# Patient Record
Sex: Male | Born: 1955 | Race: Black or African American | Hispanic: No | Smoking: Never smoker
Health system: Southern US, Community
[De-identification: ages and names within clinical notes are randomized; demographics above are authoritative.]

## PROBLEM LIST (undated history)

## (undated) DIAGNOSIS — K219 Gastro-esophageal reflux disease without esophagitis: Secondary | ICD-10-CM

## (undated) DIAGNOSIS — E119 Type 2 diabetes mellitus without complications: Secondary | ICD-10-CM

## (undated) DIAGNOSIS — I10 Essential (primary) hypertension: Secondary | ICD-10-CM

## (undated) DIAGNOSIS — I639 Cerebral infarction, unspecified: Secondary | ICD-10-CM

## (undated) HISTORY — DX: Type 2 diabetes mellitus without complications: E11.9

## (undated) HISTORY — DX: Gastro-esophageal reflux disease without esophagitis: K21.9

## (undated) HISTORY — DX: Essential (primary) hypertension: I10

---

## 2008-06-25 ENCOUNTER — Emergency Department: Payer: Self-pay | Admitting: Emergency Medicine

## 2015-10-20 DIAGNOSIS — I10 Essential (primary) hypertension: Secondary | ICD-10-CM | POA: Insufficient documentation

## 2016-09-07 IMAGING — US US RENAL
1 series · 14 of 25 positions shown · non-contrast
Comparison: None.

CLINICAL DATA: Patient with microscopic hematuria.

EXAM:
RENAL / URINARY TRACT ULTRASOUND COMPLETE

[Series 1: us renal · 0.26mm/px · 14 of 38 slices shown]
[im 1/38]
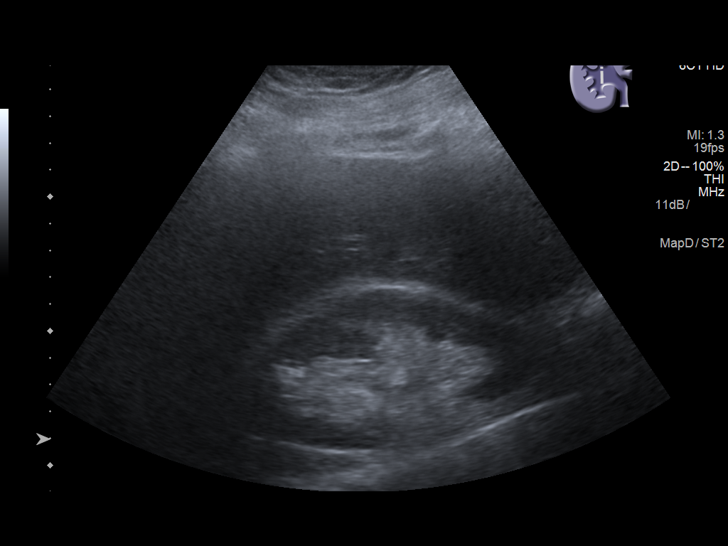
[im 4/38]
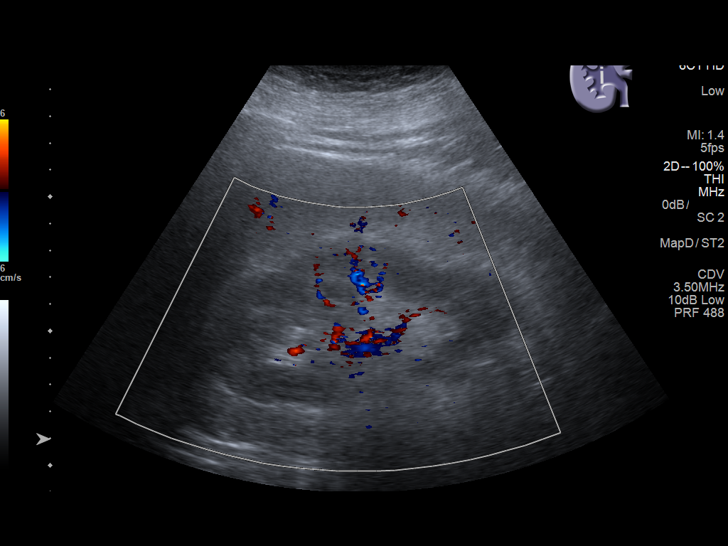
[im 7/38]
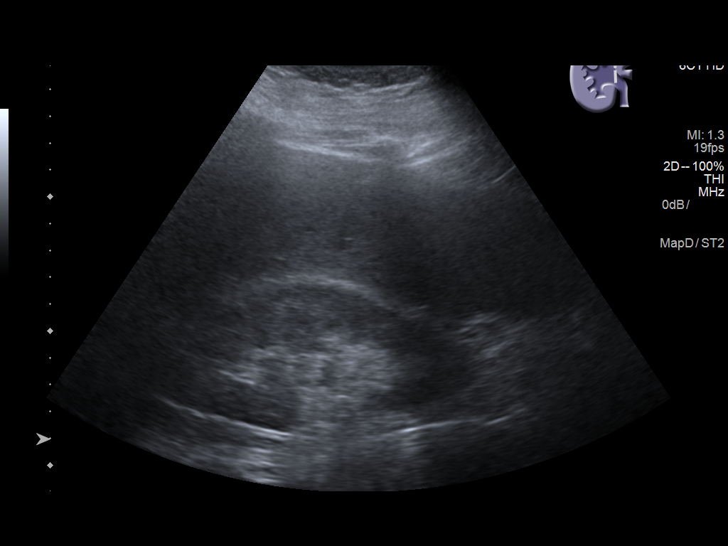
[im 10/38]
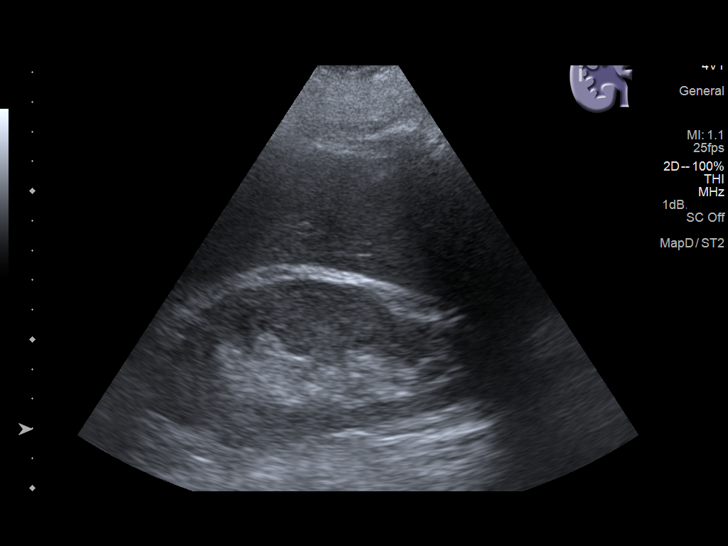
[im 13/38]
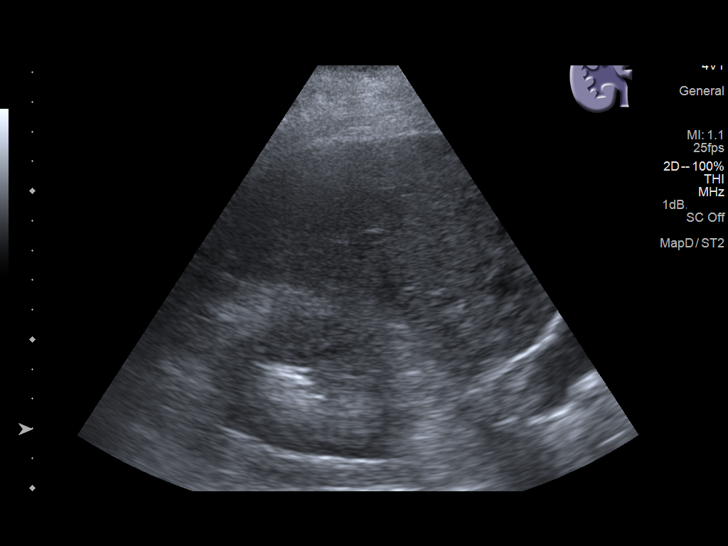
[im 14/38]
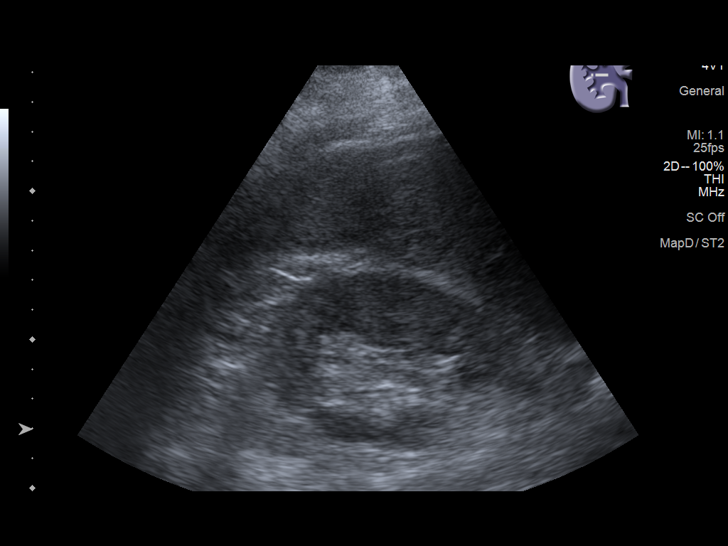
[im 17/38]
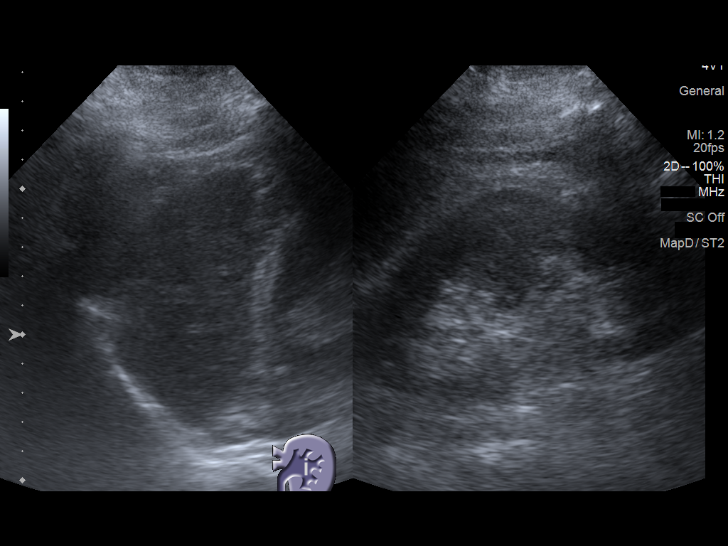
[im 21/38]
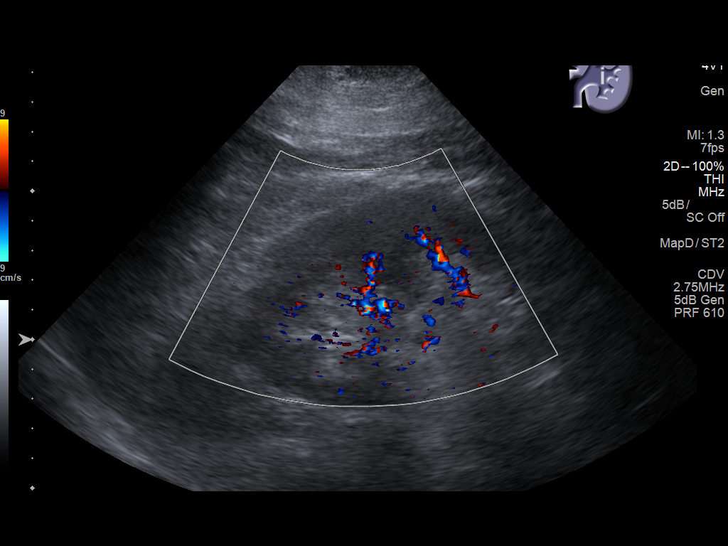
[im 24/38]
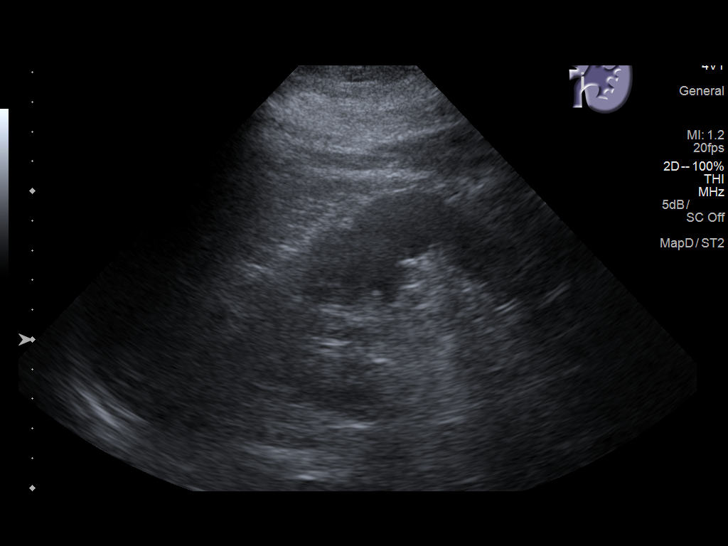
[im 25/38]
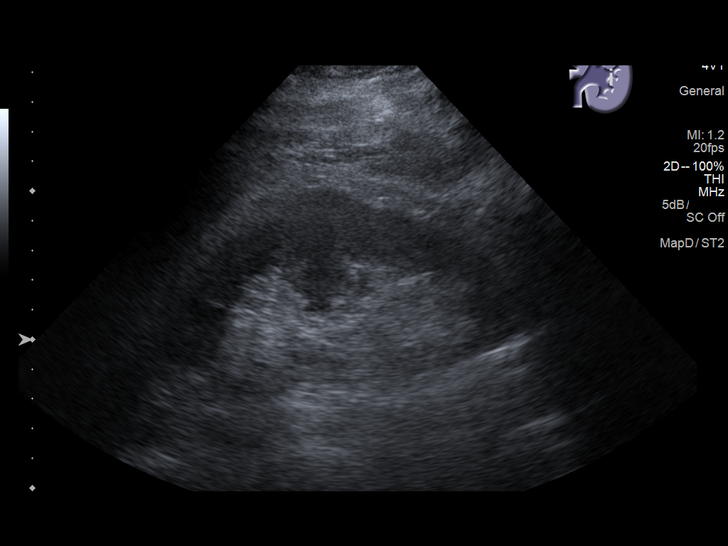
[im 28/38]
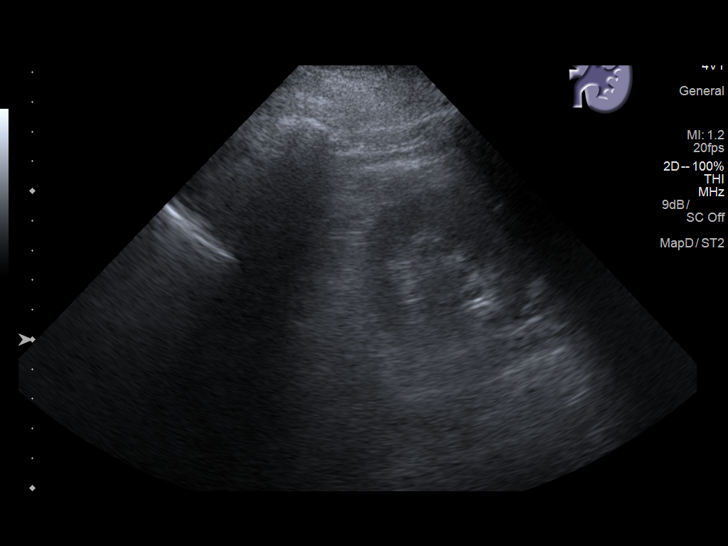
[im 31/38]
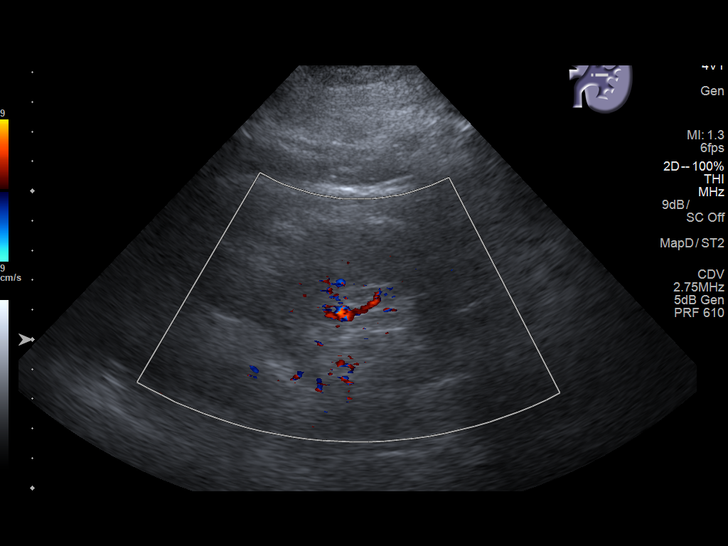
[im 34/38]
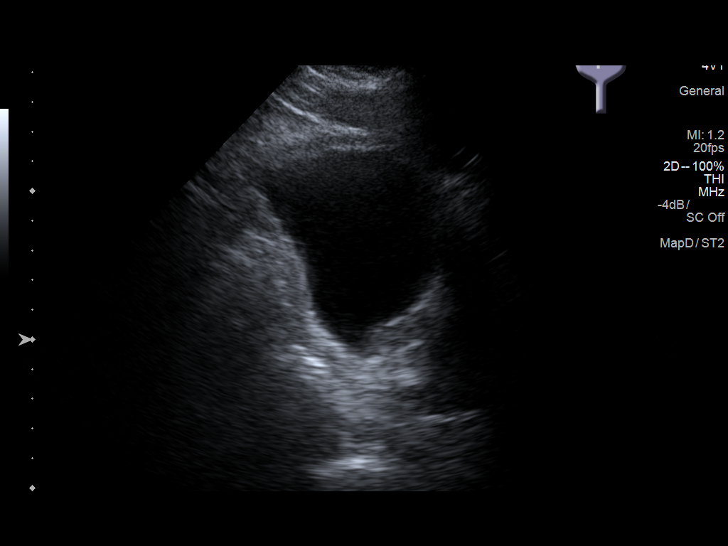
[im 38/38]
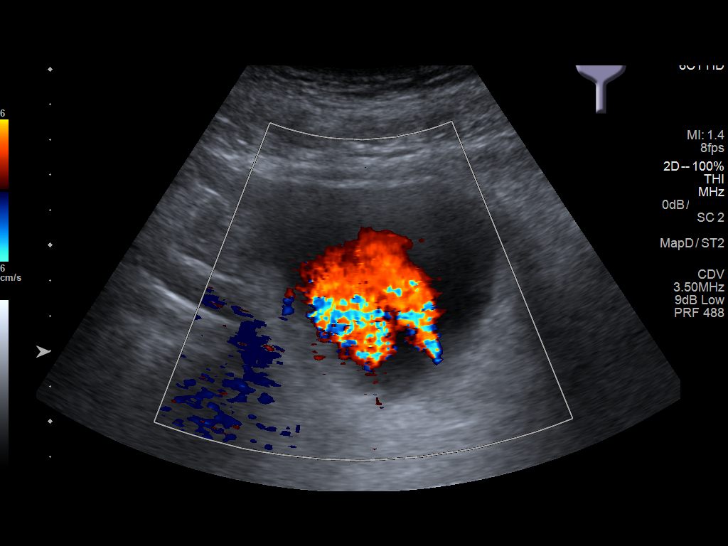

[14 of 25 positions shown; findings below may reference images not displayed]

FINDINGS: Right Kidney:

Length: 12.3 cm. Echogenicity within normal limits. No mass or
hydronephrosis visualized.

Left Kidney:

Length: 13.6 cm. Echogenicity within normal limits. No mass or
hydronephrosis visualized.

Bladder:

Appears normal for degree of bladder distention.
IMPRESSION: No hydronephrosis.

## 2016-11-28 DIAGNOSIS — E119 Type 2 diabetes mellitus without complications: Secondary | ICD-10-CM | POA: Insufficient documentation

## 2016-11-28 DIAGNOSIS — E1169 Type 2 diabetes mellitus with other specified complication: Secondary | ICD-10-CM | POA: Insufficient documentation

## 2016-12-07 ENCOUNTER — Ambulatory Visit: Payer: Self-pay

## 2016-12-10 ENCOUNTER — Ambulatory Visit (INDEPENDENT_AMBULATORY_CARE_PROVIDER_SITE_OTHER): Payer: PRIVATE HEALTH INSURANCE | Admitting: Urology

## 2016-12-10 ENCOUNTER — Encounter: Payer: Self-pay | Admitting: Urology

## 2016-12-10 VITALS — BP 157/94 | HR 84 | Ht 72.0 in | Wt 242.0 lb

## 2016-12-10 DIAGNOSIS — R972 Elevated prostate specific antigen [PSA]: Secondary | ICD-10-CM | POA: Diagnosis not present

## 2016-12-10 DIAGNOSIS — R3129 Other microscopic hematuria: Secondary | ICD-10-CM | POA: Insufficient documentation

## 2016-12-10 LAB — MICROSCOPIC EXAMINATION
Epithelial Cells (non renal): NONE SEEN /hpf (ref 0–10)
WBC UA: NONE SEEN /HPF (ref 0–?)

## 2016-12-10 LAB — URINALYSIS, COMPLETE
BILIRUBIN UA: NEGATIVE
GLUCOSE, UA: NEGATIVE
Leukocytes, UA: NEGATIVE
NITRITE UA: NEGATIVE
SPEC GRAV UA: 1.025 (ref 1.005–1.030)
UUROB: 1 mg/dL (ref 0.2–1.0)
pH, UA: 5 (ref 5.0–7.5)

## 2016-12-10 MED ORDER — TAMSULOSIN HCL 0.4 MG PO CAPS: 0.4000 mg | ORAL_CAPSULE | Freq: Every day | ORAL | 0 refills | Status: DC

## 2016-12-10 NOTE — Progress Notes (Signed)
12/10/2016 11:21 AM   Raymond Kerr Eyman Jul 04, 1955 [id number]  Referring provider: Barbette ReichmannHande, Vishwanath, MD [address] [facility] [place], [place] [id number]  Chief Complaint  Patient presents with  . Elevated PSA    HPI: Raymond StacksCurtis Gjerde is a 61 year old male AA seen in consultation at the request of Dr. Marcello FennelHande for evaluation of an elevated PSA.  A PSA drawn on 11/29/2016 was elevated at 5.52.  Previous PSA August 2017 was 2.50.  He denies previous history of urologic problems or prior urologic evaluation.  He has mild lower urinary tract symptoms consisting of rare urinary frequency, decreased stream and nocturia x1.  IPSS completed today was 3/35 with a QOL rated 2/6.  Denies dysuria or gross hematuria.  Denies flank, abdominal, pelvic or scrotal pain.  There is no family history of prostate cancer.  He rates the severity of his problem as mild.   PMH: Past Medical History:  Diagnosis Date  . Diabetes mellitus without complication (HCC)   . GERD (gastroesophageal reflux disease)   . Hypertension     Surgical History: No past surgical history on file.  Home Medications:  Allergies as of 12/10/2016   No Known Allergies     Medication List       Accurate as of 12/10/16 11:21 AM. Always use your most recent med list.          glimepiride 4 MG tablet Commonly known as:  AMARYL Take by mouth.   losartan 100 MG tablet Commonly known as:  COZAAR Take by mouth.   metFORMIN 1000 MG tablet Commonly known as:  GLUCOPHAGE Take by mouth.   omeprazole 20 MG capsule Commonly known as:  PRILOSEC Take by mouth.   tamsulosin 0.4 MG Caps capsule Commonly known as:  FLOMAX Take 1 capsule (0.4 mg total) by mouth daily.       Allergies: No Known Allergies  Family History: Family History  Problem Relation Age of Onset  . Prostate cancer Neg Hx   . Bladder Cancer Neg Hx   . Kidney cancer Neg Hx     Social History:  reports that he has never smoked. He has  never used smokeless tobacco. He reports that he drinks alcohol. He reports that he does not use drugs.  ROS: UROLOGY Frequent Urination?: Yes Hard to postpone urination?: No Burning/pain with urination?: No Get up at night to urinate?: Yes Leakage of urine?: No Urine stream starts and stops?: No Trouble starting stream?: No Do you have to strain to urinate?: No Blood in urine?: No Urinary tract infection?: No Sexually transmitted disease?: No Injury to kidneys or bladder?: No Painful intercourse?: No Weak stream?: No Erection problems?: No Penile pain?: Yes  Gastrointestinal Nausea?: No Vomiting?: No Indigestion/heartburn?: No Diarrhea?: No Constipation?: No  Constitutional Fever: No Night sweats?: No Weight loss?: No Fatigue?: No  Skin Skin rash/lesions?: No Itching?: No  Eyes Blurred vision?: No Double vision?: No  Ears/Nose/Throat Sore throat?: No Sinus problems?: No  Hematologic/Lymphatic Swollen glands?: No Easy bruising?: No  Cardiovascular Leg swelling?: No Chest pain?: No  Respiratory Cough?: No Shortness of breath?: No  Endocrine Excessive thirst?: No  Musculoskeletal Back pain?: No Joint pain?: No  Neurological Headaches?: No Dizziness?: No  Psychologic Depression?: No Anxiety?: No  Physical Exam: BP (!) 157/94 (BP Location: Right Arm, Patient Position: Sitting, Cuff Size: Normal)   Pulse 84   Ht 6' (1.829 m)   Wt 242 lb (109.8 kg)   BMI 32.82 kg/m  Constitutional:  Alert and oriented, No acute distress. HEENT: [place] AT, moist mucus membranes.  Trachea midline, no masses. Cardiovascular: No clubbing, cyanosis, or edema. Respiratory: Normal respiratory effort, no increased work of breathing. GI: Abdomen is soft, nontender, nondistended, no abdominal masses GU: No CVA tenderness.  Penis circumcised without lesions; testes descended bilaterally without masses or tenderness; cords/epididymes palpably normal.  No paratesticular  abnormalities.  He refused a prostate exam. Skin: No rashes, bruises or suspicious lesions. Lymph: No cervical or inguinal adenopathy. Neurologic: Grossly intact, no focal deficits, moving all 4 extremities. Psychiatric: Normal mood and affect.  Laboratory Data: PSA 11/29/2016 5.52  Urinalysis Negative glucose, trace ketones, 2+ blood, 2+ protein, nitrite negative, LEST negative Microscopy: 3-10 RBC/0 WBC Pertinent Imaging: N/A  Assessment & Plan:    1. Elevated PSA  Although PSA is a prostate cancer screening test he was informed that cancer is not the most common cause of an elevated PSA. Other potential causes including BPH and inflammation were discussed. He was informed that the only way to adequately diagnose prostate cancer would be a transrectal ultrasound and biopsy of the prostate. The procedure was discussed including potential risks of bleeding and infection/sepsis. He was also informed that a negative biopsy does not conclusively rule out the possibility that prostate cancer may be present and that continued monitoring is required. The use of newer adjunctive blood tests including PHI and 4kScore were discussed. The use of multiparametric prostate MRI was also discussed however is not typically used for initial evaluation of an elevated PSA. Continued periodic surveillance was also discussed.  Since his PSA was normal approximately 1 year ago I have recommended an empiric alpha blocker course with a follow-up PSA in approximately 1 month.  Rx tamsulosin 0.4 mg daily was sent to his pharmacy  Follow-up 5 weeks for review of results and he indicated he would have a DRE at that visit.     - Urinalysis, Complete - PSA; Future  2. Microhematuria  Urinalysis today showed 3-10 RBCs.  His recent urinalysis at [facility] was unremarkable.  He was informed urinalysis today remarkable for clinically significant microhematuria.  Have recommended a renal ultrasound and cystoscopy  for further evaluation.  - Ultrasound renal complete; Future   Return in about 5 weeks (around 01/14/2017).   I was informed by clinic staff when patient checked out he refused to schedule the renal ultrasound or a follow-up appointment and indicated he would call back for the appointment.    Riki Altes, MD  [facility] [address], [address] [place], [place] [phone]

## 2017-01-27 ENCOUNTER — Telehealth: Payer: Self-pay | Admitting: Urology

## 2017-01-27 NOTE — Telephone Encounter (Signed)
Scheduling has tried to reach this patient to schedule his RUS and he has not retuerned their calls. They have mailed him a letter to call and schedule. Just wanted you to know.  Marcelino DusterMichelle

## 2017-02-14 ENCOUNTER — Ambulatory Visit
Admission: RE | Admit: 2017-02-14 | Discharge: 2017-02-14 | Disposition: A | Discharge status: Home / Self Care | Payer: Self-pay | Source: Ambulatory Visit | Attending: Urology | Admitting: Urology

## 2017-02-14 DIAGNOSIS — R3129 Other microscopic hematuria: Secondary | ICD-10-CM | POA: Insufficient documentation

## 2017-02-16 ENCOUNTER — Telehealth: Payer: Self-pay

## 2017-02-16 NOTE — Telephone Encounter (Signed)
-----   Message from Riki AltesScott C Stoioff, MD sent at 02/15/2017  8:29 AM EST ----- Renal ultrasound was normal.  He has not had his PSA repeated and is due

## 2017-02-16 NOTE — Telephone Encounter (Signed)
Notified on vmail 

## 2019-06-04 ENCOUNTER — Ambulatory Visit: Payer: Self-pay

## 2019-06-04 ENCOUNTER — Ambulatory Visit: Payer: Self-pay | Attending: Internal Medicine

## 2019-06-04 DIAGNOSIS — Z23 Encounter for immunization: Secondary | ICD-10-CM

## 2019-06-04 NOTE — Progress Notes (Signed)
   Covid-19 Vaccination Clinic  Name:  Raymond Kerr    MRN: [id number] DOB: [birth year]  06/04/2019  Raymond Kerr was observed post Covid-19 immunization for 15 minutes without incident. He was provided with Vaccine Information Sheet and instruction to access the V-Safe system.   Raymond Kerr was instructed to call 911 with any severe reactions post vaccine: Marland Kitchen Difficulty breathing  . Swelling of face and throat  . A fast heartbeat  . A bad rash all over body  . Dizziness and weakness   Immunizations Administered    Name Date Dose VIS Date Route   Pfizer COVID-19 Vaccine 06/04/2019 10:23 AM 0.3 mL 02/02/2019 Intramuscular   Manufacturer: [employer], [employer]   Lot: [id number]   NDC: [address]-9

## 2019-06-26 ENCOUNTER — Ambulatory Visit: Payer: Self-pay | Attending: Internal Medicine

## 2019-06-26 DIAGNOSIS — Z23 Encounter for immunization: Secondary | ICD-10-CM

## 2019-06-26 NOTE — Progress Notes (Signed)
   Covid-19 Vaccination Clinic  Name:  Raymond Kerr    MRN: [id number] DOB: [birth year]  06/26/2019  Mr. Raymond Kerr was observed post Covid-19 immunization for 15 minutes without incident. He was provided with Vaccine Information Sheet and instruction to access the V-Safe system.   Mr. Raymond Kerr was instructed to call 911 with any severe reactions post vaccine: Marland Kitchen Difficulty breathing  . Swelling of face and throat  . A fast heartbeat  . A bad rash all over body  . Dizziness and weakness   Immunizations Administered    Name Date Dose VIS Date Route   Pfizer COVID-19 Vaccine 06/26/2019  2:00 PM 0.3 mL 04/18/2018 Intramuscular   Manufacturer: [employer], [employer]   Lot: [id number]   NDC: [address]-0

## 2020-10-05 ENCOUNTER — Emergency Department: Payer: Medicaid Other

## 2020-10-05 ENCOUNTER — Encounter: Payer: Self-pay | Admitting: Emergency Medicine

## 2020-10-05 ENCOUNTER — Other Ambulatory Visit: Payer: Self-pay

## 2020-10-05 DIAGNOSIS — I214 Non-ST elevation (NSTEMI) myocardial infarction: Secondary | ICD-10-CM | POA: Diagnosis present

## 2020-10-05 DIAGNOSIS — R471 Dysarthria and anarthria: Secondary | ICD-10-CM | POA: Diagnosis present

## 2020-10-05 DIAGNOSIS — E1165 Type 2 diabetes mellitus with hyperglycemia: Secondary | ICD-10-CM | POA: Diagnosis present

## 2020-10-05 DIAGNOSIS — E785 Hyperlipidemia, unspecified: Secondary | ICD-10-CM | POA: Diagnosis present

## 2020-10-05 DIAGNOSIS — R29810 Facial weakness: Secondary | ICD-10-CM | POA: Diagnosis present

## 2020-10-05 DIAGNOSIS — I63442 Cerebral infarction due to embolism of left cerebellar artery: Principal | ICD-10-CM | POA: Diagnosis present

## 2020-10-05 DIAGNOSIS — I10 Essential (primary) hypertension: Secondary | ICD-10-CM | POA: Diagnosis present

## 2020-10-05 DIAGNOSIS — K219 Gastro-esophageal reflux disease without esophagitis: Secondary | ICD-10-CM | POA: Diagnosis present

## 2020-10-05 DIAGNOSIS — E861 Hypovolemia: Secondary | ICD-10-CM | POA: Diagnosis not present

## 2020-10-05 DIAGNOSIS — R1312 Dysphagia, oropharyngeal phase: Secondary | ICD-10-CM | POA: Diagnosis present

## 2020-10-05 DIAGNOSIS — R4701 Aphasia: Secondary | ICD-10-CM | POA: Diagnosis present

## 2020-10-05 DIAGNOSIS — R29705 NIHSS score 5: Secondary | ICD-10-CM | POA: Diagnosis present

## 2020-10-05 DIAGNOSIS — Z20822 Contact with and (suspected) exposure to covid-19: Secondary | ICD-10-CM | POA: Diagnosis present

## 2020-10-05 DIAGNOSIS — G8194 Hemiplegia, unspecified affecting left nondominant side: Secondary | ICD-10-CM | POA: Diagnosis present

## 2020-10-05 DIAGNOSIS — H02402 Unspecified ptosis of left eyelid: Secondary | ICD-10-CM | POA: Diagnosis present

## 2020-10-05 DIAGNOSIS — H4902 Third [oculomotor] nerve palsy, left eye: Secondary | ICD-10-CM | POA: Diagnosis present

## 2020-10-05 DIAGNOSIS — E871 Hypo-osmolality and hyponatremia: Secondary | ICD-10-CM | POA: Diagnosis not present

## 2020-10-05 DIAGNOSIS — E876 Hypokalemia: Secondary | ICD-10-CM | POA: Diagnosis present

## 2020-10-05 DIAGNOSIS — L89152 Pressure ulcer of sacral region, stage 2: Secondary | ICD-10-CM | POA: Diagnosis present

## 2020-10-05 DIAGNOSIS — I959 Hypotension, unspecified: Secondary | ICD-10-CM | POA: Diagnosis present

## 2020-10-05 LAB — DIFFERENTIAL
Abs Immature Granulocytes: 0.02 10*3/uL (ref 0.00–0.07)
Basophils Absolute: 0 10*3/uL (ref 0.0–0.1)
Basophils Relative: 0 %
Eosinophils Absolute: 0.1 10*3/uL (ref 0.0–0.5)
Eosinophils Relative: 2 %
Immature Granulocytes: 0 %
Lymphocytes Relative: 36 %
Lymphs Abs: 1.9 10*3/uL (ref 0.7–4.0)
Monocytes Absolute: 0.3 10*3/uL (ref 0.1–1.0)
Monocytes Relative: 6 %
Neutro Abs: 2.9 10*3/uL (ref 1.7–7.7)
Neutrophils Relative %: 56 %

## 2020-10-05 LAB — CBG MONITORING, ED: Glucose-Capillary: 303 mg/dL — ABNORMAL HIGH (ref 70–99)

## 2020-10-05 LAB — COMPREHENSIVE METABOLIC PANEL
ALT: 14 U/L (ref 0–44)
AST: 27 U/L (ref 15–41)
Albumin: 3.3 g/dL — ABNORMAL LOW (ref 3.5–5.0)
Alkaline Phosphatase: 70 U/L (ref 38–126)
Anion gap: 8 (ref 5–15)
BUN: 14 mg/dL (ref 8–23)
CO2: 27 mmol/L (ref 22–32)
Calcium: 9.1 mg/dL (ref 8.9–10.3)
Chloride: 100 mmol/L (ref 98–111)
Creatinine, Ser: 1.07 mg/dL (ref 0.61–1.24)
GFR, Estimated: >60 mL/min (ref >60)
Glucose, Bld: 260 mg/dL — ABNORMAL HIGH (ref 70–99)
Potassium: 3.7 mmol/L (ref 3.5–5.1)
Sodium: 135 mmol/L (ref 135–145)
Total Bilirubin: 0.6 mg/dL (ref 0.3–1.2)
Total Protein: 7.4 g/dL (ref 6.5–8.1)

## 2020-10-05 LAB — CBC
HCT: 40.5 % (ref 39.0–52.0)
Hemoglobin: 13.2 g/dL (ref 13.0–17.0)
MCH: 29.1 pg (ref 26.0–34.0)
MCHC: 32.6 g/dL (ref 30.0–36.0)
MCV: 89.4 fL (ref 80.0–100.0)
Platelets: 277 10*3/uL (ref 150–400)
RBC: 4.53 MIL/uL (ref 4.22–5.81)
RDW: 14.3 % (ref 11.5–15.5)
WBC: 5.3 10*3/uL (ref 4.0–10.5)
nRBC: 0 % (ref 0.0–0.2)

## 2020-10-05 LAB — APTT: aPTT: 29 seconds (ref 24–36)

## 2020-10-05 LAB — PROTIME-INR
INR: 1 (ref 0.8–1.2)
Prothrombin Time: 13 seconds (ref 11.4–15.2)

## 2020-10-05 LAB — TROPONIN I (HIGH SENSITIVITY): Troponin I (High Sensitivity): 62 ng/L — ABNORMAL HIGH (ref <18)

## 2020-10-05 IMAGING — CT CT HEAD W/O CM
3 of 4 series · 15 of 47 positions shown, 18 images · non-contrast
Comparison: None.

CLINICAL DATA: Neuro deficit, acute, stroke suspected. Slurred
speech and double vision for the past 8 hours.

EXAM:
CT HEAD WITHOUT CONTRAST
TECHNIQUE: Contiguous axial images were obtained from the base of the skull
through the vertex without intravenous contrast.

[Series 2: head wo · axial · 0.47mm/px · z∈[-189,-39]mm · 9 of 36 slices shown, 12 images]
[im 3/36  brain]
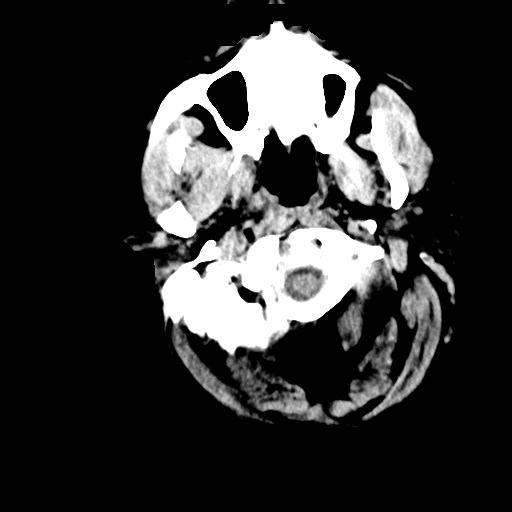
[im 3/36  bone]
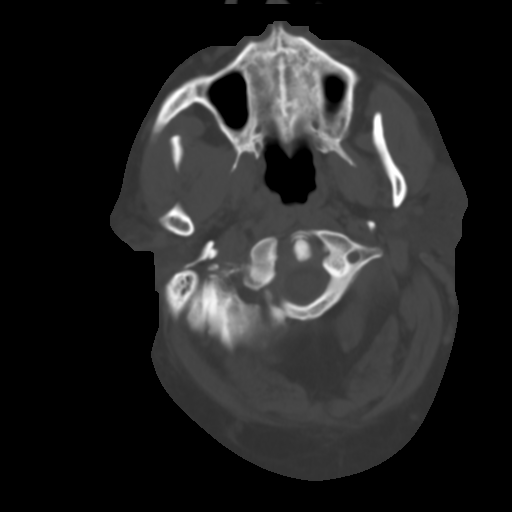
[im 8/36  brain]
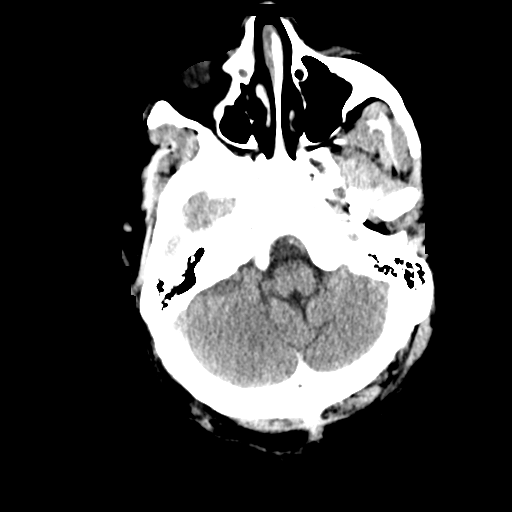
[im 11/36  brain]
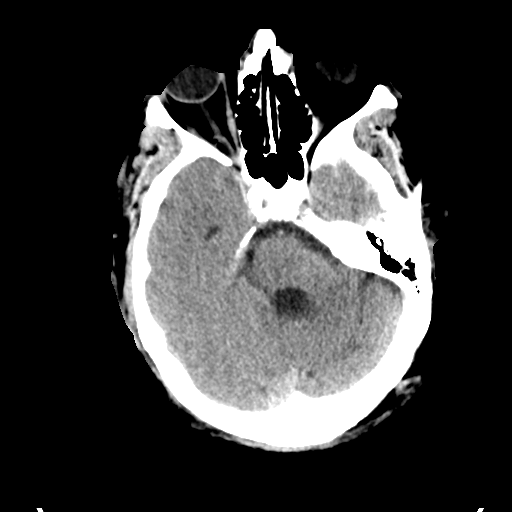
[im 16/36  brain]
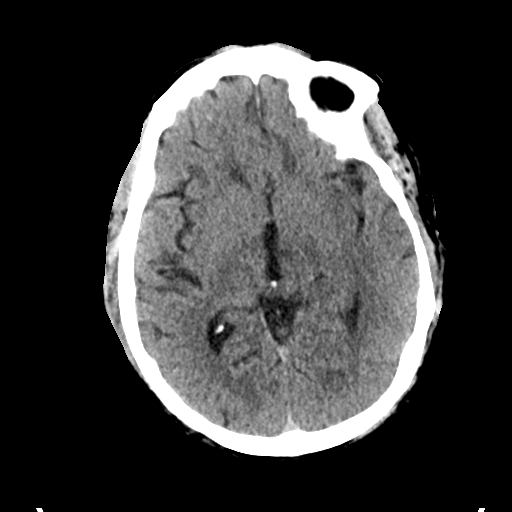
[im 18/36  brain]
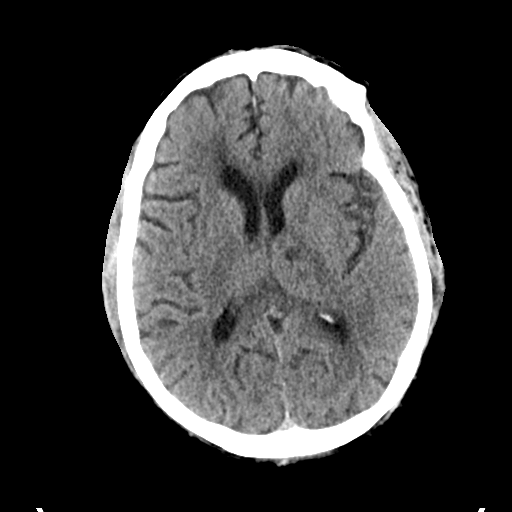
[im 18/36  bone]
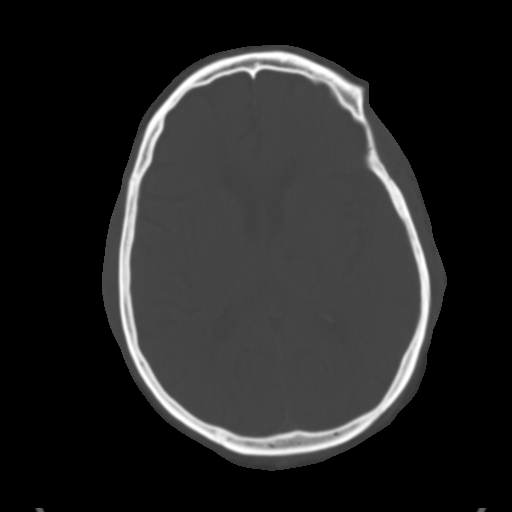
[im 21/36  brain]
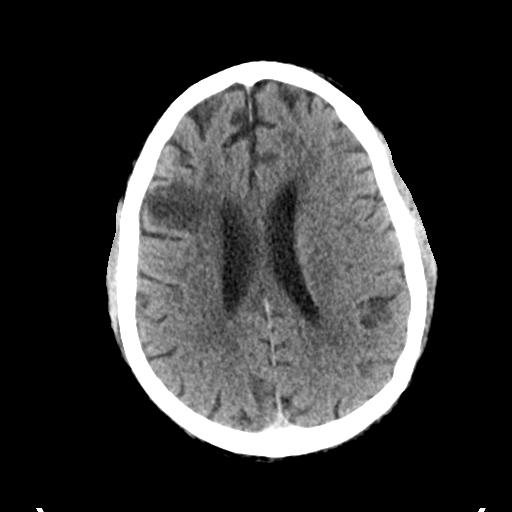
[im 26/36  brain]
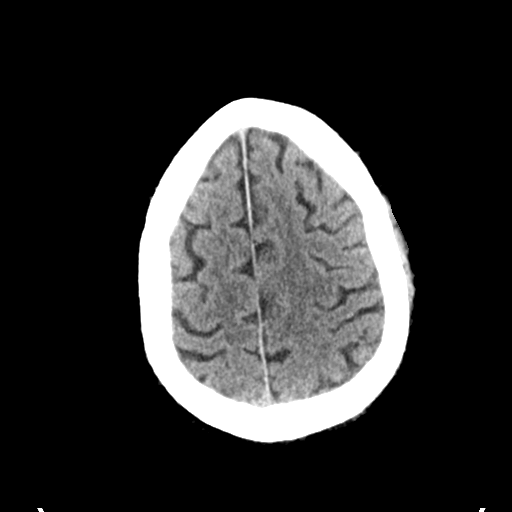
[im 28/36  brain]
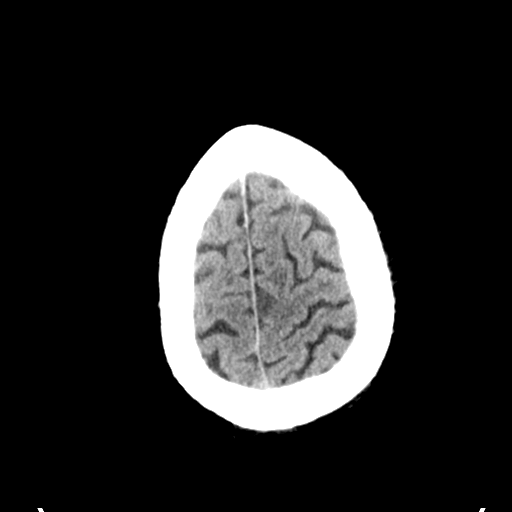
[im 33/36  brain]
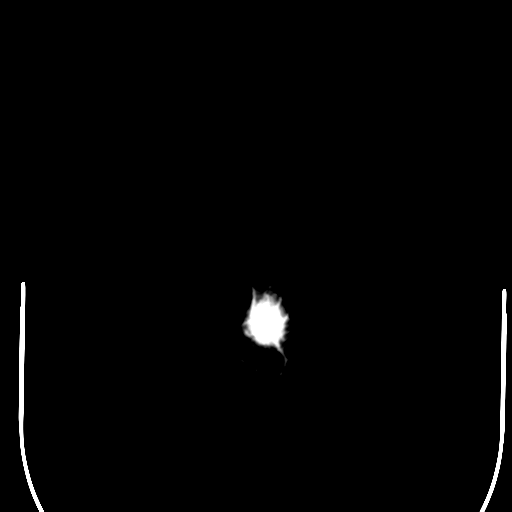
[im 33/36  bone]
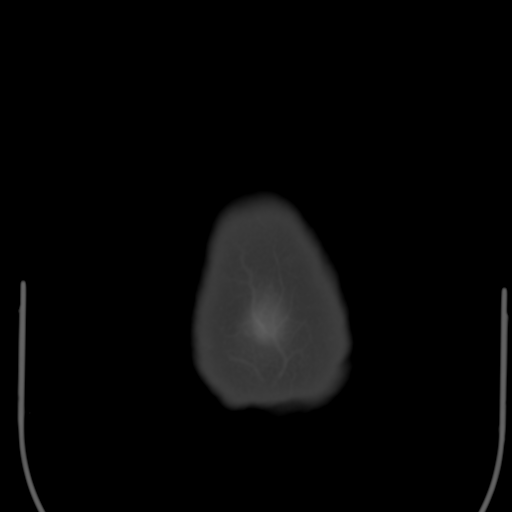

[Series 4: coronal soft tissue · coronal · 0.37mm/px · 3 of 84 slices shown]
[im 28/84  brain]
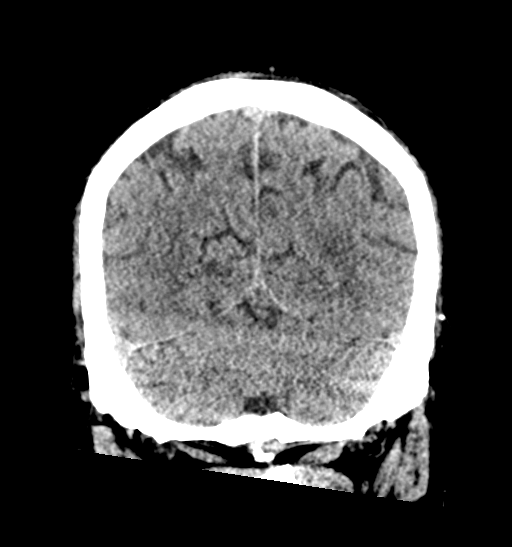
[im 37/84  brain]
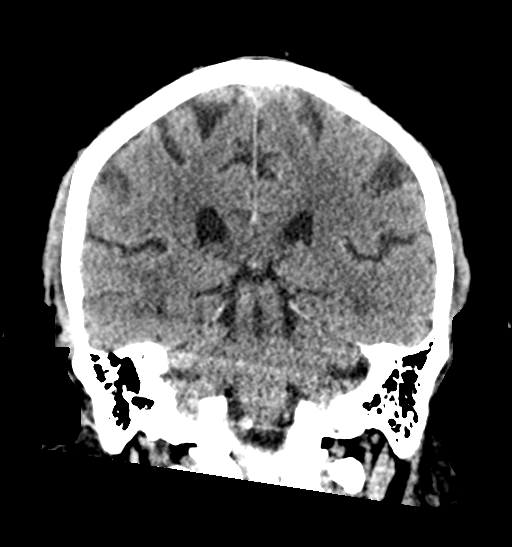
[im 47/84  brain]
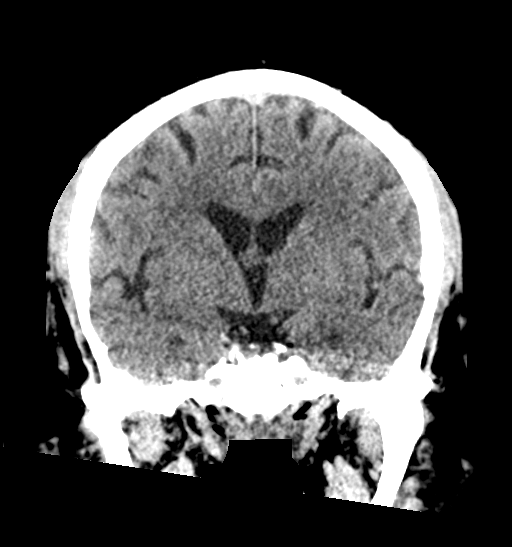

[Series 5: sagittal soft tissue · sagittal · 0.34mm/px · 3 of 65 slices shown]
[im 26/65  brain]
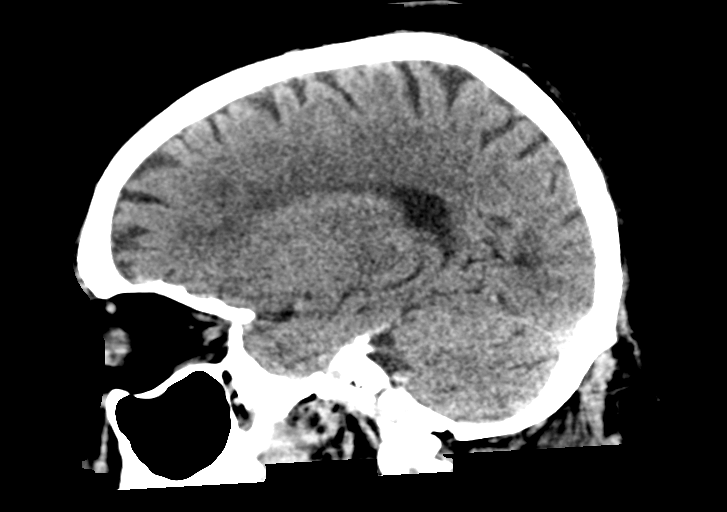
[im 33/65  brain]
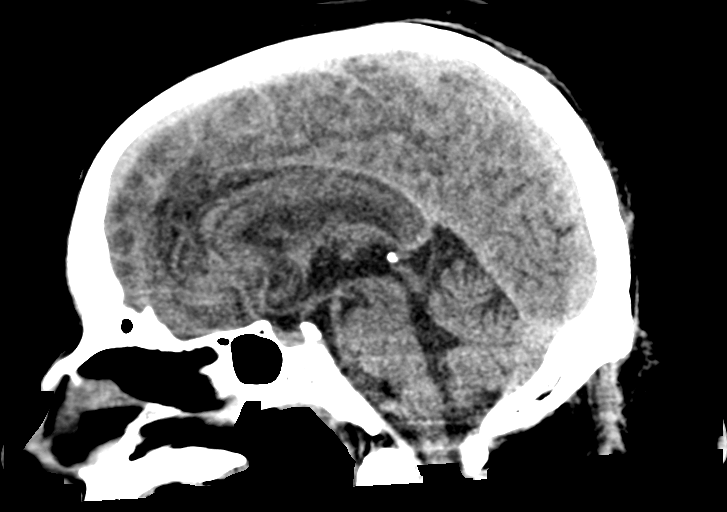
[im 39/65  brain]
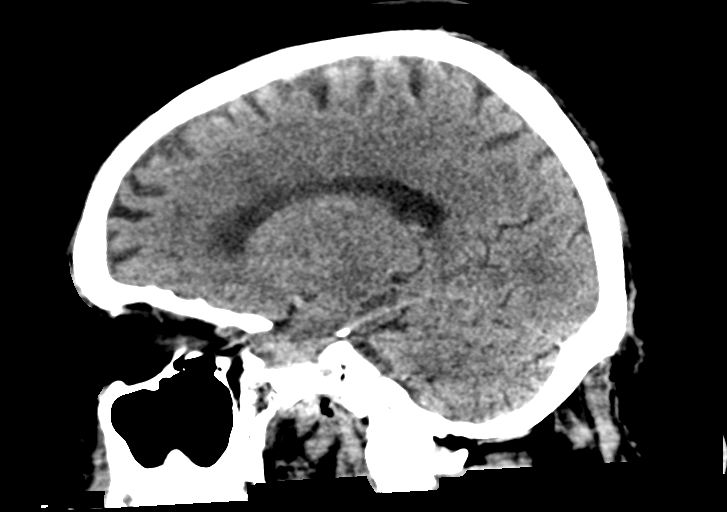

[15 of 47 positions shown; findings below may reference images not displayed]

FINDINGS: Brain: Chronic small vessel ischemic changes within the bilateral
periventricular white matter regions. Small chronic-appearing
infarct within the LEFT cerebellum.

Additional low-density areas within the LEFT thalamus and RIGHT
frontal lobe, compatible with subacute to chronic infarcts, favor
chronic.

No parenchymal mass or hemorrhage. No mass effect, midline shift or
herniation seen.

Vascular: Chronic calcified atherosclerotic changes of the large
vessels at the skull base. No unexpected hyperdense vessel.

Skull: Normal. Negative for fracture or focal lesion.

Sinuses/Orbits: No acute finding.

Other: None.
IMPRESSION: 1. Low-density areas within the LEFT thalamus and RIGHT frontal
lobe, compatible with subacute to chronic infarcts, favor chronic.
2. Additional chronic ischemic changes within the white matter
regions and LEFT cerebellum.
3. No intracranial hemorrhage. No significant mass effect, midline
shift or herniation.

## 2020-10-05 NOTE — ED Notes (Signed)
D/w Dr. Scotty Court pt's CT results no additional nursing orders at this time.

## 2020-10-05 NOTE — ED Triage Notes (Addendum)
Pt arrived via POV from [place] with a friend, reports pt has had slurred speech and double vision for the past 8 hours while traveling, friend who drove the patient here states they stopped at a hotel and called 911 who checked the patient's blood sugar.  On arrival, pt reports double vision and dizziness.  Pt is a diabetic CBG 303.   Denies any pain, speech is somewhat slurred. No aphasia noted, bilateral equal grip strengths.   Pt is weak as well.   Pt's friend who brought him said the patient was losing his vision, pt denies this at this time.   Denies any HA, not on any blood thinners.

## 2020-10-06 ENCOUNTER — Emergency Department: Payer: Medicaid Other

## 2020-10-06 ENCOUNTER — Inpatient Hospital Stay
Admission: EM | Admit: 2020-10-06 | Discharge: 2020-10-24 | DRG: 064 | Disposition: A | Discharge status: Home / Self Care | Payer: Medicaid Other | Attending: Internal Medicine | Admitting: Internal Medicine

## 2020-10-06 ENCOUNTER — Encounter: Payer: Self-pay | Admitting: Family Medicine

## 2020-10-06 ENCOUNTER — Observation Stay
Admit: 2020-10-06 | Discharge: 2020-10-06 | Disposition: A | Discharge status: Home / Self Care | Payer: Medicaid Other | Attending: Family Medicine | Admitting: Family Medicine

## 2020-10-06 DIAGNOSIS — I959 Hypotension, unspecified: Secondary | ICD-10-CM

## 2020-10-06 DIAGNOSIS — E1165 Type 2 diabetes mellitus with hyperglycemia: Secondary | ICD-10-CM

## 2020-10-06 DIAGNOSIS — G459 Transient cerebral ischemic attack, unspecified: Secondary | ICD-10-CM

## 2020-10-06 DIAGNOSIS — L8990 Pressure ulcer of unspecified site, unspecified stage: Secondary | ICD-10-CM | POA: Insufficient documentation

## 2020-10-06 DIAGNOSIS — I10 Essential (primary) hypertension: Secondary | ICD-10-CM

## 2020-10-06 DIAGNOSIS — I63522 Cerebral infarction due to unspecified occlusion or stenosis of left anterior cerebral artery: Secondary | ICD-10-CM

## 2020-10-06 DIAGNOSIS — I639 Cerebral infarction, unspecified: Secondary | ICD-10-CM

## 2020-10-06 DIAGNOSIS — I63542 Cerebral infarction due to unspecified occlusion or stenosis of left cerebellar artery: Secondary | ICD-10-CM | POA: Diagnosis present

## 2020-10-06 DIAGNOSIS — H4902 Third [oculomotor] nerve palsy, left eye: Secondary | ICD-10-CM

## 2020-10-06 DIAGNOSIS — R4702 Dysphasia: Secondary | ICD-10-CM

## 2020-10-06 DIAGNOSIS — R531 Weakness: Secondary | ICD-10-CM

## 2020-10-06 DIAGNOSIS — I214 Non-ST elevation (NSTEMI) myocardial infarction: Secondary | ICD-10-CM

## 2020-10-06 DIAGNOSIS — R778 Other specified abnormalities of plasma proteins: Secondary | ICD-10-CM

## 2020-10-06 DIAGNOSIS — R739 Hyperglycemia, unspecified: Secondary | ICD-10-CM

## 2020-10-06 DIAGNOSIS — E876 Hypokalemia: Secondary | ICD-10-CM

## 2020-10-06 LAB — LIPID PANEL
Cholesterol: 199 mg/dL (ref 0–200)
Cholesterol: 174 mg/dL (ref 0–200)
HDL: 35 mg/dL — ABNORMAL LOW (ref >40)
HDL: 30 mg/dL — ABNORMAL LOW (ref >40)
LDL Cholesterol: 136 mg/dL — ABNORMAL HIGH (ref 0–99)
LDL Cholesterol: 124 mg/dL — ABNORMAL HIGH (ref 0–99)
Total CHOL/HDL Ratio: 5.7 RATIO
Total CHOL/HDL Ratio: 5.8 RATIO
Triglycerides: 138 mg/dL (ref <150)
Triglycerides: 102 mg/dL (ref <150)
VLDL: 28 mg/dL (ref 0–40)
VLDL: 20 mg/dL (ref 0–40)

## 2020-10-06 LAB — CBC
HCT: 30.9 % — ABNORMAL LOW (ref 39.0–52.0)
Hemoglobin: 10.4 g/dL — ABNORMAL LOW (ref 13.0–17.0)
MCH: 30.5 pg (ref 26.0–34.0)
MCHC: 33.7 g/dL (ref 30.0–36.0)
MCV: 90.6 fL (ref 80.0–100.0)
Platelets: 265 10*3/uL (ref 150–400)
RBC: 3.41 MIL/uL — ABNORMAL LOW (ref 4.22–5.81)
RDW: 14.4 % (ref 11.5–15.5)
WBC: 6.1 10*3/uL (ref 4.0–10.5)
nRBC: 0 % (ref 0.0–0.2)

## 2020-10-06 LAB — URINALYSIS, ROUTINE W REFLEX MICROSCOPIC
Bacteria, UA: NONE SEEN
Bilirubin Urine: NEGATIVE
Glucose, UA: 50 mg/dL — AB
Ketones, ur: NEGATIVE mg/dL
Leukocytes,Ua: NEGATIVE
Nitrite: NEGATIVE
Protein, ur: 100 mg/dL — AB
Specific Gravity, Urine: 1.013 (ref 1.005–1.030)
Squamous Epithelial / HPF: NONE SEEN (ref 0–5)
pH: 5 (ref 5.0–8.0)

## 2020-10-06 LAB — BASIC METABOLIC PANEL
Anion gap: 9 (ref 5–15)
BUN: 13 mg/dL (ref 8–23)
CO2: 26 mmol/L (ref 22–32)
Calcium: 8.5 mg/dL — ABNORMAL LOW (ref 8.9–10.3)
Chloride: 101 mmol/L (ref 98–111)
Creatinine, Ser: 1.09 mg/dL (ref 0.61–1.24)
GFR, Estimated: >60 mL/min (ref >60)
Glucose, Bld: 167 mg/dL — ABNORMAL HIGH (ref 70–99)
Potassium: 3.2 mmol/L — ABNORMAL LOW (ref 3.5–5.1)
Sodium: 136 mmol/L (ref 135–145)

## 2020-10-06 LAB — URINE DRUG SCREEN, QUALITATIVE (ARMC ONLY)
Amphetamines, Ur Screen: NOT DETECTED
Barbiturates, Ur Screen: NOT DETECTED
Benzodiazepine, Ur Scrn: NOT DETECTED
Cannabinoid 50 Ng, Ur ~~LOC~~: NOT DETECTED
Cocaine Metabolite,Ur ~~LOC~~: NOT DETECTED
MDMA (Ecstasy)Ur Screen: NOT DETECTED
Methadone Scn, Ur: NOT DETECTED
Opiate, Ur Screen: NOT DETECTED
Phencyclidine (PCP) Ur S: NOT DETECTED
Tricyclic, Ur Screen: NOT DETECTED

## 2020-10-06 LAB — HEMOGLOBIN A1C
Hgb A1c MFr Bld: 10.3 % — ABNORMAL HIGH (ref 4.8–5.6)
Hgb A1c MFr Bld: 10.1 % — ABNORMAL HIGH (ref 4.8–5.6)
Mean Plasma Glucose: 248.91 mg/dL
Mean Plasma Glucose: 243.17 mg/dL

## 2020-10-06 LAB — ECHOCARDIOGRAM COMPLETE: S' Lateral: 3.4 cm

## 2020-10-06 LAB — GLUCOSE, CAPILLARY: Glucose-Capillary: 210 mg/dL — ABNORMAL HIGH (ref 70–99)

## 2020-10-06 LAB — TROPONIN I (HIGH SENSITIVITY)
Troponin I (High Sensitivity): 68 ng/L — ABNORMAL HIGH (ref <18)
Troponin I (High Sensitivity): 55 ng/L — ABNORMAL HIGH (ref <18)

## 2020-10-06 LAB — HIV ANTIBODY (ROUTINE TESTING W REFLEX): HIV Screen 4th Generation wRfx: NONREACTIVE

## 2020-10-06 LAB — CBG MONITORING, ED
Glucose-Capillary: 214 mg/dL — ABNORMAL HIGH (ref 70–99)
Glucose-Capillary: 134 mg/dL — ABNORMAL HIGH (ref 70–99)
Glucose-Capillary: 142 mg/dL — ABNORMAL HIGH (ref 70–99)
Glucose-Capillary: 192 mg/dL — ABNORMAL HIGH (ref 70–99)
Glucose-Capillary: 187 mg/dL — ABNORMAL HIGH (ref 70–99)

## 2020-10-06 LAB — RESP PANEL BY RT-PCR (FLU A&B, COVID) ARPGX2
Influenza A by PCR: NEGATIVE
Influenza B by PCR: NEGATIVE
SARS Coronavirus 2 by RT PCR: NEGATIVE

## 2020-10-06 LAB — ETHANOL: Alcohol, Ethyl (B): <10 mg/dL (ref <10)

## 2020-10-06 IMAGING — MR MR MRA HEAD W/O CM
1 series · 18 of 48 positions shown · non-contrast
Comparison: None.

CLINICAL DATA: Seizure, slurred speech and double vision.



[Series 1: TOF · axial · 0.5mm · 0.41mm/px · z∈[-106,-0]mm · 18 of 224 slices shown]
[im 1/224]
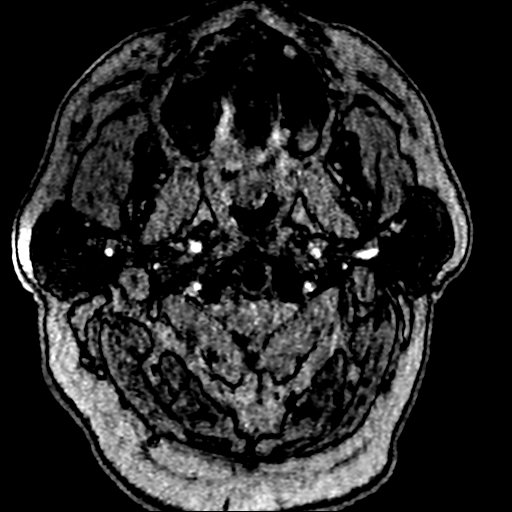
[im 5/224]
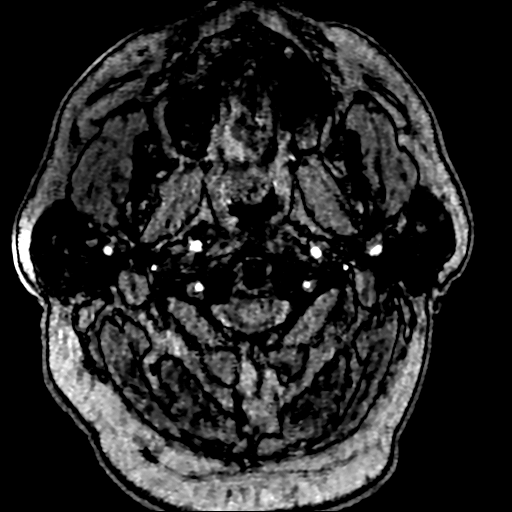
[im 10/224]
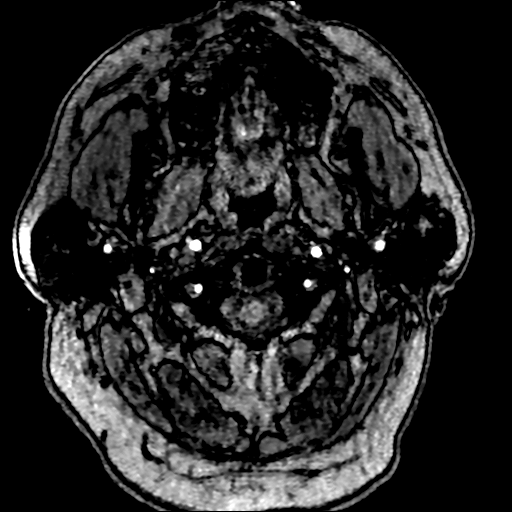
[im 15/224]
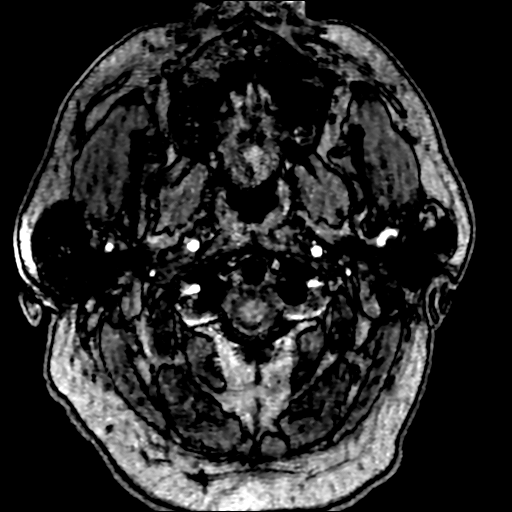
[im 19/224]
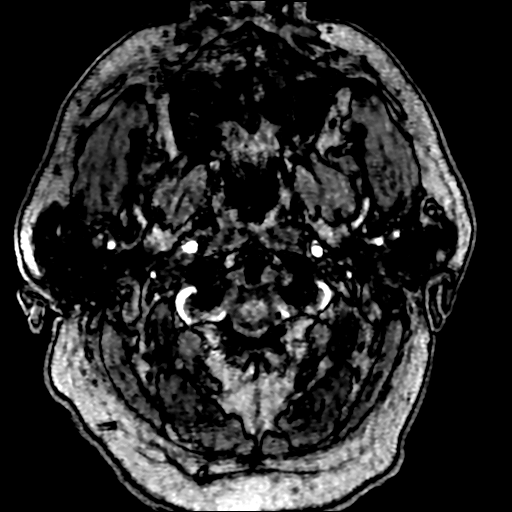
[im 24/224]
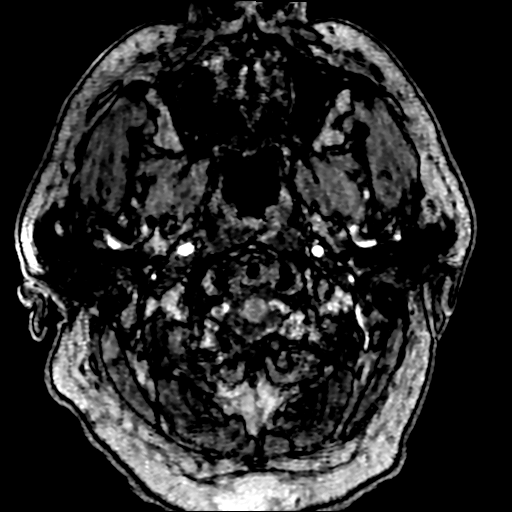
[im 29/224]
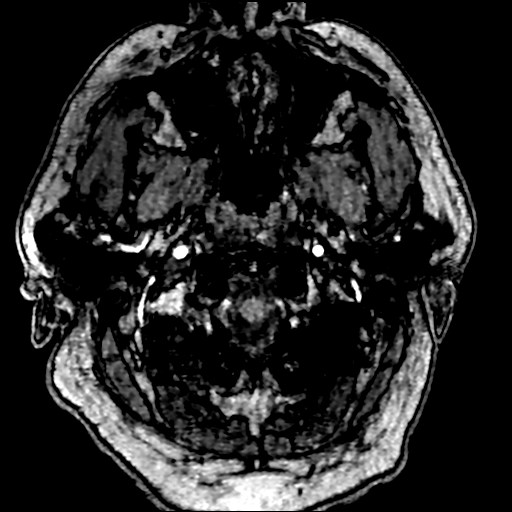
[im 34/224]
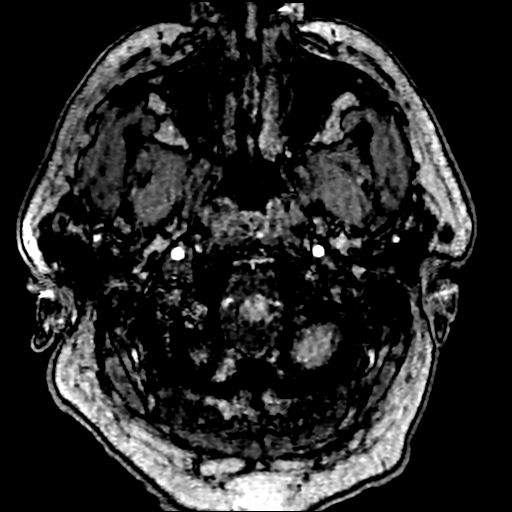
[im 38/224]
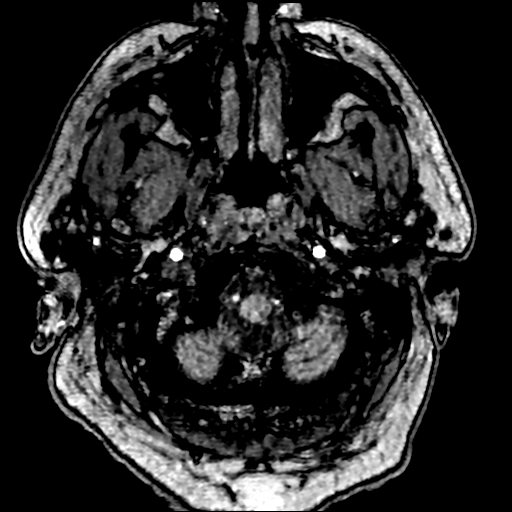
[im 43/224]
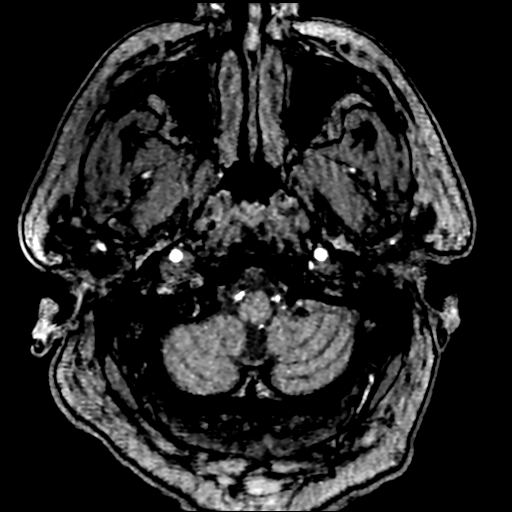
[im 72/224]
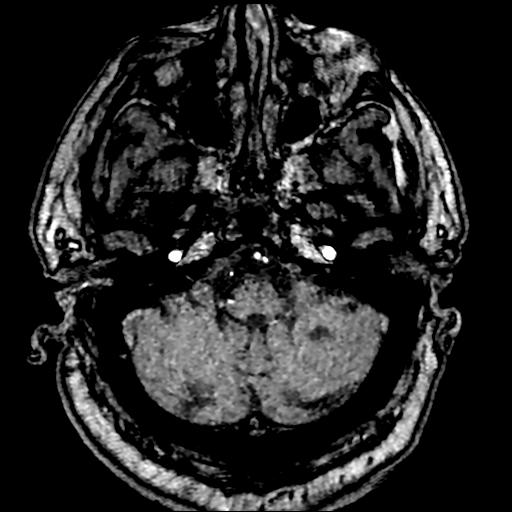
[im 100/224]
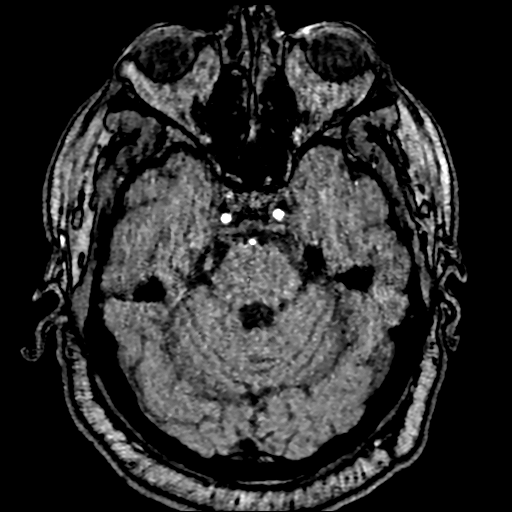
[im 114/224]
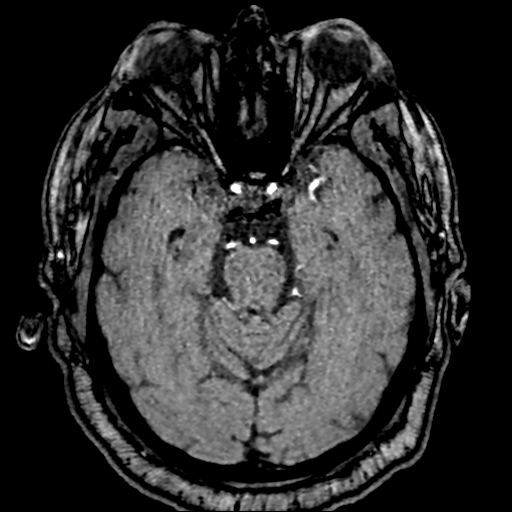
[im 129/224]
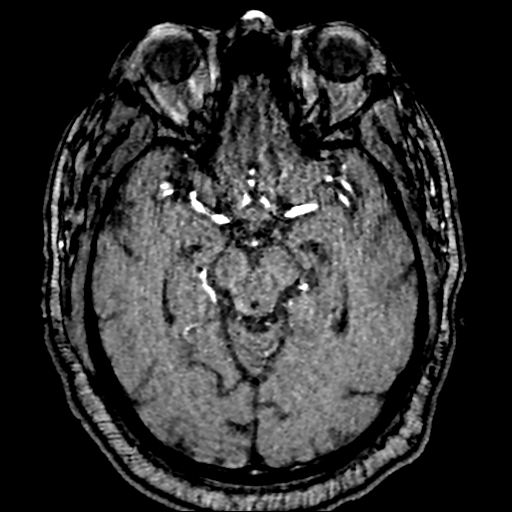
[im 157/224]
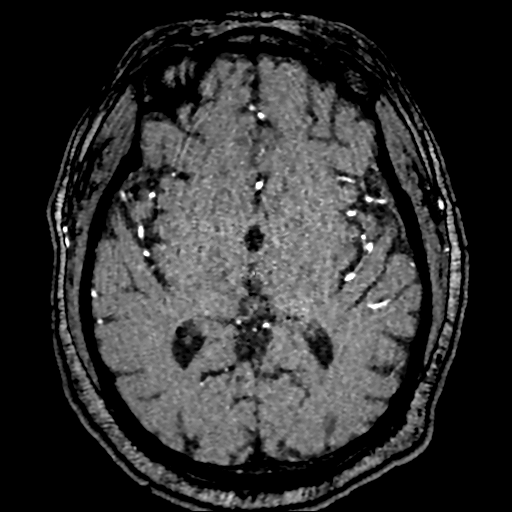
[im 186/224]
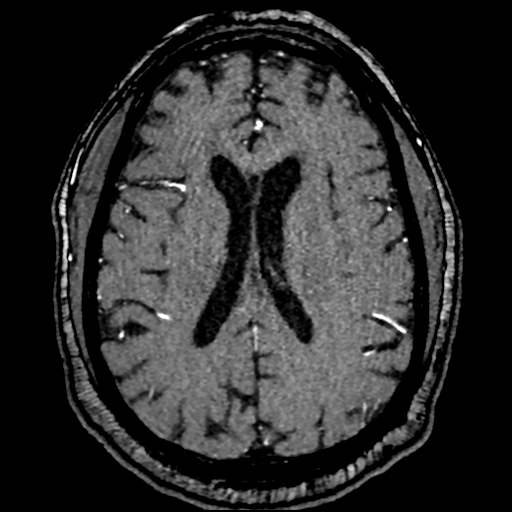
[im 190/224]
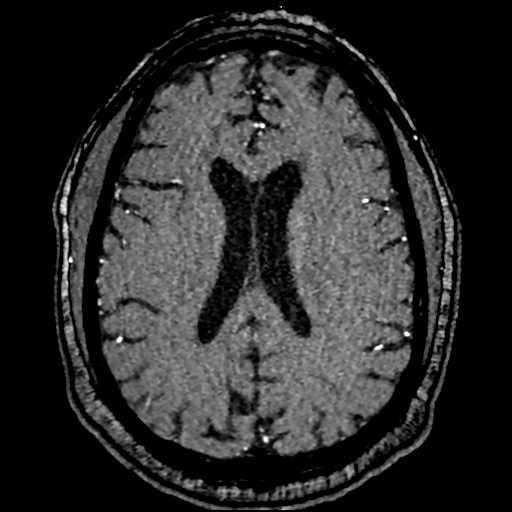
[im 214/224]
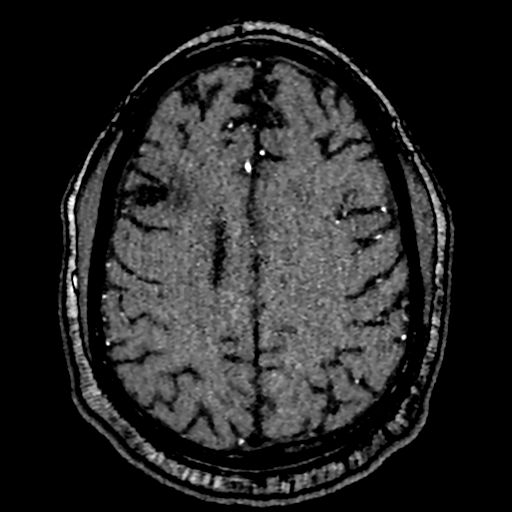

[18 of 48 positions shown; findings below may reference images not displayed]

FINDINGS: MRI HEAD FINDINGS

Brain: Small acute infarcts of the left paramedian frontal lobe and
ventral medial left thalamus. Multiple smaller foci of acute
ischemia within the left anterior cerebral artery distribution. No
acute hemorrhage. No chronic microhemorrhage. Old left cerebellar
and right frontal infarcts. There is multifocal hyperintense
T2-weighted signal within the white matter. Parenchymal volume and
CSF spaces are normal. The midline structures are normal.

Vascular: Major flow voids are preserved.

Skull and upper cervical spine: Normal calvarium and skull base.
Visualized upper cervical spine and soft tissues are normal.

Sinuses/Orbits:No paranasal sinus fluid levels or advanced mucosal
thickening. No mastoid or middle ear effusion. Normal orbits.

MRA HEAD FINDINGS

POSTERIOR CIRCULATION:

--Vertebral arteries: Normal

--Inferior cerebellar arteries: Normal.

--Basilar artery: Normal.

--Superior cerebellar arteries: Normal.

--Posterior cerebral arteries: Normal.

ANTERIOR CIRCULATION:

--Intracranial internal carotid arteries: Normal.

--Anterior cerebral arteries (ACA): There is occlusion of a short
segment of the left ACA A2 segment. There is moderate narrowing of
the right ACA proximal pericallosal portion.

--Middle cerebral arteries (MCA): Normal.

ANATOMIC VARIANTS: None

MRA NECK FINDINGS

Motion degraded time-of-flight imaging of the carotid and vertebral
arteries is unremarkable. There is no visible stenosis or occlusion.
IMPRESSION: 1. Small acute infarcts of the left paramedian frontal lobe and
ventromedial left thalamus. No hemorrhage or mass effect.
2. Short segment occlusion of the left ACA A2 segment.
3. Moderate narrowing of the right ACA proximal A3 segment.
4. Motion degraded time-of-flight MRA of the neck without visible
abnormality.

## 2020-10-06 IMAGING — CR DG CHEST 2V
2 series · 2 of 2 positions shown · non-contrast
Comparison: None.

CLINICAL DATA: Slurred speech and weakness

EXAM:
CHEST - 2 VIEW

[chest lat]
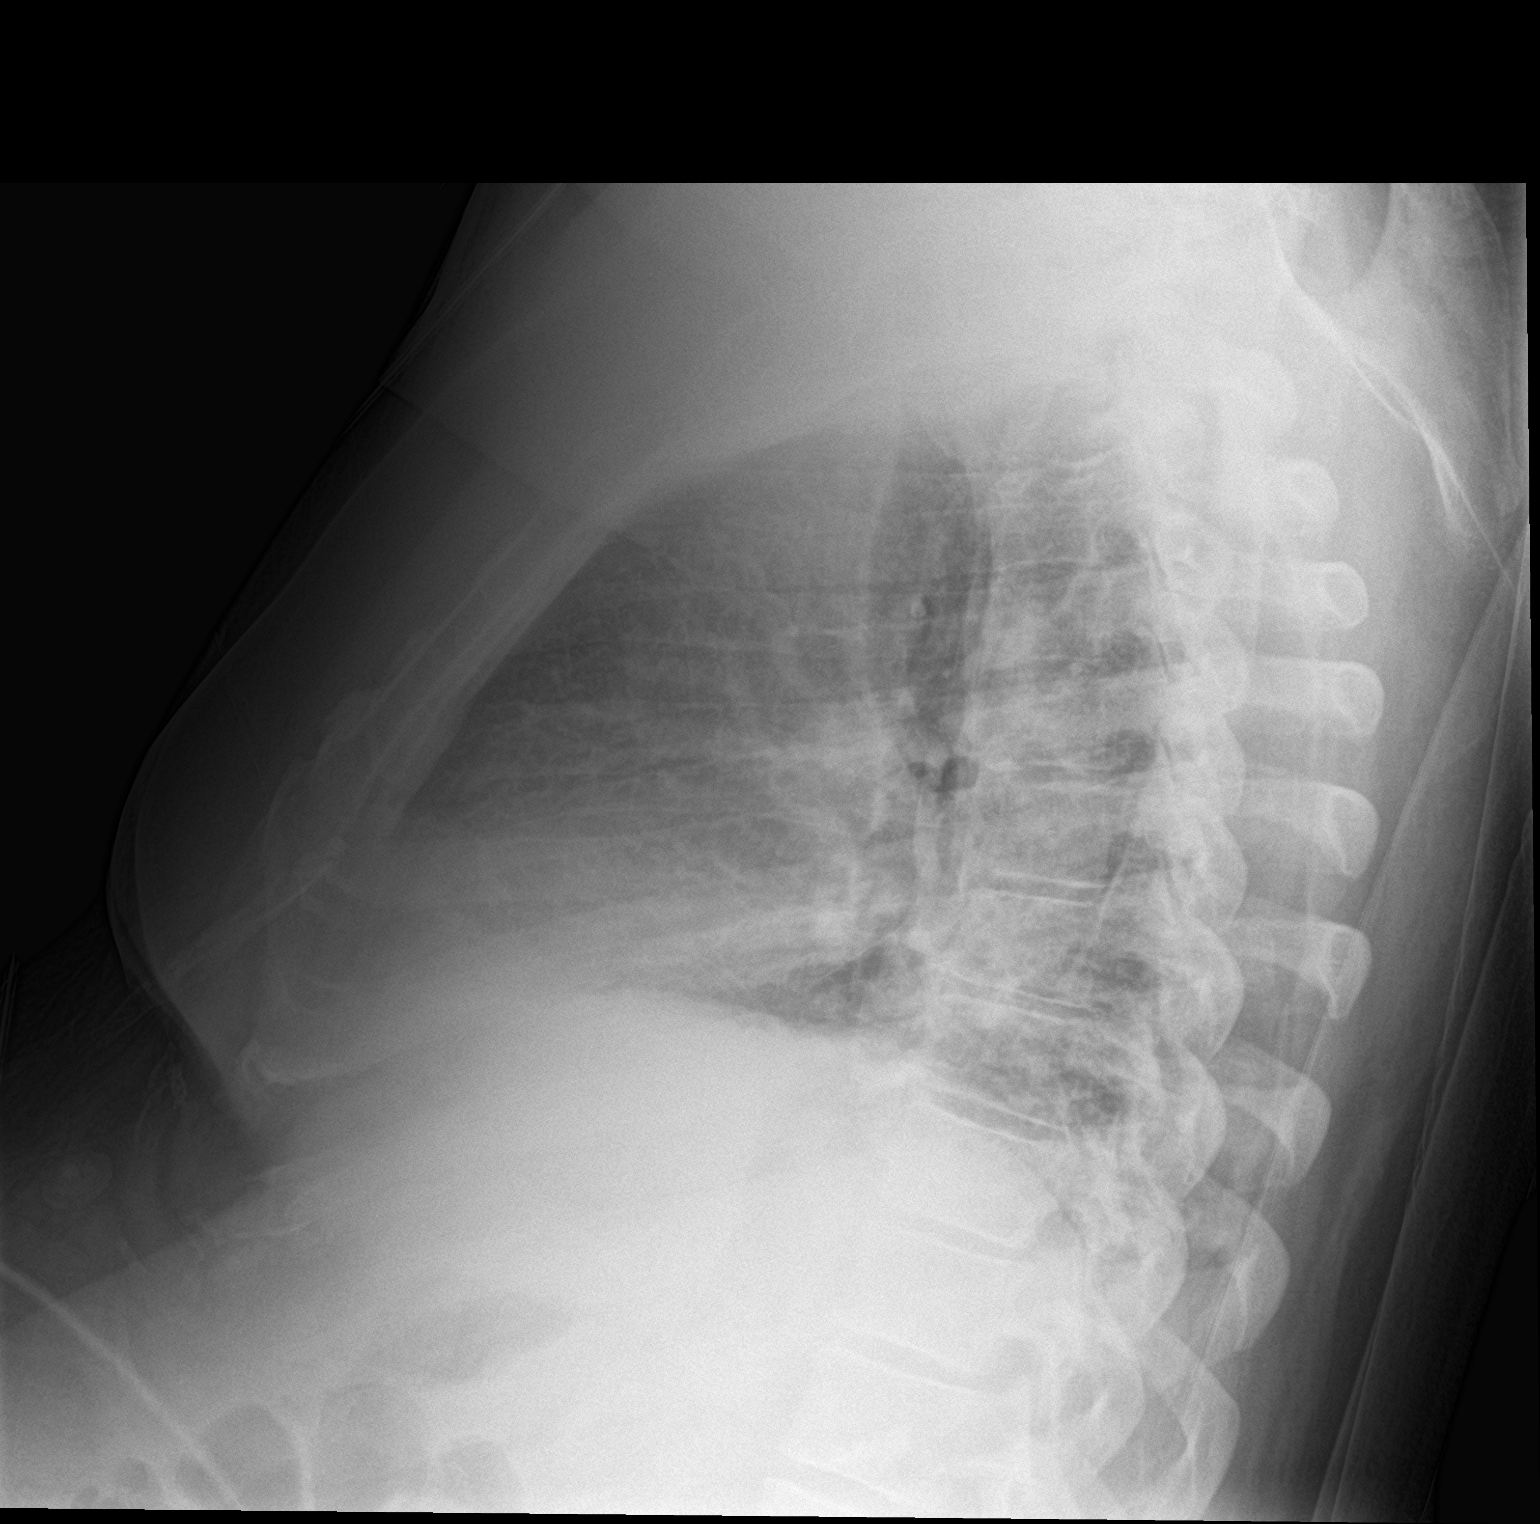

[chest ap]
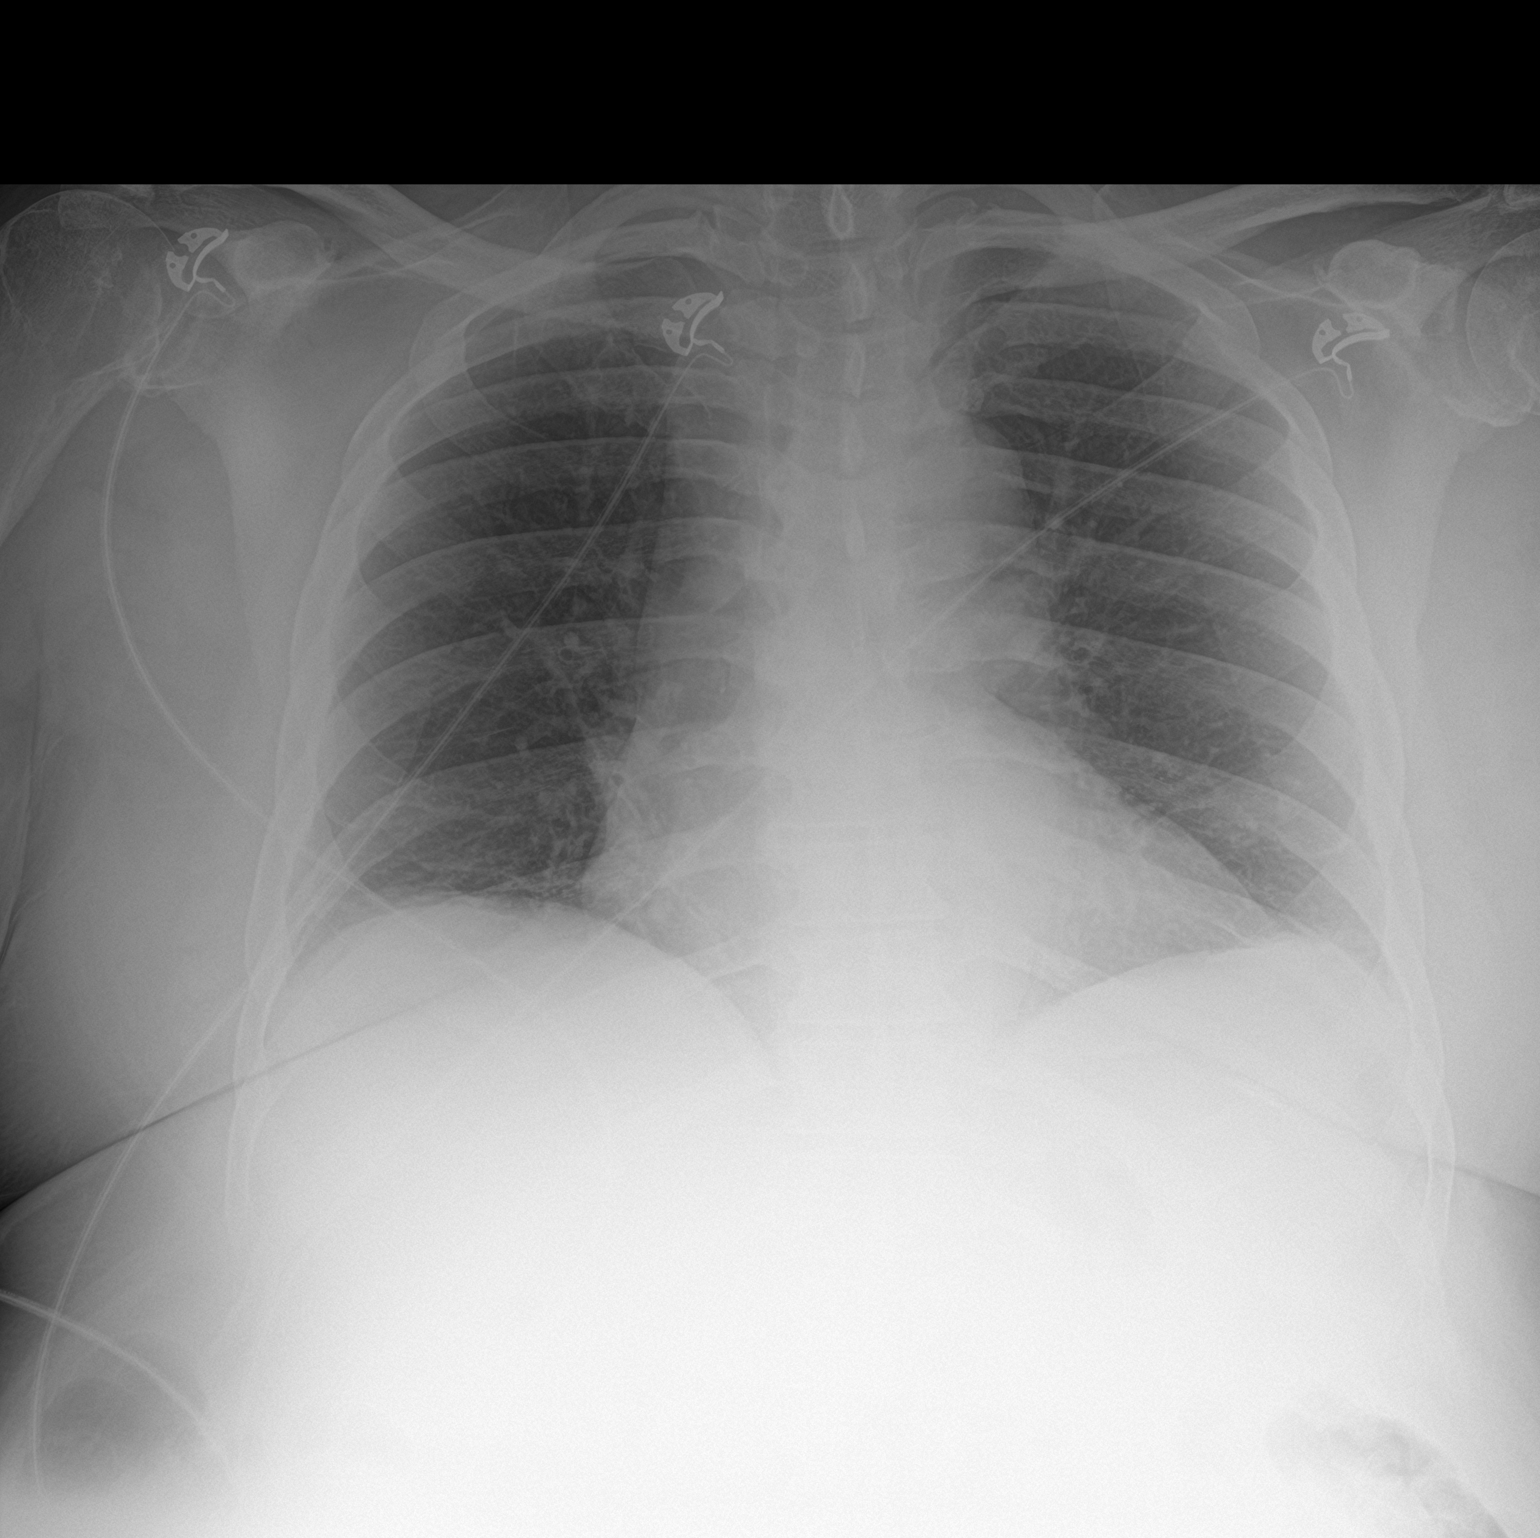

[2 of 2 positions shown; findings below may reference images not displayed]

FINDINGS: The heart size and mediastinal contours are within normal limits.
Both lungs are clear. The visualized skeletal structures are
unremarkable.
IMPRESSION: No active cardiopulmonary disease.

## 2020-10-06 IMAGING — MR MR MRA NECK W/O CM
1 of 2 series · 4 of 48 positions shown · non-contrast
Comparison: None.

CLINICAL DATA: Seizure, slurred speech and double vision.



[Series 1035: rcca bif rotate · sagittal · 0.5mm · 0.19mm/px · 4 of 11 slices shown]
[im 1/11]
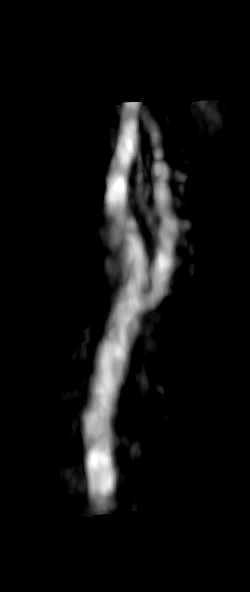
[im 4/11]
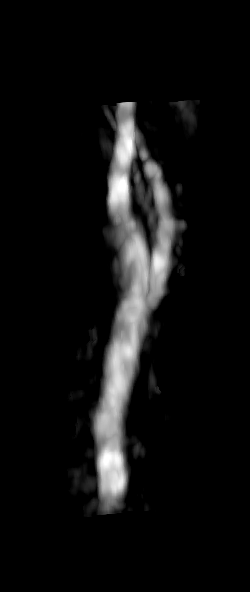
[im 7/11]
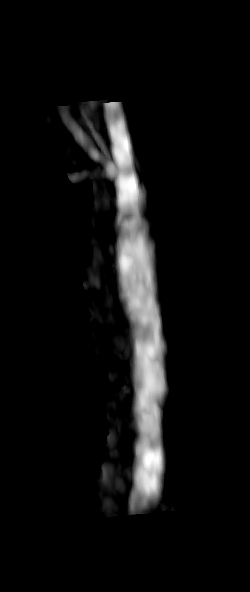
[im 11/11]
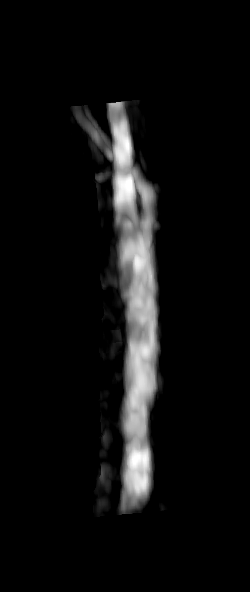

[4 of 48 positions shown; findings below may reference images not displayed]

FINDINGS: MRI HEAD FINDINGS

Brain: Small acute infarcts of the left paramedian frontal lobe and
ventral medial left thalamus. Multiple smaller foci of acute
ischemia within the left anterior cerebral artery distribution. No
acute hemorrhage. No chronic microhemorrhage. Old left cerebellar
and right frontal infarcts. There is multifocal hyperintense
T2-weighted signal within the white matter. Parenchymal volume and
CSF spaces are normal. The midline structures are normal.

Vascular: Major flow voids are preserved.

Skull and upper cervical spine: Normal calvarium and skull base.
Visualized upper cervical spine and soft tissues are normal.

Sinuses/Orbits:No paranasal sinus fluid levels or advanced mucosal
thickening. No mastoid or middle ear effusion. Normal orbits.

MRA HEAD FINDINGS

POSTERIOR CIRCULATION:

--Vertebral arteries: Normal

--Inferior cerebellar arteries: Normal.

--Basilar artery: Normal.

--Superior cerebellar arteries: Normal.

--Posterior cerebral arteries: Normal.

ANTERIOR CIRCULATION:

--Intracranial internal carotid arteries: Normal.

--Anterior cerebral arteries (ACA): There is occlusion of a short
segment of the left ACA A2 segment. There is moderate narrowing of
the right ACA proximal pericallosal portion.

--Middle cerebral arteries (MCA): Normal.

ANATOMIC VARIANTS: None

MRA NECK FINDINGS

Motion degraded time-of-flight imaging of the carotid and vertebral
arteries is unremarkable. There is no visible stenosis or occlusion.
IMPRESSION: 1. Small acute infarcts of the left paramedian frontal lobe and
ventromedial left thalamus. No hemorrhage or mass effect.
2. Short segment occlusion of the left ACA A2 segment.
3. Moderate narrowing of the right ACA proximal A3 segment.
4. Motion degraded time-of-flight MRA of the neck without visible
abnormality.

## 2020-10-06 IMAGING — MR MR HEAD W/O CM
12 series · 43 of 48 positions shown · non-contrast
Comparison: None.

CLINICAL DATA: Seizure, slurred speech and double vision.



[Series 5: ax dwi_tracew · axial · 3.0mm · 0.65mm/px · z∈[-98,+50]mm · 4 of 46 slices shown]
[im 1/46]
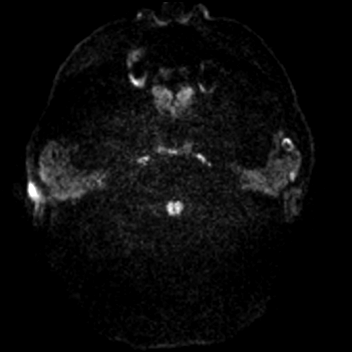
[im 16/46]
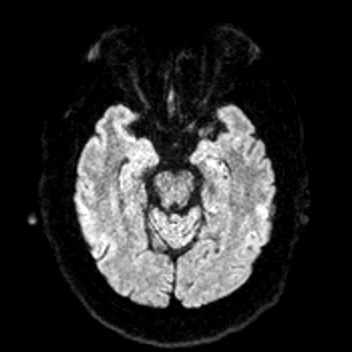
[im 31/46]
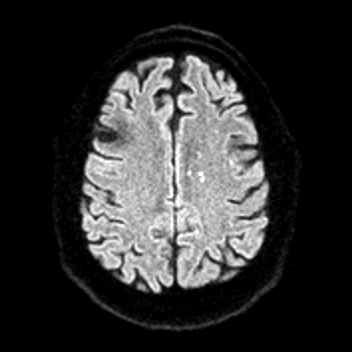
[im 46/46]
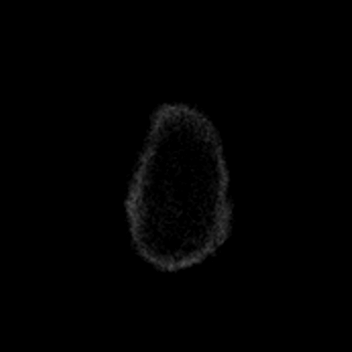

[Series 6: ax dwi_adc · axial · 3.0mm · 0.65mm/px · z∈[-98,+50]mm · 3 of 46 slices shown]
[im 1/46]
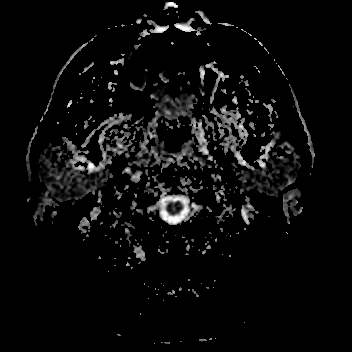
[im 23/46]
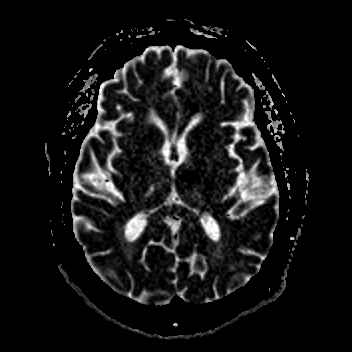
[im 46/46]
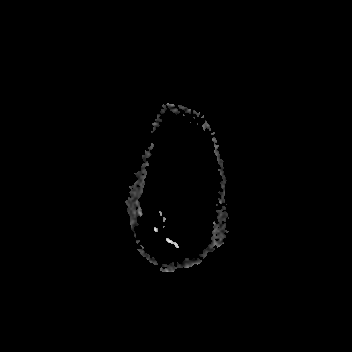

[Series 7: cor dwi_tracew · coronal · 5.0mm · 0.60mm/px · 3 of 38 slices shown]
[im 1/38]
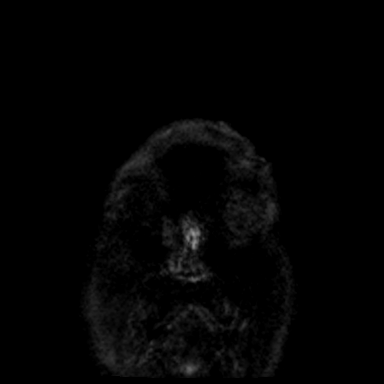
[im 19/38]
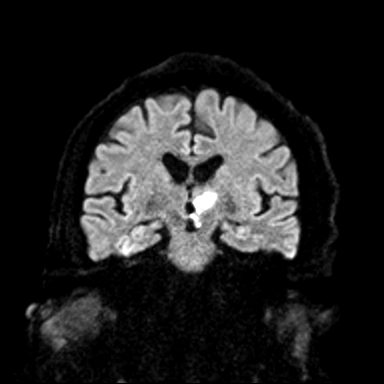
[im 38/38]
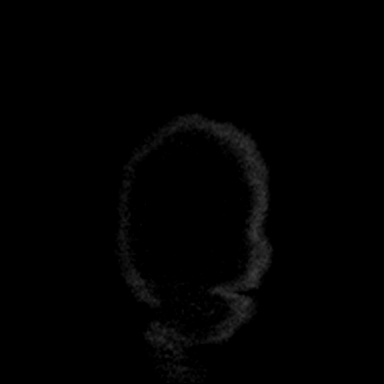

[Series 8: cor dwi_adc · coronal · 5.0mm · 0.60mm/px · 3 of 38 slices shown]
[im 1/38]
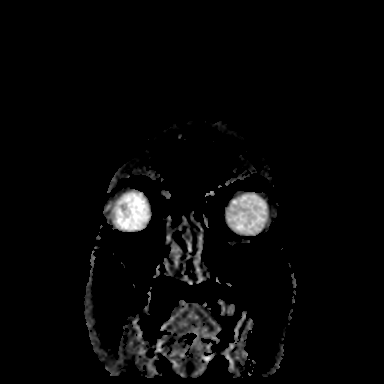
[im 19/38]
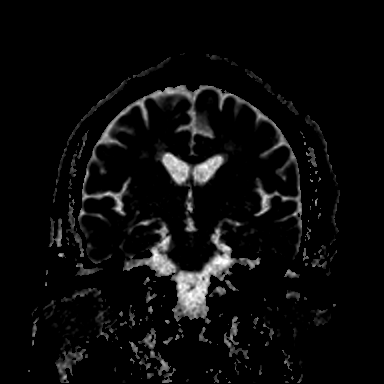
[im 38/38]
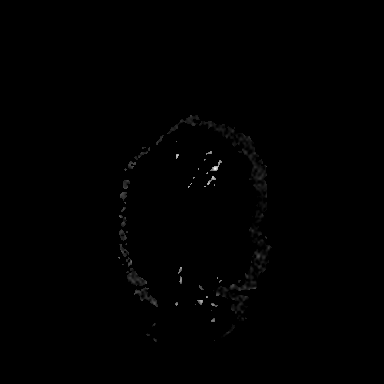

[Series 9: T1 · sagittal · 5.0mm · 0.62mm/px · 2 of 22 slices shown (1 of 2)]
[im 1/22]
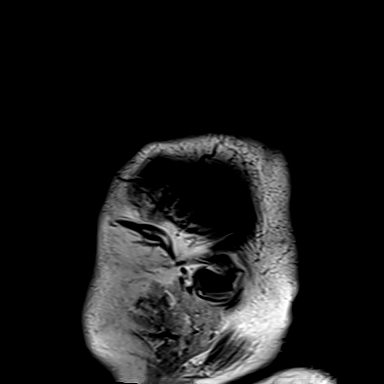
[im 22/22]
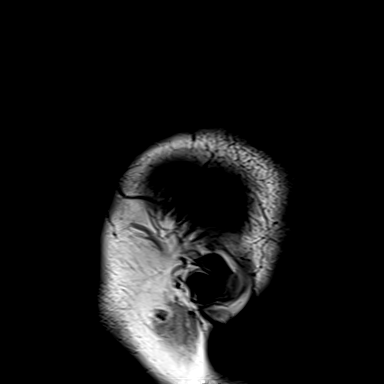

[Series 10: T2 · axial · 5.0mm · 0.53mm/px · z∈[-96,+47]mm · 2 of 25 slices shown (1 of 2)]
[im 1/25]
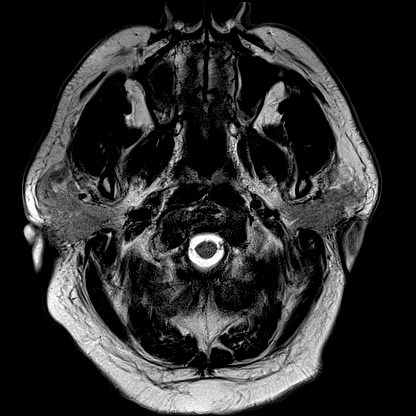
[im 25/25]
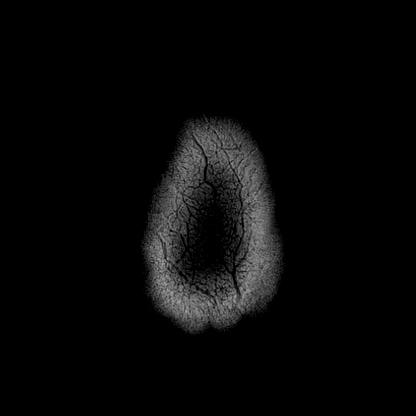

[Series 11: mag_images · axial · 3.0mm · 0.90mm/px · z∈[-112,+64]mm · 4 of 60 slices shown]
[im 1/60]
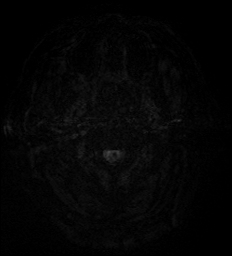
[im 20/60]
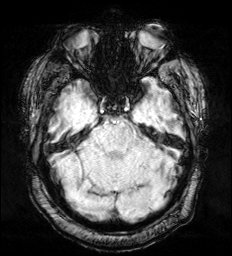
[im 40/60]
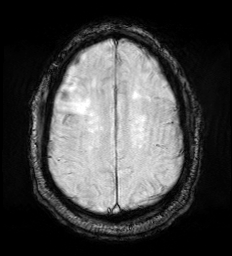
[im 60/60]
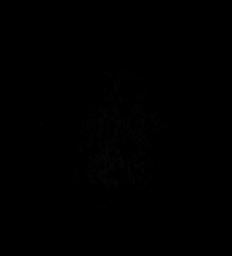

[Series 12: pha_images · axial · 3.0mm · 0.90mm/px · z∈[-112,+64]mm · 4 of 60 slices shown]
[im 1/60]
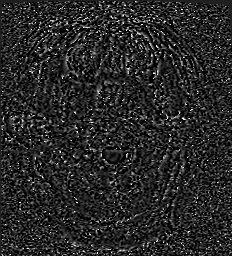
[im 20/60]
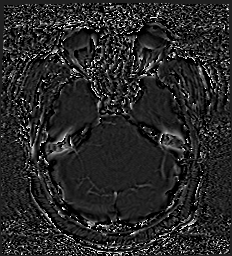
[im 40/60]
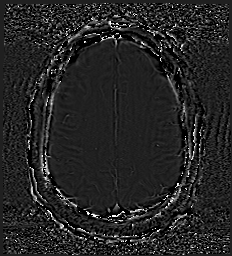
[im 60/60]
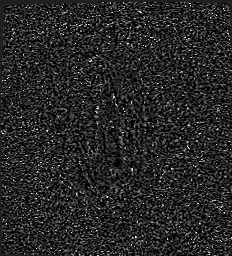

[Series 13: swi_images · axial · 3.0mm · 0.90mm/px · z∈[-112,+64]mm · 4 of 60 slices shown]
[im 1/60]
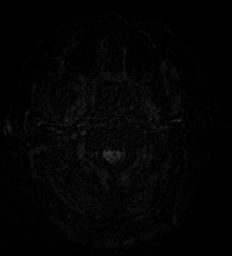
[im 20/60]
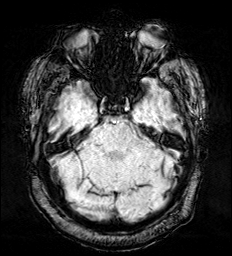
[im 40/60]
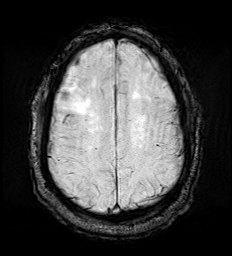
[im 60/60]
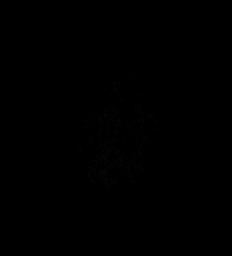

[Series 15: FLAIR · axial · 3.0mm · 0.53mm/px · z∈[-105,+56]mm · 4 of 55 slices shown]
[im 1/55]
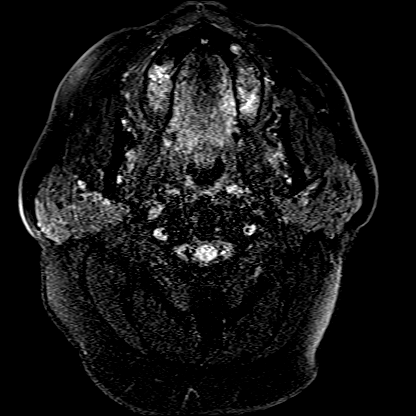
[im 19/55]
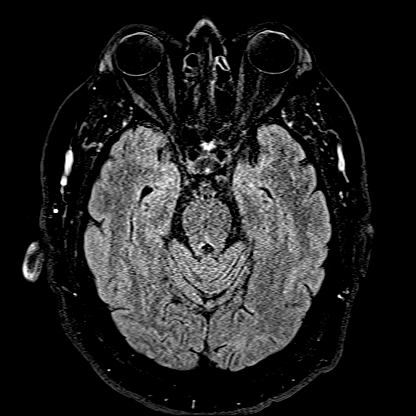
[im 37/55]
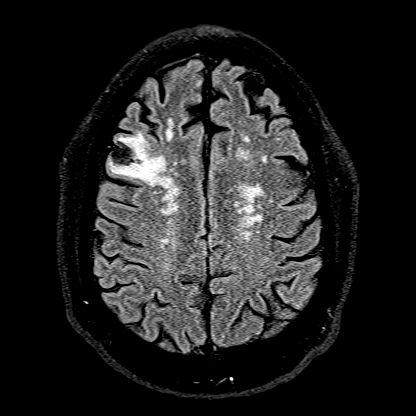
[im 55/55]
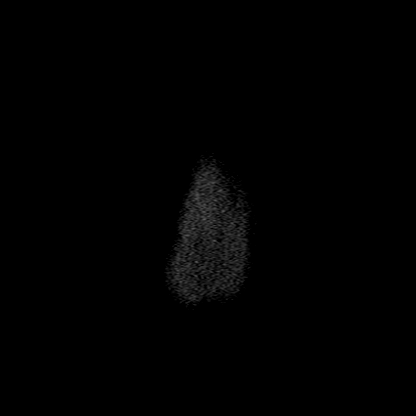

[Series 16: T1 · axial · 1.0mm · 0.98mm/px · z∈[-110,+63]mm · 8 of 176 slices shown (2 of 2)]
[im 1/176]
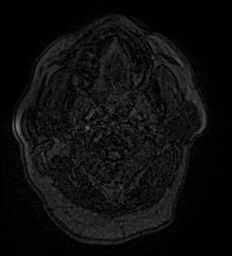
[im 30/176]
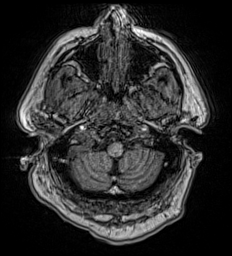
[im 59/176]
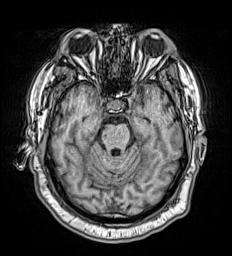
[im 73/176]
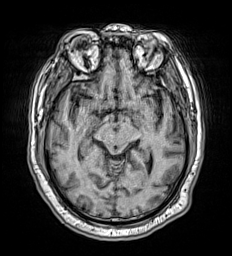
[im 103/176]
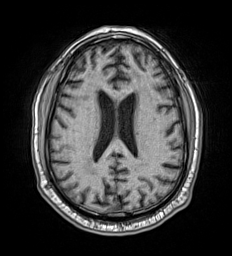
[im 117/176]
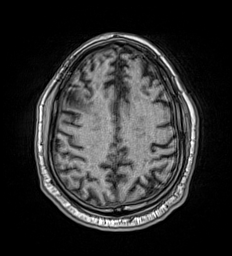
[im 146/176]
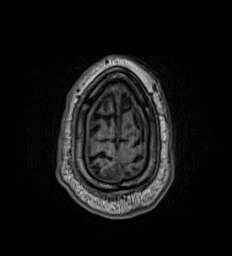
[im 176/176]
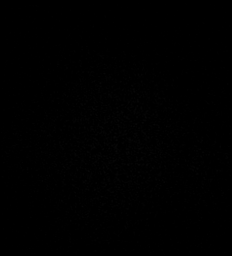

[Series 17: T2 · coronal · 5.0mm · 0.57mm/px · 2 of 29 slices shown (2 of 2)]
[im 1/29]
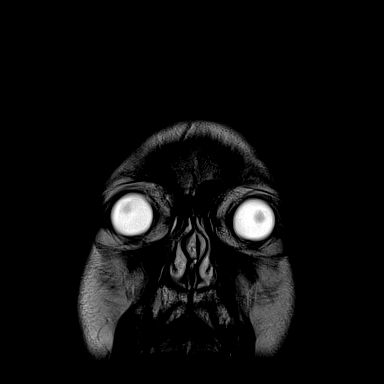
[im 29/29]
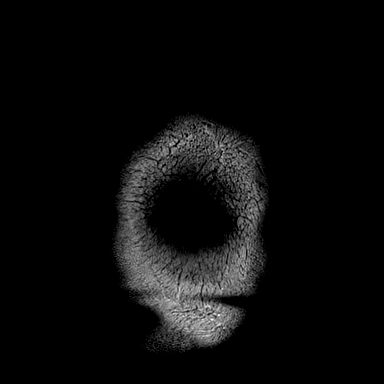

[43 of 48 positions shown; findings below may reference images not displayed]

FINDINGS: MRI HEAD FINDINGS

Brain: Small acute infarcts of the left paramedian frontal lobe and
ventral medial left thalamus. Multiple smaller foci of acute
ischemia within the left anterior cerebral artery distribution. No
acute hemorrhage. No chronic microhemorrhage. Old left cerebellar
and right frontal infarcts. There is multifocal hyperintense
T2-weighted signal within the white matter. Parenchymal volume and
CSF spaces are normal. The midline structures are normal.

Vascular: Major flow voids are preserved.

Skull and upper cervical spine: Normal calvarium and skull base.
Visualized upper cervical spine and soft tissues are normal.

Sinuses/Orbits:No paranasal sinus fluid levels or advanced mucosal
thickening. No mastoid or middle ear effusion. Normal orbits.

MRA HEAD FINDINGS

POSTERIOR CIRCULATION:

--Vertebral arteries: Normal

--Inferior cerebellar arteries: Normal.

--Basilar artery: Normal.

--Superior cerebellar arteries: Normal.

--Posterior cerebral arteries: Normal.

ANTERIOR CIRCULATION:

--Intracranial internal carotid arteries: Normal.

--Anterior cerebral arteries (ACA): There is occlusion of a short
segment of the left ACA A2 segment. There is moderate narrowing of
the right ACA proximal pericallosal portion.

--Middle cerebral arteries (MCA): Normal.

ANATOMIC VARIANTS: None

MRA NECK FINDINGS

Motion degraded time-of-flight imaging of the carotid and vertebral
arteries is unremarkable. There is no visible stenosis or occlusion.
IMPRESSION: 1. Small acute infarcts of the left paramedian frontal lobe and
ventromedial left thalamus. No hemorrhage or mass effect.
2. Short segment occlusion of the left ACA A2 segment.
3. Moderate narrowing of the right ACA proximal A3 segment.
4. Motion degraded time-of-flight MRA of the neck without visible
abnormality.

## 2020-10-06 MED ORDER — TRAZODONE HCL 50 MG PO TABS: 25.0000 mg | ORAL_TABLET | Freq: Every evening | ORAL | Status: DC | PRN

## 2020-10-06 MED ORDER — ACETAMINOPHEN 325 MG PO TABS: 650.0000 mg | ORAL_TABLET | Freq: Four times a day (QID) | ORAL | Status: DC | PRN

## 2020-10-06 MED ORDER — ONDANSETRON HCL 4 MG PO TABS: 4.0000 mg | ORAL_TABLET | Freq: Four times a day (QID) | ORAL | Status: DC | PRN

## 2020-10-06 MED ORDER — ATORVASTATIN CALCIUM 20 MG PO TABS
80.0000 mg | ORAL_TABLET | Freq: Every day | ORAL | Status: DC
  Administered 2020-10-07 – 2020-10-24 (×15): 80 mg via ORAL
  Filled 2020-10-06 (×16): qty 4

## 2020-10-06 MED ORDER — PIOGLITAZONE HCL 30 MG PO TABS
30.0000 mg | ORAL_TABLET | Freq: Every day | ORAL | Status: DC
  Administered 2020-10-06 – 2020-10-09 (×4): 30 mg via ORAL
  Filled 2020-10-06 (×5): qty 1

## 2020-10-06 MED ORDER — LOSARTAN POTASSIUM 50 MG PO TABS
100.0000 mg | ORAL_TABLET | Freq: Every day | ORAL | Status: DC
  Administered 2020-10-06 – 2020-10-09 (×4): 100 mg via ORAL
  Filled 2020-10-06 (×4): qty 2

## 2020-10-06 MED ORDER — ASPIRIN EC 81 MG PO TBEC
81.0000 mg | DELAYED_RELEASE_TABLET | Freq: Every day | ORAL | Status: DC
  Administered 2020-10-07 – 2020-10-09 (×3): 81 mg via ORAL
  Filled 2020-10-06 (×4): qty 1

## 2020-10-06 MED ORDER — GLIMEPIRIDE 4 MG PO TABS
4.0000 mg | ORAL_TABLET | Freq: Every day | ORAL | Status: DC
  Administered 2020-10-06 – 2020-10-09 (×4): 4 mg via ORAL
  Filled 2020-10-06 (×6): qty 1

## 2020-10-06 MED ORDER — INSULIN ASPART 100 UNIT/ML IJ SOLN
0.0000 [IU] | INTRAMUSCULAR | Status: DC
  Administered 2020-10-06: 3 [IU] via SUBCUTANEOUS
  Administered 2020-10-06: 2 [IU] via SUBCUTANEOUS
  Administered 2020-10-06: 3 [IU] via SUBCUTANEOUS
  Administered 2020-10-06: 2 [IU] via SUBCUTANEOUS
  Administered 2020-10-06: 5 [IU] via SUBCUTANEOUS
  Administered 2020-10-07 (×2): 3 [IU] via SUBCUTANEOUS
  Administered 2020-10-07 (×2): 5 [IU] via SUBCUTANEOUS
  Administered 2020-10-07: 3 [IU] via SUBCUTANEOUS
  Administered 2020-10-08: 5 [IU] via SUBCUTANEOUS
  Administered 2020-10-08 (×2): 2 [IU] via SUBCUTANEOUS
  Administered 2020-10-08 – 2020-10-09 (×3): 3 [IU] via SUBCUTANEOUS
  Administered 2020-10-09 (×2): 2 [IU] via SUBCUTANEOUS
  Administered 2020-10-09: 3 [IU] via SUBCUTANEOUS
  Administered 2020-10-10 – 2020-10-11 (×5): 2 [IU] via SUBCUTANEOUS
  Administered 2020-10-11: 3 [IU] via SUBCUTANEOUS
  Administered 2020-10-11: 2 [IU] via SUBCUTANEOUS
  Administered 2020-10-12: 3 [IU] via SUBCUTANEOUS
  Administered 2020-10-12: 2 [IU] via SUBCUTANEOUS
  Administered 2020-10-12: 3 [IU] via SUBCUTANEOUS
  Administered 2020-10-12 (×2): 2 [IU] via SUBCUTANEOUS
  Administered 2020-10-13 (×2): 3 [IU] via SUBCUTANEOUS
  Administered 2020-10-13: 2 [IU] via SUBCUTANEOUS
  Administered 2020-10-13 – 2020-10-14 (×6): 3 [IU] via SUBCUTANEOUS
  Administered 2020-10-14: 2 [IU] via SUBCUTANEOUS
  Administered 2020-10-14: 5 [IU] via SUBCUTANEOUS
  Administered 2020-10-15 (×2): 3 [IU] via SUBCUTANEOUS
  Administered 2020-10-15: 2 [IU] via SUBCUTANEOUS
  Administered 2020-10-15 – 2020-10-16 (×4): 3 [IU] via SUBCUTANEOUS
  Administered 2020-10-16: 18:00:00 5 [IU] via SUBCUTANEOUS
  Administered 2020-10-16 – 2020-10-18 (×5): 3 [IU] via SUBCUTANEOUS
  Administered 2020-10-18 (×2): 2 [IU] via SUBCUTANEOUS
  Administered 2020-10-18: 01:00:00 3 [IU] via SUBCUTANEOUS
  Administered 2020-10-18: 15:00:00 2 [IU] via SUBCUTANEOUS
  Administered 2020-10-18: 21:00:00 3 [IU] via SUBCUTANEOUS
  Administered 2020-10-19: 17:00:00 2 [IU] via SUBCUTANEOUS
  Administered 2020-10-19 (×2): 3 [IU] via SUBCUTANEOUS
  Administered 2020-10-19: 20:00:00 2 [IU] via SUBCUTANEOUS
  Administered 2020-10-19: 13:00:00 3 [IU] via SUBCUTANEOUS
  Administered 2020-10-20: 04:00:00 2 [IU] via SUBCUTANEOUS
  Administered 2020-10-20: 17:00:00 3 [IU] via SUBCUTANEOUS
  Administered 2020-10-20: 01:00:00 2 [IU] via SUBCUTANEOUS
  Administered 2020-10-20: 3 [IU] via SUBCUTANEOUS
  Administered 2020-10-20: 5 [IU] via SUBCUTANEOUS
  Administered 2020-10-21 (×2): 3 [IU] via SUBCUTANEOUS
  Administered 2020-10-21: 2 [IU] via SUBCUTANEOUS
  Administered 2020-10-21: 3 [IU] via SUBCUTANEOUS
  Administered 2020-10-21: 2 [IU] via SUBCUTANEOUS
  Administered 2020-10-21 – 2020-10-22 (×4): 3 [IU] via SUBCUTANEOUS
  Administered 2020-10-22 (×2): 2 [IU] via SUBCUTANEOUS
  Administered 2020-10-23 (×3): 3 [IU] via SUBCUTANEOUS
  Administered 2020-10-23: 12:00:00 2 [IU] via SUBCUTANEOUS
  Administered 2020-10-23: 04:00:00 3 [IU] via SUBCUTANEOUS
  Administered 2020-10-24 (×3): 2 [IU] via SUBCUTANEOUS
  Administered 2020-10-24: 01:00:00 3 [IU] via SUBCUTANEOUS
  Filled 2020-10-06 (×90): qty 1

## 2020-10-06 MED ORDER — ACETAMINOPHEN 650 MG RE SUPP: 650.0000 mg | Freq: Four times a day (QID) | RECTAL | Status: DC | PRN

## 2020-10-06 MED ORDER — SODIUM CHLORIDE 0.9 % IV SOLN: INTRAVENOUS | Status: DC

## 2020-10-06 MED ORDER — ASPIRIN 81 MG PO CHEW
324.0000 mg | CHEWABLE_TABLET | Freq: Once | ORAL | Status: AC
  Administered 2020-10-06: 324 mg via ORAL
  Filled 2020-10-06: qty 4

## 2020-10-06 MED ORDER — STROKE: EARLY STAGES OF RECOVERY BOOK: Freq: Once | Status: DC

## 2020-10-06 MED ORDER — MAGNESIUM HYDROXIDE 400 MG/5ML PO SUSP
30.0000 mL | Freq: Every day | ORAL | Status: DC | PRN
  Filled 2020-10-06: qty 30

## 2020-10-06 MED ORDER — ONDANSETRON HCL 4 MG/2ML IJ SOLN: 4.0000 mg | Freq: Four times a day (QID) | INTRAMUSCULAR | Status: DC | PRN

## 2020-10-06 MED ORDER — POTASSIUM CHLORIDE 20 MEQ PO PACK
40.0000 meq | PACK | Freq: Two times a day (BID) | ORAL | Status: AC
  Administered 2020-10-06 (×2): 40 meq via ORAL
  Filled 2020-10-06 (×2): qty 2

## 2020-10-06 MED ORDER — ATORVASTATIN CALCIUM 20 MG PO TABS
10.0000 mg | ORAL_TABLET | Freq: Every day | ORAL | Status: DC
  Administered 2020-10-06: 10 mg via ORAL
  Filled 2020-10-06: qty 1

## 2020-10-06 MED ORDER — ENOXAPARIN SODIUM 60 MG/0.6ML IJ SOSY
0.5000 mg/kg | PREFILLED_SYRINGE | INTRAMUSCULAR | Status: DC
  Administered 2020-10-06 – 2020-10-24 (×18): 60 mg via SUBCUTANEOUS
  Filled 2020-10-06 (×20): qty 0.6

## 2020-10-06 MED ORDER — CLOPIDOGREL BISULFATE 75 MG PO TABS
75.0000 mg | ORAL_TABLET | Freq: Every day | ORAL | Status: DC
  Administered 2020-10-06 – 2020-10-24 (×16): 75 mg via ORAL
  Filled 2020-10-06 (×17): qty 1

## 2020-10-06 NOTE — Consult Note (Signed)
[facility] Cardiology Consultation Note  Patient ID: Raymond Kerr, MRN: [id number], DOB/AGE: 1955-05-15 65 y.o. Admit date: 10/06/2020   Date of Consult: 10/06/2020 Primary Physician: Barbette Reichmann, MD Primary Cardiologist: None  Chief Complaint:  Chief Complaint  Patient presents with   Weakness   slurred speech   Aphasia   Reason for Consult:  Stroke  HPI: 65 y.o. male with hypertension and diabetes with peripheral vascular disease who has had no evidence of significant symptoms until yesterday when he was driving from [place] back to [place] and had some difficulty with vision as well as some speech.  On arrival to the emergency room here it appears that the patient has left-sided weakness and difficulty with speech.  MRI CT scan suggest left frontal lobe nonhemorrhagic stroke for which the patient has received some medication management at this time.  The patient has been a truck driver and has had no evidence of significant chest pain PND orthopnea syncope nausea or diaphoresis.  EKG although shows normal sinus rhythm with septal infarct age undetermined.  Troponin level was 55 and hemoglobin of 10.4.  Patient still has some difficulty with speech with above symptoms and subsequently has had an echocardiogram.  This echocardiogram shows poor transmission of sound but appears to have some septal hypofunction with apical hypoechoic function and ejection fraction of 40% potentially consistent with previous cardiovascular issues.  Currently the patient is hemodynamically stable.  Past Medical History:  Diagnosis Date   Diabetes mellitus without complication (HCC)    GERD (gastroesophageal reflux disease)    Hypertension       Surgical History: History reviewed. No pertinent surgical history.   Home Meds: Prior to Admission medications   Medication Sig Start Date End Date Taking? Authorizing Provider  glimepiride (AMARYL) 4 MG tablet Take 4 mg by mouth daily with  breakfast. 11/29/16 10/06/20 Yes [provider]  losartan (COZAAR) 100 MG tablet Take 100 mg by mouth daily. 11/29/16 10/06/20 Yes [provider]  pioglitazone (ACTOS) 30 MG tablet Take 30 mg by mouth daily. 08/29/20  Yes [provider]    Inpatient Medications:    stroke: mapping our early stages of recovery book   Does not apply Once   atorvastatin  10 mg Oral Daily   enoxaparin (LOVENOX) injection  0.5 mg/kg Subcutaneous Q24H   glimepiride  4 mg Oral Q breakfast   insulin aspart  0-15 Units Subcutaneous Q4H   losartan  100 mg Oral Daily   pioglitazone  30 mg Oral Daily    sodium chloride 100 mL/hr at 10/06/20 0324    Allergies: No Known Allergies  Social History   Socioeconomic History   Marital status: Divorced    Spouse name: Not on file   Number of children: Not on file   Years of education: Not on file   Highest education level: Not on file  Occupational History   Not on file  Tobacco Use   Smoking status: Never   Smokeless tobacco: Never  Substance and Sexual Activity   Alcohol use: Yes   Drug use: No   Sexual activity: Not on file  Other Topics Concern   Not on file  Social History Narrative   Not on file   Social Determinants of Health   Financial Resource Strain: Not on file  Food Insecurity: Not on file  Transportation Needs: Not on file  Physical Activity: Not on file  Stress: Not on file  Social Connections: Not on file  Intimate  Partner Violence: Not on file     Family History  Problem Relation Age of Onset   Prostate cancer Neg Hx    Bladder Cancer Neg Hx    Kidney cancer Neg Hx      Review of Systems Positive for vision issues and concerns for stroke Negative for: General:  chills, fever, night sweats or weight changes.  Cardiovascular: PND orthopnea syncope dizziness  Dermatological skin lesions rashes Respiratory: Cough congestion Urologic: Frequent urination urination at night and hematuria Abdominal:  negative for nausea, vomiting, diarrhea, bright red blood per rectum, melena, or hematemesis Neurologic: negative for  and/or hearing changes  All other systems reviewed and are otherwise negative except as noted above.  Labs: No results for input(s): CKTOTAL, CKMB, TROPONINI in the last 72 hours. Lab Results  Component Value Date   WBC 6.1 10/06/2020   HGB 10.4 (L) 10/06/2020   HCT 30.9 (L) 10/06/2020   MCV 90.6 10/06/2020   PLT 265 10/06/2020    Recent Labs  Lab 10/05/20 2002 10/06/20 0818  NA 135 136  K 3.7 3.2*  CL 100 101  CO2 27 26  BUN 14 13  CREATININE 1.07 1.09  CALCIUM 9.1 8.5*  PROT 7.4  --   BILITOT 0.6  --   ALKPHOS 70  --   ALT 14  --   AST 27  --   GLUCOSE 260* 167*   Lab Results  Component Value Date   CHOL 174 10/06/2020   HDL 30 (L) 10/06/2020   LDLCALC 124 (H) 10/06/2020   TRIG 102 10/06/2020   No results found for: DDIMER  Radiology/Studies:  DG Chest 2 View  Result Date: 10/06/2020 CLINICAL DATA:  Slurred speech and weakness EXAM: CHEST - 2 VIEW COMPARISON:  None. FINDINGS: The heart size and mediastinal contours are within normal limits. Both lungs are clear. The visualized skeletal structures are unremarkable. IMPRESSION: No active cardiopulmonary disease. Electronically Signed   By: Alcide Clever M.D.   On: 10/06/2020 01:57   CT HEAD WO CONTRAST  Result Date: 10/05/2020 CLINICAL DATA:  Neuro deficit, acute, stroke suspected. Slurred speech and double vision for the past 8 hours. EXAM: CT HEAD WITHOUT CONTRAST TECHNIQUE: Contiguous axial images were obtained from the base of the skull through the vertex without intravenous contrast. COMPARISON:  None. FINDINGS: Brain: Chronic small vessel ischemic changes within the bilateral periventricular white matter regions. Small chronic-appearing infarct within the LEFT cerebellum. Additional low-density areas within the LEFT thalamus and RIGHT frontal lobe, compatible with subacute to chronic infarcts,  favor chronic. No parenchymal mass or hemorrhage. No mass effect, midline shift or herniation seen. Vascular: Chronic calcified atherosclerotic changes of the large vessels at the skull base. No unexpected hyperdense vessel. Skull: Normal. Negative for fracture or focal lesion. Sinuses/Orbits: No acute finding. Other: None. IMPRESSION: 1. Low-density areas within the LEFT thalamus and RIGHT frontal lobe, compatible with subacute to chronic infarcts, favor chronic. 2. Additional chronic ischemic changes within the white matter regions and LEFT cerebellum. 3. No intracranial hemorrhage. No significant mass effect, midline shift or herniation. Electronically Signed   By: Bary Richard M.D.   On: 10/05/2020 20:21   MR ANGIO HEAD WO CONTRAST  Result Date: 10/06/2020 CLINICAL DATA:  Seizure, slurred speech and double vision. EXAM: MRI HEAD WITHOUT CONTRAST MRA HEAD WITHOUT CONTRAST MRA NECK WITHOUT CONTRAST TECHNIQUE: Multiplanar, multiecho pulse sequences of the brain and surrounding structures were obtained without intravenous contrast. Angiographic images of the Circle of Willis were obtained  using MRA technique without intravenous contrast. Angiographic images of the neck were obtained using MRA technique without intravenous contrast. Carotid stenosis measurements (when applicable) are obtained utilizing NASCET criteria, using the distal internal carotid diameter as the denominator. COMPARISON:  None. FINDINGS: MRI HEAD FINDINGS Brain: Small acute infarcts of the left paramedian frontal lobe and ventral medial left thalamus. Multiple smaller foci of acute ischemia within the left anterior cerebral artery distribution. No acute hemorrhage. No chronic microhemorrhage. 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 There is multifocal hyperintense T2-weighted signal within the white matter. Parenchymal volume and CSF spaces are normal. The midline structures are normal. Vascular: Major flow voids are preserved.  Skull and upper cervical spine: Normal calvarium and skull base. Visualized upper cervical spine and soft tissues are normal. Sinuses/Orbits:No paranasal sinus fluid levels or advanced mucosal thickening. No mastoid or middle ear effusion. Normal orbits. MRA HEAD FINDINGS POSTERIOR CIRCULATION: --Vertebral arteries: Normal --Inferior cerebellar arteries: Normal. --Basilar artery: Normal. --Superior cerebellar arteries: Normal. --Posterior cerebral arteries: Normal. ANTERIOR CIRCULATION: --Intracranial internal carotid arteries: Normal. --Anterior cerebral arteries (ACA): There is occlusion of a short segment of the left ACA A2 segment. There is moderate narrowing of the right ACA proximal pericallosal portion. --Middle cerebral arteries (MCA): Normal. ANATOMIC VARIANTS: None MRA NECK FINDINGS Motion degraded time-of-flight imaging of the carotid and vertebral arteries is unremarkable. There is no visible stenosis or occlusion. IMPRESSION: 1. Small acute infarcts of the left paramedian frontal lobe and ventromedial left thalamus. No hemorrhage or mass effect. 2. 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 3. Moderate narrowing of the right ACA proximal A3 segment. 4. Motion degraded time-of-flight MRA of the neck without visible abnormality. Electronically Signed   By: Deatra Robinson M.D.   On: 10/06/2020 03:37   MR ANGIO NECK WO CONTRAST  Result Date: 10/06/2020 CLINICAL DATA:  Seizure, slurred speech and double vision. EXAM: MRI HEAD WITHOUT CONTRAST MRA HEAD WITHOUT CONTRAST MRA NECK WITHOUT CONTRAST TECHNIQUE: Multiplanar, multiecho pulse sequences of the brain and surrounding structures were obtained without intravenous contrast. Angiographic images of the Circle of Willis were obtained using MRA technique without intravenous contrast. Angiographic images of the neck were obtained using MRA technique without intravenous contrast. Carotid stenosis measurements (when applicable) are obtained utilizing  NASCET criteria, using the distal internal carotid diameter as the denominator. COMPARISON:  None. FINDINGS: MRI HEAD FINDINGS Brain: Small acute infarcts of the left paramedian frontal lobe and ventral medial left thalamus. Multiple smaller foci of acute ischemia within the left anterior cerebral artery distribution. No acute hemorrhage. No chronic microhemorrhage. 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 There is multifocal hyperintense T2-weighted signal within the white matter. Parenchymal volume and CSF spaces are normal. The midline structures are normal. Vascular: Major flow voids are preserved. Skull and upper cervical spine: Normal calvarium and skull base. Visualized upper cervical spine and soft tissues are normal. Sinuses/Orbits:No paranasal sinus fluid levels or advanced mucosal thickening. No mastoid or middle ear effusion. Normal orbits. MRA HEAD FINDINGS POSTERIOR CIRCULATION: --Vertebral arteries: Normal --Inferior cerebellar arteries: Normal. --Basilar artery: Normal. --Superior cerebellar arteries: Normal. --Posterior cerebral arteries: Normal. ANTERIOR CIRCULATION: --Intracranial internal carotid arteries: Normal. --Anterior cerebral arteries (ACA): There is occlusion of a short segment of the left ACA A2 segment. There is moderate narrowing of the right ACA proximal pericallosal portion. --Middle cerebral arteries (MCA): Normal. ANATOMIC VARIANTS: None MRA NECK FINDINGS Motion degraded time-of-flight imaging of the carotid and vertebral arteries is unremarkable. There is no visible stenosis or  occlusion. IMPRESSION: 1. Small acute infarcts of the left paramedian frontal lobe and ventromedial left thalamus. No hemorrhage or mass effect. 2. 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 3. Moderate narrowing of the right ACA proximal A3 segment. 4. Motion degraded time-of-flight MRA of the neck without visible abnormality. Electronically Signed   By: Deatra RobinsonKevin  Herman M.D.   On: 10/06/2020  03:37   MR BRAIN WO CONTRAST  Result Date: 10/06/2020 CLINICAL DATA:  Seizure, slurred speech and double vision. EXAM: MRI HEAD WITHOUT CONTRAST MRA HEAD WITHOUT CONTRAST MRA NECK WITHOUT CONTRAST TECHNIQUE: Multiplanar, multiecho pulse sequences of the brain and surrounding structures were obtained without intravenous contrast. Angiographic images of the Circle of Willis were obtained using MRA technique without intravenous contrast. Angiographic images of the neck were obtained using MRA technique without intravenous contrast. Carotid stenosis measurements (when applicable) are obtained utilizing NASCET criteria, using the distal internal carotid diameter as the denominator. COMPARISON:  None. FINDINGS: MRI HEAD FINDINGS Brain: Small acute infarcts of the left paramedian frontal lobe and ventral medial left thalamus. Multiple smaller foci of acute ischemia within the left anterior cerebral artery distribution. No acute hemorrhage. No chronic microhemorrhage. 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 There is multifocal hyperintense T2-weighted signal within the white matter. Parenchymal volume and CSF spaces are normal. The midline structures are normal. Vascular: Major flow voids are preserved. Skull and upper cervical spine: Normal calvarium and skull base. Visualized upper cervical spine and soft tissues are normal. Sinuses/Orbits:No paranasal sinus fluid levels or advanced mucosal thickening. No mastoid or middle ear effusion. Normal orbits. MRA HEAD FINDINGS POSTERIOR CIRCULATION: --Vertebral arteries: Normal --Inferior cerebellar arteries: Normal. --Basilar artery: Normal. --Superior cerebellar arteries: Normal. --Posterior cerebral arteries: Normal. ANTERIOR CIRCULATION: --Intracranial internal carotid arteries: Normal. --Anterior cerebral arteries (ACA): There is occlusion of a short segment of the left ACA A2 segment. There is moderate narrowing of the right ACA proximal pericallosal portion.  --Middle cerebral arteries (MCA): Normal. ANATOMIC VARIANTS: None MRA NECK FINDINGS Motion degraded time-of-flight imaging of the carotid and vertebral arteries is unremarkable. There is no visible stenosis or occlusion. IMPRESSION: 1. Small acute infarcts of the left paramedian frontal lobe and ventromedial left thalamus. No hemorrhage or mass effect. 2. 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 3. Moderate narrowing of the right ACA proximal A3 segment. 4. Motion degraded time-of-flight MRA of the neck without visible abnormality. Electronically Signed   By: Deatra RobinsonKevin  Herman M.D.   On: 10/06/2020 03:37   ECHOCARDIOGRAM COMPLETE  Result Date: 10/06/2020    ECHOCARDIOGRAM REPORT   Patient Name:   Rupert StacksCURTIS Roza Date of Exam: 10/06/2020 Medical Rec #:  [id number]     Height:       72.0 in Accession #:    [id number]    Weight:       262.0 lb Date of Birth:  [birth year]    BSA:          2.390 m Patient Age:    64 years      BP:           119/78 mmHg Patient Gender: M             HR:           71 bpm. Exam Location:  [facility] Procedure: 2D Echo, Color Doppler and Cardiac Doppler Indications:     TIA G45.9  History:         Patient has no prior history of Echocardiogram examinations.  Risk Factors:Diabetes and Hypertension. GERD.  Sonographer:     Cristela Blue Referring Phys:  [id number] JAN A MANSY Diagnosing Phys: Arnoldo Hooker MD  Sonographer Comments: Technically challenging study due to limited acoustic windows. The only view obtainable was parasternal. IMPRESSIONS  1. Left ventricular ejection fraction, by estimation, is 40 to 45%. The left ventricle has mildly decreased function. The left ventricle demonstrates regional wall motion abnormalities (see scoring diagram/findings for description). Left ventricular diastolic parameters were normal.  2. Right ventricular systolic function is normal. The right ventricular size is normal.  3. The mitral valve is normal in structure. Mild mitral valve  regurgitation.  4. The aortic valve is normal in structure. Aortic valve regurgitation is not visualized. FINDINGS  Left Ventricle: Left ventricular ejection fraction, by estimation, is 40 to 45%. The left ventricle has mildly decreased function. The left ventricle demonstrates regional wall motion abnormalities. Moderate hypokinesis of the left ventricular, mid-apical anteroseptal wall. The left ventricular internal cavity size was normal in size. There is no left ventricular hypertrophy. Left ventricular diastolic parameters were normal. Right Ventricle: The right ventricular size is normal. No increase in right ventricular wall thickness. Right ventricular systolic function is normal. Left Atrium: Left atrial size was normal in size. Right Atrium: Right atrial size was normal in size. Pericardium: There is no evidence of pericardial effusion. Mitral Valve: The mitral valve is normal in structure. Mild mitral valve regurgitation. Tricuspid Valve: The tricuspid valve is normal in structure. Tricuspid valve regurgitation is mild. Aortic Valve: The aortic valve is normal in structure. Aortic valve regurgitation is not visualized. Pulmonic Valve: The pulmonic valve was normal in structure. Pulmonic valve regurgitation is not visualized. Aorta: The aortic root and ascending aorta are structurally normal, with no evidence of dilitation. IAS/Shunts: The interatrial septum was not assessed.  LEFT VENTRICLE PLAX 2D LVIDd:         4.30 cm LVIDs:         3.40 cm LV PW:         1.10 cm LV IVS:        1.20 cm LVOT diam:     2.00 cm LVOT Area:     3.14 cm  LEFT ATRIUM         Index LA diam:    2.90 cm 1.21 cm/m   AORTA Ao Root diam: 3.60 cm  SHUNTS Systemic Diam: 2.00 cm Arnoldo Hooker MD Electronically signed by Arnoldo Hooker MD Signature Date/Time: 10/06/2020/12:44:47 PM    Final     EKG: Normal sinus rhythm with septal infarct age undetermined  Weights: Filed Weights   10/05/20 1950  Weight: 118.8 kg      Physical Exam: Blood pressure (!) 150/80, pulse 83, temperature 98.2 F (36.8 C), temperature source Oral, resp. rate (!) 21, height 6' (1.829 m), weight 118.8 kg, SpO2 100 %. Body mass index is 35.53 kg/m. General: Well developed, well nourished, in no acute distress. Head eyes ears nose throat: Normocephalic, atraumatic, sclera non-icteric, no xanthomas, nares are without discharge. No apparent thyromegaly and/or mass  Lungs: Normal respiratory effort.  no wheezes, no rales, no rhonchi.  Heart: RRR with normal S1 S2. no murmur gallop, no rub, PMI is normal size and placement, carotid upstroke normal without bruit, jugular venous pressure is normal Abdomen: Soft, non-tender, non-distended with normoactive bowel sounds. No hepatomegaly. No rebound/guarding. No obvious abdominal masses. Abdominal aorta is normal size without bruit Extremities: No edema. no cyanosis, no clubbing, no ulcers  Peripheral : 2+ bilateral  upper extremity pulses, 2+ bilateral femoral pulses, 2+ bilateral dorsal pedal pulse Neuro: Alert and oriented.  Positive for facial asymmetry.  Positive for o focal deficit. Moves all extremities spontaneously. Musculoskeletal: Normal muscle tone without kyphosis Psych:  Responds to questions appropriately with a normal affect.    Assessment: 65 year old male with diabetes hypertension having acute stroke with abnormal EKG minimal elevation of troponin consistent with ischemic stroke without evidence of acute coronary syndrome but possible previous cardiovascular concerns needing further medical management and treatment options  Plan: 1.  Continue dual antiplatelet therapy for acute stroke likely ischemic in nature although could have possibility of embolic if patient had atrial fibrillation.  Currently there is no evidence of atrial fibrillation but will continue to monitor 2.  High intensity cholesterol therapy 3.  No further cardiac diagnostics at this time due to no  evidence of anginal symptoms 4.  Hypertension control with goal systolic blood pressure below 720 mm using beta-blocker ACE inhibitor as able 5.  Begin ambulation and rehabilitation and further treatment options after above  Signed, Lamar Blinks M.D. [facility] Cardiology 10/06/2020, 1:20 PM

## 2020-10-06 NOTE — ED Notes (Signed)
Pt fiance, sandra, is at the bedside reporting that the pt started having symptoms around midnight on Saturday night and that his symptoms have become worse since then. The pt responds "2016" to orientation question, oriented to person, unable to describe his symptoms,  unable to completely open the left eye, mild facial drooping, slurred speech.

## 2020-10-06 NOTE — ED Provider Notes (Signed)
[facility] Emergency Department Provider Note  ____________________________________________   Event Date/Time   First MD Initiated Contact with Patient 10/06/20 [phone]     (approximate)  I have reviewed the triage vital signs and the nursing notes.   HISTORY  Chief Complaint Weakness, slurred speech, and Aphasia   HPI Raymond Kerr is a 65 y.o. male past medical history of HTN, DM and GERD who presents for assessment of approximately 8 hours of double vision and weakness.  History is very limited from the patient secondary to altered mental status and appears to be some expressive aphasia.  However patient is not oriented to year and states it is 2015.  He is accompanied by his fiance who states that symptoms started at midnight on 8/13 initially consistent with some slurred speech and double vision but patient seemed to get more confused and have more weakness as the time went on.  He has never had a stroke before and she is not aware that he has been on a blood thinners.  No other history is immediately available on patient arrival.         Past Medical History:  Diagnosis Date   Diabetes mellitus without complication (HCC)    GERD (gastroesophageal reflux disease)    Hypertension     Patient Active Problem List   Diagnosis Date Noted   TIA (transient ischemic attack) 10/06/2020   Microhematuria 12/10/2016   Elevated PSA 12/10/2016   Diabetes mellitus (HCC) 11/28/2016   Benign essential hypertension 10/20/2015    History reviewed. No pertinent surgical history.  Prior to Admission medications   Medication Sig Start Date End Date Taking? Authorizing Provider  glimepiride (AMARYL) 4 MG tablet Take 4 mg by mouth daily with breakfast. 11/29/16 10/06/20 Yes [provider]  losartan (COZAAR) 100 MG tablet Take 100 mg by mouth daily. 11/29/16 10/06/20 Yes [provider]  pioglitazone (ACTOS) 30 MG tablet Take 30 mg by mouth daily.  08/29/20  Yes [provider]    Allergies Patient has no known allergies.  Family History  Problem Relation Age of Onset   Prostate cancer Neg Hx    Bladder Cancer Neg Hx    Kidney cancer Neg Hx     Social History Social History   Tobacco Use   Smoking status: Never   Smokeless tobacco: Never  Substance Use Topics   Alcohol use: Yes   Drug use: No    Review of Systems  Review of Systems  Unable to perform ROS: Mental status change     ____________________________________________   PHYSICAL EXAM:  VITAL SIGNS: ED Triage Vitals  Enc Vitals Group     BP 10/05/20 1945 (!) 151/84     Pulse Rate 10/05/20 1945 83     Resp 10/05/20 1945 20     Temp 10/05/20 1945 97.9 F (36.6 C)     Temp Source 10/05/20 1945 Oral     SpO2 10/05/20 1945 99 %     Weight 10/05/20 1950 262 lb (118.8 kg)     Height 10/05/20 1950 6' (1.829 m)     Head Circumference --      Peak Flow --      Pain Score 10/05/20 1947 0     Pain Loc --      Pain Edu? --      Excl. in GC? --    Vitals:   10/05/20 1945  BP: (!) 151/84  Pulse: 83  Resp: 20  Temp:  97.9 F (36.6 C)  SpO2: 99%   Physical Exam Vitals and nursing note reviewed.  Constitutional:      General: He is in acute distress.     Appearance: He is well-developed. He is ill-appearing.  HENT:     Head: Normocephalic and atraumatic.     Right Ear: External ear normal.     Left Ear: External ear normal.     Nose: Nose normal.  Eyes:     Conjunctiva/sclera: Conjunctivae normal.  Cardiovascular:     Rate and Rhythm: Normal rate and regular rhythm.     Heart sounds: No murmur heard. Pulmonary:     Effort: Pulmonary effort is normal. No respiratory distress.     Breath sounds: Normal breath sounds.  Abdominal:     Palpations: Abdomen is soft.     Tenderness: There is no abdominal tenderness.  Musculoskeletal:     Cervical back: Neck supple.  Skin:    General: Skin is warm and dry.     Capillary Refill: Capillary  refill takes less than 2 seconds.  Neurological:     Mental Status: He is alert. He is disoriented.     Cranial Nerves: Cranial nerve deficit present.     Motor: Weakness present.    Patient has no pronator drift.  He has symmetric grip strength in his bile upper extremities.  He is unable to lift his heels off the bed on command.  He withdraws lower extremities to noxious stimuli.  2+ radial pulses.  He has a left-sided facial droop and very slightly slurred speech.  He seems to have an element of expressive aphasia but does seem to understand commands.  Pupils are symmetric and reactive to light bilaterally. ____________________________________________   LABS (all labs ordered are listed, but only abnormal results are displayed)  Labs Reviewed  COMPREHENSIVE METABOLIC PANEL - Abnormal; Notable for the following components:      Result Value   Glucose, Bld 260 (*)    Albumin 3.3 (*)    All other components within normal limits  LIPID PANEL - Abnormal; Notable for the following components:   HDL 35 (*)    LDL Cholesterol 136 (*)    All other components within normal limits  CBG MONITORING, ED - Abnormal; Notable for the following components:   Glucose-Capillary 303 (*)    All other components within normal limits  TROPONIN I (HIGH SENSITIVITY) - Abnormal; Notable for the following components:   Troponin I (High Sensitivity) 62 (*)    All other components within normal limits  RESP PANEL BY RT-PCR (FLU A&B, COVID) ARPGX2  PROTIME-INR  APTT  CBC  DIFFERENTIAL  ETHANOL  URINE DRUG SCREEN, QUALITATIVE ([facility] ONLY)  URINALYSIS, ROUTINE W REFLEX MICROSCOPIC  HEMOGLOBIN A1C  TROPONIN I (HIGH SENSITIVITY)   ____________________________________________  EKG  Sinus rhythm with a ventricular of 84, normal axis, unremarkable intervals without clear evidence of acute ischemia or significant arrhythmia.  Very nonspecific change on anterior  leads. ____________________________________________  RADIOLOGY  ED MD interpretation: CT noncontrast head shows areas of low-density in the left thalamus and right frontal lobe concerning for subacute to chronic infarct.  There is also some chronic ischemic changes in the white matter and left cerebellum.  No evidence of hemorrhage edema or herniation.  Chest x-ray is unremarkable for effusion, edema, pneumothorax any other clear acute intrathoracic process  Official radiology report(s): DG Chest 2 View  Result Date: 10/06/2020 CLINICAL DATA:  Slurred speech and weakness EXAM: CHEST -  2 VIEW COMPARISON:  None. FINDINGS: The heart size and mediastinal contours are within normal limits. Both lungs are clear. The visualized skeletal structures are unremarkable. IMPRESSION: No active cardiopulmonary disease. Electronically Signed   By: Alcide Clever M.D.   On: 10/06/2020 01:57   CT HEAD WO CONTRAST  Result Date: 10/05/2020 CLINICAL DATA:  Neuro deficit, acute, stroke suspected. Slurred speech and double vision for the past 8 hours. EXAM: CT HEAD WITHOUT CONTRAST TECHNIQUE: Contiguous axial images were obtained from the base of the skull through the vertex without intravenous contrast. COMPARISON:  None. FINDINGS: Brain: Chronic small vessel ischemic changes within the bilateral periventricular white matter regions. Small chronic-appearing infarct within the LEFT cerebellum. Additional low-density areas within the LEFT thalamus and RIGHT frontal lobe, compatible with subacute to chronic infarcts, favor chronic. No parenchymal mass or hemorrhage. No mass effect, midline shift or herniation seen. Vascular: Chronic calcified atherosclerotic changes of the large vessels at the skull base. No unexpected hyperdense vessel. Skull: Normal. Negative for fracture or focal lesion. Sinuses/Orbits: No acute finding. Other: None. IMPRESSION: 1. Low-density areas within the LEFT thalamus and RIGHT frontal lobe, compatible  with subacute to chronic infarcts, favor chronic. 2. Additional chronic ischemic changes within the white matter regions and LEFT cerebellum. 3. No intracranial hemorrhage. No significant mass effect, midline shift or herniation. Electronically Signed   By: Bary Richard M.D.   On: 10/05/2020 20:21    ____________________________________________   PROCEDURES  Procedure(s) performed (including Critical Care):  .1-3 Lead EKG Interpretation  Date/Time: 10/06/2020 1:47 AM Performed by: Gilles Chiquito, MD Authorized by: Gilles Chiquito, MD     Interpretation: normal     ECG rate assessment: normal     Rhythm: sinus rhythm     Ectopy: none     Conduction: normal     ____________________________________________   INITIAL IMPRESSION / ASSESSMENT AND PLAN / ED COURSE      Patient presents with above-stated history and exam for assessment of some reported double vision and weakness starting at midnight on 8/13.  History is very limited from the patient is on arrival secondary to altered mental status and apparent symptoms of aphasia.  He does have a left-sided facial droop and is able to lift his legs off the bed.  He also is not oriented.  No obvious trauma on exam.  Per fianc at bedside he has never had a history of stroke and otherwise has not had any sick symptoms as far she can recall.  The abdomen on the way back to the area after recent trip out of town.  Differential includes CVA, metabolic derangements, intoxication, with lower suspicion for recent trauma or acute infectious process.  ECG without significant arrhythmia or clear acute ischemia.  There are some nonspecific changes in anterior leads and initial troponin is 62.  Will give ASA but defer heparin pending repeat troponin as patient shaking his head no when asked several times if he is having any chest pain.  Chest x-ray is unremarkable evidence of pneumonia heart failure or other clear acute thoracic process.  CT  noncontrast head shows areas of low-density in the left thalamus and right frontal lobe concerning for subacute to chronic infarct.  There is also some chronic ischemic changes in the white matter and left cerebellum.  No evidence of hemorrhage edema or herniation.  CMP shows no significant electrode or metabolic derangements.  Glucose is slightly elevated 260.  No evidence of acidosis or DKA.  Patient placed  on slight scale insulin.  CBC is unremarkable.  I will plan to admit to medicine service for further evaluation management of concern for subacute CVA and some demand already at ischemia.      ____________________________________________   FINAL CLINICAL IMPRESSION(S) / ED DIAGNOSES  Final diagnoses:  Cerebrovascular accident (CVA), unspecified mechanism (HCC)  NSTEMI (non-ST elevated myocardial infarction) (HCC)  Hyperglycemia    Medications  aspirin chewable tablet 324 mg (has no administration in time range)  insulin aspart (novoLOG) injection 0-15 Units (has no administration in time range)     ED Discharge Orders     None        Note:  This document was prepared using Dragon voice recognition software and may include unintentional dictation errors.    Gilles ChiquitoSmith, Marvelous Bouwens P, MD 10/06/20 [phone]

## 2020-10-06 NOTE — Evaluation (Signed)
Speech Language Pathology Evaluation Patient Details Name: Raymond Kerr MRN: [id number] DOB: August 10, 1955 Today's Date: 10/06/2020 Time: 1300-1400 SLP Time Calculation (min) (ACUTE ONLY): 60 min  Problem List:  Patient Active Problem List   Diagnosis Date Noted   TIA (transient ischemic attack) 10/06/2020   Acute ischemic left ACA stroke (HCC) 10/06/2020   Microhematuria 12/10/2016   Elevated PSA 12/10/2016   Diabetes mellitus (HCC) 11/28/2016   Benign essential hypertension 10/20/2015   Past Medical History:  Past Medical History:  Diagnosis Date   Diabetes mellitus without complication (HCC)    GERD (gastroesophageal reflux disease)    Hypertension    Past Surgical History: History reviewed. No pertinent surgical history. HPI:  Per sdmitting H&P "Raymond Kerr is a 65 y.o. African-American male with medical history significant for type II diabetes mellitus, GERD and hypertension, who presented to the emergency room with acute onset of diplopia and generalized weakness that started 8 hours prior to presentation.  The patient has been having expressive dysphasia.  His fiance who accompanied him stated that his symptoms started around midnight on 8/13 with dysarthria and diplopia but he was more confused.  He was noted in the ER to have left facial droop and was unable to lift his left heel.  They went to [place] on Friday and he started having symptoms on Saturday night with double vision.  They went to bed at 12 midnight and at 6 AM she noted that he knocked food on the floor and was later starting to have slurred speech.  He was feeling generally weak and unable to ambulate however without unilateral focal muscle weakness or paresthesias.  No chest pain or dyspnea or palpitations.  No cough or wheezing.  No urinary or stool incontinence.  No vertigo or tinnitus.  No witnessed seizures.  "                                                                                                                                    MRI of the brain showed small acute infarcts of the left paramedian frontal lobe and  ventromedial left thalamus   Assessment / Plan / Recommendation Clinical Impression  Pt presents with cognitive linguistic deficits warrenting further assessment and treatment. Pt's girlfriend was in the room during todays assessment and reports Pt was not able to produce sentences earlier today but has shown significant improvement over the past few hours. Oral mech exam revealed structures to be functioming adequately. Lunch arrived with ST in the room therefore his tray was set up so Pt could eat during this exam.No dysphagia or s/s of aspiration noted. Modified speech and language evaluation revealed Pt having difficulty with multistep directions, complex Y/n questions, and orientation and memory tasks. Pt's girlfriend reports he is not yet back to baseline and was independent prior to this admission. Speech was mostly intelligible with occasional cues to speak louder. 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 He stays with his daughter when he is in town. Rec ST eval and treat at discharge. Pt may benefit from inpatient rehab. Prognosis good.    SLP Assessment  SLP Recommendation/Assessment: All further Speech Lanaguage Pathology  needs can be addressed in the next venue of care SLP Visit Diagnosis: Cognitive communication deficit (R41.841)    Follow Up Recommendations  Inpatient Rehab  To be considered  Frequency and Duration   Eval and treat at discharge        SLP Evaluation Cognition  Overall Cognitive Status: Impaired/Different from baseline Arousal/Alertness: Awake/alert Orientation Level: Oriented to person;Oriented to situation;Disoriented to person Attention: Selective Selective Attention: Appears intact Memory: Impaired Memory Impairment: Decreased short term memory;Decreased long term memory       Comprehension  Auditory  Comprehension Overall Auditory Comprehension: Impaired Yes/No Questions: Impaired Complex Questions: 50-74% accurate Commands: Impaired Multistep Basic Commands: 50-74% accurate Conversation: Simple EffectiveTechniques: Extra processing time;Repetition Reading Comprehension Reading Status: Not tested    Expression Expression Primary Mode of Expression: Verbal Verbal Expression Overall Verbal Expression: Appears within functional limits for tasks assessed Automatic Speech: Name;Social Response Level of Generative/Spontaneous Verbalization: Conversation Repetition: Impaired Level of Impairment: Sentence level Naming: No impairment Pragmatics: No impairment Written Expression Dominant Hand: Right Written Expression: Not tested   Oral / Sales executive Speech Overall Motor Speech: Appears within functional limits for tasks assessed Respiration: Within functional limits Phonation: Normal Resonance: Within functional limits Articulation: Within functional limitis Intelligibility: Intelligible Motor Planning: Witnin functional limits   GO                    Eather Colas 10/06/2020, 2:38 PM

## 2020-10-06 NOTE — H&P (Signed)
PATIENT NAME: Raymond Kerr    MR#:  [id number]  DATE OF BIRTH:  1955-08-17  DATE OF ADMISSION:  10/06/2020  PRIMARY CARE PHYSICIAN: Barbette Reichmann, MD   Patient is coming from: Home  REQUESTING/REFERRING PHYSICIAN: Antoine Primas, MD  CHIEF COMPLAINT:   Chief Complaint  Patient presents with   Weakness   slurred speech   Aphasia    HISTORY OF PRESENT ILLNESS:  Raymond Kerr is a 65 y.o. African-American male with medical history significant for type II diabetes mellitus, GERD and hypertension, who presented to the emergency room with acute onset of diplopia and generalized weakness that started 8 hours prior to presentation.  The patient has been having expressive dysphasia.  His fiance who accompanied him stated that his symptoms started around midnight on 8/13 with dysarthria and diplopia but he was more confused.  He was noted in the ER to have left facial droop and was unable to lift his left heel.  They went to [place] on Friday and he started having symptoms on Saturday night with double vision.  They went to bed at 12 midnight and at 6 AM she noted that he knocked food on the floor and was later starting to have slurred speech.  He was feeling generally weak and unable to ambulate however without unilateral focal muscle weakness or paresthesias.  No chest pain or dyspnea or palpitations.  No cough or wheezing.  No urinary or stool incontinence.  No vertigo or tinnitus.  No witnessed seizures.  ED Course: Blood pressure was 151/84 with otherwise normal vital signs.  Labs revealed a blood glucose of 260 with otherwise unremarkable CMP.  High-sensitivity troponin was 62 and later 68.  Lipid panel showed total cholesterol 199 and LDL of 136.  CBC was within normal.  Alcohol level was less than 10. EKG as reviewed by me : Showed normal sinus rhythm with rate of 84 with Q waves anteroseptally. Imaging: Noncontrast head CT scan revealed: 1. Low-density  areas within the LEFT thalamus and RIGHT frontal lobe, compatible with subacute to chronic infarcts, favor chronic. 2. Additional chronic ischemic changes within the white matter regions and LEFT cerebellum. 3. No intracranial hemorrhage. No significant mass effect, midline shift or herniation.  2 view chest ray showed no acute cardiopulmonary disease.  MRI with MRA is currently pending.  The patient was given 4 baby aspirin.  He will be admitted to an observation medical monitored bed for further evaluation and management.  PAST MEDICAL HISTORY:   Past Medical History:  Diagnosis Date   Diabetes mellitus without complication (HCC)    GERD (gastroesophageal reflux disease)    Hypertension     PAST SURGICAL HISTORY:  History reviewed. No pertinent surgical history.  He denies any previous surgeries.  SOCIAL HISTORY:   Social History   Tobacco Use   Smoking status: Never   Smokeless tobacco: Never  Substance Use Topics   Alcohol use: Yes    FAMILY HISTORY:   Family History  Problem Relation Age of Onset   Prostate cancer Neg Hx    Bladder Cancer Neg Hx    Kidney cancer Neg Hx     DRUG ALLERGIES:  No Known Allergies  REVIEW OF SYSTEMS:   ROS As per history of present illness. All pertinent systems were reviewed above. Constitutional, HEENT, cardiovascular, respiratory, GI, GU, musculoskeletal, neuro, psychiatric, endocrine, integumentary and hematologic systems were reviewed and are otherwise negative/unremarkable except for positive findings mentioned above  in the HPI.   MEDICATIONS AT HOME:   Prior to Admission medications   Medication Sig Start Date End Date Taking? Authorizing Provider  glimepiride (AMARYL) 4 MG tablet Take 4 mg by mouth daily with breakfast. 11/29/16 10/06/20 Yes [provider]  losartan (COZAAR) 100 MG tablet Take 100 mg by mouth daily. 11/29/16 10/06/20 Yes [provider]  pioglitazone (ACTOS) 30 MG tablet Take 30 mg by  mouth daily. 08/29/20  Yes [provider]      VITAL SIGNS:  Blood pressure (!) 151/84, pulse 83, temperature 97.9 F (36.6 C), temperature source Oral, resp. rate 20, height 6' (1.829 m), weight 118.8 kg, SpO2 99 %.  PHYSICAL EXAMINATION:  Physical Exam  GENERAL:  65 y.o.-year-old African-American male patient lying in the bed with no acute distress.  He was very somnolent but arousable. EYES: Pupils equal, round, reactive to light and accommodation. No scleral icterus. Extraocular muscles intact.  HEENT: Head atraumatic, normocephalic. Oropharynx and nasopharynx clear.  NECK:  Supple, no jugular venous distention. No thyroid enlargement, no tenderness.  LUNGS: Normal breath sounds bilaterally, no wheezing, rales,rhonchi or crepitation. No use of accessory muscles of respiration.  CARDIOVASCULAR: Regular rate and rhythm, S1, S2 normal. No murmurs, rubs, or gallops.  ABDOMEN: Soft, nondistended, nontender. Bowel sounds present. No organomegaly or mass.  EXTREMITIES: Trace bilateral lower extremity pitting edema, with no cyanosis, or clubbing.  NEUROLOGIC: Cranial nerves II through XII are intact except for expressive dysphasia and dysarthria and minimal left facial droop. Muscle strength 4/5 in all extremities. Sensation intact. Gait not checked.  PSYCHIATRIC: The patient is somnolent but arousable.  SKIN: No obvious rash, lesion, or ulcer.   LABORATORY PANEL:   CBC Recent Labs  Lab 10/05/20 2002  WBC 5.3  HGB 13.2  HCT 40.5  PLT 277   ------------------------------------------------------------------------------------------------------------------  Chemistries  Recent Labs  Lab 10/05/20 2002  NA 135  K 3.7  CL 100  CO2 27  GLUCOSE 260*  BUN 14  CREATININE 1.07  CALCIUM 9.1  AST 27  ALT 14  ALKPHOS 70  BILITOT 0.6   ------------------------------------------------------------------------------------------------------------------  Cardiac Enzymes No  results for input(s): TROPONINI in the last 168 hours. ------------------------------------------------------------------------------------------------------------------  RADIOLOGY:  DG Chest 2 View  Result Date: 10/06/2020 CLINICAL DATA:  Slurred speech and weakness EXAM: CHEST - 2 VIEW COMPARISON:  None. FINDINGS: The heart size and mediastinal contours are within normal limits. Both lungs are clear. The visualized skeletal structures are unremarkable. IMPRESSION: No active cardiopulmonary disease. Electronically Signed   By: Alcide Clever M.D.   On: 10/06/2020 01:57   CT HEAD WO CONTRAST  Result Date: 10/05/2020 CLINICAL DATA:  Neuro deficit, acute, stroke suspected. Slurred speech and double vision for the past 8 hours. EXAM: CT HEAD WITHOUT CONTRAST TECHNIQUE: Contiguous axial images were obtained from the base of the skull through the vertex without intravenous contrast. COMPARISON:  None. FINDINGS: Brain: Chronic small vessel ischemic changes within the bilateral periventricular white matter regions. Small chronic-appearing infarct within the LEFT cerebellum. Additional low-density areas within the LEFT thalamus and RIGHT frontal lobe, compatible with subacute to chronic infarcts, favor chronic. No parenchymal mass or hemorrhage. No mass effect, midline shift or herniation seen. Vascular: Chronic calcified atherosclerotic changes of the large vessels at the skull base. No unexpected hyperdense vessel. Skull: Normal. Negative for fracture or focal lesion. Sinuses/Orbits: No acute finding. Other: None. IMPRESSION: 1. Low-density areas within the LEFT thalamus and RIGHT frontal lobe, compatible with subacute to chronic infarcts,  favor chronic. 2. Additional chronic ischemic changes within the white matter regions and LEFT cerebellum. 3. No intracranial hemorrhage. No significant mass effect, midline shift or herniation. Electronically Signed   By: Bary Richard M.D.   On: 10/05/2020 20:21       IMPRESSION AND PLAN:  Active Problems:   TIA (transient ischemic attack)  1.  Acute CVA, with expressive dysphasia and left facial droop. - The patient will be admitted to an observation medically monitored bed. - We will follow neurochecks every 4 hours for 24 hours. - Brain MRI with MRA came back positive for small acute infarct of the left paramedian frontal lobe and ventral medial left thalamus with short segment occlusion of the left ACA A2 segment and moderate narrowing of the right ACA proximal A3 segment. - Neurology consult to be obtained. - I notified Dr. Otelia Limes about the patient. - The patient will be on aspirin and statin therapy. - PT/OT and ST consults will be obtained.  2.  Mildly uncontrolled type 2 diabetes mellitus with hyperglycemia. - The patient will be placed on supplement coverage with NovoLog. - We will continue Amaryl and Actos.  3.  Elevated troponin I. - Cardiology consult to be obtained. - This could be secondary to CVA.  4.  Essential hypertension. - We will continue Cozaar with permissive hypertension especially during the first 24 hours.  DVT prophylaxis: Lovenox. Code Status: full code. Family Communication:  The plan of care was discussed in details with the patient (and family). I answered all questions. The patient agreed to proceed with the above mentioned plan. Further management will depend upon hospital course. Disposition Plan: Back to previous home environment Consults called: Cardiology and neurology. All the records are reviewed and case discussed with ED provider.  Status is: Observation  The patient remains OBS appropriate and will d/c before 2 midnights.  Dispo: The patient is from: Home              Anticipated d/c is to: Home              Patient currently is not medically stable to d/c.   Difficult to place patient No   TOTAL TIME TAKING CARE OF THIS PATIENT: 55 minutes.    Hannah Beat M.D on 10/06/2020 at 2:43  AM  [facility]   From 7 PM-7 AM, contact night-coverage [email or web address]  CC: Primary care physician; Barbette Reichmann, MD

## 2020-10-06 NOTE — Progress Notes (Signed)
Inpatient Rehab Admissions Coordinator Note:   Per PT/OT recommendations, pt was screened for CIR candidacy by Wolfgang Phoenix, MS, CCC-SLP.  At this time we are not recommending an inpatient rehab consult. Note pt is under observation status at this time. Pt may not have the medical necessity to warrant an inpatient rehab stay if they remain observation.  If status were to change to inpatient, [facility] will screen for candidacy.  Please contact me with questions.    Wolfgang Phoenix, MS, CCC-SLP Admissions Coordinator [phone] 10/06/20 6:32 PM

## 2020-10-06 NOTE — ED Notes (Signed)
Patient transported to CT 

## 2020-10-06 NOTE — Evaluation (Addendum)
Physical Therapy Evaluation Patient Details Name: Raymond Kerr MRN: [id number] DOB: 1955/05/04 Today's Date: 10/06/2020   History of Present Illness  65 y.o. African-American male with medical history significant for type II diabetes mellitus, GERD and hypertension, who presented to the emergency room with acute onset of diplopia and generalized weakness that started 8 hours prior to presentation.  The patient has been having expressive aphasia. 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 No hemorrhage or mass effect."  Clinical Impression  Pt alert, fatigued with a friend present throughout treatment.Pt is disoriented to place and time. Pt's PLOF is independent without AD for all mobility, ADLs, IADLs and currently works as an Statistician, staying intermittently with his daugther throughout the year.   Pt demonstrates 5/5 strength bilateral LE with slight coordination deficits and motor control with ambulation. Pt is unable to fully open L eyelid but other motor components of CN III are intact. No other CN deficits, spasticity noted during evaluation. Pt requires min-guard w/ RW for sit <>stand from very elevated surface. Difficulty with coordinating stepping and RW placement. Min-A is required during ambulation w/o a RW due to right lateral trunk lean and decreased RLE motor control (tended to drag RLE, step to pattern noted). Pt requires increased processing time for tasks with decreased environmental awareness when walking. Due to change in PLOF and current deficits, CIR is recommended at discharge. Pt is motivated, and seems to have good family support. Skilled PT intervention is indicated to address deficits in function, mobility, and to return to PLOF as able.      Follow Up Recommendations CIR;Supervision for mobility/OOB    Equipment Recommendations  Other (comment) (TBD next venue of care)    Recommendations for  Other Services       Precautions / Restrictions Precautions Precautions: Fall Restrictions Weight Bearing Restrictions: No      Mobility  Bed Mobility Overal bed mobility: Needs Assistance Bed Mobility: Supine to Sit;Sit to Supine     Supine to sit: HOB elevated;Min guard Sit to supine: Min guard   General bed mobility comments: Increased time and effort when scooting and requires cues for UE usage during scooting and sit > supine transfer    Transfers Overall transfer level: Needs assistance Equipment used: Rolling walker (2 wheeled) Transfers: Sit to/from Stand Sit to Stand: Min guard         General transfer comment: Min-gaurd for safety  Ambulation/Gait Ambulation/Gait assistance: Min assist;Min guard Gait Distance (Feet): 50 Feet Assistive device: Rolling walker (2 wheeled);None Gait Pattern/deviations: Staggering right;Decreased dorsiflexion - right Gait velocity: decreased   General Gait Details: Pt ambulated in room w/o RW demonstrating increased R lateral trunk lean with decreased RLE motor control requiring min-A for trunk support w/ decreased enviornmental awareness. Stability is improved with RW requiring min-gaurd w/ decreased enviornmental awareness  Stairs            Wheelchair Mobility    Modified Rankin (Stroke Patients Only)       Balance Overall balance assessment: Needs assistance Sitting-balance support: No upper extremity supported;Feet supported Sitting balance-Leahy Scale: Fair       Standing balance-Leahy Scale: Poor Standing balance comment: Pt requires BUE support for balance when standing                             Pertinent Vitals/Pain Pain Assessment: 0-10 Pain Score: 2  Pain Location:  L eyelid Pain Descriptors / Indicators: Tiring;Discomfort Pain Intervention(s): Limited activity within patient's tolerance;Monitored during session;Repositioned    Home Living Family/patient expects to be discharged  to:: Inpatient rehab     Type of Home: House           Additional Comments: 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 The home is a single-story home without steps to enter. Pt's friend indicates his daugther is disabled and unable to physically assist with any mobility.    Prior Function Level of Independence: Independent         Comments: Pt is a independent with ADLs, IADLs and intermittently unloads trunks but mainly drives.     Hand Dominance   Dominant Hand: Right    Extremity/Trunk Assessment   Upper Extremity Assessment Upper Extremity Assessment: RUE deficits/detail;LUE deficits/detail RUE Deficits / Details: Decreased R shoulder shrug inititally, but able to hold against gravity and 5/5 MMT. RUE Sensation: WNL (SILT BUE) RUE Coordination:  (L > R mild dysmetria) LUE Deficits / Details: MMT 5/5 gross strength LUE Sensation: WNL (SILT) LUE Coordination:  (slight dysmetria)    Lower Extremity Assessment Lower Extremity Assessment: RLE deficits/detail;LLE deficits/detail RLE Deficits / Details: Strength MMT 5/5 hip flexion, knee ext/flex, dorsiflexion; clonus: negative RLE Sensation: WNL RLE Coordination:  (heel-to-shin: negative; alternative toe taps: positive) LLE Deficits / Details: Strength MMT 5/5 hip flexion, knee ext/flex, dorsiflexion; clonus: negative LLE Sensation: WNL LLE Coordination:  (WNL)       Communication   Communication: Expressive difficulties (Pt responds to yes/no or simple questions accurately ~ 90% of the time. Pt demonstrates incorrect word finding ~ 10% of the time.)  Cognition Arousal/Alertness: Awake/alert Behavior During Therapy: Flat affect Overall Cognitive Status: Impaired/Different from baseline Area of Impairment: Problem solving;Orientation                 Orientation Level: Place;Time;Disoriented to           Problem Solving: Slow  processing General Comments: Follows one-step commands consistentlywith increased time for processing      General Comments      Exercises Other Exercises Other Exercises: Pt educated in post-stroke recovery and therapy   Assessment/Plan    PT Assessment Patient needs continued PT services  PT Problem List Decreased strength;Decreased range of motion;Decreased activity tolerance;Decreased balance;Decreased mobility;Decreased coordination;Decreased cognition       PT Treatment Interventions Gait training;Balance training;Neuromuscular re-education;Stair training;Functional mobility training;Therapeutic activities;Therapeutic exercise    PT Goals (Current goals can be found in the Care Plan section)  Acute Rehab PT Goals Patient Stated Goal: To feel better PT Goal Formulation: With patient Time For Goal Achievement: 10/20/20 Potential to Achieve Goals: Good    Frequency 7X/week   Barriers to discharge        Co-evaluation               AM-PAC PT "6 Clicks" Mobility  Outcome Measure Help needed turning from your back to your side while in a flat bed without using bedrails?: A Little Help needed moving from lying on your back to sitting on the side of a flat bed without using bedrails?: A Little Help needed moving to and from a bed to a chair (including a wheelchair)?: A Little Help needed standing up from a chair using your arms (e.g., wheelchair or bedside chair)?: A Little Help needed to walk in hospital room?: A Lot Help needed climbing  3-5 steps with a railing? : A Lot 6 Click Score: 16    End of Session Equipment Utilized During Treatment: Gait belt Activity Tolerance: Patient tolerated treatment well;Patient limited by fatigue Patient left: in bed;with family/visitor present;with call bell/phone within reach Nurse Communication: Mobility status PT Visit Diagnosis: Unsteadiness on feet (R26.81);Other abnormalities of gait and mobility (R26.89);Muscle weakness  (generalized) (M62.81);Other symptoms and signs involving the nervous system (R29.898)    Time: 0973-5329 PT Time Calculation (min) (ACUTE ONLY): 35 min   Charges:             [employer], SPT

## 2020-10-06 NOTE — Evaluation (Signed)
Occupational Therapy Evaluation Patient Details Name: Raymond Kerr MRN: [id number] DOB: [birth year] Today's Date: 10/06/2020    History of Present Illness 65 y.o. African-American male with medical history significant for type II diabetes mellitus, GERD and hypertension, who presented to the emergency room with acute onset of diplopia and generalized weakness that started 8 hours prior to presentation.  The patient has been having expressive aphasia. 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 No hemorrhage or mass effect."   Clinical Impression   Pt seen for OT evaluation this date. 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 Per friend present (pt/friend introduce her as a friend but chart review indicates she is his girlfriend or fiance), the pt only comes to visit his daughter and grandson who live locally for a few days every other month. Pt oriented x4, following commands + processing time, demonstrating impairments in R side strength, coordination, visual deficits (L eye, diplopia, side by side, improves with L eye closed), mild speech difficulties (intermittent slur, increased time to speak), and impaired balance. Pt required MIN A for bed mobility and ADL transfers without AD. Pt is R hand dominant and demonstrates ability to hold food and drink items to self feed/drink. No s/s aspiration. Pt requires MOD A for seated LB ADL. Pt is eager to return to his PLOF with increased independence. Pt educated in post-stroke recovery, visual tracking activity. No sensory deficits noted with assessment on this date. Pt would benefit from skilled OT to address noted impairments and functional limitations (see below for any additional details) in order to maximize safety and independence while minimizing falls risk and caregiver  burden.  Upon hospital discharge, recommend pt discharge to CIR to maximize safety and return to PLOF.    Follow Up Recommendations  CIR    Equipment Recommendations  Other (comment) (2WW)    Recommendations for Other Services       Precautions / Restrictions Precautions Precautions: Fall Restrictions Weight Bearing Restrictions: No      Mobility Bed Mobility Overal bed mobility: Needs Assistance Bed Mobility: Supine to Sit;Sit to Supine     Supine to sit: Min assist;HOB elevated Sit to supine: Min guard   General bed mobility comments: increased time/effort, Min A for trunk support    Transfers Overall transfer level: Needs assistance Equipment used: None Transfers: Sit to/from Stand Sit to Stand: Min assist              Balance Overall balance assessment: Needs assistance Sitting-balance support: No upper extremity supported;Feet supported Sitting balance-Leahy Scale: Fair       Standing balance-Leahy Scale: Poor Standing balance comment: requires MIN A to maintain standing balance                           ADL either performed or assessed with clinical judgement   ADL Overall ADL's : Needs assistance/impaired                                       General ADL Comments: Pt requires MOD A for LB ADL, set up for self feeding, MIN A for ADL transfers     Vision Baseline Vision/History: No visual deficits Patient Visual  Report: Blurring of vision;Diplopia Vision Assessment?: Yes Eye Alignment: Within Functional Limits Ocular Range of Motion: Impaired-to be further tested in functional context (R eye WFL, L eye concerning for decreased ROM, eye lid droops and pt has trouble keeping eye open') Alignment/Gaze Preference: Within Defined Limits Tracking/Visual Pursuits: Unable to hold eye position out of midline;Left eye does not track medially;Impaired - to be further tested in functional context;Requires cues, head turns, or add  eye shifts to track (difficulty with L eye tracking medial>lateral, appears to have difficulty with visual tracking on R side past midline) Convergence: Other (comment) (pt demo's ability to read up close and then far away) Visual Fields: No apparent deficits Diplopia Assessment: Disappears with one eye closed;Objects split side to side     Perception     Praxis      Pertinent Vitals/Pain Pain Assessment: No/denies pain     Hand Dominance Right   Extremity/Trunk Assessment Upper Extremity Assessment Upper Extremity Assessment: RUE deficits/detail;LUE deficits/detail RUE Deficits / Details: slight pronator drift, no sensory deficits, decr FMC with thumb opposition/RAM, mild difficulty with finger to nose bilaterally (2/2 visual deficits); pt endorses pain with shoulder flexion MMT, endorses possible hx of shoulder injury LUE Deficits / Details: 5/5 strength, no sensory deficits, FMC WFL, mild difficulty with finger to nose bilaterally (2/2 visual deficits)   Lower Extremity Assessment Lower Extremity Assessment: RLE deficits/detail;LLE deficits/detail RLE Deficits / Details: grossly 4+/5, no sensory deficits, FMC WFL LLE Deficits / Details: 5/5 strength, no sensory deficits, [facility]       Communication Communication Communication: Expressive difficulties;Other (comment) (mild slurring noted, pt has tendency to answer more yes/no but when encouraged will speak in full sentences)   Cognition Arousal/Alertness: Awake/alert Behavior During Therapy: WFL for tasks assessed/performed Overall Cognitive Status: Impaired/Different from baseline Area of Impairment: Problem solving                             Problem Solving: Slow processing General Comments: alert and oriented, follows commands, increased time/effort for processing   General Comments       Exercises Other Exercises Other Exercises: Pt educated in post-stroke recovery and therapy   Shoulder Instructions       Home Living Family/patient expects to be discharged to:: Unsure                                 Additional Comments: 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 Friend reports he may have family he can stay with but really wants pt to go to rehab first.      Prior Functioning/Environment Level of Independence: Independent        Comments: Full time trucker, independent at baseline.        OT Problem List: Decreased strength;Decreased coordination;Decreased cognition;Decreased safety awareness;Decreased knowledge of use of DME or AE;Impaired balance (sitting and/or standing);Impaired vision/perception;Impaired UE functional use      OT Treatment/Interventions: Self-care/ADL training;Therapeutic exercise;Therapeutic activities;Cognitive remediation/compensation;Neuromuscular education;Visual/perceptual remediation/compensation;DME and/or AE instruction;Balance training;Patient/family education    OT Goals(Current goals can be found in the care plan section) Acute Rehab OT Goals Patient Stated Goal: get better OT Goal Formulation: With patient/family Time For Goal Achievement: 10/20/20 Potential to Achieve Goals: Good ADL Goals Pt Will Perform Lower Body Dressing: sit to/from  stand;with min assist Pt Will Transfer to Toilet: with min guard assist;ambulating (elevated commode, LRAD PRN) Additional ADL Goal #1: Pt will perform grooming tasks standing at sink LRAD PRN, requiring supervision for safety. Additional ADL Goal #2: Pt will perform visual scanning activity with >75% accuracy, supervision and VC PRN.  OT Frequency: Min 3X/week   Barriers to D/C:            Co-evaluation              AM-PAC OT "6 Clicks" Daily Activity     Outcome Measure Help from another person eating meals?: None Help from another person taking care of personal grooming?: A  Little Help from another person toileting, which includes using toliet, bedpan, or urinal?: A Little Help from another person bathing (including washing, rinsing, drying)?: A Lot Help from another person to put on and taking off regular upper body clothing?: A Little Help from another person to put on and taking off regular lower body clothing?: A Lot 6 Click Score: 17   End of Session    Activity Tolerance: Patient tolerated treatment well Patient left: in bed;with call bell/phone within reach;with family/visitor present  OT Visit Diagnosis: Other abnormalities of gait and mobility (R26.89);Hemiplegia and hemiparesis Hemiplegia - Right/Left: Right Hemiplegia - dominant/non-dominant: Dominant Hemiplegia - caused by: Cerebral infarction                Time: 7672-0947 OT Time Calculation (min): 44 min Charges:  OT General Charges $OT Visit: 1 Visit OT Evaluation $OT Eval Moderate Complexity: 1 Mod OT Treatments $Therapeutic Activity: 8-22 mins  Wynona Canes, MPH, MS, OTR/L ascom [phone] 10/06/20, 1:06 PM

## 2020-10-06 NOTE — ED Notes (Signed)
New breakfast tray ordered for patient

## 2020-10-06 NOTE — Plan of Care (Signed)
  Problem: Education: Goal: Ability to describe self-care measures that may prevent or decrease complications (Diabetes Survival Skills Education) will improve Outcome: Progressing Goal: Individualized Educational Video(s) Outcome: Progressing   Problem: Education: Goal: Knowledge of disease or condition will improve Outcome: Progressing Goal: Knowledge of secondary prevention will improve Outcome: Progressing

## 2020-10-06 NOTE — Progress Notes (Signed)
*  PRELIMINARY RESULTS* Echocardiogram 2D Echocardiogram has been performed.  Cristela Blue 10/06/2020, 8:33 AM

## 2020-10-06 NOTE — Progress Notes (Signed)
PHARMACIST - PHYSICIAN COMMUNICATION  CONCERNING:  Enoxaparin (Lovenox) for DVT Prophylaxis    RECOMMENDATION: Patient was prescribed enoxaprin 40mg  q24 hours for VTE prophylaxis.   Filed Weights   10/05/20 1950  Weight: 118.8 kg (262 lb)    Body mass index is 35.53 kg/m.  Estimated Creatinine Clearance: 91.6 mL/min (by C-G formula based on SCr of 1.07 mg/dL).   Based on [facility] policy patient is candidate for enoxaparin 0.5mg /kg TBW SQ every 24 hours based on BMI being >30.  DESCRIPTION: Pharmacy has adjusted enoxaparin dose per [facility] policy.  Patient is now receiving enoxaparin 0.5 mg/kg every 24 hours   [facility], PharmD, [facility] 10/06/2020 2:59 AM

## 2020-10-06 NOTE — Progress Notes (Addendum)
Inpatient Diabetes Program Recommendations  AACE/ADA: New Consensus Statement on Inpatient Glycemic Control (2015)  Target Ranges:  Prepandial:   less than 140 mg/dL      Peak postprandial:   less than 180 mg/dL (1-2 hours)      Critically ill patients:  140 - 180 mg/dL   Results for CIRO, TASHIRO (MRN [id number]) as of 10/06/2020 09:41  Ref. Range 10/05/2020 19:42 10/06/2020 03:22 10/06/2020 09:17  Glucose-Capillary Latest Ref Range: 70 - 99 mg/dL 303 (H) 214 (H)  5 units NOVOLOG  134 (H)   Results for RALLY, OUCH (MRN [id number]) as of 10/06/2020 09:41  Ref. Range 10/05/2020 20:02  Hemoglobin A1C Latest Ref Range: 4.8 - 5.6 % 10.3 (H)  (248 mg/dl)   Admit with: Acute CVA, with expressive dysphasia and left facial droop  History: DM  Home DM Meds: Amaryl 4 mg daily       Actos 30 mg daily  Current Orders: Novolog Moderate Correction Scale/ SSI (0-15 units) Q4 hours      Amaryl 4 mg daily      Actos 30 mg daily   PCP: Dr. Ginette Pitman with [facility] Last seen 10/01/2020  Note Novolog SSi started this AM  Needs better glucose control at home--Current A1c= 10.3% and pt admitted with CVA   Addendum 3:30pm--Met w/ pt and fiancee at bedside.  Pt was able to tell me he has had diabetes for 20 years.  Could not remember the names of the diabetes meds he takes.  Was eating while I was in the room but was not verbalizing much and seemed frustrated while trying to remember information.  Will re-attempt visit tomorrow 08/16.    --Will follow patient during hospitalization--  Wyn Quaker RN, MSN, CDE Diabetes Coordinator Inpatient Glycemic Control Team Team Pager: [phone] (8a-5p)

## 2020-10-06 NOTE — ED Notes (Signed)
Pt remains off unit for MRI scans

## 2020-10-06 NOTE — ED Notes (Signed)
  CBG 142  

## 2020-10-06 NOTE — Progress Notes (Signed)
PROGRESS NOTE    Raymond Kerr  IFO:[id number] DOB: 09/18/1955 DOA: 10/06/2020 PCP: Barbette Reichmann, MD    Brief Narrative:  Raymond Kerr is a 65 y.o. African-American male with medical history significant for type II diabetes mellitus, GERD and hypertension, who presented to the emergency room with acute onset of diplopia and generalized weakness that started 8 hours prior to presentation.  The patient has been having expressive dysphasia. On arrival to the emergency room here it appears that the patient has left-sided weakness and difficulty with speech.  MRI of the brain showed small acute infarcts of the left paramedian frontal lobe and ventromedial left thalamus. Neurology consult has been obtained. MRA head showed a short segment of the left ACA A2 segment.  Assessment & Plan:   Active Problems:   TIA (transient ischemic attack)  #1.  Acute left ACA ischemic stroke. Patient has a left-sided weakness and left-sided facial droop.  But MRI showed left-sided AC stroke.  MRA also showed stenosis in the ACA A2 and A3 segment. Pending neurology evaluation. Continue telemetry to rule out arrhythmia. Continue Lipitor, aspirin Plavix.  Obtain PT/OT. Lovenox for prophylaxis.  2.  Type 2 diabetes.   continue current regimen.  #3.  Hypokalemia.  Repleted.   DVT prophylaxis: Lovenox Code Status: full Family Communication: Wife at bedside Disposition Plan:    Status is: Observation    Dispo: The patient is from: Home              Anticipated d/c is to: ??              Patient currently is not medically stable to d/c.   Difficult to place patient No        No intake/output data recorded. No intake/output data recorded.     Consultants:  Neurology  Procedures: None  Antimicrobials: None   Subjective: Patient is still sleepy, still has some slurred speech.  No facial droop. No short of breath or cough. Abdominal pain nausea vomiting  Objective: Vitals:    10/06/20 1030 10/06/20 1100 10/06/20 1130 10/06/20 1133  BP: 128/79 133/80 (!) 150/80   Pulse: 78 80 83   Resp: 16 15 (!) 21   Temp:    98.2 F (36.8 C)  TempSrc:    Oral  SpO2: 98% 100% 100%   Weight:      Height:       No intake or output data in the 24 hours ending 10/06/20 1357 Filed Weights   10/05/20 1950  Weight: 118.8 kg    Examination:  General exam: Appears calm and comfortable  Respiratory system: Clear to auscultation. Respiratory effort normal. Cardiovascular system: S1 & S2 heard, RRR. No JVD, murmurs, rubs, gallops or clicks. No pedal edema. Gastrointestinal system: Abdomen is nondistended, soft and nontender. No organomegaly or masses felt. Normal bowel sounds heard. Central nervous system: Alert and oriented. No focal neurological deficits. Extremities: Symmetric 5 x 5 power. Skin: No rashes, lesions or ulcers Psychiatry: Judgement and insight appear normal. Mood & affect appropriate.     Data Reviewed: I have personally reviewed following labs and imaging studies  CBC: Recent Labs  Lab 10/05/20 2002 10/06/20 0818  WBC 5.3 6.1  NEUTROABS 2.9  --   HGB 13.2 10.4*  HCT 40.5 30.9*  MCV 89.4 90.6  PLT 277 265   Basic Metabolic Panel: Recent Labs  Lab 10/05/20 2002 10/06/20 0818  NA 135 136  K 3.7 3.2*  CL 100 101  CO2 27 26  GLUCOSE 260* 167*  BUN 14 13  CREATININE 1.07 1.09  CALCIUM 9.1 8.5*   GFR: Estimated Creatinine Clearance: 89.9 mL/min (by C-G formula based on SCr of 1.09 mg/dL). Liver Function Tests: Recent Labs  Lab 10/05/20 2002  AST 27  ALT 14  ALKPHOS 70  BILITOT 0.6  PROT 7.4  ALBUMIN 3.3*   No results for input(s): LIPASE, AMYLASE in the last 168 hours. No results for input(s): AMMONIA in the last 168 hours. Coagulation Profile: Recent Labs  Lab 10/05/20 2002  INR 1.0   Cardiac Enzymes: No results for input(s): CKTOTAL, CKMB, CKMBINDEX, TROPONINI in the last 168 hours. BNP (last 3 results) No results for  input(s): PROBNP in the last 8760 hours. HbA1C: Recent Labs    10/05/20 2002 10/06/20 0818  HGBA1C 10.3* 10.1*   CBG: Recent Labs  Lab 10/05/20 1942 10/06/20 0322 10/06/20 0917 10/06/20 1154  GLUCAP 303* 214* 134* 142*   Lipid Profile: Recent Labs    10/05/20 2002 10/06/20 0818  CHOL 199 174  HDL 35* 30*  LDLCALC 136* 161*  TRIG 138 102  CHOLHDL 5.7 5.8   Thyroid Function Tests: No results for input(s): TSH, T4TOTAL, FREET4, T3FREE, THYROIDAB in the last 72 hours. Anemia Panel: No results for input(s): VITAMINB12, FOLATE, FERRITIN, TIBC, IRON, RETICCTPCT in the last 72 hours. Sepsis Labs: No results for input(s): PROCALCITON, LATICACIDVEN in the last 168 hours.  Recent Results (from the past 240 hour(s))  Resp Panel by RT-PCR (Flu A&B, Covid) Nasopharyngeal Swab     Status: None   Collection Time: 10/06/20  1:23 AM   Specimen: Nasopharyngeal Swab; Nasopharyngeal(NP) swabs in vial transport medium  Result Value Ref Range Status   SARS Coronavirus 2 by RT PCR NEGATIVE NEGATIVE Final    Comment: (NOTE) SARS-CoV-2 target nucleic acids are NOT DETECTED.  The SARS-CoV-2 RNA is generally detectable in upper respiratory specimens during the acute phase of infection. The lowest concentration of SARS-CoV-2 viral copies this assay can detect is 138 copies/mL. A negative result does not preclude SARS-Cov-2 infection and should not be used as the sole basis for treatment or other patient management decisions. A negative result may occur with  improper specimen collection/handling, submission of specimen other than nasopharyngeal swab, presence of viral mutation(s) within the areas targeted by this assay, and inadequate number of viral copies(<138 copies/mL). A negative result must be combined with clinical observations, patient history, and epidemiological information. The expected result is Negative.  Fact Sheet for Patients:   BloggerCourse.com  Fact Sheet for Healthcare Providers:  SeriousBroker.it  This test is no t yet approved or cleared by the [place] FDA and  has been authorized for detection and/or diagnosis of SARS-CoV-2 by FDA under an Emergency Use Authorization (EUA). This EUA will remain  in effect (meaning this test can be used) for the duration of the COVID-19 declaration under Section 564(b)(1) of the Act, 21 U.S.C.section 360bbb-3(b)(1), unless the authorization is terminated  or revoked sooner.       Influenza A by PCR NEGATIVE NEGATIVE Final   Influenza B by PCR NEGATIVE NEGATIVE Final    Comment: (NOTE) The Xpert Xpress SARS-CoV-2/FLU/RSV plus assay is intended as an aid in the diagnosis of influenza from Nasopharyngeal swab specimens and should not be used as a sole basis for treatment. Nasal washings and aspirates are unacceptable for Xpert Xpress SARS-CoV-2/FLU/RSV testing.  Fact Sheet for Patients: BloggerCourse.com  Fact Sheet for Healthcare Providers: SeriousBroker.it  This test is not yet approved  or cleared by the [place] FDA and has been authorized for detection and/or diagnosis of SARS-CoV-2 by FDA under an Emergency Use Authorization (EUA). This EUA will remain in effect (meaning this test can be used) for the duration of the COVID-19 declaration under Section 564(b)(1) of the Act, 21 U.S.C. section 360bbb-3(b)(1), unless the authorization is terminated or revoked.  Performed at [facility], [address] [place], [place] [address]          Radiology Studies: DG Chest 2 View  Result Date: 10/06/2020 CLINICAL DATA:  Slurred speech and weakness EXAM: CHEST - 2 VIEW COMPARISON:  None. FINDINGS: The heart size and mediastinal contours are within normal limits. Both lungs are clear. The visualized skeletal structures are unremarkable.  IMPRESSION: No active cardiopulmonary disease. Electronically Signed   By: Alcide CleverMark  Lukens M.D.   On: 10/06/2020 01:57   CT HEAD WO CONTRAST  Result Date: 10/05/2020 CLINICAL DATA:  Neuro deficit, acute, stroke suspected. Slurred speech and double vision for the past 8 hours. EXAM: CT HEAD WITHOUT CONTRAST TECHNIQUE: Contiguous axial images were obtained from the base of the skull through the vertex without intravenous contrast. COMPARISON:  None. FINDINGS: Brain: Chronic small vessel ischemic changes within the bilateral periventricular white matter regions. Small chronic-appearing infarct within the LEFT cerebellum. Additional low-density areas within the LEFT thalamus and RIGHT frontal lobe, compatible with subacute to chronic infarcts, favor chronic. No parenchymal mass or hemorrhage. No mass effect, midline shift or herniation seen. Vascular: Chronic calcified atherosclerotic changes of the large vessels at the skull base. No unexpected hyperdense vessel. Skull: Normal. Negative for fracture or focal lesion. Sinuses/Orbits: No acute finding. Other: None. IMPRESSION: 1. Low-density areas within the LEFT thalamus and RIGHT frontal lobe, compatible with subacute to chronic infarcts, favor chronic. 2. Additional chronic ischemic changes within the white matter regions and LEFT cerebellum. 3. No intracranial hemorrhage. No significant mass effect, midline shift or herniation. Electronically Signed   By: Bary RichardStan  Maynard M.D.   On: 10/05/2020 20:21   MR ANGIO HEAD WO CONTRAST  Result Date: 10/06/2020 CLINICAL DATA:  Seizure, slurred speech and double vision. EXAM: MRI HEAD WITHOUT CONTRAST MRA HEAD WITHOUT CONTRAST MRA NECK WITHOUT CONTRAST TECHNIQUE: Multiplanar, multiecho pulse sequences of the brain and surrounding structures were obtained without intravenous contrast. Angiographic images of the Circle of Willis were obtained using MRA technique without intravenous contrast. Angiographic images of the neck were  obtained using MRA technique without intravenous contrast. Carotid stenosis measurements (when applicable) are obtained utilizing NASCET criteria, using the distal internal carotid diameter as the denominator. COMPARISON:  None. FINDINGS: MRI HEAD FINDINGS Brain: Small acute infarcts of the left paramedian frontal lobe and ventral medial left thalamus. Multiple smaller foci of acute ischemia within the left anterior cerebral artery distribution. No acute hemorrhage. No chronic microhemorrhage. 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 There is multifocal hyperintense T2-weighted signal within the white matter. Parenchymal volume and CSF spaces are normal. The midline structures are normal. Vascular: Major flow voids are preserved. Skull and upper cervical spine: Normal calvarium and skull base. Visualized upper cervical spine and soft tissues are normal. Sinuses/Orbits:No paranasal sinus fluid levels or advanced mucosal thickening. No mastoid or middle ear effusion. Normal orbits. MRA HEAD FINDINGS POSTERIOR CIRCULATION: --Vertebral arteries: Normal --Inferior cerebellar arteries: Normal. --Basilar artery: Normal. --Superior cerebellar arteries: Normal. --Posterior cerebral arteries: Normal. ANTERIOR CIRCULATION: --Intracranial internal carotid arteries: Normal. --Anterior cerebral arteries (ACA): There is occlusion of a short segment of the left ACA  A2 segment. There is moderate narrowing of the right ACA proximal pericallosal portion. --Middle cerebral arteries (MCA): Normal. ANATOMIC VARIANTS: None MRA NECK FINDINGS Motion degraded time-of-flight imaging of the carotid and vertebral arteries is unremarkable. There is no visible stenosis or occlusion. IMPRESSION: 1. Small acute infarcts of the left paramedian frontal lobe and ventromedial left thalamus. No hemorrhage or mass effect. 2. 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 3. Moderate narrowing of the right ACA proximal A3 segment. 4. Motion  degraded time-of-flight MRA of the neck without visible abnormality. Electronically Signed   By: Deatra Robinson M.D.   On: 10/06/2020 03:37   MR ANGIO NECK WO CONTRAST  Result Date: 10/06/2020 CLINICAL DATA:  Seizure, slurred speech and double vision. EXAM: MRI HEAD WITHOUT CONTRAST MRA HEAD WITHOUT CONTRAST MRA NECK WITHOUT CONTRAST TECHNIQUE: Multiplanar, multiecho pulse sequences of the brain and surrounding structures were obtained without intravenous contrast. Angiographic images of the Circle of Willis were obtained using MRA technique without intravenous contrast. Angiographic images of the neck were obtained using MRA technique without intravenous contrast. Carotid stenosis measurements (when applicable) are obtained utilizing NASCET criteria, using the distal internal carotid diameter as the denominator. COMPARISON:  None. FINDINGS: MRI HEAD FINDINGS Brain: Small acute infarcts of the left paramedian frontal lobe and ventral medial left thalamus. Multiple smaller foci of acute ischemia within the left anterior cerebral artery distribution. No acute hemorrhage. No chronic microhemorrhage. 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 There is multifocal hyperintense T2-weighted signal within the white matter. Parenchymal volume and CSF spaces are normal. The midline structures are normal. Vascular: Major flow voids are preserved. Skull and upper cervical spine: Normal calvarium and skull base. Visualized upper cervical spine and soft tissues are normal. Sinuses/Orbits:No paranasal sinus fluid levels or advanced mucosal thickening. No mastoid or middle ear effusion. Normal orbits. MRA HEAD FINDINGS POSTERIOR CIRCULATION: --Vertebral arteries: Normal --Inferior cerebellar arteries: Normal. --Basilar artery: Normal. --Superior cerebellar arteries: Normal. --Posterior cerebral arteries: Normal. ANTERIOR CIRCULATION: --Intracranial internal carotid arteries: Normal. --Anterior cerebral arteries (ACA): There  is occlusion of a short segment of the left ACA A2 segment. There is moderate narrowing of the right ACA proximal pericallosal portion. --Middle cerebral arteries (MCA): Normal. ANATOMIC VARIANTS: None MRA NECK FINDINGS Motion degraded time-of-flight imaging of the carotid and vertebral arteries is unremarkable. There is no visible stenosis or occlusion. IMPRESSION: 1. Small acute infarcts of the left paramedian frontal lobe and ventromedial left thalamus. No hemorrhage or mass effect. 2. 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 3. Moderate narrowing of the right ACA proximal A3 segment. 4. Motion degraded time-of-flight MRA of the neck without visible abnormality. Electronically Signed   By: Deatra Robinson M.D.   On: 10/06/2020 03:37   MR BRAIN WO CONTRAST  Result Date: 10/06/2020 CLINICAL DATA:  Seizure, slurred speech and double vision. EXAM: MRI HEAD WITHOUT CONTRAST MRA HEAD WITHOUT CONTRAST MRA NECK WITHOUT CONTRAST TECHNIQUE: Multiplanar, multiecho pulse sequences of the brain and surrounding structures were obtained without intravenous contrast. Angiographic images of the Circle of Willis were obtained using MRA technique without intravenous contrast. Angiographic images of the neck were obtained using MRA technique without intravenous contrast. Carotid stenosis measurements (when applicable) are obtained utilizing NASCET criteria, using the distal internal carotid diameter as the denominator. COMPARISON:  None. FINDINGS: MRI HEAD FINDINGS Brain: Small acute infarcts of the left paramedian frontal lobe and ventral medial left thalamus. Multiple smaller foci of acute ischemia within the left anterior cerebral artery  distribution. No acute hemorrhage. No chronic microhemorrhage. 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 There is multifocal hyperintense T2-weighted signal within the white matter. Parenchymal volume and CSF spaces are normal. The midline structures are normal. Vascular: Major  flow voids are preserved. Skull and upper cervical spine: Normal calvarium and skull base. Visualized upper cervical spine and soft tissues are normal. Sinuses/Orbits:No paranasal sinus fluid levels or advanced mucosal thickening. No mastoid or middle ear effusion. Normal orbits. MRA HEAD FINDINGS POSTERIOR CIRCULATION: --Vertebral arteries: Normal --Inferior cerebellar arteries: Normal. --Basilar artery: Normal. --Superior cerebellar arteries: Normal. --Posterior cerebral arteries: Normal. ANTERIOR CIRCULATION: --Intracranial internal carotid arteries: Normal. --Anterior cerebral arteries (ACA): There is occlusion of a short segment of the left ACA A2 segment. There is moderate narrowing of the right ACA proximal pericallosal portion. --Middle cerebral arteries (MCA): Normal. ANATOMIC VARIANTS: None MRA NECK FINDINGS Motion degraded time-of-flight imaging of the carotid and vertebral arteries is unremarkable. There is no visible stenosis or occlusion. IMPRESSION: 1. Small acute infarcts of the left paramedian frontal lobe and ventromedial left thalamus. No hemorrhage or mass effect. 2. 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 3. Moderate narrowing of the right ACA proximal A3 segment. 4. Motion degraded time-of-flight MRA of the neck without visible abnormality. Electronically Signed   By: Deatra Robinson M.D.   On: 10/06/2020 03:37   ECHOCARDIOGRAM COMPLETE  Result Date: 10/06/2020    ECHOCARDIOGRAM REPORT   Patient Name:   Raymond Kerr Date of Exam: 10/06/2020 Medical Rec #:  [id number]     Height:       72.0 in Accession #:    [id number]    Weight:       262.0 lb Date of Birth:  1956-02-16    BSA:          2.390 m Patient Age:    64 years      BP:           119/78 mmHg Patient Gender: M             HR:           71 bpm. Exam Location:  [facility] Procedure: 2D Echo, Color Doppler and Cardiac Doppler Indications:     TIA G45.9  History:         Patient has no prior history of Echocardiogram examinations.                   Risk Factors:Diabetes and Hypertension. GERD.  Sonographer:     Cristela Blue Referring Phys:  [id number] JAN A MANSY Diagnosing Phys: Arnoldo Hooker MD  Sonographer Comments: Technically challenging study due to limited acoustic windows. The only view obtainable was parasternal. IMPRESSIONS  1. Left ventricular ejection fraction, by estimation, is 40 to 45%. The left ventricle has mildly decreased function. The left ventricle demonstrates regional wall motion abnormalities (see scoring diagram/findings for description). Left ventricular diastolic parameters were normal.  2. Right ventricular systolic function is normal. The right ventricular size is normal.  3. The mitral valve is normal in structure. Mild mitral valve regurgitation.  4. The aortic valve is normal in structure. Aortic valve regurgitation is not visualized. FINDINGS  Left Ventricle: Left ventricular ejection fraction, by estimation, is 40 to 45%. The left ventricle has mildly decreased function. The left ventricle demonstrates regional wall motion abnormalities. Moderate hypokinesis of the left ventricular, mid-apical anteroseptal wall. The left ventricular internal cavity size was normal in size. There is no left ventricular hypertrophy. Left ventricular  diastolic parameters were normal. Right Ventricle: The right ventricular size is normal. No increase in right ventricular wall thickness. Right ventricular systolic function is normal. Left Atrium: Left atrial size was normal in size. Right Atrium: Right atrial size was normal in size. Pericardium: There is no evidence of pericardial effusion. Mitral Valve: The mitral valve is normal in structure. Mild mitral valve regurgitation. Tricuspid Valve: The tricuspid valve is normal in structure. Tricuspid valve regurgitation is mild. Aortic Valve: The aortic valve is normal in structure. Aortic valve regurgitation is not visualized. Pulmonic Valve: The pulmonic valve was normal in structure.  Pulmonic valve regurgitation is not visualized. Aorta: The aortic root and ascending aorta are structurally normal, with no evidence of dilitation. IAS/Shunts: The interatrial septum was not assessed.  LEFT VENTRICLE PLAX 2D LVIDd:         4.30 cm LVIDs:         3.40 cm LV PW:         1.10 cm LV IVS:        1.20 cm LVOT diam:     2.00 cm LVOT Area:     3.14 cm  LEFT ATRIUM         Index LA diam:    2.90 cm 1.21 cm/m   AORTA Ao Root diam: 3.60 cm  SHUNTS Systemic Diam: 2.00 cm Arnoldo Hooker MD Electronically signed by Arnoldo Hooker MD Signature Date/Time: 10/06/2020/12:44:47 PM    Final         Scheduled Meds:   stroke: mapping our early stages of recovery book   Does not apply Once   atorvastatin  10 mg Oral Daily   enoxaparin (LOVENOX) injection  0.5 mg/kg Subcutaneous Q24H   glimepiride  4 mg Oral Q breakfast   insulin aspart  0-15 Units Subcutaneous Q4H   losartan  100 mg Oral Daily   pioglitazone  30 mg Oral Daily   potassium chloride  40 mEq Oral BID   Continuous Infusions:   LOS: 0 days    Time spent: No charge    Marrion Coy, MD [facility]   To contact the attending provider between 7A-7P or the covering provider during after hours 7P-7A, please log into the web site [email or web address] and access using universal [place] password for that web site. If you do not have the password, please call the hospital operator.  10/06/2020, 1:57 PM

## 2020-10-07 DIAGNOSIS — R7401 Elevation of levels of liver transaminase levels: Secondary | ICD-10-CM | POA: Diagnosis not present

## 2020-10-07 DIAGNOSIS — R29810 Facial weakness: Secondary | ICD-10-CM | POA: Diagnosis present

## 2020-10-07 DIAGNOSIS — R1319 Other dysphagia: Secondary | ICD-10-CM

## 2020-10-07 DIAGNOSIS — H532 Diplopia: Secondary | ICD-10-CM | POA: Diagnosis not present

## 2020-10-07 DIAGNOSIS — Z7982 Long term (current) use of aspirin: Secondary | ICD-10-CM | POA: Diagnosis not present

## 2020-10-07 DIAGNOSIS — Z79899 Other long term (current) drug therapy: Secondary | ICD-10-CM | POA: Diagnosis not present

## 2020-10-07 DIAGNOSIS — L89152 Pressure ulcer of sacral region, stage 2: Secondary | ICD-10-CM | POA: Diagnosis present

## 2020-10-07 DIAGNOSIS — E669 Obesity, unspecified: Secondary | ICD-10-CM | POA: Diagnosis not present

## 2020-10-07 DIAGNOSIS — R1310 Dysphagia, unspecified: Secondary | ICD-10-CM | POA: Diagnosis not present

## 2020-10-07 DIAGNOSIS — E1165 Type 2 diabetes mellitus with hyperglycemia: Secondary | ICD-10-CM | POA: Diagnosis present

## 2020-10-07 DIAGNOSIS — Z6835 Body mass index (BMI) 35.0-35.9, adult: Secondary | ICD-10-CM | POA: Diagnosis not present

## 2020-10-07 DIAGNOSIS — E876 Hypokalemia: Secondary | ICD-10-CM | POA: Diagnosis present

## 2020-10-07 DIAGNOSIS — R1312 Dysphagia, oropharyngeal phase: Secondary | ICD-10-CM | POA: Diagnosis present

## 2020-10-07 DIAGNOSIS — R531 Weakness: Secondary | ICD-10-CM

## 2020-10-07 DIAGNOSIS — E785 Hyperlipidemia, unspecified: Secondary | ICD-10-CM | POA: Diagnosis present

## 2020-10-07 DIAGNOSIS — I214 Non-ST elevation (NSTEMI) myocardial infarction: Secondary | ICD-10-CM | POA: Diagnosis present

## 2020-10-07 DIAGNOSIS — I63542 Cerebral infarction due to unspecified occlusion or stenosis of left cerebellar artery: Secondary | ICD-10-CM

## 2020-10-07 DIAGNOSIS — I10 Essential (primary) hypertension: Secondary | ICD-10-CM | POA: Diagnosis present

## 2020-10-07 DIAGNOSIS — K5900 Constipation, unspecified: Secondary | ICD-10-CM | POA: Diagnosis not present

## 2020-10-07 DIAGNOSIS — R29705 NIHSS score 5: Secondary | ICD-10-CM | POA: Diagnosis present

## 2020-10-07 DIAGNOSIS — I63442 Cerebral infarction due to embolism of left cerebellar artery: Secondary | ICD-10-CM | POA: Diagnosis present

## 2020-10-07 DIAGNOSIS — I69351 Hemiplegia and hemiparesis following cerebral infarction affecting right dominant side: Secondary | ICD-10-CM | POA: Diagnosis present

## 2020-10-07 DIAGNOSIS — H4902 Third [oculomotor] nerve palsy, left eye: Secondary | ICD-10-CM | POA: Diagnosis present

## 2020-10-07 DIAGNOSIS — K219 Gastro-esophageal reflux disease without esophagitis: Secondary | ICD-10-CM | POA: Diagnosis present

## 2020-10-07 DIAGNOSIS — I959 Hypotension, unspecified: Secondary | ICD-10-CM | POA: Diagnosis present

## 2020-10-07 DIAGNOSIS — G459 Transient cerebral ischemic attack, unspecified: Secondary | ICD-10-CM | POA: Diagnosis present

## 2020-10-07 DIAGNOSIS — I69391 Dysphagia following cerebral infarction: Secondary | ICD-10-CM | POA: Diagnosis not present

## 2020-10-07 DIAGNOSIS — I63522 Cerebral infarction due to unspecified occlusion or stenosis of left anterior cerebral artery: Secondary | ICD-10-CM

## 2020-10-07 DIAGNOSIS — G8194 Hemiplegia, unspecified affecting left nondominant side: Secondary | ICD-10-CM | POA: Diagnosis present

## 2020-10-07 DIAGNOSIS — I69390 Apraxia following cerebral infarction: Secondary | ICD-10-CM | POA: Diagnosis not present

## 2020-10-07 DIAGNOSIS — R339 Retention of urine, unspecified: Secondary | ICD-10-CM | POA: Diagnosis not present

## 2020-10-07 DIAGNOSIS — R4701 Aphasia: Secondary | ICD-10-CM | POA: Diagnosis present

## 2020-10-07 DIAGNOSIS — I69320 Aphasia following cerebral infarction: Secondary | ICD-10-CM | POA: Diagnosis not present

## 2020-10-07 DIAGNOSIS — E861 Hypovolemia: Secondary | ICD-10-CM | POA: Diagnosis not present

## 2020-10-07 DIAGNOSIS — R471 Dysarthria and anarthria: Secondary | ICD-10-CM | POA: Diagnosis present

## 2020-10-07 DIAGNOSIS — Z7902 Long term (current) use of antithrombotics/antiplatelets: Secondary | ICD-10-CM | POA: Diagnosis not present

## 2020-10-07 DIAGNOSIS — I69392 Facial weakness following cerebral infarction: Secondary | ICD-10-CM | POA: Diagnosis not present

## 2020-10-07 DIAGNOSIS — Z794 Long term (current) use of insulin: Secondary | ICD-10-CM | POA: Diagnosis not present

## 2020-10-07 DIAGNOSIS — H02402 Unspecified ptosis of left eyelid: Secondary | ICD-10-CM | POA: Diagnosis present

## 2020-10-07 DIAGNOSIS — I69321 Dysphasia following cerebral infarction: Secondary | ICD-10-CM | POA: Diagnosis not present

## 2020-10-07 DIAGNOSIS — Z20822 Contact with and (suspected) exposure to covid-19: Secondary | ICD-10-CM | POA: Diagnosis present

## 2020-10-07 DIAGNOSIS — I69398 Other sequelae of cerebral infarction: Secondary | ICD-10-CM | POA: Diagnosis not present

## 2020-10-07 DIAGNOSIS — E871 Hypo-osmolality and hyponatremia: Secondary | ICD-10-CM | POA: Diagnosis not present

## 2020-10-07 LAB — TROPONIN I (HIGH SENSITIVITY): Troponin I (High Sensitivity): 34 ng/L — ABNORMAL HIGH (ref <18)

## 2020-10-07 LAB — GLUCOSE, CAPILLARY
Glucose-Capillary: 114 mg/dL — ABNORMAL HIGH (ref 70–99)
Glucose-Capillary: 163 mg/dL — ABNORMAL HIGH (ref 70–99)
Glucose-Capillary: 174 mg/dL — ABNORMAL HIGH (ref 70–99)
Glucose-Capillary: 182 mg/dL — ABNORMAL HIGH (ref 70–99)
Glucose-Capillary: 207 mg/dL — ABNORMAL HIGH (ref 70–99)
Glucose-Capillary: 212 mg/dL — ABNORMAL HIGH (ref 70–99)

## 2020-10-07 LAB — BASIC METABOLIC PANEL
Anion gap: 4 — ABNORMAL LOW (ref 5–15)
BUN: 14 mg/dL (ref 8–23)
CO2: 27 mmol/L (ref 22–32)
Calcium: 8.6 mg/dL — ABNORMAL LOW (ref 8.9–10.3)
Chloride: 105 mmol/L (ref 98–111)
Creatinine, Ser: 1.1 mg/dL (ref 0.61–1.24)
GFR, Estimated: >60 mL/min (ref >60)
Glucose, Bld: 194 mg/dL — ABNORMAL HIGH (ref 70–99)
Potassium: 3.8 mmol/L (ref 3.5–5.1)
Sodium: 136 mmol/L (ref 135–145)

## 2020-10-07 LAB — MAGNESIUM: Magnesium: 1.7 mg/dL (ref 1.7–2.4)

## 2020-10-07 MED ORDER — AMLODIPINE BESYLATE 5 MG PO TABS
5.0000 mg | ORAL_TABLET | Freq: Every day | ORAL | Status: DC
  Administered 2020-10-07 – 2020-10-09 (×3): 5 mg via ORAL
  Filled 2020-10-07 (×3): qty 1

## 2020-10-07 NOTE — Consult Note (Addendum)
NEUROLOGY CONSULTATION NOTE   Date of service: October 07, 2020 Patient Name: Raymond Kerr MRN:  [id number] DOB:  12/23/1955 Reason for consult: acute stroke Requesting physician: Marrion Coy MD _ _ _   _ __   _ __ _ _  __ __   _ __   __ _  History of Present Illness   This is a 65 year old gentleman who has a past medical history significant for hypertension and diabetes who presented to the emergency department 2 days ago reporting multiple neurologic sx including dysarthria, diplopia, incoordination, difficulty ambulating and L facial droop. His sx began >8 hrs prior to presentation to ED and he was outside the window for tPA. MRI brain revealed small acute infarcts of the L paramedian frontal lobe and ventromedial L thalamus. MRA H&N significant for short segment occlusion of L ACA A2 and moderate narrowing of the R ACA prox A3 segment. TTE was notable for mildly decreased EF at 40-45% with regional wall motion abnormalities, otherwise unremarkable, no e/o intracardiac clot. He was not on an antiplatelet prior to admission but has been started on ASA 81mg  daily + plavix 75mg  daily. He was started on atorvastatin 80mg  daily for LDL 124. A1c is 10.1.  CNS imaging personally reviewed.   ROS   Per HPI; all other systems reviewed and are negative  Past History   Past Medical History:  Diagnosis Date   Diabetes mellitus without complication (HCC)    GERD (gastroesophageal reflux disease)    Hypertension    History reviewed. No pertinent surgical history. Family History  Problem Relation Age of Onset   Prostate cancer Neg Hx    Bladder Cancer Neg Hx    Kidney cancer Neg Hx    Social History   Socioeconomic History   Marital status: Divorced    Spouse name: Not on file   Number of children: Not on file   Years of education: Not on file   Highest education level: Not on file  Occupational History   Not on file  Tobacco Use   Smoking status: Never   Smokeless tobacco: Never   Substance and Sexual Activity   Alcohol use: Yes   Drug use: No   Sexual activity: Not on file  Other Topics Concern   Not on file  Social History Narrative   Not on file   Social Determinants of Health   Financial Resource Strain: Not on file  Food Insecurity: Not on file  Transportation Needs: Not on file  Physical Activity: Not on file  Stress: Not on file  Social Connections: Not on file   No Known Allergies  Medications   Medications Prior to Admission  Medication Sig Dispense Refill Last Dose   glimepiride (AMARYL) 4 MG tablet Take 4 mg by mouth daily with breakfast.   Past Week   losartan (COZAAR) 100 MG tablet Take 100 mg by mouth daily.   Past Week   pioglitazone (ACTOS) 30 MG tablet Take 30 mg by mouth daily.   Past Week     Vitals   Vitals:   10/07/20 0008 10/07/20 0454 10/07/20 0803 10/07/20 1200  BP: (!) 127/58 (!) 149/75 (!) 173/86 120/66  Pulse: 79 70 75 75  Resp: 18 19 14 16   Temp: 98.1 F (36.7 C) 97.6 F (36.4 C) 97.8 F (36.6 C) 97.6 F (36.4 C)  TempSrc: Oral     SpO2: 98% 100% 100% 100%  Weight:      Height:  Body mass index is 36 kg/m.  Physical Exam   Physical Exam Gen: A&O x4, NAD HEENT: Atraumatic, normocephalic;mucous membranes moist; oropharynx clear, tongue without atrophy or fasciculations. Neck: Supple, trachea midline. Resp: CTAB, no w/r/r CV: RRR, no m/g/r; nml S1 and S2. 2+ symmetric peripheral pulses. Abd: soft/NT/ND; nabs x 4 quad Extrem: Nml bulk; no cyanosis, clubbing, or edema.  Neuro: *MS: A&O x4. Follows multi-step commands.  *Speech: fluid, mild dysarthria, minimal WFD, able to name most objects, repetition is intact.  *CN:    I: Deferred   II,III: PERRLA, VFF by confrontation, optic discs not visualized 2/2 pupillary constriction   III,IV,VI: EOMI w/o nystagmus, severe ptosis on L   V: Sensation intact from V1 to V3 to LT   VII: Eyelid closure was full.  R UMN facial droop.   VIII: Hearing intact  to voice   IX,X: Voice normal, palate elevates symmetrically    XI: SCM/trap 5/5 bilat   XII: Tongue protrudes midline, no atrophy or fasciculations  *Motor:   Normal bulk.  No tremor, rigidity or bradykinesia. Pronator drift RUE.     Strength: Dlt Bic Tri WrE WrF FgS Gr HF KnF KnE PlF DoF    Left 5 5 5 5 5 5 5 5 5 5 5 5     Right 4+ 5 4 5 5 5 5  4+ 4+ 5 5 4+    *Sensory: Intact to light touch, pinprick, temperature vibration throughout. Symmetric. Propioception intact bilat.  No double-simultaneous extinction.  *Coordination:  FNF intact bilat *Reflexes:  2+ and symmetric throughout without clonus; toes down-going bilat *Gait: deferred  NIHSS  1a Level of Conscious.: 0 1b LOC Questions: 0 1c LOC Commands: 0 2 Best Gaze: 1 3 Visual: 0 4 Facial Palsy: 1 5a Motor Arm - left: 0 5b Motor Arm - Right: 1 6a Motor Leg - Left: 0 6b Motor Leg - Right: 1 7 Limb Ataxia: 0 8 Sensory: 0 9 Best Language: 1 10 Dysarthria: 1 11 Extinct. and Inatten.: 0  TOTAL: 6   Premorbid mRS = 0   Labs   CBC:  Recent Labs  Lab 10/05/20 2002 10/06/20 0818  WBC 5.3 6.1  NEUTROABS 2.9  --   HGB 13.2 10.4*  HCT 40.5 30.9*  MCV 89.4 90.6  PLT 277 265    Basic Metabolic Panel:  Lab Results  Component Value Date   NA 136 10/07/2020   K 3.8 10/07/2020   CO2 27 10/07/2020   GLUCOSE 194 (H) 10/07/2020   BUN 14 10/07/2020   CREATININE 1.10 10/07/2020   CALCIUM 8.6 (L) 10/07/2020   GFRNONAA >60 10/07/2020   Lipid Panel:  Lab Results  Component Value Date   LDLCALC 124 (H) 10/06/2020   HgbA1c:  Lab Results  Component Value Date   HGBA1C 10.1 (H) 10/06/2020   Urine Drug Screen:     Component Value Date/Time   LABOPIA NONE DETECTED 10/06/2020 1158   COCAINSCRNUR NONE DETECTED 10/06/2020 1158   LABBENZ NONE DETECTED 10/06/2020 1158   AMPHETMU NONE DETECTED 10/06/2020 1158   THCU NONE DETECTED 10/06/2020 1158   LABBARB NONE DETECTED 10/06/2020 1158    Alcohol Level      Component Value Date/Time   ETH <10 10/06/2020 0123     Impression   This is a 65 yo gentleman with hx HTN, DM2, HL who presented with multiple neurologic sx and was found to have 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 On exam he  has a L cranial nerve 3 palsy with L ptosis and inability to adduct, R UMN facial droop, mild weakness RUE and RLE, dysarthria, and minimal aphasia.   Recommendations   - No indication for permissive HTN >48 hrs out from sx onset. Goal normotension, appreciate cardiology assistance with this. Avoid hypotension. - Continue ASA 81mg  daily + plavix 75mg  daily x90 days given severe intracranial stenosis f/b ASA 81mg  daily after that - Atorvastatin 80mg  daily - Given suspected embolic source for infarct, cardiology will perform TEE tmrw further r/o intracardiac clot - STAT head CT for any change in neuro exam - Tele - PT/OT/SLP - Stroke education - Amb referral to neurology upon discharge. He will need a Zio patch in clinic for ambulatory cardiac monitoring given infarcts in multiple vascular distributions that could suggest an embolic source.  I will check back in with patient and his wife in AM to see if they have any further questions. I spent 40 minutes with them at bedside discussing dx, risk factors, tx plan, and showing them MRI images of the stroke. ______________________________________________________________________   Thank you for the opportunity to take part in the care of this patient. If you have any further questions, please contact the neurology consultation attending.  Signed,  , MD [facility] [phone]  If 7pm- 7am, please page neurology on call as listed in [facility]ION.

## 2020-10-07 NOTE — Progress Notes (Signed)
Inpatient Rehabilitation Admissions Coordinator    Notified that patient now inpt, not observation. I will place rehab consult order per protocol. An Admissions Coordinator will follow up for full assessment of rehab venue options.  Ottie Glazier, RN, MSN Rehab Admissions Coordinator [phone] 10/07/2020 1:14 PM

## 2020-10-07 NOTE — Progress Notes (Signed)
[facility] Cardiology [facility] Encounter Note  Patient: Raymond Kerr / Admit Date: 10/06/2020 / Date of Encounter: 10/07/2020, 9:07 AM   Subjective: Patient stated continued significant elevated blood pressure despite additional medication management.  We will need to adjust medication management slowly for significant hypertension.  Additionally the patient has not had any telemetry changes for concerns of atrial fibrillation.  He has had some slight improvements from the neurologic standpoint but still has left-sided facial weakness  Echocardiogram with mild segmental LV systolic dysfunction in the septal region with correlating with EKG and ejection fraction of 45%.  EKG shows septal infarct age undetermined.  No evidence of significant valvular heart disease  Review of Systems: Positive for: Facial drooping Negative for: Vision change, hearing change, syncope, dizziness, nausea, vomiting,diarrhea, bloody stool, stomach pain, cough, congestion, diaphoresis, urinary frequency, urinary pain,skin lesions, skin rashes Others previously listed  Objective: Telemetry: Normal sinus rhythm Physical Exam: Blood pressure (!) 173/86, pulse 75, temperature 97.8 F (36.6 C), resp. rate 14, height 6' (1.829 m), weight 120.4 kg, SpO2 100 %. Body mass index is 36 kg/m. General: Well developed, well nourished, in no acute distress. Head: Normocephalic, atraumatic, sclera non-icteric, no xanthomas, nares are without discharge. Neck: No apparent masses Lungs: Normal respirations with no wheezes, no rhonchi, no rales , no crackles   Heart: Regular rate and rhythm, normal S1 S2, no murmur, no rub, no gallop, PMI is normal size and placement, carotid upstroke normal without bruit, jugular venous pressure normal Abdomen: Soft, non-tender, non-distended with normoactive bowel sounds. No hepatosplenomegaly. Abdominal aorta is normal size without bruit Extremities: No edema, no clubbing, no cyanosis, no  ulcers,  Peripheral: 2+ radial, 2+ femoral, 2+ dorsal pedal pulses Neuro: Alert and oriented. Moves all extremities spontaneously.  With neurologic abnormalities Psych:  Responds to questions appropriately with a normal affect.   Intake/Output Summary (Last 24 hours) at 10/07/2020 0907 Last data filed at 10/07/2020 8242 Gross per 24 hour  Intake --  Output 1150 ml  Net -1150 ml    Inpatient Medications:    stroke: mapping our early stages of recovery book   Does not apply Once   aspirin EC  81 mg Oral Daily   atorvastatin  80 mg Oral Daily   clopidogrel  75 mg Oral Daily   enoxaparin (LOVENOX) injection  0.5 mg/kg Subcutaneous Q24H   glimepiride  4 mg Oral Q breakfast   insulin aspart  0-15 Units Subcutaneous Q4H   losartan  100 mg Oral Daily   pioglitazone  30 mg Oral Daily   Infusions:   Labs: Recent Labs    10/06/20 0818 10/07/20 0444  NA 136 136  K 3.2* 3.8  CL 101 105  CO2 26 27  GLUCOSE 167* 194*  BUN 13 14  CREATININE 1.09 1.10  CALCIUM 8.5* 8.6*  MG  --  1.7   Recent Labs    10/05/20 2002  AST 27  ALT 14  ALKPHOS 70  BILITOT 0.6  PROT 7.4  ALBUMIN 3.3*   Recent Labs    10/05/20 2002 10/06/20 0818  WBC 5.3 6.1  NEUTROABS 2.9  --   HGB 13.2 10.4*  HCT 40.5 30.9*  MCV 89.4 90.6  PLT 277 265   No results for input(s): CKTOTAL, CKMB, TROPONINI in the last 72 hours. Invalid input(s): POCBNP Recent Labs    10/06/20 0818  HGBA1C 10.1*     Weights: Filed Weights   10/05/20 1950 10/06/20 2045  Weight: 118.8 kg 120.4 kg  Radiology/Studies:  DG Chest 2 View  Result Date: 10/06/2020 CLINICAL DATA:  Slurred speech and weakness EXAM: CHEST - 2 VIEW COMPARISON:  None. FINDINGS: The heart size and mediastinal contours are within normal limits. Both lungs are clear. The visualized skeletal structures are unremarkable. IMPRESSION: No active cardiopulmonary disease. Electronically Signed   By: Alcide CleverMark  Lukens M.D.   On: 10/06/2020 01:57   CT HEAD  WO CONTRAST  Result Date: 10/05/2020 CLINICAL DATA:  Neuro deficit, acute, stroke suspected. Slurred speech and double vision for the past 8 hours. EXAM: CT HEAD WITHOUT CONTRAST TECHNIQUE: Contiguous axial images were obtained from the base of the skull through the vertex without intravenous contrast. COMPARISON:  None. FINDINGS: Brain: Chronic small vessel ischemic changes within the bilateral periventricular white matter regions. Small chronic-appearing infarct within the LEFT cerebellum. Additional low-density areas within the LEFT thalamus and RIGHT frontal lobe, compatible with subacute to chronic infarcts, favor chronic. No parenchymal mass or hemorrhage. No mass effect, midline shift or herniation seen. Vascular: Chronic calcified atherosclerotic changes of the large vessels at the skull base. No unexpected hyperdense vessel. Skull: Normal. Negative for fracture or focal lesion. Sinuses/Orbits: No acute finding. Other: None. IMPRESSION: 1. Low-density areas within the LEFT thalamus and RIGHT frontal lobe, compatible with subacute to chronic infarcts, favor chronic. 2. Additional chronic ischemic changes within the white matter regions and LEFT cerebellum. 3. No intracranial hemorrhage. No significant mass effect, midline shift or herniation. Electronically Signed   By: Bary RichardStan  Maynard M.D.   On: 10/05/2020 20:21   MR ANGIO HEAD WO CONTRAST  Result Date: 10/06/2020 CLINICAL DATA:  Seizure, slurred speech and double vision. EXAM: MRI HEAD WITHOUT CONTRAST MRA HEAD WITHOUT CONTRAST MRA NECK WITHOUT CONTRAST TECHNIQUE: Multiplanar, multiecho pulse sequences of the brain and surrounding structures were obtained without intravenous contrast. Angiographic images of the Circle of Willis were obtained using MRA technique without intravenous contrast. Angiographic images of the neck were obtained using MRA technique without intravenous contrast. Carotid stenosis measurements (when applicable) are obtained  utilizing NASCET criteria, using the distal internal carotid diameter as the denominator. COMPARISON:  None. FINDINGS: MRI HEAD FINDINGS Brain: Small acute infarcts of the left paramedian frontal lobe and ventral medial left thalamus. Multiple smaller foci of acute ischemia within the left anterior cerebral artery distribution. No acute hemorrhage. No chronic microhemorrhage. 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 There is multifocal hyperintense T2-weighted signal within the white matter. Parenchymal volume and CSF spaces are normal. The midline structures are normal. Vascular: Major flow voids are preserved. Skull and upper cervical spine: Normal calvarium and skull base. Visualized upper cervical spine and soft tissues are normal. Sinuses/Orbits:No paranasal sinus fluid levels or advanced mucosal thickening. No mastoid or middle ear effusion. Normal orbits. MRA HEAD FINDINGS POSTERIOR CIRCULATION: --Vertebral arteries: Normal --Inferior cerebellar arteries: Normal. --Basilar artery: Normal. --Superior cerebellar arteries: Normal. --Posterior cerebral arteries: Normal. ANTERIOR CIRCULATION: --Intracranial internal carotid arteries: Normal. --Anterior cerebral arteries (ACA): There is occlusion of a short segment of the left ACA A2 segment. There is moderate narrowing of the right ACA proximal pericallosal portion. --Middle cerebral arteries (MCA): Normal. ANATOMIC VARIANTS: None MRA NECK FINDINGS Motion degraded time-of-flight imaging of the carotid and vertebral arteries is unremarkable. There is no visible stenosis or occlusion. IMPRESSION: 1. Small acute infarcts of the left paramedian frontal lobe and ventromedial left thalamus. No hemorrhage or mass effect. 2. 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 3. Moderate narrowing of the right ACA proximal A3 segment.  4. Motion degraded time-of-flight MRA of the neck without visible abnormality. Electronically Signed   By: Deatra Robinson M.D.   On:  10/06/2020 03:37   MR ANGIO NECK WO CONTRAST  Result Date: 10/06/2020 CLINICAL DATA:  Seizure, slurred speech and double vision. EXAM: MRI HEAD WITHOUT CONTRAST MRA HEAD WITHOUT CONTRAST MRA NECK WITHOUT CONTRAST TECHNIQUE: Multiplanar, multiecho pulse sequences of the brain and surrounding structures were obtained without intravenous contrast. Angiographic images of the Circle of Willis were obtained using MRA technique without intravenous contrast. Angiographic images of the neck were obtained using MRA technique without intravenous contrast. Carotid stenosis measurements (when applicable) are obtained utilizing NASCET criteria, using the distal internal carotid diameter as the denominator. COMPARISON:  None. FINDINGS: MRI HEAD FINDINGS Brain: Small acute infarcts of the left paramedian frontal lobe and ventral medial left thalamus. Multiple smaller foci of acute ischemia within the left anterior cerebral artery distribution. No acute hemorrhage. No chronic microhemorrhage. 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 There is multifocal hyperintense T2-weighted signal within the white matter. Parenchymal volume and CSF spaces are normal. The midline structures are normal. Vascular: Major flow voids are preserved. Skull and upper cervical spine: Normal calvarium and skull base. Visualized upper cervical spine and soft tissues are normal. Sinuses/Orbits:No paranasal sinus fluid levels or advanced mucosal thickening. No mastoid or middle ear effusion. Normal orbits. MRA HEAD FINDINGS POSTERIOR CIRCULATION: --Vertebral arteries: Normal --Inferior cerebellar arteries: Normal. --Basilar artery: Normal. --Superior cerebellar arteries: Normal. --Posterior cerebral arteries: Normal. ANTERIOR CIRCULATION: --Intracranial internal carotid arteries: Normal. --Anterior cerebral arteries (ACA): There is occlusion of a short segment of the left ACA A2 segment. There is moderate narrowing of the right ACA proximal  pericallosal portion. --Middle cerebral arteries (MCA): Normal. ANATOMIC VARIANTS: None MRA NECK FINDINGS Motion degraded time-of-flight imaging of the carotid and vertebral arteries is unremarkable. There is no visible stenosis or occlusion. IMPRESSION: 1. Small acute infarcts of the left paramedian frontal lobe and ventromedial left thalamus. No hemorrhage or mass effect. 2. 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 3. Moderate narrowing of the right ACA proximal A3 segment. 4. Motion degraded time-of-flight MRA of the neck without visible abnormality. Electronically Signed   By: Deatra Robinson M.D.   On: 10/06/2020 03:37   MR BRAIN WO CONTRAST  Result Date: 10/06/2020 CLINICAL DATA:  Seizure, slurred speech and double vision. EXAM: MRI HEAD WITHOUT CONTRAST MRA HEAD WITHOUT CONTRAST MRA NECK WITHOUT CONTRAST TECHNIQUE: Multiplanar, multiecho pulse sequences of the brain and surrounding structures were obtained without intravenous contrast. Angiographic images of the Circle of Willis were obtained using MRA technique without intravenous contrast. Angiographic images of the neck were obtained using MRA technique without intravenous contrast. Carotid stenosis measurements (when applicable) are obtained utilizing NASCET criteria, using the distal internal carotid diameter as the denominator. COMPARISON:  None. FINDINGS: MRI HEAD FINDINGS Brain: Small acute infarcts of the left paramedian frontal lobe and ventral medial left thalamus. Multiple smaller foci of acute ischemia within the left anterior cerebral artery distribution. No acute hemorrhage. No chronic microhemorrhage. 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 There is multifocal hyperintense T2-weighted signal within the white matter. Parenchymal volume and CSF spaces are normal. The midline structures are normal. Vascular: Major flow voids are preserved. Skull and upper cervical spine: Normal calvarium and skull base. Visualized upper  cervical spine and soft tissues are normal. Sinuses/Orbits:No paranasal sinus fluid levels or advanced mucosal thickening. No mastoid or middle ear effusion. Normal orbits. MRA HEAD FINDINGS POSTERIOR  CIRCULATION: --Vertebral arteries: Normal --Inferior cerebellar arteries: Normal. --Basilar artery: Normal. --Superior cerebellar arteries: Normal. --Posterior cerebral arteries: Normal. ANTERIOR CIRCULATION: --Intracranial internal carotid arteries: Normal. --Anterior cerebral arteries (ACA): There is occlusion of a short segment of the left ACA A2 segment. There is moderate narrowing of the right ACA proximal pericallosal portion. --Middle cerebral arteries (MCA): Normal. ANATOMIC VARIANTS: None MRA NECK FINDINGS Motion degraded time-of-flight imaging of the carotid and vertebral arteries is unremarkable. There is no visible stenosis or occlusion. IMPRESSION: 1. Small acute infarcts of the left paramedian frontal lobe and ventromedial left thalamus. No hemorrhage or mass effect. 2. 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 3. Moderate narrowing of the right ACA proximal A3 segment. 4. Motion degraded time-of-flight MRA of the neck without visible abnormality. Electronically Signed   By: Deatra Robinson M.D.   On: 10/06/2020 03:37   ECHOCARDIOGRAM COMPLETE  Result Date: 10/06/2020    ECHOCARDIOGRAM REPORT   Patient Name:   Raymond Kerr Date of Exam: 10/06/2020 Medical Rec #:  [id number]     Height:       72.0 in Accession #:    [id number]    Weight:       262.0 lb Date of Birth:  December 04, 1955    BSA:          2.390 m Patient Age:    64 years      BP:           119/78 mmHg Patient Gender: M             HR:           71 bpm. Exam Location:  [facility] Procedure: 2D Echo, Color Doppler and Cardiac Doppler Indications:     TIA G45.9  History:         Patient has no prior history of Echocardiogram examinations.                  Risk Factors:Diabetes and Hypertension. GERD.  Sonographer:     Cristela Blue Referring Phys:   [id number] JAN A MANSY Diagnosing Phys: Arnoldo Hooker MD  Sonographer Comments: Technically challenging study due to limited acoustic windows. The only view obtainable was parasternal. IMPRESSIONS  1. Left ventricular ejection fraction, by estimation, is 40 to 45%. The left ventricle has mildly decreased function. The left ventricle demonstrates regional wall motion abnormalities (see scoring diagram/findings for description). Left ventricular diastolic parameters were normal.  2. Right ventricular systolic function is normal. The right ventricular size is normal.  3. The mitral valve is normal in structure. Mild mitral valve regurgitation.  4. The aortic valve is normal in structure. Aortic valve regurgitation is not visualized. FINDINGS  Left Ventricle: Left ventricular ejection fraction, by estimation, is 40 to 45%. The left ventricle has mildly decreased function. The left ventricle demonstrates regional wall motion abnormalities. Moderate hypokinesis of the left ventricular, mid-apical anteroseptal wall. The left ventricular internal cavity size was normal in size. There is no left ventricular hypertrophy. Left ventricular diastolic parameters were normal. Right Ventricle: The right ventricular size is normal. No increase in right ventricular wall thickness. Right ventricular systolic function is normal. Left Atrium: Left atrial size was normal in size. Right Atrium: Right atrial size was normal in size. Pericardium: There is no evidence of pericardial effusion. Mitral Valve: The mitral valve is normal in structure. Mild mitral valve regurgitation. Tricuspid Valve: The tricuspid valve is normal in structure. Tricuspid valve regurgitation is mild. Aortic Valve: The aortic valve  is normal in structure. Aortic valve regurgitation is not visualized. Pulmonic Valve: The pulmonic valve was normal in structure. Pulmonic valve regurgitation is not visualized. Aorta: The aortic root and ascending aorta are structurally  normal, with no evidence of dilitation. IAS/Shunts: The interatrial septum was not assessed.  LEFT VENTRICLE PLAX 2D LVIDd:         4.30 cm LVIDs:         3.40 cm LV PW:         1.10 cm LV IVS:        1.20 cm LVOT diam:     2.00 cm LVOT Area:     3.14 cm  LEFT ATRIUM         Index LA diam:    2.90 cm 1.21 cm/m   AORTA Ao Root diam: 3.60 cm  SHUNTS Systemic Diam: 2.00 cm Arnoldo Hooker MD Electronically signed by Arnoldo Hooker MD Signature Date/Time: 10/06/2020/12:44:47 PM    Final      Assessment and Recommendation  65 y.o. male with known significant diabetes hypertension hyperlipidemia abnormal EKG with acute stroke most consistent with ischemic and/or vascular atherosclerotic stroke rather than embolic without evidence of congestive heart failure or myocardial infarction at this time 1.  Dual antiplatelet therapy including Plavix and aspirin for acute stroke 2.  High intensity cholesterol therapy 3.  Hypertension control with current angiotensin receptor blocker and will consider introduction of beta-blocker and/or calcium channel blocker for goal systolic blood pressure below 841 mm 4.  No further cardiac diagnostics at this time due to no evidence of heart failure or myocardial infarction despite mild LV dysfunction 5.  Begin physical rehabilitation  Signed, Arnoldo Hooker M.D. FACC

## 2020-10-07 NOTE — Progress Notes (Signed)
PROGRESS NOTE    Raymond Kerr  UJW:[id number] DOB: 1956/02/12 DOA: 10/06/2020 PCP: Barbette Reichmann, MD    Brief Narrative:  Raymond Kerr is a 65 y.o. African-American male with medical history significant for type II diabetes mellitus, GERD and hypertension, who presented to the emergency room with acute onset of diplopia and generalized weakness that started 8 hours prior to presentation.  The patient has been having expressive dysphasia. On arrival to the emergency room here it appears that the patient has left-sided weakness and difficulty with speech.  MRI of the brain showed small acute infarcts of the left paramedian frontal lobe and ventromedial left thalamus. Neurology consult has been obtained. MRA head showed a short segment of the left ACA A2 segment.   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  Acute left ACA ischemic stroke. Patient will be seen by neurology. Continue Lipitor, Plavix and aspirin. Patient still has significant weakness, has been seen by PT/OT.  Recommended CIR placement.  2.  Type 2 diabetes. Continue current regimen   3.  Hypokalemia. Improved.   DVT prophylaxis: Lovenox Code Status: full Family Communication: wife updated Disposition Plan:    Status is: Inpatient  Remains inpatient appropriate because:Inpatient level of care appropriate due to severity of illness  Dispo: The patient is from: Home              Anticipated d/c is to:  CIR              Patient currently is not medically stable to d/c.   Difficult to place patient No        I/O last 3 completed shifts: In: -  Out: 850 [Urine:850] Total I/O In: 240 [P.O.:240] Out: 300 [Urine:300]     Consultants:  Neurology  Procedures: None  Antimicrobials: None   Subjective: Patient still has significant weakness, difficult to  walk. No longer has any speech disturbance. Denies any short of breath or cough. No abdominal pain or nausea vomiting.  Objective: Vitals:   10/06/20 2045 10/07/20 0008 10/07/20 0454 10/07/20 0803  BP: (!) 148/76 (!) 127/58 (!) 149/75 (!) 173/86  Pulse: 88 79 70 75  Resp: Temp: 98.6 F (37 C) 98.1 F (36.7 C) 97.6 F (36.4 C) 97.8 F (36.6 C)  TempSrc: Oral Oral    SpO2: 98% 98% 100% 100%  Weight: 120.4 kg     Height: 6' (1.829 m)       Intake/Output Summary (Last 24 hours) at 10/07/2020 1155 Last data filed at 10/07/2020 1018 Gross per 24 hour  Intake 240 ml  Output 1150 ml  Net -910 ml   Filed Weights   10/05/20 1950 10/06/20 2045  Weight: 118.8 kg 120.4 kg    Examination:  General exam: Appears calm and comfortable  Respiratory system: Clear to auscultation. Respiratory effort normal. Cardiovascular system: S1 & S2 heard, RRR. No JVD, murmurs, rubs, gallops or clicks. No pedal edema. Gastrointestinal system: Abdomen is nondistended, soft and nontender. No organomegaly or masses felt. Normal bowel sounds heard. Central nervous system: Alert and oriented. No focal neurological deficits. Extremities: Symmetric 5 x 5 power. Skin: No rashes, lesions or ulcers Psychiatry: Judgement and insight appear normal. Mood & affect appropriate.     Data Reviewed: I have personally reviewed following labs and imaging studies  CBC:  Recent Labs  Lab 10/05/20 2002 10/06/20 0818  WBC 5.3 6.1  NEUTROABS 2.9  --   HGB 13.2 10.4*  HCT 40.5 30.9*  MCV 89.4 90.6  PLT 277 265   Basic Metabolic Panel: Recent Labs  Lab 10/05/20 2002 10/06/20 0818 10/07/20 0444  NA 135 136 136  K 3.7 3.2* 3.8  CL 100 101 105  CO2 27 26 27   GLUCOSE 260* 167* 194*  BUN 14 13 14   CREATININE 1.07 1.09 1.10  CALCIUM 9.1 8.5* 8.6*  MG  --   --  1.7   GFR: Estimated Creatinine Clearance: 89.7 mL/min (by C-G formula based on SCr of 1.1 mg/dL). Liver Function Tests: Recent Labs   Lab 10/05/20 2002  AST 27  ALT 14  ALKPHOS 70  BILITOT 0.6  PROT 7.4  ALBUMIN 3.3*   No results for input(s): LIPASE, AMYLASE in the last 168 hours. No results for input(s): AMMONIA in the last 168 hours. Coagulation Profile: Recent Labs  Lab 10/05/20 2002  INR 1.0   Cardiac Enzymes: No results for input(s): CKTOTAL, CKMB, CKMBINDEX, TROPONINI in the last 168 hours. BNP (last 3 results) No results for input(s): PROBNP in the last 8760 hours. HbA1C: Recent Labs    10/05/20 2002 10/06/20 0818  HGBA1C 10.3* 10.1*   CBG: Recent Labs  Lab 10/06/20 1917 10/06/20 2113 10/07/20 0009 10/07/20 0412 10/07/20 0831  GLUCAP 187* 210* 207* 163* 114*   Lipid Profile: Recent Labs    10/05/20 2002 10/06/20 0818  CHOL 199 174  HDL 35* 30*  LDLCALC 136* 10/07/20*  TRIG 138 102  CHOLHDL 5.7 5.8   Thyroid Function Tests: No results for input(s): TSH, T4TOTAL, FREET4, T3FREE, THYROIDAB in the last 72 hours. Anemia Panel: No results for input(s): VITAMINB12, FOLATE, FERRITIN, TIBC, IRON, RETICCTPCT in the last 72 hours. Sepsis Labs: No results for input(s): PROCALCITON, LATICACIDVEN in the last 168 hours.  Recent Results (from the past 240 hour(s))  Resp Panel by RT-PCR (Flu A&B, Covid) Nasopharyngeal Swab     Status: None   Collection Time: 10/06/20  1:23 AM   Specimen: Nasopharyngeal Swab; Nasopharyngeal(NP) swabs in vial transport medium  Result Value Ref Range Status   SARS Coronavirus 2 by RT PCR NEGATIVE NEGATIVE Final    Comment: (NOTE) SARS-CoV-2 target nucleic acids are NOT DETECTED.  The SARS-CoV-2 RNA is generally detectable in upper respiratory specimens during the acute phase of infection. The lowest concentration of SARS-CoV-2 viral copies this assay can detect is 138 copies/mL. A negative result does not preclude SARS-Cov-2 infection and should not be used as the sole basis for treatment or other patient management decisions. A negative result may occur with   improper specimen collection/handling, submission of specimen other than nasopharyngeal swab, presence of viral mutation(s) within the areas targeted by this assay, and inadequate number of viral copies(<138 copies/mL). A negative result must be combined with clinical observations, patient history, and epidemiological information. The expected result is Negative.  Fact Sheet for Patients:  474  Fact Sheet for Healthcare Providers:  10/08/20  This test is no t yet approved or cleared by the [employer] FDA and  has been authorized for detection and/or diagnosis of SARS-CoV-2 by FDA under an Emergency Use Authorization (EUA). This EUA will remain  in effect (meaning this test can be used) for the duration of the COVID-19 declaration under Section 564(b)(1) of the Act, 21 U.S.C.section 360bbb-3(b)(1), unless the authorization is terminated  or revoked sooner.  Influenza A by PCR NEGATIVE NEGATIVE Final   Influenza B by PCR NEGATIVE NEGATIVE Final    Comment: (NOTE) The Xpert Xpress SARS-CoV-2/FLU/RSV plus assay is intended as an aid in the diagnosis of influenza from Nasopharyngeal swab specimens and should not be used as a sole basis for treatment. Nasal washings and aspirates are unacceptable for Xpert Xpress SARS-CoV-2/FLU/RSV testing.  Fact Sheet for Patients: [employer]  Fact Sheet for Healthcare Providers: [employer]it  This test is not yet approved or cleared by the [place] FDA and has been authorized for detection and/or diagnosis of SARS-CoV-2 by FDA under an Emergency Use Authorization (EUA). This EUA will remain in effect (meaning this test can be used) for the duration of the COVID-19 declaration under Section 564(b)(1) of the Act, 21 U.S.C. section 360bbb-3(b)(1), unless the authorization is terminated  or revoked.  Performed at [facility], [address], [place], [place] [address]          Radiology Studies: DG Chest 2 View  Result Date: 10/06/2020 CLINICAL DATA:  Slurred speech and weakness EXAM: CHEST - 2 VIEW COMPARISON:  None. FINDINGS: The heart size and mediastinal contours are within normal limits. Both lungs are clear. The visualized skeletal structures are unremarkable. IMPRESSION: No active cardiopulmonary disease. Electronically Signed   By: Alcide Clever M.D.   On: 10/06/2020 01:57   CT HEAD WO CONTRAST  Result Date: 10/05/2020 CLINICAL DATA:  Neuro deficit, acute, stroke suspected. Slurred speech and double vision for the past 8 hours. EXAM: CT HEAD WITHOUT CONTRAST TECHNIQUE: Contiguous axial images were obtained from the base of the skull through the vertex without intravenous contrast. COMPARISON:  None. FINDINGS: Brain: Chronic small vessel ischemic changes within the bilateral periventricular white matter regions. Small chronic-appearing infarct within the LEFT cerebellum. Additional low-density areas within the LEFT thalamus and RIGHT frontal lobe, compatible with subacute to chronic infarcts, favor chronic. No parenchymal mass or hemorrhage. No mass effect, midline shift or herniation seen. Vascular: Chronic calcified atherosclerotic changes of the large vessels at the skull base. No unexpected hyperdense vessel. Skull: Normal. Negative for fracture or focal lesion. Sinuses/Orbits: No acute finding. Other: None. IMPRESSION: 1. Low-density areas within the LEFT thalamus and RIGHT frontal lobe, compatible with subacute to chronic infarcts, favor chronic. 2. Additional chronic ischemic changes within the white matter regions and LEFT cerebellum. 3. No intracranial hemorrhage. No significant mass effect, midline shift or herniation. Electronically Signed   By: Bary Richard M.D.   On: 10/05/2020 20:21   MR ANGIO HEAD WO CONTRAST  Result Date: 10/06/2020 CLINICAL  DATA:  Seizure, slurred speech and double vision. EXAM: MRI HEAD WITHOUT CONTRAST MRA HEAD WITHOUT CONTRAST MRA NECK WITHOUT CONTRAST TECHNIQUE: Multiplanar, multiecho pulse sequences of the brain and surrounding structures were obtained without intravenous contrast. Angiographic images of the Circle of Willis were obtained using MRA technique without intravenous contrast. Angiographic images of the neck were obtained using MRA technique without intravenous contrast. Carotid stenosis measurements (when applicable) are obtained utilizing NASCET criteria, using the distal internal carotid diameter as the denominator. COMPARISON:  None. FINDINGS: MRI HEAD FINDINGS Brain: Small acute infarcts of the left paramedian frontal lobe and ventral medial left thalamus. Multiple smaller foci of acute ischemia within the left anterior cerebral artery distribution. No acute hemorrhage. No chronic microhemorrhage. 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 There is multifocal hyperintense T2-weighted signal within the white matter. Parenchymal volume and CSF spaces are normal. The midline structures are normal. Vascular: Major flow voids  are preserved. Skull and upper cervical spine: Normal calvarium and skull base. Visualized upper cervical spine and soft tissues are normal. Sinuses/Orbits:No paranasal sinus fluid levels or advanced mucosal thickening. No mastoid or middle ear effusion. Normal orbits. MRA HEAD FINDINGS POSTERIOR CIRCULATION: --Vertebral arteries: Normal --Inferior cerebellar arteries: Normal. --Basilar artery: Normal. --Superior cerebellar arteries: Normal. --Posterior cerebral arteries: Normal. ANTERIOR CIRCULATION: --Intracranial internal carotid arteries: Normal. --Anterior cerebral arteries (ACA): There is occlusion of a short segment of the left ACA A2 segment. There is moderate narrowing of the right ACA proximal pericallosal portion. --Middle cerebral arteries (MCA): Normal. ANATOMIC VARIANTS: None MRA  NECK FINDINGS Motion degraded time-of-flight imaging of the carotid and vertebral arteries is unremarkable. There is no visible stenosis or occlusion. IMPRESSION: 1. Small acute infarcts of the left paramedian frontal lobe and ventromedial left thalamus. No hemorrhage or mass effect. 2. 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 3. Moderate narrowing of the right ACA proximal A3 segment. 4. Motion degraded time-of-flight MRA of the neck without visible abnormality. Electronically Signed   By: Deatra Robinson M.D.   On: 10/06/2020 03:37   MR ANGIO NECK WO CONTRAST  Result Date: 10/06/2020 CLINICAL DATA:  Seizure, slurred speech and double vision. EXAM: MRI HEAD WITHOUT CONTRAST MRA HEAD WITHOUT CONTRAST MRA NECK WITHOUT CONTRAST TECHNIQUE: Multiplanar, multiecho pulse sequences of the brain and surrounding structures were obtained without intravenous contrast. Angiographic images of the Circle of Willis were obtained using MRA technique without intravenous contrast. Angiographic images of the neck were obtained using MRA technique without intravenous contrast. Carotid stenosis measurements (when applicable) are obtained utilizing NASCET criteria, using the distal internal carotid diameter as the denominator. COMPARISON:  None. FINDINGS: MRI HEAD FINDINGS Brain: Small acute infarcts of the left paramedian frontal lobe and ventral medial left thalamus. Multiple smaller foci of acute ischemia within the left anterior cerebral artery distribution. No acute hemorrhage. No chronic microhemorrhage. 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 There is multifocal hyperintense T2-weighted signal within the white matter. Parenchymal volume and CSF spaces are normal. The midline structures are normal. Vascular: Major flow voids are preserved. Skull and upper cervical spine: Normal calvarium and skull base. Visualized upper cervical spine and soft tissues are normal. Sinuses/Orbits:No paranasal sinus fluid levels  or advanced mucosal thickening. No mastoid or middle ear effusion. Normal orbits. MRA HEAD FINDINGS POSTERIOR CIRCULATION: --Vertebral arteries: Normal --Inferior cerebellar arteries: Normal. --Basilar artery: Normal. --Superior cerebellar arteries: Normal. --Posterior cerebral arteries: Normal. ANTERIOR CIRCULATION: --Intracranial internal carotid arteries: Normal. --Anterior cerebral arteries (ACA): There is occlusion of a short segment of the left ACA A2 segment. There is moderate narrowing of the right ACA proximal pericallosal portion. --Middle cerebral arteries (MCA): Normal. ANATOMIC VARIANTS: None MRA NECK FINDINGS Motion degraded time-of-flight imaging of the carotid and vertebral arteries is unremarkable. There is no visible stenosis or occlusion. IMPRESSION: 1. Small acute infarcts of the left paramedian frontal lobe and ventromedial left thalamus. No hemorrhage or mass effect. 2. 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 3. Moderate narrowing of the right ACA proximal A3 segment. 4. Motion degraded time-of-flight MRA of the neck without visible abnormality. Electronically Signed   By: Deatra Robinson M.D.   On: 10/06/2020 03:37   MR BRAIN WO CONTRAST  Result Date: 10/06/2020 CLINICAL DATA:  Seizure, slurred speech and double vision. EXAM: MRI HEAD WITHOUT CONTRAST MRA HEAD WITHOUT CONTRAST MRA NECK WITHOUT CONTRAST TECHNIQUE: Multiplanar, multiecho pulse sequences of the brain and surrounding structures were obtained without  intravenous contrast. Angiographic images of the Circle of Willis were obtained using MRA technique without intravenous contrast. Angiographic images of the neck were obtained using MRA technique without intravenous contrast. Carotid stenosis measurements (when applicable) are obtained utilizing NASCET criteria, using the distal internal carotid diameter as the denominator. COMPARISON:  None. FINDINGS: MRI HEAD FINDINGS Brain: Small acute infarcts of the left paramedian  frontal lobe and ventral medial left thalamus. Multiple smaller foci of acute ischemia within the left anterior cerebral artery distribution. No acute hemorrhage. No chronic microhemorrhage. 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 There is multifocal hyperintense T2-weighted signal within the white matter. Parenchymal volume and CSF spaces are normal. The midline structures are normal. Vascular: Major flow voids are preserved. Skull and upper cervical spine: Normal calvarium and skull base. Visualized upper cervical spine and soft tissues are normal. Sinuses/Orbits:No paranasal sinus fluid levels or advanced mucosal thickening. No mastoid or middle ear effusion. Normal orbits. MRA HEAD FINDINGS POSTERIOR CIRCULATION: --Vertebral arteries: Normal --Inferior cerebellar arteries: Normal. --Basilar artery: Normal. --Superior cerebellar arteries: Normal. --Posterior cerebral arteries: Normal. ANTERIOR CIRCULATION: --Intracranial internal carotid arteries: Normal. --Anterior cerebral arteries (ACA): There is occlusion of a short segment of the left ACA A2 segment. There is moderate narrowing of the right ACA proximal pericallosal portion. --Middle cerebral arteries (MCA): Normal. ANATOMIC VARIANTS: None MRA NECK FINDINGS Motion degraded time-of-flight imaging of the carotid and vertebral arteries is unremarkable. There is no visible stenosis or occlusion. IMPRESSION: 1. Small acute infarcts of the left paramedian frontal lobe and ventromedial left thalamus. No hemorrhage or mass effect. 2. 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 3. Moderate narrowing of the right ACA proximal A3 segment. 4. Motion degraded time-of-flight MRA of the neck without visible abnormality. Electronically Signed   By: Deatra Robinson M.D.   On: 10/06/2020 03:37   ECHOCARDIOGRAM COMPLETE  Result Date: 10/06/2020    ECHOCARDIOGRAM REPORT   Patient Name:   BARTOLO MONTANYE Date of Exam: 10/06/2020 Medical Rec #:  [id number]      Height:       72.0 in Accession #:    [id number]    Weight:       262.0 lb Date of Birth:  1955-03-06    BSA:          2.390 m Patient Age:    64 years      BP:           119/78 mmHg Patient Gender: M             HR:           71 bpm. Exam Location:  [facility] Procedure: 2D Echo, Color Doppler and Cardiac Doppler Indications:     TIA G45.9  History:         Patient has no prior history of Echocardiogram examinations.                  Risk Factors:Diabetes and Hypertension. GERD.  Sonographer:     Cristela Blue Referring Phys:  [id number] JAN A MANSY Diagnosing Phys: Arnoldo Hooker MD  Sonographer Comments: Technically challenging study due to limited acoustic windows. The only view obtainable was parasternal. IMPRESSIONS  1. Left ventricular ejection fraction, by estimation, is 40 to 45%. The left ventricle has mildly decreased function. The left ventricle demonstrates regional wall motion abnormalities (see scoring diagram/findings for description). Left ventricular diastolic parameters were normal.  2. Right ventricular systolic function is normal. The right ventricular size is normal.  3. The mitral valve is normal in structure. Mild mitral valve regurgitation.  4. The aortic valve is normal in structure. Aortic valve regurgitation is not visualized. FINDINGS  Left Ventricle: Left ventricular ejection fraction, by estimation, is 40 to 45%. The left ventricle has mildly decreased function. The left ventricle demonstrates regional wall motion abnormalities. Moderate hypokinesis of the left ventricular, mid-apical anteroseptal wall. The left ventricular internal cavity size was normal in size. There is no left ventricular hypertrophy. Left ventricular diastolic parameters were normal. Right Ventricle: The right ventricular size is normal. No increase in right ventricular wall thickness. Right ventricular systolic function is normal. Left Atrium: Left atrial size was normal in size. Right Atrium: Right atrial size was normal  in size. Pericardium: There is no evidence of pericardial effusion. Mitral Valve: The mitral valve is normal in structure. Mild mitral valve regurgitation. Tricuspid Valve: The tricuspid valve is normal in structure. Tricuspid valve regurgitation is mild. Aortic Valve: The aortic valve is normal in structure. Aortic valve regurgitation is not visualized. Pulmonic Valve: The pulmonic valve was normal in structure. Pulmonic valve regurgitation is not visualized. Aorta: The aortic root and ascending aorta are structurally normal, with no evidence of dilitation. IAS/Shunts: The interatrial septum was not assessed.  LEFT VENTRICLE PLAX 2D LVIDd:         4.30 cm LVIDs:         3.40 cm LV PW:         1.10 cm LV IVS:        1.20 cm LVOT diam:     2.00 cm LVOT Area:     3.14 cm  LEFT ATRIUM         Index LA diam:    2.90 cm 1.21 cm/m   AORTA Ao Root diam: 3.60 cm  SHUNTS Systemic Diam: 2.00 cm Arnoldo HookerBruce Kowalski MD Electronically signed by Arnoldo HookerBruce Kowalski MD Signature Date/Time: 10/06/2020/12:44:47 PM    Final         Scheduled Meds:   stroke: mapping our early stages of recovery book   Does not apply Once   amLODipine  5 mg Oral Daily   aspirin EC  81 mg Oral Daily   atorvastatin  80 mg Oral Daily   clopidogrel  75 mg Oral Daily   enoxaparin (LOVENOX) injection  0.5 mg/kg Subcutaneous Q24H   glimepiride  4 mg Oral Q breakfast   insulin aspart  0-15 Units Subcutaneous Q4H   losartan  100 mg Oral Daily   pioglitazone  30 mg Oral Daily   Continuous Infusions:   LOS: 0 days    Time spent: 27 minutes    Marrion Coyekui Valen Mascaro, MD [facility]   To contact the attending provider between 7A-7P or the covering provider during after hours 7P-7A, please log into the web site [email or web address] and access using universal [place] password for that web site. If you do not have the password, please call the hospital operator.  10/07/2020, 11:55 AM

## 2020-10-07 NOTE — TOC Initial Note (Addendum)
Transition of Care [facility]) - Initial/Assessment Note    Patient Details  Name: Raymond Kerr MRN: [id number] Date of Birth: 1955/08/20  Transition of Care [facility]) CM/SW Contact:    Allayne Butcher, RN Phone Number: 10/07/2020, 4:09 PM  Clinical Narrative:                 Patient admitted to the hospital with stroke.  RNCM was able to meet with patient and patient's significant other, Raymond Kerr.  Patient is quiet but awake.  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  Raymond Kerr reports that the patient has insurance and she is here to help him and will be available to help him after discharge and during his whole recovery.  She wants CIR, CIR is evaluating patient.  Patient is current with Dr. Marcello Fennel at [facility] for PCP.    TOC will cont to follow.    Expected Discharge Plan: IP Rehab Facility Barriers to Discharge: Continued Medical Work up   Patient Goals and CMS Choice Patient states their goals for this hospitalization and ongoing recovery are:: Patient's significant other hopes that patient can go to CIR CMS Medicare.gov Compare Post Acute Care list provided to:: Patient Represenative (must comment) Choice offered to / list presented to : Patient (Significant other)  Expected Discharge Plan and Services Expected Discharge Plan: IP Rehab Facility   Discharge Planning Services: CM Consult Post Acute Care Choice: IP Rehab Living arrangements for the past 2 months: No permanent address Youth worker)                 DME Arranged: N/A DME Agency: NA       HH Arranged: NA HH Agency: NA        Prior Living Arrangements/Services Living arrangements for the past 2 months: No permanent address Youth worker) Lives with:: Self Patient language and need for interpreter reviewed:: Yes Do you feel safe going back to the  place where you live?: Yes      Need for Family Participation in Patient Care: Yes (Comment) (Stroke) Care giver support system in place?: Yes (comment)   Criminal Activity/Legal Involvement Pertinent to Current Situation/Hospitalization: No - Comment as needed  Activities of Daily Living Home Assistive Devices/Equipment: None ADL Screening (condition at time of admission) Patient's cognitive ability adequate to safely complete daily activities?: Yes Is the patient deaf or have difficulty hearing?: No Does the patient have difficulty seeing, even when wearing glasses/contacts?: No Does the patient have difficulty concentrating, remembering, or making decisions?: No Patient able to express need for assistance with ADLs?: Yes Does the patient have difficulty dressing or bathing?: Yes Independently performs ADLs?: No Communication: Independent Dressing (OT): Needs assistance Is this a change from baseline?: Change from baseline, expected to last >3 days Grooming: Needs assistance Is this a change from baseline?: Change from baseline, expected to last >3 days Feeding: Independent, Needs assistance Is this a change from baseline?: Change from baseline, expected to last >3 days Bathing: Needs assistance Is this a change from baseline?: Change from baseline, expected to last >3 days Toileting: Dependent, Needs assistance Is this a change from baseline?: Change from baseline, expected to last >3days In/Out Bed: Dependent, Needs assistance Is this a change from baseline?: Change from baseline, expected to last >3 days Walks in Home:  Dependent, Needs assistance Is this a change from baseline?: Change from baseline, expected to last >3 days Does the patient have difficulty walking or climbing stairs?: Yes Weakness of Legs: Left Weakness of Arms/Hands: Left  Permission Sought/Granted Permission sought to share information with : Case Manager, Magazine features editor, Family  Supports Permission granted to share information with : Yes, Verbal Permission Granted  Share Information with NAME: [name]  Permission granted to share info w AGENCY: CIR  Permission granted to share info w Relationship: significant other     Emotional Assessment Appearance:: Appears stated age Attitude/Demeanor/Rapport: Lethargic   Orientation: : Oriented to Self, Oriented to Place, Oriented to Situation Alcohol / Substance Use: Not Applicable Psych Involvement: No (comment)  Admission diagnosis:  TIA (transient ischemic attack) [G45.9] Hyperglycemia [R73.9] NSTEMI (non-ST elevated myocardial infarction) (HCC) [I21.4] Cerebrovascular accident (CVA), unspecified mechanism (HCC) [I63.9] 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 [facility]) [I63.542] Patient Active Problem List   Diagnosis Date Noted   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

## 2020-10-07 NOTE — Progress Notes (Signed)
Inpatient Diabetes Program Recommendations  AACE/ADA: New Consensus Statement on Inpatient Glycemic Control (2015)  Target Ranges:  Prepandial:   less than 140 mg/dL      Peak postprandial:   less than 180 mg/dL (1-2 hours)      Critically ill patients:  140 - 180 mg/dL  Results for Raymond Kerr, Raymond Kerr (MRN [id number]) as of 10/07/2020 12:09  Ref. Range 10/07/2020 00:09 10/07/2020 04:12 10/07/2020 08:31 10/07/2020 12:02  Glucose-Capillary Latest Ref Range: 70 - 99 mg/dL 207 (H) 163 (H) 114 (H) 182 (H)  Results for Raymond Kerr, Raymond Kerr (MRN [id number]) as of 10/06/2020 09:41   Ref. Range 10/05/2020 20:02  Hemoglobin A1C Latest Ref Range: 4.8 - 5.6 % 10.3 (H)   (248 mg/dl)    Admit with: Acute CVA, with expressive dysphasia and left facial droop   History: DM   Home DM Meds: Amaryl 4 mg daily                             Actos 30 mg daily   Current Orders: Novolog Moderate Correction Scale/ SSI (0-15 units) Q4 hours                            Amaryl 4 mg daily                            Actos 30 mg daily     PCP: Dr. Ginette Pitman with Raymond Kerr Last seen 10/01/2020  Needs better glucose control at home--Current A1c= 10.3% and pt admitted with CVA   Met w/ pt at bedside again this morning.  Fiancee not at bedside.  Pt lying with his eyes closed.  Opened his eyes to my voice but had a hard time staying awake.  Tried to answer my questions but was not able to verbalize much.  I attempted to review his A1c and current CBG levels.  Tried to verify home diabetes meds again--pt nodded yes when I asked if he takes Actos and Amaryl at home.  Stated he does not miss doses.  Looks like pt still having significant weakness and may be candidate for CIR.  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  Will follow for discharge diabetes needs.

## 2020-10-07 NOTE — Progress Notes (Signed)
Physical Therapy Treatment Patient Details Name: Raymond Kerr MRN: [id number] DOB: Nov 06, 1955 Today's Date: 10/07/2020    History of Present Illness 65 y.o. African-American male with medical history significant for type II diabetes mellitus, GERD and hypertension, who presented to the emergency room with acute onset of diplopia and generalized weakness that started 8 hours prior to presentation.  The patient has been having expressive aphasia. 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 No hemorrhage or mass effect."    PT Comments    Patient is agreeable to PT. Supportive friend at bedside during session and advocating for rehab and increased mobility efforts.  Patient needs increased time for processing and problem solving and is able to follow single step commands with extra time. Patient required minimal assistance for bed mobility and standing with bed height elevated. Patient needs up to Mod A for ambulation using rolling walker with right lateral lean and one bout of loss of balance that required assistance to correct to midline.  Patient is not at his baseline level of functional mobility and will need continued PT to maximize independence. CIR recommended at discharge.     Follow Up Recommendations  CIR;Supervision for mobility/OOB     Equipment Recommendations  Other (comment) (to be determined at next leve of care)    Recommendations for Other Services       Precautions / Restrictions Precautions Precautions: Fall Restrictions Weight Bearing Restrictions: No    Mobility  Bed Mobility Overal bed mobility: Needs Assistance Bed Mobility: Supine to Sit;Sit to Supine     Supine to sit: Min guard Sit to supine: Min assist   General bed mobility comments: assistance for RLE support to return to bed. increased time required and cues for sequencing provided    Transfers Overall transfer level: Needs  assistance Equipment used: Rolling walker (2 wheeled) Transfers: Sit to/from Stand Sit to Stand: Min assist;From elevated surface         General transfer comment: verbal cues for hand placement and sequending. assistance for lifting provided. two bouts of standing performed  Ambulation/Gait Ambulation/Gait assistance: Min assist;Mod assist Gait Distance (Feet): 64 Feet Assistive device: Rolling walker (2 wheeled) Gait Pattern/deviations: Step-to pattern;Decreased stance time - right;Decreased weight shift to left;Staggering right Gait velocity: decreased   General Gait Details: patient needs verbal cues and occasional assistance for rolling walker negotiation. faciliation for weight shifting left due to intermittent right lateral lean. patient with one loss of balance at the end of ambulation that required Mod A to correct to midline   Stairs             Wheelchair Mobility    Modified Rankin (Stroke Patients Only)       Balance Overall balance assessment: Needs assistance Sitting-balance support: No upper extremity supported;Feet supported Sitting balance-Leahy Scale: Good     Standing balance support: Bilateral upper extremity supported Standing balance-Leahy Scale: Fair Standing balance comment: with bilateral UE support                            Cognition Arousal/Alertness: Awake/alert Behavior During Therapy: Flat affect Overall Cognitive Status: Impaired/Different from baseline Area of Impairment: Problem solving                             Problem Solving: Slow processing General Comments: patient needs increased time for problem solving and  processing. difficulty opening left eye lid fully      Exercises Other Exercises Other Exercises: Pt noted to be using L hand to feed breakfast upon OT's arrival, pt educated in importance of involving dominant RUE into ADL    General Comments        Pertinent Vitals/Pain Pain  Assessment: No/denies pain    Home Living                      Prior Function            PT Goals (current goals can now be found in the care plan section) Acute Rehab PT Goals Patient Stated Goal: To feel better PT Goal Formulation: With patient Time For Goal Achievement: 10/20/20 Potential to Achieve Goals: Good Progress towards PT goals: Progressing toward goals    Frequency    7X/week      PT Plan Current plan remains appropriate    Co-evaluation              AM-PAC PT "6 Clicks" Mobility   Outcome Measure  Help needed turning from your back to your side while in a flat bed without using bedrails?: A Little Help needed moving from lying on your back to sitting on the side of a flat bed without using bedrails?: A Little Help needed moving to and from a bed to a chair (including a wheelchair)?: A Lot Help needed standing up from a chair using your arms (e.g., wheelchair or bedside chair)?: A Lot Help needed to walk in hospital room?: A Lot Help needed climbing 3-5 steps with a railing? : A Lot 6 Click Score: 14    End of Session Equipment Utilized During Treatment: Gait belt Activity Tolerance: Patient tolerated treatment well;Patient limited by fatigue Patient left: in bed;with family/visitor present;with call bell/phone within reach Nurse Communication: Mobility status PT Visit Diagnosis: Unsteadiness on feet (R26.81);Other abnormalities of gait and mobility (R26.89);Muscle weakness (generalized) (M62.81);Other symptoms and signs involving the nervous system (R29.898)     Time: 7253-6644 PT Time Calculation (min) (ACUTE ONLY): 34 min  Charges:  $Gait Training: 8-22 mins $Therapeutic Activity: 8-22 mins                     Donna Bernard, PT, MPT    Ina Homes 10/07/2020, 1:55 PM

## 2020-10-07 NOTE — Progress Notes (Signed)
Pt stated he did not wear CPAP at home and chose not to wear one here at this time. Pt aware that a CPAP will be made available if needed. RN aware and at bedside.

## 2020-10-07 NOTE — Plan of Care (Signed)
I spoke with wife about TEE tomorrow. She would like to discuss with cardiology before procedure but is amenable to having it done tomorrow. She will arrive at hospital at 0900 and phone # is listed in epic under patient profile.   Bing Neighbors, MD [facility] [phone]  If 7pm- 7am, please page neurology on call as listed in AMION.

## 2020-10-07 NOTE — Progress Notes (Signed)
Occupational Therapy Treatment Patient Details Name: Raymond Kerr MRN: [id number] DOB: 07/27/1955 Today's Date: 10/07/2020    History of present illness 65 y.o. African-American male with medical history significant for type II diabetes mellitus, GERD and hypertension, who presented to the emergency room with acute onset of diplopia and generalized weakness that started 8 hours prior to presentation.  The patient has been having expressive aphasia. 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 No hemorrhage or mass effect."   OT comments  Pt seen for OT tx this morning, finishing breakfast. Pt noted to be using LUE to self feed pancakes despite being R hand dominant. Pt encouraged to use RUE more to support recovery. Pt required VC + supervision for bed mobility, with increased processing time to problem solve. Pt stood with RW + VC for hand placement requiring MIN A. Stood at the sink for grooming tasks requiring MIN A for static standing balance and VC for UE support on RW intermittently while washing hands/face to support safety/balance. Pt endorsed feeling a little better. Still endorses double vision when L eyelid is lifted (difficulty lifting himself). Pt tolerated session very well. Motivated to improve and return to PLOF. Continue to recommend CIR for continued high intensity skilled OT services. TOC/MD notified.     Follow Up Recommendations  CIR    Equipment Recommendations  Other (comment);3 in 1 bedside commode (2WW)    Recommendations for Other Services      Precautions / Restrictions Precautions Precautions: Fall Restrictions Weight Bearing Restrictions: No       Mobility Bed Mobility Overal bed mobility: Needs Assistance Bed Mobility: Supine to Sit;Sit to Supine     Supine to sit: Min guard Sit to supine: Min guard   General bed mobility comments: increased time/effort, 1 verbal cue for sequencing/hand  placement for bed rail assist    Transfers Overall transfer level: Needs assistance Equipment used: Rolling walker (2 wheeled) Transfers: Sit to/from Stand Sit to Stand: Min guard;Min assist;From elevated surface         General transfer comment: VC for hand placement    Balance Overall balance assessment: Needs assistance Sitting-balance support: No upper extremity supported;Feet supported Sitting balance-Leahy Scale: Fair     Standing balance support: Single extremity supported;No upper extremity supported;During functional activity Standing balance-Leahy Scale: Poor Standing balance comment: Requires UE assist intermittently while washing hands/face at sink                           ADL either performed or assessed with clinical judgement   ADL Overall ADL's : Needs assistance/impaired     Grooming: Wash/dry hands;Wash/dry face;Standing Grooming Details (indicate cue type and reason): while standing inside RW with VC for intermittent UE support on countertop for stability/safety, VC for locating soap and paper towel dispensers on the wall                                     Vision Baseline Vision/History: No visual deficits Patient Visual Report: Blurring of vision;Diplopia Additional Comments: continues to have diplopia when L eyelid is held open, difficulty opening L eyelid himself. Diplopia resolves with L eye closed.   Perception     Praxis      Cognition Arousal/Alertness: Awake/alert Behavior During Therapy: Flat affect Overall Cognitive Status: Impaired/Different from baseline Area of Impairment: Problem  solving                             Problem Solving: Slow processing          Exercises Other Exercises Other Exercises: Pt noted to be using L hand to feed breakfast upon OT's arrival, pt educated in importance of involving dominant RUE into ADL   Shoulder Instructions       General Comments       Pertinent Vitals/ Pain          Home Living                                          Prior Functioning/Environment              Frequency  Min 3X/week        Progress Toward Goals  OT Goals(current goals can now be found in the care plan section)  Progress towards OT goals: Progressing toward goals  Acute Rehab OT Goals Patient Stated Goal: To feel better OT Goal Formulation: With patient/family Time For Goal Achievement: 10/20/20 Potential to Achieve Goals: Good  Plan Discharge plan remains appropriate;Frequency remains appropriate    Co-evaluation                 AM-PAC OT "6 Clicks" Daily Activity     Outcome Measure   Help from another person eating meals?: None Help from another person taking care of personal grooming?: A Little Help from another person toileting, which includes using toliet, bedpan, or urinal?: A Little Help from another person bathing (including washing, rinsing, drying)?: A Lot Help from another person to put on and taking off regular upper body clothing?: A Little Help from another person to put on and taking off regular lower body clothing?: A Lot 6 Click Score: 17    End of Session Equipment Utilized During Treatment: Gait belt;Rolling walker  OT Visit Diagnosis: Other abnormalities of gait and mobility (R26.89);Hemiplegia and hemiparesis Hemiplegia - Right/Left: Right Hemiplegia - dominant/non-dominant: Dominant Hemiplegia - caused by: Cerebral infarction   Activity Tolerance Patient tolerated treatment well   Patient Left in bed;with call bell/phone within reach;with bed alarm set   Nurse Communication          Time: 3154-0086 OT Time Calculation (min): 19 min  Charges: OT General Charges $OT Visit: 1 Visit OT Treatments $Self Care/Home Management : 8-22 mins  Wynona Canes, MPH, MS, OTR/L ascom [phone] 10/07/20, 12:36 PM

## 2020-10-08 ENCOUNTER — Inpatient Hospital Stay: Payer: Medicaid Other

## 2020-10-08 ENCOUNTER — Inpatient Hospital Stay
Admit: 2020-10-08 | Discharge: 2020-10-08 | Disposition: A | Discharge status: Home / Self Care | Payer: Medicaid Other | Attending: Internal Medicine | Admitting: Internal Medicine

## 2020-10-08 ENCOUNTER — Encounter
Admission: EM | Disposition: A | Discharge status: Home / Self Care | Payer: Self-pay | Source: Home / Self Care | Attending: Internal Medicine

## 2020-10-08 HISTORY — PX: TEE WITHOUT CARDIOVERSION: SHX5443

## 2020-10-08 LAB — GLUCOSE, CAPILLARY
Glucose-Capillary: 105 mg/dL — ABNORMAL HIGH (ref 70–99)
Glucose-Capillary: 136 mg/dL — ABNORMAL HIGH (ref 70–99)
Glucose-Capillary: 140 mg/dL — ABNORMAL HIGH (ref 70–99)
Glucose-Capillary: 165 mg/dL — ABNORMAL HIGH (ref 70–99)
Glucose-Capillary: 216 mg/dL — ABNORMAL HIGH (ref 70–99)

## 2020-10-08 IMAGING — CT CT HEAD W/O CM
4 of 5 series · 16 of 47 positions shown, 18 images · non-contrast
Comparison: MRI studies 2 days ago.  Head CT 3 days ago.

CLINICAL DATA: Neuro deficit, acute, stroke suspected. Elevated
blood pressure. Left facial weakness.

EXAM:
CT HEAD WITHOUT CONTRAST
TECHNIQUE: Contiguous axial images were obtained from the base of the skull
through the vertex without intravenous contrast.

[Series 2: head wo · axial · 0.41mm/px · z∈[-163,-43]mm · 7 of 32 slices shown, 9 images (1 of 2)]
[im 4/32  brain]
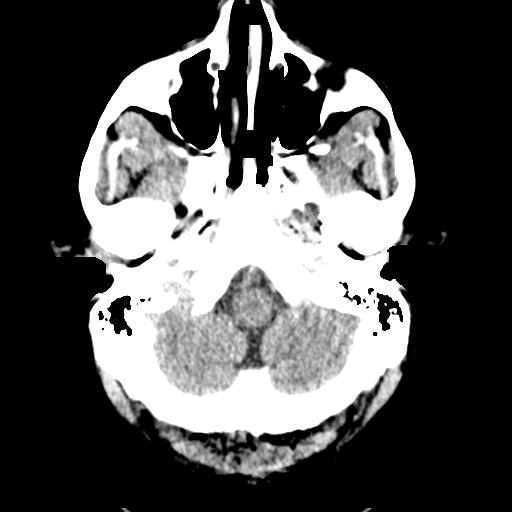
[im 4/32  bone]
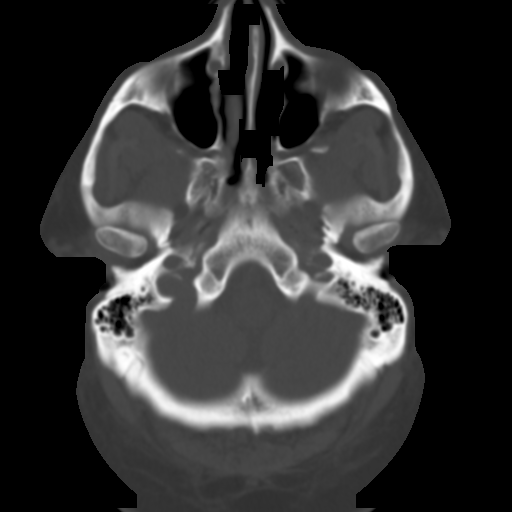
[im 8/32  brain]
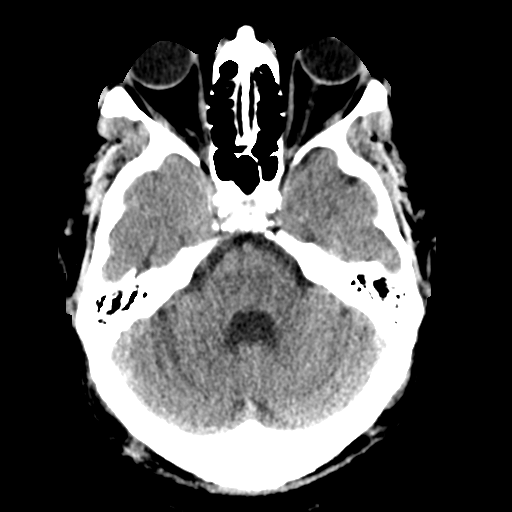
[im 12/32  brain]
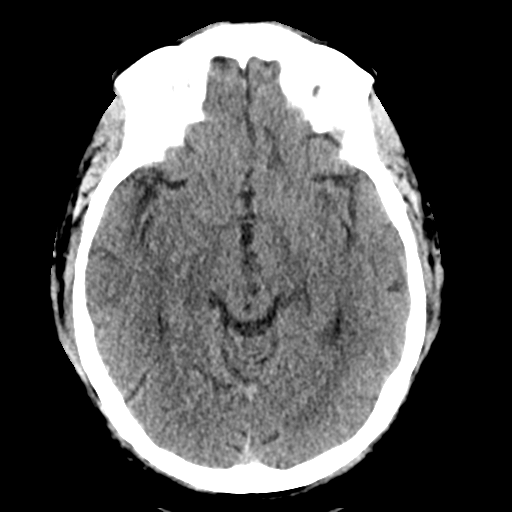
[im 16/32  brain]
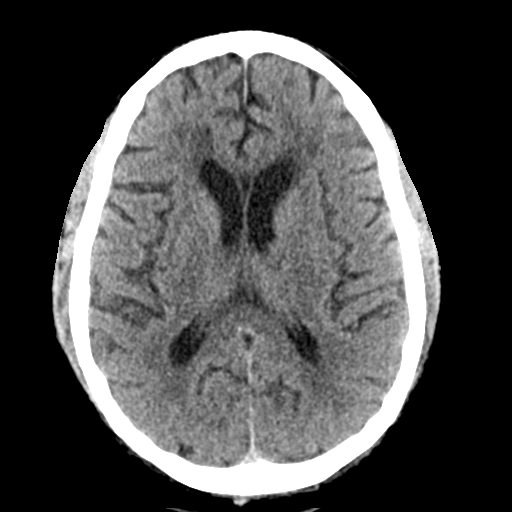
[im 20/32  brain]
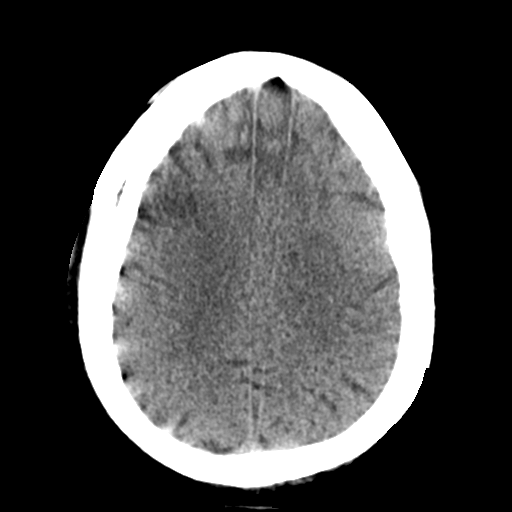
[im 20/32  bone]
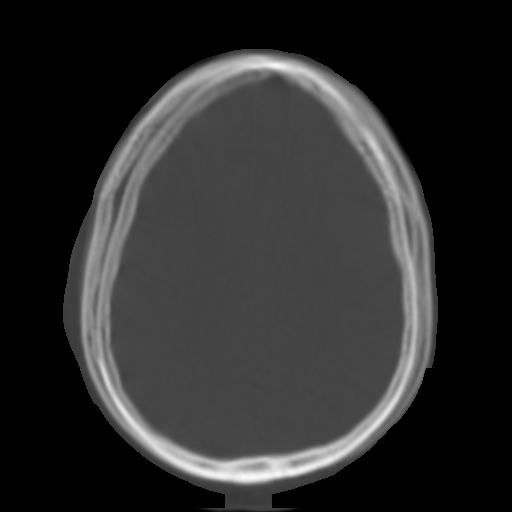
[im 24/32  brain]
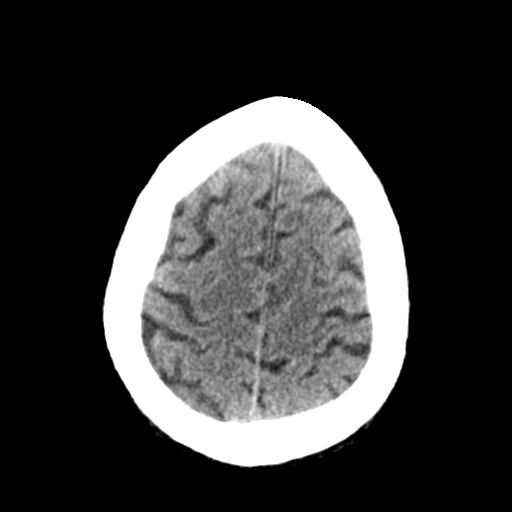
[im 28/32  brain]
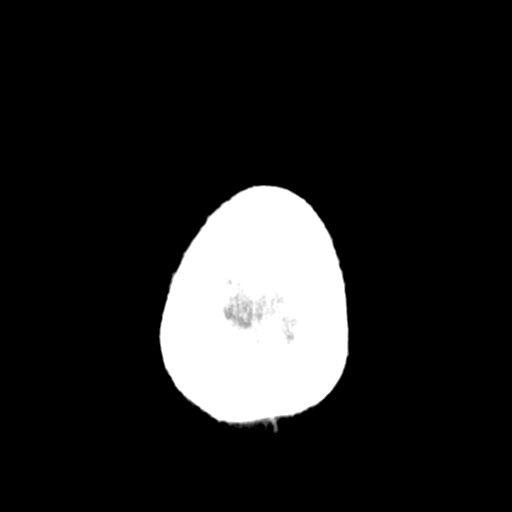

[Series 4: coronal soft tissue · coronal · 0.30mm/px · 3 of 73 slices shown]
[im 25/73  brain]
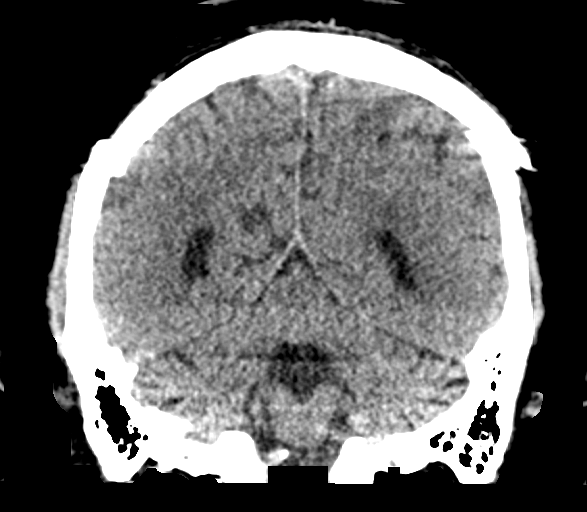
[im 33/73  brain]
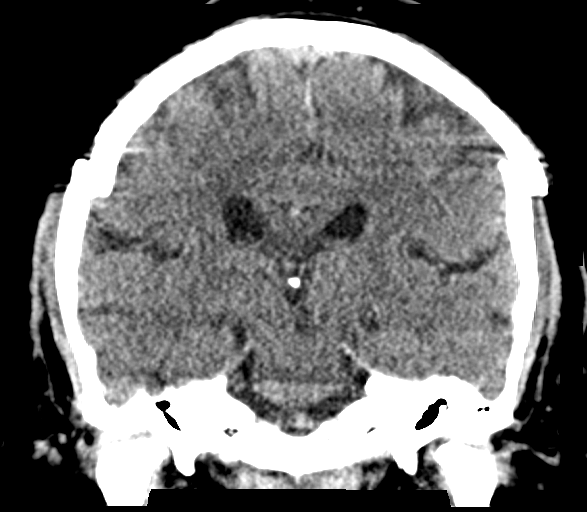
[im 40/73  brain]
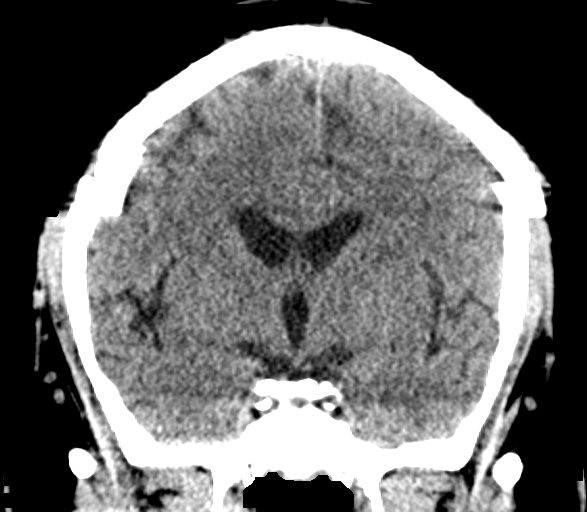

[Series 5: sagittal soft tissue · sagittal · 0.32mm/px · 3 of 64 slices shown]
[im 22/64  brain]
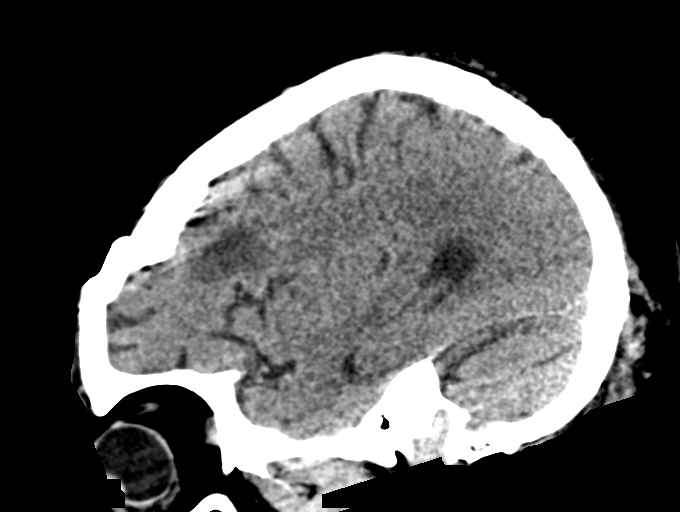
[im 32/64  brain]
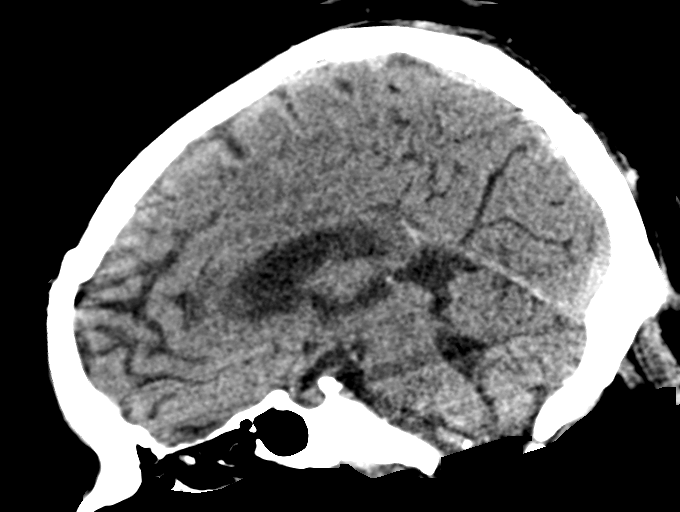
[im 43/64  brain]
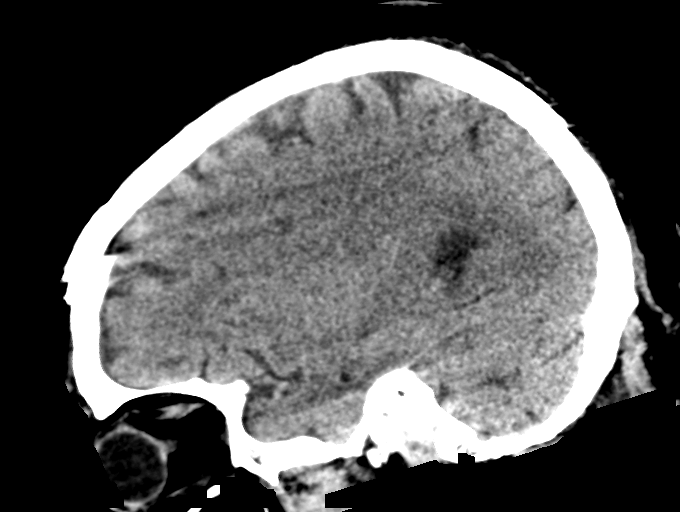

[Series 6: head wo · axial · 0.41mm/px · z∈[-105,-65]mm · 3 of 20 slices shown (2 of 2)]
[im 4/20  brain]
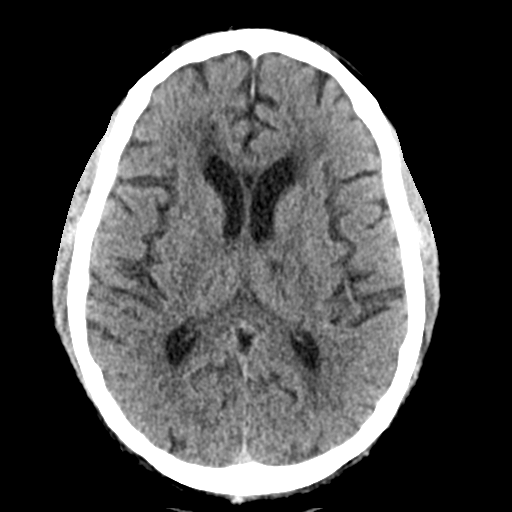
[im 8/20  brain]
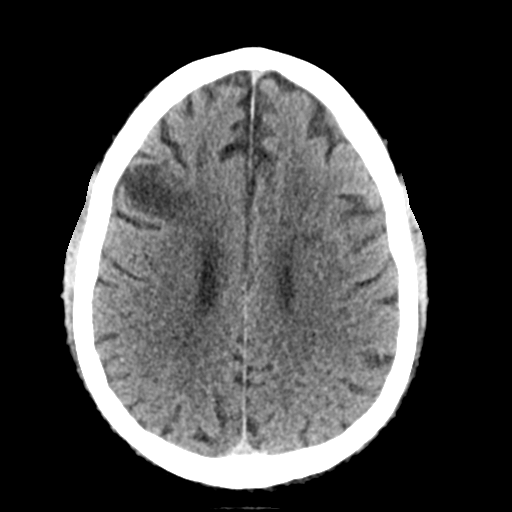
[im 12/20  brain]
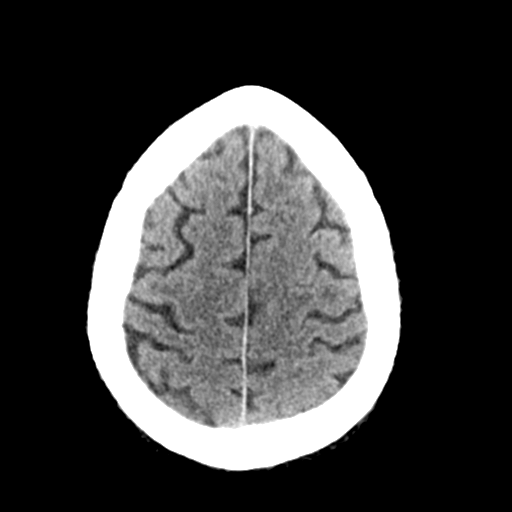

[16 of 47 positions shown; findings below may reference images not displayed]

FINDINGS: Brain: Old infarction in the left cerebellum as previously seen. No
acute cerebellar infarction. Low-density in the left medial mid
brain and left anteromedial thalamus consistent with known acute
infarction in those locations. No evidence of extension, hemorrhage
or significant mass effect. Low-density related to a left frontal
white matter infarction at the inferior frontal lobe. Infarction at
the medial left frontoparietal vertex visible as an area of
low-density. Other smaller infarctions described by MRI are not
visible by CT. No new or progressive lesions. There is old cortical
and subcortical infarction in the right frontal lobe.

Vascular: There is atherosclerotic calcification of the major
vessels at the base of the brain.

Skull: Negative

Sinuses/Orbits: Clear/normal

Other: None
IMPRESSION: No evidence of new or progressive insult. No evidence of hemorrhagic
transformation.

Of the acute infarctions shown by MRI, the infarctions in the left
midbrain, thalamus, inferior frontal lobe and left frontoparietal
vertex are visible by CT is areas of low-density.

## 2020-10-08 SURGERY — ECHOCARDIOGRAM, TRANSESOPHAGEAL
Anesthesia: Moderate Sedation

## 2020-10-08 MED ORDER — MIDAZOLAM HCL 5 MG/5ML IJ SOLN
INTRAMUSCULAR | Status: AC
  Filled 2020-10-08: qty 5

## 2020-10-08 MED ORDER — SODIUM CHLORIDE 0.9 % IV SOLN: INTRAVENOUS | Status: DC

## 2020-10-08 MED ORDER — FENTANYL CITRATE (PF) 100 MCG/2ML IJ SOLN
INTRAMUSCULAR | Status: AC | PRN
  Administered 2020-10-08: 25 ug via INTRAVENOUS

## 2020-10-08 MED ORDER — BUTAMBEN-TETRACAINE-BENZOCAINE 2-2-14 % EX AERO
INHALATION_SPRAY | CUTANEOUS | Status: AC
  Filled 2020-10-08: qty 5

## 2020-10-08 MED ORDER — BUTAMBEN-TETRACAINE-BENZOCAINE 2-2-14 % EX AERO
INHALATION_SPRAY | CUTANEOUS | Status: AC | PRN
  Administered 2020-10-08: 1 via TOPICAL

## 2020-10-08 MED ORDER — MIDAZOLAM HCL 5 MG/5ML IJ SOLN
INTRAMUSCULAR | Status: AC | PRN
  Administered 2020-10-08: 1 mg via INTRAVENOUS

## 2020-10-08 MED ORDER — FENTANYL CITRATE (PF) 100 MCG/2ML IJ SOLN
INTRAMUSCULAR | Status: AC
  Filled 2020-10-08: qty 2

## 2020-10-08 MED ORDER — LIDOCAINE VISCOUS HCL 2 % MT SOLN
OROMUCOSAL | Status: AC
  Filled 2020-10-08: qty 15

## 2020-10-08 MED ORDER — LIDOCAINE VISCOUS HCL 2 % MT SOLN
OROMUCOSAL | Status: AC | PRN
  Administered 2020-10-08: 15 mL via OROMUCOSAL

## 2020-10-08 NOTE — Progress Notes (Signed)
Physical Therapy Treatment Patient Details Name: Raymond Kerr MRN: [id number] DOB: 1955/09/05 Today's Date: 10/08/2020    History of Present Illness 65 y.o. African-American male with medical history significant for type II diabetes mellitus, GERD and hypertension, who presented to the emergency room with acute onset of diplopia and generalized weakness that started 8 hours prior to presentation.  The patient has been having expressive aphasia. 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 No hemorrhage or mass effect."    PT Comments    Pt seated in recliner with significant other present throughout treatment. Pt is disoriented to place and situation. Pt is able to follow one-step commands with increased processing time, but demonstrates lack of motor planning w/ RUE and RLE. Pt was unable to place RLE under BOS during sit <>stand and unable to maintain positioning without PT assist. Pt was unable to lift RUE without flexion synergy, PT able to passively extend elbow with normal tone being present.   A significant change in functional mobility from initial evaluation was noted. Pt required max-A + 2 for physical assist during sit <> stand, RW. Pt was unable to maintain BLE extension without physical assist and demonstrates increased R lateral trunk lean requiring full physical assist. Pt transferred recliner > bed with/ sliding board + 3 assistance, max-A. Medical team notified of observed functional changes. Discharge recommendations remain CIR.Skilled PT intervention is indicated to address deficits in function, mobility, and to return to PLOF as able.     Follow Up Recommendations  CIR;Supervision for mobility/OOB     Equipment Recommendations  Other (comment) (TBD next venue of care)    Recommendations for Other Services       Precautions / Restrictions Precautions Precautions: Fall Restrictions Weight Bearing Restrictions:  No    Mobility  Bed Mobility Overal bed mobility: Needs Assistance Bed Mobility: Supine to Sit     Supine to sit: Mod assist Sit to supine: Max assist (+3 physical assist)   General bed mobility comments: MOD A for trunk support    Transfers Overall transfer level: Needs assistance Equipment used: Rolling walker (2 wheeled) Transfers: Sit to/from Stand Sit to Stand: Max assist;+2 physical assistance Stand pivot transfers: Mod assist;+2 physical assistance       General transfer comment: Pt required RLE placement and multi-modal cues for hand placement w/ +2 physical assist. Pt demonstrates increased R lateral trunk lean and is unable to shift weight.  Ambulation/Gait             General Gait Details: not appropriate for today's session   Stairs             Wheelchair Mobility    Modified Rankin (Stroke Patients Only)       Balance Overall balance assessment: Needs assistance Sitting-balance support: Feet supported;Single extremity supported Sitting balance-Leahy Scale: Good Sitting balance - Comments: good static balance at EOB during UB dressing   Standing balance support: Bilateral upper extremity supported;During functional activity Standing balance-Leahy Scale: Poor Standing balance comment: Required Max A +2 for support secondary to R lateral trunk lean and RLE knee buckling                            Cognition Arousal/Alertness: Awake/alert Behavior During Therapy: Flat affect Overall Cognitive Status: Impaired/Different from baseline Area of Impairment: Problem solving;Orientation  Orientation Level: Disoriented to;Place;Situation           Problem Solving: Slow processing General Comments: Pt requires increased time for processing commands and requires multi-modal cues for mobilizing both RUE, RLE      Exercises      General Comments        Pertinent Vitals/Pain Pain Assessment: No/denies  pain    Home Living                      Prior Function            PT Goals (current goals can now be found in the care plan section) Acute Rehab PT Goals Patient Stated Goal: To feel better PT Goal Formulation: With patient Time For Goal Achievement: 10/20/20 Potential to Achieve Goals: Good Progress towards PT goals: Progressing toward goals    Frequency    7X/week      PT Plan Current plan remains appropriate    Co-evaluation              AM-PAC PT "6 Clicks" Mobility   Outcome Measure  Help needed turning from your back to your side while in a flat bed without using bedrails?: A Little Help needed moving from lying on your back to sitting on the side of a flat bed without using bedrails?: A Lot Help needed moving to and from a bed to a chair (including a wheelchair)?: A Lot Help needed standing up from a chair using your arms (e.g., wheelchair or bedside chair)?: A Lot Help needed to walk in hospital room?: A Lot Help needed climbing 3-5 steps with a railing? : Total 6 Click Score: 12    End of Session Equipment Utilized During Treatment: Gait belt Activity Tolerance: Patient tolerated treatment well;Patient limited by fatigue Patient left: in bed;with family/visitor present;with call bell/phone within reach;with bed alarm set;with nursing/sitter in room Nurse Communication: Mobility status PT Visit Diagnosis: Unsteadiness on feet (R26.81);Other abnormalities of gait and mobility (R26.89);Muscle weakness (generalized) (M62.81);Other symptoms and signs involving the nervous system (R29.898)     Time: 1130-1200 PT Time Calculation (min) (ACUTE ONLY): 30 min  Charges:                        [employer], SPT

## 2020-10-08 NOTE — Progress Notes (Addendum)
PROGRESS NOTE    Raymond Kerr  AJH:[id number] DOB: [birth year] DOA: 10/06/2020 PCP: Barbette Reichmann, MD    Brief Narrative:  Raymond Kerr is a 65 y.o. African-American male with medical history significant for type II diabetes mellitus, GERD and hypertension, who presented to the emergency room with acute onset of diplopia and generalized weakness that started 8 hours prior to presentation.  The patient has been having expressive dysphasia. On arrival to the emergency room here it appears that the patient has left-sided weakness and difficulty with speech.  MRI of the brain showed small acute infarcts of the left paramedian frontal lobe and ventromedial left thalamus. Neurology consult has been obtained. MRA head showed a short segment of the left ACA A2 segment.  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  Acute left ACA ischemic stroke. Neurology following Continue statin, plavix and asa TEE today CIR placement pending    2.  Type 2 diabetes. BG stable Continue riss  3.  Hypokalemia. Improved.   DVT prophylaxis: Lovenox Code Status: full Family Communication: wife updated Disposition Plan:    Status is: Inpatient  Remains inpatient appropriate because:Inpatient level of care appropriate due to severity of illness  Dispo: The patient is from: Home              Anticipated d/c is to:  CIR              Patient currently is not medically stable to d/c.   Difficult to place patient No        I/O last 3 completed shifts: In: 480 [P.O.:480] Out: 2350 [Urine:2350] No intake/output data recorded.     Consultants:  Neurology  Procedures: None  Antimicrobials: None   Subjective: No issues overnight. Fiance at bedside did not report  anything. They are agreeable to tee today  Objective: Vitals:   10/07/20 1942 10/08/20 0008 10/08/20 0452 10/08/20 0714  BP: 129/72 124/66 132/67 122/73  Pulse: 81 79 73 67  Resp:  16 16 18   Temp: 98.2 F (36.8 C) 98.4 F (36.9 C) 97.9 F (36.6 C) 97.6 F (36.4 C)  TempSrc:      SpO2: 99% 100% 98% 99%  Weight:      Height:        Intake/Output Summary (Last 24 hours) at 10/08/2020 0809 Last data filed at 10/08/2020 0500 Gross per 24 hour  Intake 480 ml  Output 1500 ml  Net -1020 ml   Filed Weights   10/05/20 1950 10/06/20 2045  Weight: 118.8 kg 120.4 kg    Examination: NAD, calm CTA no wheeze rales rhonchi's Regular S1-S2 no gallops Soft benign positive bowel sounds Lower extremity with bilateral edema, chronic skin changes. Neuro exam not very verbal today.  Does follow my command is following my fingers for different eye movement. Strength 5/5 UE, lifts b/l LE.    Data Reviewed: I have personally reviewed following labs and imaging studies  CBC: Recent Labs  Lab 10/05/20 2002 10/06/20 0818  WBC 5.3 6.1  NEUTROABS 2.9  --   HGB 13.2 10.4*  HCT 40.5 30.9*  MCV 89.4 90.6  PLT 277 265   Basic Metabolic Panel: Recent Labs  Lab 10/05/20 2002 10/06/20 0818 10/07/20 0444  NA  135 136 136  K 3.7 3.2* 3.8  CL 100 101 105  CO2 27 26 27   GLUCOSE 260* 167* 194*  BUN 14 13 14   CREATININE 1.07 1.09 1.10  CALCIUM 9.1 8.5* 8.6*  MG  --   --  1.7   GFR: Estimated Creatinine Clearance: 89.7 mL/min (by C-G formula based on SCr of 1.1 mg/dL). Liver Function Tests: Recent Labs  Lab 10/05/20 2002  AST 27  ALT 14  ALKPHOS 70  BILITOT 0.6  PROT 7.4  ALBUMIN 3.3*   No results for input(s): LIPASE, AMYLASE in the last 168 hours. No results for input(s): AMMONIA in the last 168 hours. Coagulation Profile: Recent Labs  Lab 10/05/20 2002  INR 1.0   Cardiac Enzymes: No results for input(s): CKTOTAL, CKMB, CKMBINDEX, TROPONINI in the last 168 hours. BNP  (last 3 results) No results for input(s): PROBNP in the last 8760 hours. HbA1C: Recent Labs    10/05/20 2002 10/06/20 0818  HGBA1C 10.3* 10.1*   CBG: Recent Labs  Lab 10/07/20 1749 10/07/20 1943 10/08/20 0008 10/08/20 0454 10/08/20 0721  GLUCAP 174* 212* 165* 136* 105*   Lipid Profile: Recent Labs    10/05/20 2002 10/06/20 0818  CHOL 199 174  HDL 35* 30*  LDLCALC 136* 10/07/20*  TRIG 138 102  CHOLHDL 5.7 5.8   Thyroid Function Tests: No results for input(s): TSH, T4TOTAL, FREET4, T3FREE, THYROIDAB in the last 72 hours. Anemia Panel: No results for input(s): VITAMINB12, FOLATE, FERRITIN, TIBC, IRON, RETICCTPCT in the last 72 hours. Sepsis Labs: No results for input(s): PROCALCITON, LATICACIDVEN in the last 168 hours.  Recent Results (from the past 240 hour(s))  Resp Panel by RT-PCR (Flu A&B, Covid) Nasopharyngeal Swab     Status: None   Collection Time: 10/06/20  1:23 AM   Specimen: Nasopharyngeal Swab; Nasopharyngeal(NP) swabs in vial transport medium  Result Value Ref Range Status   SARS Coronavirus 2 by RT PCR NEGATIVE NEGATIVE Final    Comment: (NOTE) SARS-CoV-2 target nucleic acids are NOT DETECTED.  The SARS-CoV-2 RNA is generally detectable in upper respiratory specimens during the acute phase of infection. The lowest concentration of SARS-CoV-2 viral copies this assay can detect is 138 copies/mL. A negative result does not preclude SARS-Cov-2 infection and should not be used as the sole basis for treatment or other patient management decisions. A negative result may occur with  improper specimen collection/handling, submission of specimen other than nasopharyngeal swab, presence of viral mutation(s) within the areas targeted by this assay, and inadequate number of viral copies(<138 copies/mL). A negative result must be combined with clinical observations, patient history, and epidemiological information. The expected result is Negative.  Fact Sheet for  Patients:  073  Fact Sheet for Healthcare Providers:  10/08/20  This test is no t yet approved or cleared by the [employer] FDA and  has been authorized for detection and/or diagnosis of SARS-CoV-2 by FDA under an Emergency Use Authorization (EUA). This EUA will remain  in effect (meaning this test can be used) for the duration of the COVID-19 declaration under Section 564(b)(1) of the Act, 21 U.S.C.section 360bbb-3(b)(1), unless the authorization is terminated  or revoked sooner.       Influenza A by PCR NEGATIVE NEGATIVE Final   Influenza B by PCR NEGATIVE NEGATIVE Final    Comment: (NOTE) The Xpert Xpress SARS-CoV-2/FLU/RSV plus assay is intended as an aid in the diagnosis of influenza from Nasopharyngeal swab specimens and should not be used as a  sole basis for treatment. Nasal washings and aspirates are unacceptable for Xpert Xpress SARS-CoV-2/FLU/RSV testing.  Fact Sheet for Patients: BloggerCourse.comhttps://www.fda.gov/media/152166/download  Fact Sheet for Healthcare Providers: SeriousBroker.ithttps://www.fda.gov/media/152162/download  This test is not yet approved or cleared by the [place] FDA and has been authorized for detection and/or diagnosis of SARS-CoV-2 by FDA under an Emergency Use Authorization (EUA). This EUA will remain in effect (meaning this test can be used) for the duration of the COVID-19 declaration under Section 564(b)(1) of the Act, 21 U.S.C. section 360bbb-3(b)(1), unless the authorization is terminated or revoked.  Performed at [facility], [address] [place], [place] [address]          Radiology Studies: ECHOCARDIOGRAM COMPLETE  Result Date: 10/06/2020    ECHOCARDIOGRAM REPORT   Patient Name:   Raymond Kerr Date of Exam: 10/06/2020 Medical Rec #:  [id number]     Height:       72.0 in Accession #:    [id number]    Weight:       262.0 lb Date of Birth:  12-Oct-1955    BSA:           2.390 m Patient Age:    64 years      BP:           119/78 mmHg Patient Gender: M             HR:           71 bpm. Exam Location:  [facility] Procedure: 2D Echo, Color Doppler and Cardiac Doppler Indications:     TIA G45.9  History:         Patient has no prior history of Echocardiogram examinations.                  Risk Factors:Diabetes and Hypertension. GERD.  Sonographer:     Cristela BlueJerry Hege Referring Phys:  [id number] JAN A MANSY Diagnosing Phys: Arnoldo HookerBruce Kowalski MD  Sonographer Comments: Technically challenging study due to limited acoustic windows. The only view obtainable was parasternal. IMPRESSIONS  1. Left ventricular ejection fraction, by estimation, is 40 to 45%. The left ventricle has mildly decreased function. The left ventricle demonstrates regional wall motion abnormalities (see scoring diagram/findings for description). Left ventricular diastolic parameters were normal.  2. Right ventricular systolic function is normal. The right ventricular size is normal.  3. The mitral valve is normal in structure. Mild mitral valve regurgitation.  4. The aortic valve is normal in structure. Aortic valve regurgitation is not visualized. FINDINGS  Left Ventricle: Left ventricular ejection fraction, by estimation, is 40 to 45%. The left ventricle has mildly decreased function. The left ventricle demonstrates regional wall motion abnormalities. Moderate hypokinesis of the left ventricular, mid-apical anteroseptal wall. The left ventricular internal cavity size was normal in size. There is no left ventricular hypertrophy. Left ventricular diastolic parameters were normal. Right Ventricle: The right ventricular size is normal. No increase in right ventricular wall thickness. Right ventricular systolic function is normal. Left Atrium: Left atrial size was normal in size. Right Atrium: Right atrial size was normal in size. Pericardium: There is no evidence of pericardial effusion. Mitral Valve: The mitral valve is normal in  structure. Mild mitral valve regurgitation. Tricuspid Valve: The tricuspid valve is normal in structure. Tricuspid valve regurgitation is mild. Aortic Valve: The aortic valve is normal in structure. Aortic valve regurgitation is not visualized. Pulmonic Valve: The pulmonic valve was normal in structure. Pulmonic valve regurgitation is not visualized. Aorta: The aortic root and  ascending aorta are structurally normal, with no evidence of dilitation. IAS/Shunts: The interatrial septum was not assessed.  LEFT VENTRICLE PLAX 2D LVIDd:         4.30 cm LVIDs:         3.40 cm LV PW:         1.10 cm LV IVS:        1.20 cm LVOT diam:     2.00 cm LVOT Area:     3.14 cm  LEFT ATRIUM         Index LA diam:    2.90 cm 1.21 cm/m   AORTA Ao Root diam: 3.60 cm  SHUNTS Systemic Diam: 2.00 cm Arnoldo Hooker MD Electronically signed by Arnoldo Hooker MD Signature Date/Time: 10/06/2020/12:44:47 PM    Final         Scheduled Meds:   stroke: mapping our early stages of recovery book   Does not apply Once   amLODipine  5 mg Oral Daily   aspirin EC  81 mg Oral Daily   atorvastatin  80 mg Oral Daily   clopidogrel  75 mg Oral Daily   enoxaparin (LOVENOX) injection  0.5 mg/kg Subcutaneous Q24H   glimepiride  4 mg Oral Q breakfast   insulin aspart  0-15 Units Subcutaneous Q4H   losartan  100 mg Oral Daily   pioglitazone  30 mg Oral Daily   Continuous Infusions:   LOS: 1 day    Time spent: 35 minutes with more than 50% on COC    Lynn Ito, MD [facility]   To contact the attending provider between 7A-7P or the covering provider during after hours 7P-7A, please log into the web site [email or web address] and access using universal [place] password for that web site. If you do not have the password, please call the hospital operator.  10/08/2020, 8:09 AM

## 2020-10-08 NOTE — Progress Notes (Signed)
Patient states that he does not want to use hospital bipap/cpap

## 2020-10-08 NOTE — Progress Notes (Signed)
*  PRELIMINARY RESULTS* Echocardiogram Echocardiogram Transesophageal has been performed.  Cristela Blue 10/08/2020, 1:52 PM

## 2020-10-08 NOTE — Progress Notes (Signed)
Neurology Progress Note  Patient ID: 65 yo gentleman with hx HTN, DM2, HL who presented with multiple neurologic sx and was found to have 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   Subjective: - Pt underwent TEE today which showed no e/o intracardiac clot - OT reported concern patient was more drowsy this AM and required more assistance. NIHSS per RN unchanged. Head CT of known subacute strokes with no acute changes. On my examination he is slightly drowsy from the sedation but has no focal neurologic deficits  Exam: Vitals:   10/08/20 1320 10/08/20 1330  BP: (!) 97/56 (!) 105/55  Pulse: 78 85  Resp: 20 16  Temp:    SpO2: 100% 100%   Physical Exam  LKT:GYBWL but drowsy, O x4. Follows multi-step commands.   HEENT: Atraumatic, normocephalic;mucous membranes moist; oropharynx clear, tongue without atrophy or fasciculations.  Neck: Supple, trachea midline.  Resp: CTAB, no w/r/r  CV: RRR, no m/g/r; nml S1 and S2. 2+ symmetric peripheral pulses.  Abd: soft/NT/ND; nabs x 4 quad  Extrem: Nml bulk; no cyanosis, clubbing, or edema.     Neuro:  *MS: Awake but drowsy, O x4. Follows multi-step commands.   *Speech: fluid, mild dysarthria, minimal WFD, able to name most objects, repetition is intact.   *CN:     I: Deferred    II,III: PERRLA, VFF by confrontation, optic discs not visualized 2/2 pupillary constriction    III,IV,VI: EOMI w/o nystagmus, severe ptosis on L    V: Sensation intact from V1 to V3 to LT    VII: Eyelid closure was full.  R UMN facial droop.    VIII: Hearing intact to voice    IX,X: Voice normal, palate elevates symmetrically     XI: SCM/trap 5/5 bilat    XII: Tongue protrudes midline, no atrophy or fasciculations   *Motor:   Normal bulk.  No tremor,  rigidity or bradykinesia. Pronator drift RUE. LUE and LLE full strength throughout. RUE full strength except delt 4+/5. RLE 4+/5 HF, KF, DF otherwise 5/5.   *Sensory: Intact to light touch, pinprick, temperature vibration throughout. Symmetric. Propioception intact bilat.  No double-simultaneous extinction.   *Coordination:  FNF intact bilat  *Reflexes:  2+ and symmetric throughout without clonus; toes down-going bilat  *Gait: deferred     NIHSS     1a Level of Conscious.: 0 1b LOC Questions: 0 1c LOC Commands: 0 2 Best Gaze: 1 3 Visual: 0 4 Facial Palsy: 1 5a Motor Arm - left: 0 5b Motor Arm - Right: 1 6a Motor Leg - Left: 0 6b Motor Leg - Right: 1 7 Limb Ataxia: 0 8 Sensory: 0 9 Best Language: 1 10 Dysarthria: 1 11 Extinct. and Inatten.: 0   TOTAL: 6   Impression: This is a 65 yo gentleman with hx HTN, DM2, HL who presented with multiple neurologic sx and was found to have 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 There is concern for potential central embolic source given his infarcts in both anterior and posterior circulation but both TTE and TEE showed no e/o intracardiac clot. Pt was  reported to be weaker today than yesterday by OT but my exam is unchanged except for some lethargy 2/2 residual sedation from TEE today. Repeat head CT showed nothing acute, only expected evolution of subacute ischemic infarct.  Recommendations: - No indication for permissive HTN >48 hrs out from sx onset. Goal normotension, appreciate cardiology assistance with this. Avoid hypotension. - Continue ASA 81mg  daily + plavix 75mg  daily x90 days given severe intracranial stenosis f/b ASA 81mg  daily after that - Atorvastatin 80mg  daily - STAT head CT for any change in neuro exam - Tele - PT/OT/SLP -  Stroke education - Amb referral to neurology upon discharge. He will need a Zio patch in clinic for ambulatory cardiac monitoring given infarcts in multiple vascular distributions that could suggest an embolic source. - I will re-examine tmrw after sedation wears off to ensure he is back to recent baseline.   , MD [facility] [phone]  If 7pm- 7am, please page neurology on call as listed in AMION.

## 2020-10-08 NOTE — CV Procedure (Signed)
Transesophageal echocardiogram preliminary report  Raymond Kerr [id number] 10-23-1955  Preliminary diagnosis  Stroke with possible embolic source  Postprocedural diagnosis  Normal LV systolic function no apparent source of embolus  Time out A timeout was performed by the nursing staff and physicians specifically identifying the procedure performed, identification of the patient, the type of sedation, all allergies and medications, all pertinent medical history, and presedation assessment of nasopharynx. The patient and or family understand the risks of the procedure including the rare risks of death, stroke, heart attack, esophogeal perforation, sore throat, and reaction to medications given.  Moderate sedation During this procedure the patient has received Versed 1 milligrams and fentanyl 25 micrograms to achieve appropriate moderate sedation.  The patient had continued monitoring of heart rate, oxygenation, blood pressure, respiratory rate, and extent of signs of sedation throughout the entire procedure.  The patient received this moderate sedation over a period of 17 minutes.  Both the nursing staff and I were present during the procedure when the patient had moderate sedation for 100% of the time.  Treatment considerations  No further cardiac intervention due to no apparent cardiac source of embolus  For further details of transesophageal echocardiogram please refer to final report.  Signed,  Lamar Blinks M.D. [facility] 10/08/2020 1:24 PM

## 2020-10-08 NOTE — Progress Notes (Addendum)
Occupational Therapy Treatment Patient Details Name: Raymond Kerr MRN: [id number] DOB: 10/15/1955 Today's Date: 10/08/2020    History of present illness 65 y.o. African-American male with medical history significant for type II diabetes mellitus, GERD and hypertension, who presented to the emergency room with acute onset of diplopia and generalized weakness that started 8 hours prior to presentation.  The patient has been having expressive aphasia. 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 No hemorrhage or mass effect."   OT comments  Pt seen for OT treatment on this date. Upon arrival to room, pt awake in bed. Despite male purewick donned and suction appearing to be functioning properly, pt soiled in urine. Pt agreeable to OT tx and assistance from OT. Pt was able to perform supine>sit transfer with MOD A. Once seated EOB, pt's visitor [name]) entered room and verbalized frustration with pt being soiled in urine. This Thereasa Parkin attempted to educate [name] on role of OT in acute care setting and therapeutic benefits of pt engaging in self-care tasks following condom catheter malfunction, however visitor becoming agitated and leaving room to speak with RN.   While sitting EOB, pt engaged in UB dressing, requiring MOD A to don/doff hospital gown and fasten x6 snap buttons and MOD verbal cues to implement hemi-dressing technique. With bed slightly elevated, pt required MOD Ax2 for sit>stand transfer and MOD Ax2 to take small shuffled steps towards recliner with RW. While seated, pt was able to clean anterior aspects of LE following set-up assistance, however required MAX A+2 to wash posterior aspects of LE during sit>stand LB bathing. Pt was able to perform additional sit<>stand transfer from recliner and stand for 3 mins with MOD A+2. Compared to previous sessions, pt appears to require significantly more physical assistance for OOB mobility this  date; RN and MD informed.  At end of session, pt's visitor [name]) continuing to express frustrations however thanking OT for support and requesting pt be cleaned by staff prior to future therapy sessions. OT to plan to coordinate with staff in future sessions and plan to continue educating [name] on role of OT in acute care setting. Pt continues to benefit from skilled OT services to maximize return to PLOF and minimize risk of future falls, injury, caregiver burden, and readmission. Will continue to follow POC. Discharge recommendation remains appropriate.     Follow Up Recommendations  CIR    Equipment Recommendations  Other (comment);3 in 1 bedside commode       Precautions / Restrictions Precautions Precautions: Fall Restrictions Weight Bearing Restrictions: No       Mobility Bed Mobility Overal bed mobility: Needs Assistance Bed Mobility: Supine to Sit     Supine to sit: Mod assist     General bed mobility comments: MOD A for trunk support    Transfers Overall transfer level: Needs assistance Equipment used: Rolling walker (2 wheeled) Transfers: Sit to/from [employer] Sit to Stand: Mod assist;+2 physical assistance;From elevated surface Stand pivot transfers: Mod assist;+2 physical assistance       General transfer comment: x1 bout with bed slightly elevated and x1 bout from recliner    Balance Overall balance assessment: Needs assistance Sitting-balance support: No upper extremity supported;Feet supported Sitting balance-Leahy Scale: Good Sitting balance - Comments: good static balance at EOB during UB dressing   Standing balance support: Bilateral upper extremity supported;During functional activity Standing balance-Leahy Scale: Poor Standing balance comment: Requires MOD A+2 to perform stand pivot transfer d/t  R lateral lean                           ADL either performed or assessed with clinical judgement   ADL Overall ADL's  : Needs assistance/impaired     Grooming: Wash/dry face;Wash/dry hands;Set up;Sitting       Lower Body Bathing: Maximal assistance;+2 for physical assistance;Sit to/from stand Lower Body Bathing Details (indicate cue type and reason): MOD A for maintaining standing balance d/t R lateral lean and MAX A for LB bathing. Upper Body Dressing : Moderate assistance;Sitting Upper Body Dressing Details (indicate cue type and reason): to don/doff hospital gown via hemi-dressing technique. Pt required MOD verbal cues for implementing technique and using R UE as functional assist to fasten x6 snap buttons                 Functional mobility during ADLs: Moderate assistance;+2 for physical assistance        Cognition Arousal/Alertness: Awake/alert Behavior During Therapy: Flat affect Overall Cognitive Status: Impaired/Different from baseline                                 General Comments: Pt alert and oriented to self and place, disoriented to situation. Requires increased processing time + MOD multi-modal cues for using RUE during ADLs                   Pertinent Vitals/ Pain       Pain Assessment: No/denies pain         Frequency  Min 3X/week        Progress Toward Goals  OT Goals(current goals can now be found in the care plan section)  Progress towards OT goals: Progressing toward goals  Acute Rehab OT Goals Patient Stated Goal: To feel better OT Goal Formulation: With patient/family Time For Goal Achievement: 10/20/20 Potential to Achieve Goals: Good  Plan Discharge plan remains appropriate;Frequency remains appropriate       AM-PAC OT "6 Clicks" Daily Activity     Outcome Measure   Help from another person eating meals?: None Help from another person taking care of personal grooming?: A Little Help from another person toileting, which includes using toliet, bedpan, or urinal?: A Lot Help from another person bathing (including washing,  rinsing, drying)?: A Lot Help from another person to put on and taking off regular upper body clothing?: A Lot Help from another person to put on and taking off regular lower body clothing?: A Lot 6 Click Score: 15    End of Session Equipment Utilized During Treatment: Gait belt;Rolling walker  OT Visit Diagnosis: Other abnormalities of gait and mobility (R26.89);Hemiplegia and hemiparesis Hemiplegia - Right/Left: Right Hemiplegia - dominant/non-dominant: Dominant Hemiplegia - caused by: Cerebral infarction   Activity Tolerance Patient tolerated treatment well   Patient Left in bed;with call bell/phone within reach;with bed alarm set   Nurse Communication Mobility status        Time: 0962-8366 OT Time Calculation (min): 40 min  Charges: OT General Charges $OT Visit: 1 Visit OT Treatments $Self Care/Home Management : 38-52 mins   Matthew Folks, OTR/L [facility] [phone]

## 2020-10-08 NOTE — Progress Notes (Signed)
Inpatient Rehab Admissions Coordinator:   Spoke to patient's significant other, [name], over the phone while she was in the patient's room.  They are very interested in CIR.  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  I tried to explain goals/expectations to her; she was hyper verbose and I would like to repeat our conversation tomorrow to ensure understanding.  I explained that average length of stay is about 2 weeks, and his stay would not likely exceed 3-4 weeks.  I also explained several times that our expectation is supervision to min assist level and that he would not be able to live by himself, certainly not in a truck.  I also explained to her that he would not be able to discharge from our facility to a SNF with no payor source or with commercial/medicare advantage insurance.  She states she believes he does has coverage and is looking for his cards.  I let her know that I could not offer him a bed unless I knew exactly where he was going after CIR (need a permanent residence whether that is with her, or in an apartment), that he has 24/7 assist set up for discharge, and what his insurance is (in case I need prior authorization).  I will follow up with her tomorrow.  I also updated Robbie Lis.    Estill Dooms, PT, DPT Admissions Coordinator [phone] 10/08/20  12:12 PM

## 2020-10-08 NOTE — Progress Notes (Signed)
[facility] Cardiology [facility] Encounter Note  Patient: Raymond Kerr / Admit Date: 10/06/2020 / Date of Encounter: 10/08/2020, 1:19 PM   Subjective: 8/16.  Patient stated continued significant elevated blood pressure despite additional medication management.  We will need to adjust medication management slowly for significant hypertension.  Additionally the patient has not had any telemetry changes for concerns of atrial fibrillation.  He has had some slight improvements from the neurologic standpoint but still has left-sided facial weakness  8/17.  Patient has not had a significant improvement in his stroke and neurologic abnormalities.  The patient is still having difficulty with speech and facial drooping.  The patient has been placed on appropriate medication management further risk reduction in stroke from the cardiovascular standpoint.  No evidence of atrial fibrillation at this time although concerns of the possibility of embolic phenomenon causing his stroke.  Transesophageal echocardiogram showing normal LV systolic function with ejection fraction of 55 to 60% with no evidence of significant valvular heart disease, no evidence of patent foramen ovale and/or ASD and or atrial septal defect, no evidence of atrial thrombus, mild to moderate aortic and aortic arch atherosclerosis  Echocardiogram with mild segmental LV systolic dysfunction in the septal region with correlating with EKG and ejection fraction of 45%.  EKG shows septal infarct age undetermined.  No evidence of significant valvular heart disease  Review of Systems: Positive for: Facial drooping Negative for: Vision change, hearing change, syncope, dizziness, nausea, vomiting,diarrhea, bloody stool, stomach pain, cough, congestion, diaphoresis, urinary frequency, urinary pain,skin lesions, skin rashes Others previously listed  Objective: Telemetry: Normal sinus rhythm Physical Exam: Blood pressure 136/69, pulse 84,  temperature 98.4 F (36.9 C), temperature source Oral, resp. rate 18, height 6' (1.829 m), weight 120.4 kg, SpO2 100 %. Body mass index is 36 kg/m. General: Well developed, well nourished, in no acute distress. Head: Normocephalic, atraumatic, sclera non-icteric, no xanthomas, nares are without discharge. Neck: No apparent masses Lungs: Normal respirations with no wheezes, no rhonchi, no rales , no crackles   Heart: Regular rate and rhythm, normal S1 S2, no murmur, no rub, no gallop, PMI is normal size and placement, carotid upstroke normal without bruit, jugular venous pressure normal Abdomen: Soft, non-tender, non-distended with normoactive bowel sounds. No hepatosplenomegaly. Abdominal aorta is normal size without bruit Extremities: No edema, no clubbing, no cyanosis, no ulcers,  Peripheral: 2+ radial, 2+ femoral, 2+ dorsal pedal pulses Neuro: Alert and oriented. Moves all extremities spontaneously.  With neurologic abnormalities Psych:  Responds to questions appropriately with a normal affect.   Intake/Output Summary (Last 24 hours) at 10/08/2020 1319 Last data filed at 10/08/2020 0500 Gross per 24 hour  Intake 240 ml  Output 1000 ml  Net -760 ml     Inpatient Medications:   [MAR Hold]  stroke: mapping our early stages of recovery book   Does not apply Once   [facility] Hold] amLODipine  5 mg Oral Daily   [MAR Hold] aspirin EC  81 mg Oral Daily   [MAR Hold] atorvastatin  80 mg Oral Daily   butamben-tetracaine-benzocaine       [MAR Hold] clopidogrel  75 mg Oral Daily   [MAR Hold] enoxaparin (LOVENOX) injection  0.5 mg/kg Subcutaneous Q24H   fentaNYL       [MAR Hold] glimepiride  4 mg Oral Q breakfast   [MAR Hold] insulin aspart  0-15 Units Subcutaneous Q4H   lidocaine       [MAR Hold] losartan  100 mg Oral Daily  midazolam       [MAR Hold] pioglitazone  30 mg Oral Daily   Infusions:   sodium chloride      Labs: Recent Labs    10/06/20 0818 10/07/20 0444  NA 136 136  K  3.2* 3.8  CL 101 105  CO2 26 27  GLUCOSE 167* 194*  BUN 13 14  CREATININE 1.09 1.10  CALCIUM 8.5* 8.6*  MG  --  1.7    Recent Labs    10/05/20 2002  AST 27  ALT 14  ALKPHOS 70  BILITOT 0.6  PROT 7.4  ALBUMIN 3.3*    Recent Labs    10/05/20 2002 10/06/20 0818  WBC 5.3 6.1  NEUTROABS 2.9  --   HGB 13.2 10.4*  HCT 40.5 30.9*  MCV 89.4 90.6  PLT 277 265    No results for input(s): CKTOTAL, CKMB, TROPONINI in the last 72 hours. Invalid input(s): POCBNP Recent Labs    10/06/20 0818  HGBA1C 10.1*      Weights: Filed Weights   10/05/20 1950 10/06/20 2045  Weight: 118.8 kg 120.4 kg     Radiology/Studies:  DG Chest 2 View  Result Date: 10/06/2020 CLINICAL DATA:  Slurred speech and weakness EXAM: CHEST - 2 VIEW COMPARISON:  None. FINDINGS: The heart size and mediastinal contours are within normal limits. Both lungs are clear. The visualized skeletal structures are unremarkable. IMPRESSION: No active cardiopulmonary disease. Electronically Signed   By: Alcide Clever M.D.   On: 10/06/2020 01:57   CT HEAD WO CONTRAST  Result Date: 10/05/2020 CLINICAL DATA:  Neuro deficit, acute, stroke suspected. Slurred speech and double vision for the past 8 hours. EXAM: CT HEAD WITHOUT CONTRAST TECHNIQUE: Contiguous axial images were obtained from the base of the skull through the vertex without intravenous contrast. COMPARISON:  None. FINDINGS: Brain: Chronic small vessel ischemic changes within the bilateral periventricular white matter regions. Small chronic-appearing infarct within the LEFT cerebellum. Additional low-density areas within the LEFT thalamus and RIGHT frontal lobe, compatible with subacute to chronic infarcts, favor chronic. No parenchymal mass or hemorrhage. No mass effect, midline shift or herniation seen. Vascular: Chronic calcified atherosclerotic changes of the large vessels at the skull base. No unexpected hyperdense vessel. Skull: Normal. Negative for fracture or  focal lesion. Sinuses/Orbits: No acute finding. Other: None. IMPRESSION: 1. Low-density areas within the LEFT thalamus and RIGHT frontal lobe, compatible with subacute to chronic infarcts, favor chronic. 2. Additional chronic ischemic changes within the white matter regions and LEFT cerebellum. 3. No intracranial hemorrhage. No significant mass effect, midline shift or herniation. Electronically Signed   By: Bary Richard M.D.   On: 10/05/2020 20:21   MR ANGIO HEAD WO CONTRAST  Result Date: 10/06/2020 CLINICAL DATA:  Seizure, slurred speech and double vision. EXAM: MRI HEAD WITHOUT CONTRAST MRA HEAD WITHOUT CONTRAST MRA NECK WITHOUT CONTRAST TECHNIQUE: Multiplanar, multiecho pulse sequences of the brain and surrounding structures were obtained without intravenous contrast. Angiographic images of the Circle of Willis were obtained using MRA technique without intravenous contrast. Angiographic images of the neck were obtained using MRA technique without intravenous contrast. Carotid stenosis measurements (when applicable) are obtained utilizing NASCET criteria, using the distal internal carotid diameter as the denominator. COMPARISON:  None. FINDINGS: MRI HEAD FINDINGS Brain: Small acute infarcts of the left paramedian frontal lobe and ventral medial left thalamus. Multiple smaller foci of acute ischemia within the left anterior cerebral artery distribution. No acute hemorrhage. No chronic microhemorrhage. Old left cerebellar and right frontal  infarcts. There is multifocal hyperintense T2-weighted signal within the white matter. Parenchymal volume and CSF spaces are normal. The midline structures are normal. Vascular: Major flow voids are preserved. Skull and upper cervical spine: Normal calvarium and skull base. Visualized upper cervical spine and soft tissues are normal. Sinuses/Orbits:No paranasal sinus fluid levels or advanced mucosal thickening. No mastoid or middle ear effusion. Normal orbits. MRA HEAD  FINDINGS POSTERIOR CIRCULATION: --Vertebral arteries: Normal --Inferior cerebellar arteries: Normal. --Basilar artery: Normal. --Superior cerebellar arteries: Normal. --Posterior cerebral arteries: Normal. ANTERIOR CIRCULATION: --Intracranial internal carotid arteries: Normal. --Anterior cerebral arteries (ACA): There is occlusion of a short segment of the left ACA A2 segment. There is moderate narrowing of the right ACA proximal pericallosal portion. --Middle cerebral arteries (MCA): Normal. ANATOMIC VARIANTS: None MRA NECK FINDINGS Motion degraded time-of-flight imaging of the carotid and vertebral arteries is unremarkable. There is no visible stenosis or occlusion. IMPRESSION: 1. Small acute infarcts of the left paramedian frontal lobe and ventromedial left thalamus. No hemorrhage or mass effect. 2. 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 3. Moderate narrowing of the right ACA proximal A3 segment. 4. Motion degraded time-of-flight MRA of the neck without visible abnormality. Electronically Signed   By: Deatra RobinsonKevin  Herman M.D.   On: 10/06/2020 03:37   MR ANGIO NECK WO CONTRAST  Result Date: 10/06/2020 CLINICAL DATA:  Seizure, slurred speech and double vision. EXAM: MRI HEAD WITHOUT CONTRAST MRA HEAD WITHOUT CONTRAST MRA NECK WITHOUT CONTRAST TECHNIQUE: Multiplanar, multiecho pulse sequences of the brain and surrounding structures were obtained without intravenous contrast. Angiographic images of the Circle of Willis were obtained using MRA technique without intravenous contrast. Angiographic images of the neck were obtained using MRA technique without intravenous contrast. Carotid stenosis measurements (when applicable) are obtained utilizing NASCET criteria, using the distal internal carotid diameter as the denominator. COMPARISON:  None. FINDINGS: MRI HEAD FINDINGS Brain: Small acute infarcts of the left paramedian frontal lobe and ventral medial left thalamus. Multiple smaller foci of acute ischemia  within the left anterior cerebral artery distribution. No acute hemorrhage. No chronic microhemorrhage. 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 There is multifocal hyperintense T2-weighted signal within the white matter. Parenchymal volume and CSF spaces are normal. The midline structures are normal. Vascular: Major flow voids are preserved. Skull and upper cervical spine: Normal calvarium and skull base. Visualized upper cervical spine and soft tissues are normal. Sinuses/Orbits:No paranasal sinus fluid levels or advanced mucosal thickening. No mastoid or middle ear effusion. Normal orbits. MRA HEAD FINDINGS POSTERIOR CIRCULATION: --Vertebral arteries: Normal --Inferior cerebellar arteries: Normal. --Basilar artery: Normal. --Superior cerebellar arteries: Normal. --Posterior cerebral arteries: Normal. ANTERIOR CIRCULATION: --Intracranial internal carotid arteries: Normal. --Anterior cerebral arteries (ACA): There is occlusion of a short segment of the left ACA A2 segment. There is moderate narrowing of the right ACA proximal pericallosal portion. --Middle cerebral arteries (MCA): Normal. ANATOMIC VARIANTS: None MRA NECK FINDINGS Motion degraded time-of-flight imaging of the carotid and vertebral arteries is unremarkable. There is no visible stenosis or occlusion. IMPRESSION: 1. Small acute infarcts of the left paramedian frontal lobe and ventromedial left thalamus. No hemorrhage or mass effect. 2. 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 3. Moderate narrowing of the right ACA proximal A3 segment. 4. Motion degraded time-of-flight MRA of the neck without visible abnormality. Electronically Signed   By: Deatra RobinsonKevin  Herman M.D.   On: 10/06/2020 03:37   MR BRAIN WO CONTRAST  Result Date: 10/06/2020 CLINICAL DATA:  Seizure, slurred speech and double vision.  EXAM: MRI HEAD WITHOUT CONTRAST MRA HEAD WITHOUT CONTRAST MRA NECK WITHOUT CONTRAST TECHNIQUE: Multiplanar, multiecho pulse sequences of the brain  and surrounding structures were obtained without intravenous contrast. Angiographic images of the Circle of Willis were obtained using MRA technique without intravenous contrast. Angiographic images of the neck were obtained using MRA technique without intravenous contrast. Carotid stenosis measurements (when applicable) are obtained utilizing NASCET criteria, using the distal internal carotid diameter as the denominator. COMPARISON:  None. FINDINGS: MRI HEAD FINDINGS Brain: Small acute infarcts of the left paramedian frontal lobe and ventral medial left thalamus. Multiple smaller foci of acute ischemia within the left anterior cerebral artery distribution. No acute hemorrhage. No chronic microhemorrhage. 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 There is multifocal hyperintense T2-weighted signal within the white matter. Parenchymal volume and CSF spaces are normal. The midline structures are normal. Vascular: Major flow voids are preserved. Skull and upper cervical spine: Normal calvarium and skull base. Visualized upper cervical spine and soft tissues are normal. Sinuses/Orbits:No paranasal sinus fluid levels or advanced mucosal thickening. No mastoid or middle ear effusion. Normal orbits. MRA HEAD FINDINGS POSTERIOR CIRCULATION: --Vertebral arteries: Normal --Inferior cerebellar arteries: Normal. --Basilar artery: Normal. --Superior cerebellar arteries: Normal. --Posterior cerebral arteries: Normal. ANTERIOR CIRCULATION: --Intracranial internal carotid arteries: Normal. --Anterior cerebral arteries (ACA): There is occlusion of a short segment of the left ACA A2 segment. There is moderate narrowing of the right ACA proximal pericallosal portion. --Middle cerebral arteries (MCA): Normal. ANATOMIC VARIANTS: None MRA NECK FINDINGS Motion degraded time-of-flight imaging of the carotid and vertebral arteries is unremarkable. There is no visible stenosis or occlusion. IMPRESSION: 1. Small acute infarcts of the  left paramedian frontal lobe and ventromedial left thalamus. No hemorrhage or mass effect. 2. 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 3. Moderate narrowing of the right ACA proximal A3 segment. 4. Motion degraded time-of-flight MRA of the neck without visible abnormality. Electronically Signed   By: Deatra Robinson M.D.   On: 10/06/2020 03:37   ECHOCARDIOGRAM COMPLETE  Result Date: 10/06/2020    ECHOCARDIOGRAM REPORT   Patient Name:   BIRDIE BEVERIDGE Date of Exam: 10/06/2020 Medical Rec #:  [id number]     Height:       72.0 in Accession #:    [id number]    Weight:       262.0 lb Date of Birth:  [birth year]    BSA:          2.390 m Patient Age:    64 years      BP:           119/78 mmHg Patient Gender: M             HR:           71 bpm. Exam Location:  [facility] Procedure: 2D Echo, Color Doppler and Cardiac Doppler Indications:     TIA G45.9  History:         Patient has no prior history of Echocardiogram examinations.                  Risk Factors:Diabetes and Hypertension. GERD.  Sonographer:     Cristela Blue Referring Phys:  [id number] JAN A MANSY Diagnosing Phys: Arnoldo Hooker MD  Sonographer Comments: Technically challenging study due to limited acoustic windows. The only view obtainable was parasternal. IMPRESSIONS  1. Left ventricular ejection fraction, by estimation, is 40 to 45%. The left ventricle has mildly decreased function. The left ventricle demonstrates regional wall motion  abnormalities (see scoring diagram/findings for description). Left ventricular diastolic parameters were normal.  2. Right ventricular systolic function is normal. The right ventricular size is normal.  3. The mitral valve is normal in structure. Mild mitral valve regurgitation.  4. The aortic valve is normal in structure. Aortic valve regurgitation is not visualized. FINDINGS  Left Ventricle: Left ventricular ejection fraction, by estimation, is 40 to 45%. The left ventricle has mildly decreased function. The left ventricle  demonstrates regional wall motion abnormalities. Moderate hypokinesis of the left ventricular, mid-apical anteroseptal wall. The left ventricular internal cavity size was normal in size. There is no left ventricular hypertrophy. Left ventricular diastolic parameters were normal. Right Ventricle: The right ventricular size is normal. No increase in right ventricular wall thickness. Right ventricular systolic function is normal. Left Atrium: Left atrial size was normal in size. Right Atrium: Right atrial size was normal in size. Pericardium: There is no evidence of pericardial effusion. Mitral Valve: The mitral valve is normal in structure. Mild mitral valve regurgitation. Tricuspid Valve: The tricuspid valve is normal in structure. Tricuspid valve regurgitation is mild. Aortic Valve: The aortic valve is normal in structure. Aortic valve regurgitation is not visualized. Pulmonic Valve: The pulmonic valve was normal in structure. Pulmonic valve regurgitation is not visualized. Aorta: The aortic root and ascending aorta are structurally normal, with no evidence of dilitation. IAS/Shunts: The interatrial septum was not assessed.  LEFT VENTRICLE PLAX 2D LVIDd:         4.30 cm LVIDs:         3.40 cm LV PW:         1.10 cm LV IVS:        1.20 cm LVOT diam:     2.00 cm LVOT Area:     3.14 cm  LEFT ATRIUM         Index LA diam:    2.90 cm 1.21 cm/m   AORTA Ao Root diam: 3.60 cm  SHUNTS Systemic Diam: 2.00 cm Arnoldo Hooker MD Electronically signed by Arnoldo Hooker MD Signature Date/Time: 10/06/2020/12:44:47 PM    Final      Assessment and Recommendation  65 y.o. male with known significant diabetes hypertension hyperlipidemia abnormal EKG with acute stroke most consistent with ischemic and/or vascular atherosclerotic stroke rather than embolic without evidence of congestive heart failure or myocardial infarction at this time.  Transesophageal echocardiogram shows no evidence of primary source of stroke with no further  evidence of atrial fibrillation either 1.  Dual antiplatelet therapy including Plavix and aspirin for acute stroke 2.  High intensity cholesterol therapy 3.  Hypertension control with current angiotensin receptor blocker and will consider introduction of beta-blocker and/or calcium channel blocker for improvements of hypertension control which have been successful at this time 4.  No further cardiac diagnostics at this time due to no evidence of heart failure or myocardial infarction and transesophageal echocardiogram showing no evidence of primary source of embolus 5.  Begin physical rehabilitation 6.  Continue above with rehabilitation and call if further questions.  Otherwise will follow-up as outpatient in 2 weeks Signed, Arnoldo Hooker M.D. FACC

## 2020-10-09 ENCOUNTER — Encounter: Payer: Self-pay | Admitting: Internal Medicine

## 2020-10-09 ENCOUNTER — Inpatient Hospital Stay: Payer: Medicaid Other

## 2020-10-09 DIAGNOSIS — I639 Cerebral infarction, unspecified: Secondary | ICD-10-CM

## 2020-10-09 DIAGNOSIS — E118 Type 2 diabetes mellitus with unspecified complications: Secondary | ICD-10-CM

## 2020-10-09 LAB — CBC
HCT: 38.4 % — ABNORMAL LOW (ref 39.0–52.0)
Hemoglobin: 12.9 g/dL — ABNORMAL LOW (ref 13.0–17.0)
MCH: 29.9 pg (ref 26.0–34.0)
MCHC: 33.6 g/dL (ref 30.0–36.0)
MCV: 89.1 fL (ref 80.0–100.0)
Platelets: 271 10*3/uL (ref 150–400)
RBC: 4.31 MIL/uL (ref 4.22–5.81)
RDW: 14.4 % (ref 11.5–15.5)
WBC: 8.2 10*3/uL (ref 4.0–10.5)
nRBC: 0 % (ref 0.0–0.2)

## 2020-10-09 LAB — GLUCOSE, CAPILLARY
Glucose-Capillary: 165 mg/dL — ABNORMAL HIGH (ref 70–99)
Glucose-Capillary: 179 mg/dL — ABNORMAL HIGH (ref 70–99)
Glucose-Capillary: 126 mg/dL — ABNORMAL HIGH (ref 70–99)
Glucose-Capillary: 144 mg/dL — ABNORMAL HIGH (ref 70–99)
Glucose-Capillary: 165 mg/dL — ABNORMAL HIGH (ref 70–99)
Glucose-Capillary: 156 mg/dL — ABNORMAL HIGH (ref 70–99)

## 2020-10-09 LAB — BASIC METABOLIC PANEL
Anion gap: 4 — ABNORMAL LOW (ref 5–15)
BUN: 11 mg/dL (ref 8–23)
CO2: 24 mmol/L (ref 22–32)
Calcium: 8.9 mg/dL (ref 8.9–10.3)
Chloride: 104 mmol/L (ref 98–111)
Creatinine, Ser: 1.06 mg/dL (ref 0.61–1.24)
GFR, Estimated: >60 mL/min (ref >60)
Glucose, Bld: 108 mg/dL — ABNORMAL HIGH (ref 70–99)
Potassium: 3.7 mmol/L (ref 3.5–5.1)
Sodium: 132 mmol/L — ABNORMAL LOW (ref 135–145)

## 2020-10-09 IMAGING — MR MR HEAD W/O CM
13 series · 48 of 48 positions shown · non-contrast
Comparison: [DATE]

CLINICAL DATA: Neuro deficit, acute, stroke suspected

EXAM:
MRI HEAD WITHOUT CONTRAST
TECHNIQUE: Multiplanar, multiecho pulse sequences of the brain and surrounding
structures were obtained without intravenous contrast.

[Series 5: ax dwi_tracew · axial · 3.0mm · 0.65mm/px · z∈[-31,+111]mm · 2 of 50 slices shown]
[im 1/50]
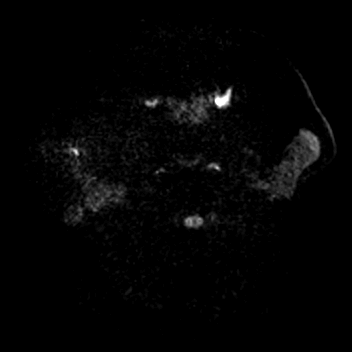
[im 50/50]
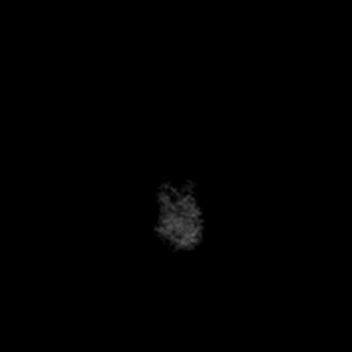

[Series 6: ax dwi_adc · axial · 3.0mm · 0.65mm/px · z∈[-31,+111]mm · 2 of 44 slices shown]
[im 1/44]
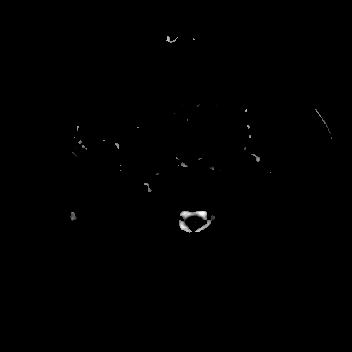
[im 44/44]
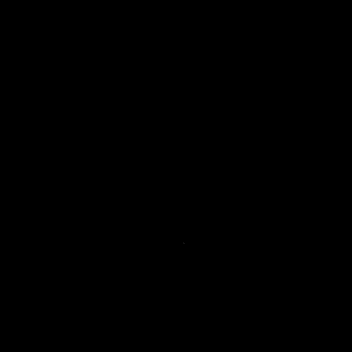

[Series 7: cor dwi_tracew · coronal · 5.0mm · 1.31mm/px · 3 of 38 slices shown]
[im 1/38]
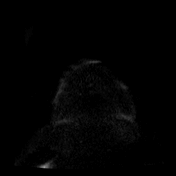
[im 19/38]
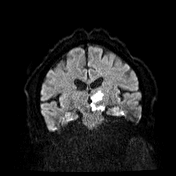
[im 38/38]
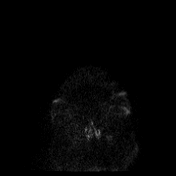

[Series 8: cor dwi_adc · coronal · 5.0mm · 1.31mm/px · 3 of 38 slices shown]
[im 1/38]
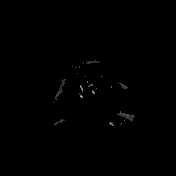
[im 19/38]
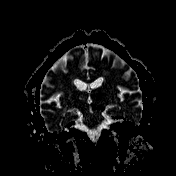
[im 38/38]
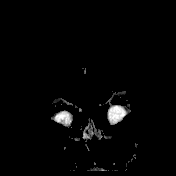

[Series 9: T1 · sagittal · 5.0mm · 0.94mm/px · 2 of 23 slices shown (1 of 2)]
[im 1/23]
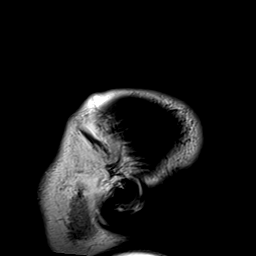
[im 23/23]
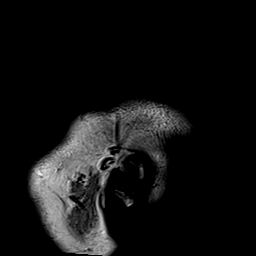

[Series 10: T2 · axial · 5.0mm · 0.45mm/px · z∈[-31,+108]mm · 2 of 27 slices shown (1 of 2)]
[im 1/27]
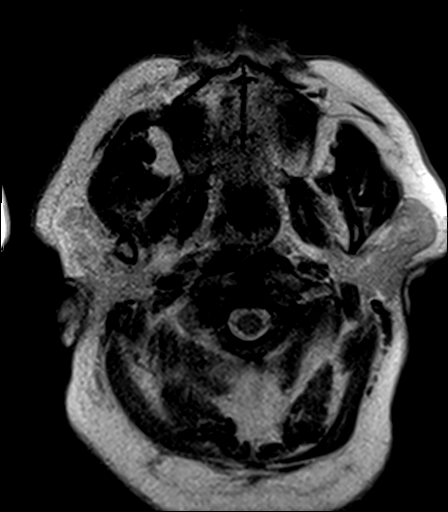
[im 27/27]
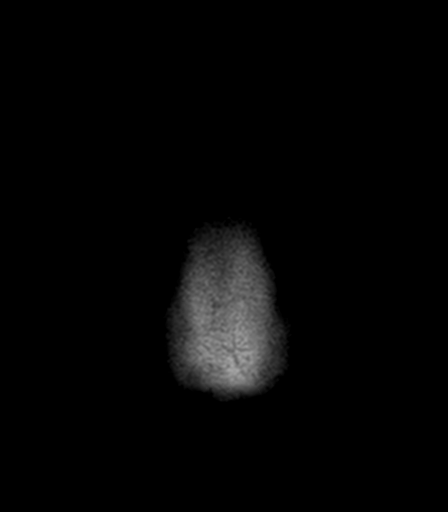

[Series 11: mag_images · axial · 3.0mm · 0.90mm/px · z∈[-42,+115]mm · 4 of 60 slices shown]
[im 1/60]
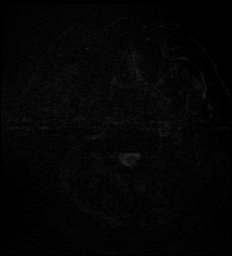
[im 20/60]
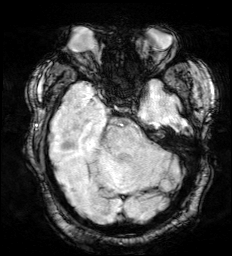
[im 40/60]
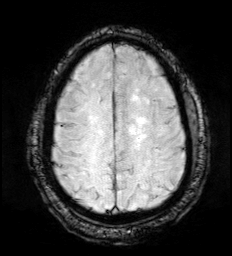
[im 60/60]
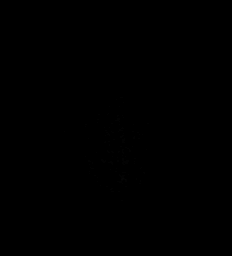

[Series 12: pha_images · axial · 3.0mm · 0.90mm/px · z∈[-42,+113]mm · 4 of 59 slices shown]
[im 1/59]
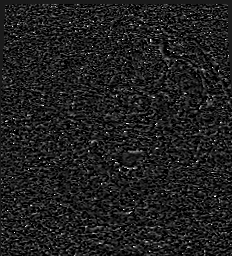
[im 20/59]
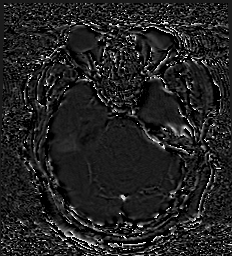
[im 39/59]
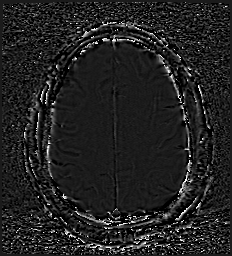
[im 59/59]
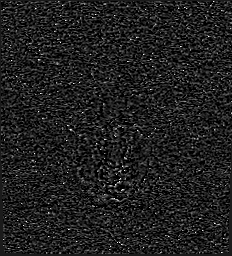

[Series 13: swi_images · axial · 3.0mm · 0.90mm/px · z∈[-42,+115]mm · 4 of 60 slices shown]
[im 1/60]
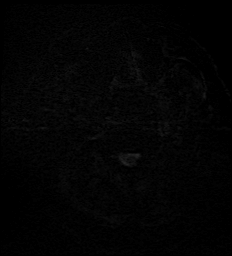
[im 20/60]
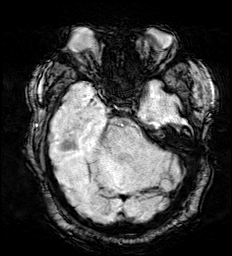
[im 40/60]
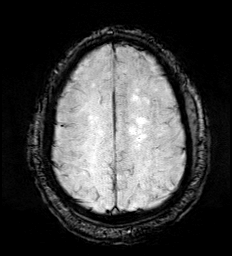
[im 60/60]
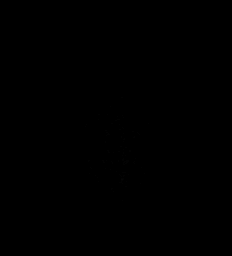

[Series 14: mip_images(sw) · axial · 24.0mm · 0.90mm/px · z∈[-33,+106]mm · 4 of 53 slices shown]
[im 1/53]
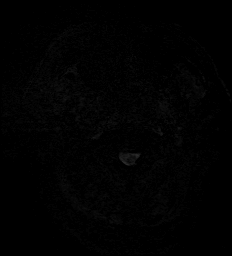
[im 18/53]
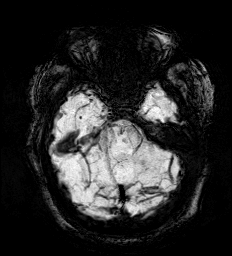
[im 35/53]
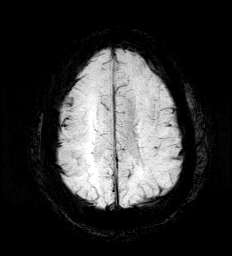
[im 53/53]
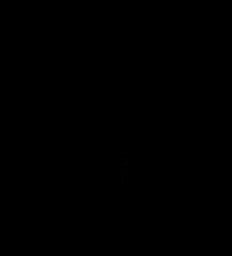

[Series 15: FLAIR · axial · 3.0mm · 0.53mm/px · z∈[-38,+106]mm · 4 of 55 slices shown]
[im 1/55]
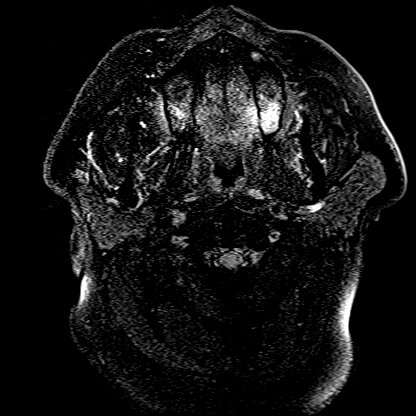
[im 19/55]
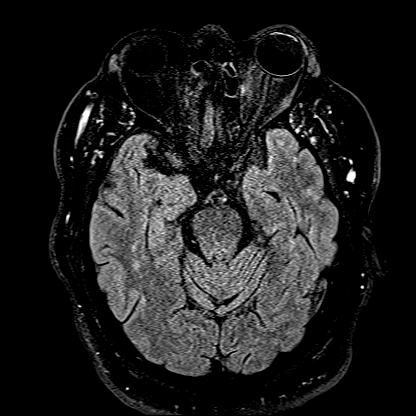
[im 37/55]
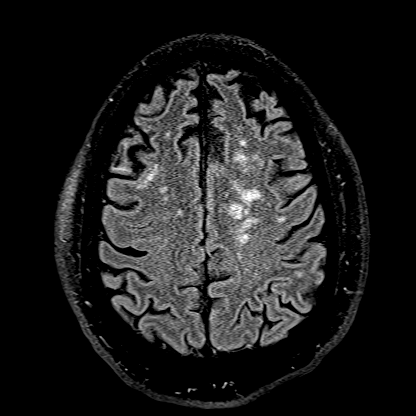
[im 55/55]
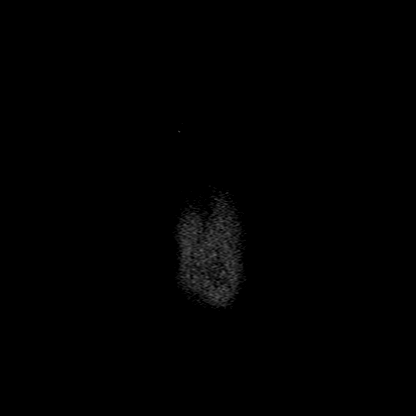

[Series 16: T1 · axial · 1.0mm · 0.98mm/px · z∈[-36,+122]mm · 12 of 176 slices shown (2 of 2)]
[im 1/176]
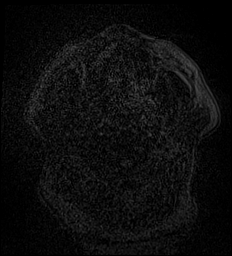
[im 16/176]
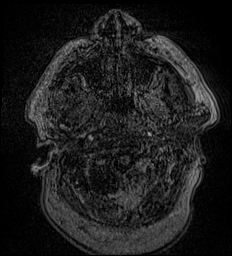
[im 32/176]
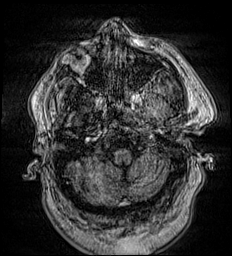
[im 48/176]
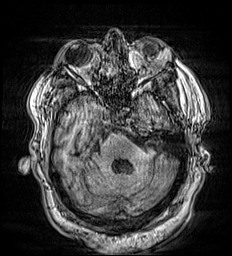
[im 64/176]
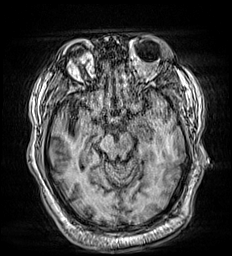
[im 80/176]
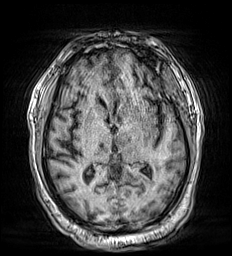
[im 96/176]
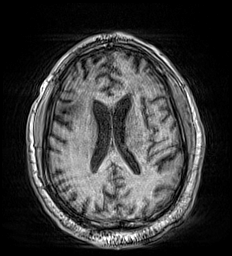
[im 112/176]
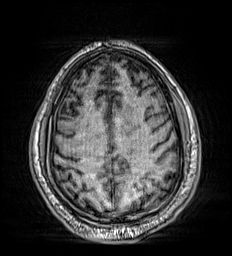
[im 128/176]
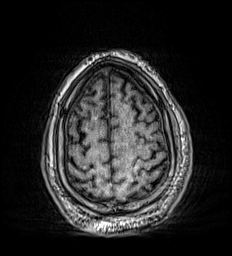
[im 144/176]
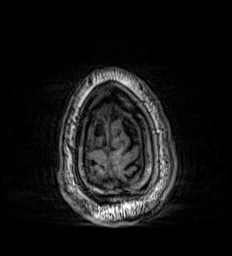
[im 160/176]
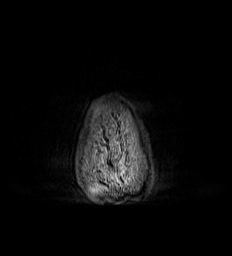
[im 176/176]
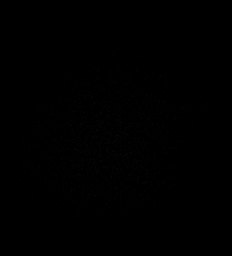

[Series 17: T2 · coronal · 5.0mm · 0.45mm/px · 2 of 31 slices shown (2 of 2)]
[im 1/31]
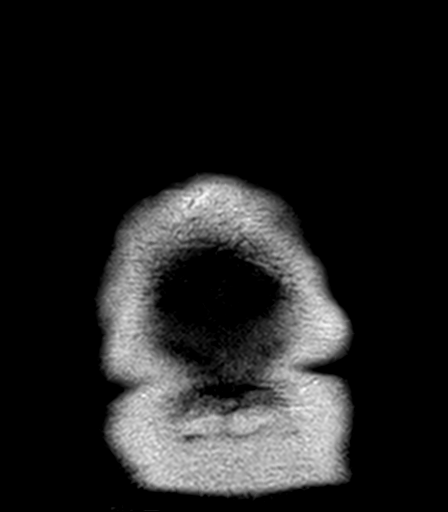
[im 31/31]
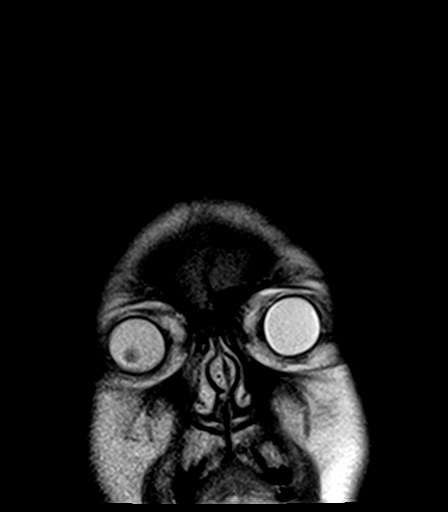

[48 of 48 positions shown; findings below may reference images not displayed]

FINDINGS: Brain: Multifocal acute ischemia in the left hemisphere and left
brainstem. Affected areas include the paramedian left frontal lobe
ventral medial left thalamus, left cerebral peduncle and left
midbrain. The left cerebral peduncle lesion is clearly new. Old left
cerebellar infarcts. Old right infarct. There is multifocal
hyperintense T2-weighted signal within the white matter. Generalized
volume loss without a clear lobar predilection. The midline
structures are normal.

Vascular: Major flow voids are preserved.

Skull and upper cervical spine: Normal calvarium and skull base.
Visualized upper cervical spine and soft tissues are normal.

Sinuses/Orbits:No paranasal sinus fluid levels or advanced mucosal
thickening. No mastoid or middle ear effusion. Normal orbits.
IMPRESSION: 1. Multifocal acute ischemia in the left hemisphere and left
brainstem, slightly worsened since [DATE].
[DATE]. No hemorrhage or mass effect.

## 2020-10-09 MED ORDER — SODIUM CHLORIDE 0.9 % IV BOLUS
1000.0000 mL | Freq: Once | INTRAVENOUS | Status: AC
  Administered 2020-10-09: 1000 mL via INTRAVENOUS

## 2020-10-09 MED ORDER — SODIUM CHLORIDE 0.9 % IV BOLUS
1000.0000 mL | INTRAVENOUS | Status: AC
  Administered 2020-10-09: 10:00:00 1000 mL via INTRAVENOUS

## 2020-10-09 MED ORDER — IOHEXOL 350 MG/ML SOLN
100.0000 mL | Freq: Once | INTRAVENOUS | Status: AC | PRN
  Administered 2020-10-09: 100 mL via INTRAVENOUS

## 2020-10-09 MED ORDER — MIDODRINE HCL 5 MG PO TABS
2.5000 mg | ORAL_TABLET | Freq: Three times a day (TID) | ORAL | Status: DC
  Administered 2020-10-09 (×2): 2.5 mg via ORAL
  Filled 2020-10-09 (×3): qty 1

## 2020-10-09 NOTE — Progress Notes (Signed)
Occupational Therapy Treatment Patient Details Name: Raymond Kerr MRN: [id number] DOB: 1955-05-04 Today's Date: 10/09/2020    History of present illness 65 y.o. African-American male with medical history significant for type II diabetes mellitus, GERD and hypertension, who presented to the emergency room with acute onset of diplopia and generalized weakness that started 8 hours prior to presentation.  The patient has been having expressive aphasia. 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 No hemorrhage or mass effect."   OT comments  Pt seen for OT treatment on this date. Upon arrival to room, pt seated upright in recliner. Pt and pt's visitor [name]) informing this Thereasa Parkin that pt needed to have BM and requesting assistance from OT to return to bed to use bedpan. With armrest of chair lowered, pt performed lateral/scoot transfer to bed with slide-board and MAX A+2. Pt required MAX A+2 for sit>supine transfer and MOD A (MIN A to roll R side, MAX to roll L side) for rolling in bed. Pt unable to have BM; RN informed.   With bed in chair position, pt engaged in UB grooming tasks and UB therapy exercises. Compared to yesterday, pt with significant less RUE movement/strength and unable to engage RUE in ADLs/exercises without HHA from therapist/pt's LUE. Pt provided with HEP consisting of AAROM of shoulder flexion elbow flexion/extension, and digit flexion; pt was able to perform 10-20 reps of each and supportive visitor [name]) at bedside verbalized understanding of HEP provided. Pt is making good progress toward goals and continues to benefit from skilled OT services to maximize return to PLOF and minimize risk of future falls, injury, caregiver burden, and readmission. Will continue to follow POC. Discharge recommendation remains appropriate.     Follow Up Recommendations  CIR    Equipment Recommendations  Other (comment);3 in 1 bedside  commode       Precautions / Restrictions Precautions Precautions: Fall Restrictions Weight Bearing Restrictions: No       Mobility Bed Mobility Overal bed mobility: Needs Assistance Bed Mobility: Sit to Supine;Rolling Rolling: Mod assist (MIN A to roll to R side, MAX A to roll to L side)   Supine to sit: Mod assist Sit to supine: Max assist;+2 for physical assistance        Transfers Overall transfer level: Needs assistance Equipment used: Sliding board Transfers: Lateral/Scoot Transfers Sit to Stand: Max assist;+2 physical assistance        Lateral/Scoot Transfers: Max assist;+2 physical assistance      Balance Overall balance assessment: Needs assistance Sitting-balance support: Feet supported;Bilateral upper extremity supported Sitting balance-Leahy Scale: Fair Sitting balance - Comments: Pt demonstrates R lateral trunk lean, able to correct with multi-modal cues Postural control: Right lateral lean   Standing balance-Leahy Scale: Poor Standing balance comment: Required Max A +2 for support secondary to R lateral trunk lean and RLE knee buckling                           ADL either performed or assessed with clinical judgement   ADL Overall ADL's : Needs assistance/impaired     Grooming: Wash/dry face;Minimal assistance;Sitting Grooming Details (indicate cue type and reason): With proximal joint support and washcloth placed in R hand, pt able bring RUE to wash face with washcloth with HHA from LUE                     Toileting- Clothing Manipulation and Hygiene:  Maximal assistance;Bed level Toileting - Clothing Manipulation Details (indicate cue type and reason): for posterior peri-care     Functional mobility during ADLs: Maximal assistance;+2 for physical assistance (lateral/scoot transfer with slideboard)        Cognition Arousal/Alertness: Lethargic Behavior During Therapy: Flat affect;WFL for tasks assessed/performed Overall  Cognitive Status: Impaired/Different from baseline Area of Impairment: Problem solving                             Problem Solving: Slow processing General Comments: Pt requires increased time for processing commands and requires multi-modal cues for mobilizing both RUE, RLE        Exercises General Exercises - Upper Extremity Shoulder Flexion: PROM;Right;10 reps;Supine Elbow Flexion: PROM;Right;Supine Elbow Extension: PROM;Right;Supine General Exercises - Lower Extremity Heel Slides: PROM;Right;Supine;10 reps Other Exercises Other Exercises: Pt provided with HEP consisting of AAROM of shoulder flexion (0-80 degrees), elbow flexion/extension, and digit flexion (yellow theraputty provided for strengthening digits in gravity-eliminate position). Pt able to perform 10-20 reps of each and caregiver at bedside verbalized understanding of HEP provided           Pertinent Vitals/ Pain       Pain Assessment: No/denies pain         Frequency  Min 3X/week        Progress Toward Goals  OT Goals(current goals can now be found in the care plan section)  Progress towards OT goals: Progressing toward goals  Acute Rehab OT Goals Patient Stated Goal: To feel better OT Goal Formulation: With patient/family Time For Goal Achievement: 10/20/20 Potential to Achieve Goals: Good  Plan Discharge plan remains appropriate;Frequency remains appropriate       AM-PAC OT "6 Clicks" Daily Activity     Outcome Measure   Help from another person eating meals?: None Help from another person taking care of personal grooming?: A Lot Help from another person toileting, which includes using toliet, bedpan, or urinal?: A Lot Help from another person bathing (including washing, rinsing, drying)?: A Lot Help from another person to put on and taking off regular upper body clothing?: A Lot Help from another person to put on and taking off regular lower body clothing?: A Lot 6 Click Score:  14    End of Session Equipment Utilized During Treatment: Gait belt;Other (comment) (slideboard)  OT Visit Diagnosis: Other abnormalities of gait and mobility (R26.89);Hemiplegia and hemiparesis Hemiplegia - Right/Left: Right Hemiplegia - dominant/non-dominant: Dominant Hemiplegia - caused by: Cerebral infarction   Activity Tolerance Patient tolerated treatment well   Patient Left in bed;with call bell/phone within reach;with bed alarm set;with family/visitor present   Nurse Communication Mobility status        Time: 4650-3546 OT Time Calculation (min): 39 min  Charges: OT General Charges $OT Visit: 1 Visit OT Treatments $Self Care/Home Management : 23-37 mins $Therapeutic Activity: 8-22 mins  Matthew Folks, OTR/L [facility] [phone]

## 2020-10-09 NOTE — Progress Notes (Signed)
CODE STROKE- PHARMACY COMMUNICATION   Time CODE STROKE called/page received: [id number]  Time response to CODE STROKE was made (in person or via phone): [phone]  Time Stroke Kit retrieved from [facility] (only if needed): NA  Name of Provider/Nurse contacted: Dr. Quinn Axe, Neurologist at bedside. Patient with known left frontal and thalamic strokes and not a tPA candidate.  Tawnya Crook, PharmD, BCPS Clinical Pharmacist 10/09/2020 9:01 AM

## 2020-10-09 NOTE — Evaluation (Signed)
Clinical/Bedside Swallow Evaluation Patient Details  Name: Raymond Kerr MRN: [id number] Date of Birth: [birth year]  Today's Date: 10/09/2020 Time: SLP Start Time (ACUTE ONLY): 1605 SLP Stop Time (ACUTE ONLY): 1700 SLP Time Calculation (min) (ACUTE ONLY): 55 min  Past Medical History:  Past Medical History:  Diagnosis Date   Diabetes mellitus without complication (HCC)    GERD (gastroesophageal reflux disease)    Hypertension    Past Surgical History:  Past Surgical History:  Procedure Laterality Date   TEE WITHOUT CARDIOVERSION N/A 10/08/2020   Procedure: TRANSESOPHAGEAL ECHOCARDIOGRAM (TEE);  Surgeon: Lamar Blinks, MD;  Location: [facility] ORS;  Service: Cardiovascular;  Laterality: N/A;   HPI:  Pt  is a 65 y.o. African-American male with medical history significant for type II diabetes mellitus, GERD and hypertension, who presented to the emergency room with acute onset of diplopia and generalized weakness that started 8 hours prior to presentation.  The patient has been having expressive dysphasia.  His fiance who accompanied him stated that his symptoms started around midnight on 8/13 with dysarthria and diplopia but he was more confused.  He was noted in the ER to have left facial droop and was unable to lift his left heel.  They went to [place] on Friday and he started having symptoms on Saturday night with double vision.  They went to bed at 12 midnight and at 6 AM she noted that he knocked food on the floor and was later starting to have slurred speech.  He was feeling generally weak and unable to ambulate however without unilateral focal muscle weakness or paresthesias.  No chest pain or dyspnea or palpitations.  No cough or wheezing.  No urinary or stool incontinence.  CT Angio of head/neck: "No acute intracranial hemorrhage. Evolving areas of recent  infarction are identified in the left thalamus and left ACA  territory. No definite acute infarct.     No new large vessel  occlusion. 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 Perfusion imaging demonstrates 4 mL of penumbra within the distal  left ACA territory.".  MRI: Small acute infarcts of the left paramedian frontal lobe and  ventromedial left thalamus. No hemorrhage or mass effect.   Assessment / Plan / Recommendation Clinical Impression  Pt appears to present w/ Min+ oropharyngeal phase dysphagia w/ added impact from Drowsy/lethargic State on his swallow function and his overall awareness/alertness. Oropharyngeal phase deficits noted w/ neuromuscular deficits noted(R oral weakness). Pt consumed po trials w/ No overt, clinical s/s of aspiration during po trials given support/Supervision and following aspiration precautions. Pt appears at risk for aspiration d/t the above but when following aspiration precautions, given Supervision and support, and using a modified diet, the risks are reduced.   During po trials, pt was fed ice chips, thin liquids, purees and soft solids w/ no immediate, overt coughing, decline in vocal quality, or change in respiratory presentation during/post trials. O2 sats remained 98%. Multiple swallows and min disorganized swallowing occurred x2 -- suspect impact from Drowsiness. Oral phase appeared min Slower during bolus management, mastication, and bolus propulsion for A-P transfer for swallowing. Given Time, oral clearing achieved w/ all trial consistencies. OM Exam revealed R labial and lingual unilateral weakness w/ decreased tone and lingual deviation noted; speech mildly Dysarthric in few words spoken. Pt required full feeding assistance and verbal/tactile cues to attend during tasks.    Recommend a mech soft diet w/  well-Cut/minced meats, moistened foods; Thin liquids VIA CUP if increased coughing noted w/ straw. Pinch straw to limit sips to Small sips. Recommend aspiration precautions, monitor  for oral clearing. NSG STAFF TO ASSESS AND FEED PT AT MEALS FOR SAFETY. Pills CRUSHED vs Whole in Puree for safer, easier swallowing. Education given on Pills in Puree; food consistencies and monitoring of oral clearing; aspiration precautions to NSG and posted. MD/NSG updated and agreed. WIll monitor for needs. SLP Visit Diagnosis: Dysphagia, oropharyngeal phase (R13.12) (w/Cognitive-communication deficits; Drowsy)    Aspiration Risk  Mild aspiration risk;Risk for inadequate nutrition/hydration    Diet Recommendation   mech soft diet w/ well-Cut/minced meats, moistened foods; Thin liquids VIA CUP if increased coughing noted w/ straw. Pinch straw to limit sips to Small sips. Recommend aspiration precautions, monitor for oral clearing. NSG STAFF TO ASSESS AND FEED PT AT MEALS FOR SAFETY.  Medication Administration: Crushed with puree    Other  Recommendations Recommended Consults:  (Dietician f/u) Oral Care Recommendations: Oral care BID;Oral care before and after PO;Staff/trained caregiver to provide oral care Other Recommendations:  (not at this time)   Follow up Recommendations Inpatient Rehab;Skilled Nursing facility (TBD)      Frequency and Duration min 2x/week  2 weeks       Prognosis Prognosis for Safe Diet Advancement: Fair Barriers to Reach Goals: Cognitive deficits;Language deficits;Time post onset;Severity of deficits;Behavior      Swallow Study   General Date of Onset: 10/05/20 HPI: Pt  is a 65 y.o. African-American male with medical history significant for type II diabetes mellitus, GERD and hypertension, who presented to the emergency room with acute onset of diplopia and generalized weakness that started 8 hours prior to presentation.  The patient has been having expressive dysphasia.  His fiance who accompanied him stated that his symptoms started around midnight on 8/13 with dysarthria and diplopia but he was more confused.  He was noted in the ER to have left facial  droop and was unable to lift his left heel.  They went to [place] on Friday and he started having symptoms on Saturday night with double vision.  They went to bed at 12 midnight and at 6 AM she noted that he knocked food on the floor and was later starting to have slurred speech.  He was feeling generally weak and unable to ambulate however without unilateral focal muscle weakness or paresthesias.  No chest pain or dyspnea or palpitations.  No cough or wheezing.  No urinary or stool incontinence.  CT Angio of head/neck: "No acute intracranial hemorrhage. Evolving areas of recent  infarction are identified in the left thalamus and left ACA  territory. No definite acute infarct.     No new large vessel occlusion. 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 Perfusion imaging demonstrates 4 mL of penumbra within the distal  left ACA territory.".  MRI: Small acute infarcts of the left paramedian frontal lobe and  ventromedial left thalamus. No hemorrhage or mass effect. Type of Study: Bedside Swallow Evaluation Previous Swallow Assessment: none Diet Prior to this Study: Regular;Thin liquids Temperature Spikes Noted: No (wbc 8.2) Respiratory Status: Nasal cannula (2L) History of Recent Intubation: No Behavior/Cognition: Cooperative;Pleasant mood;Distractible;Requires cueing;Lethargic/Drowsy Oral Cavity Assessment: Within Functional Limits Oral Care Completed by SLP: Yes (attempted) Oral Cavity - Dentition: Adequate natural dentition Vision:  (n/a) Self-Feeding Abilities: Total assist Patient Positioning: Upright in bed (needed positioniing) Baseline  Vocal Quality: Low vocal intensity (mumbled speech; Min+ Dysarthria) Volitional Cough: Cognitively unable to elicit Volitional Swallow: Unable to elicit    Oral/Motor/Sensory Function Overall Oral Motor/Sensory Function: Moderate impairment Facial ROM: Reduced  right;Suspected CN VII (facial) dysfunction Facial Symmetry: Abnormal symmetry right;Suspected CN VII (facial) dysfunction Facial Strength: Reduced right;Suspected CN VII (facial) dysfunction Facial Sensation: Reduced right;Suspected CN V (Trigeminal) dysfunction (slight) Lingual ROM: Reduced right;Suspected CN XII (hypoglossal) dysfunction Lingual Symmetry: Abnormal symmetry right;Suspected CN XII (hypoglossal) dysfunction Lingual Strength: Reduced;Suspected CN XII (hypoglossal) dysfunction Velum:  (CNT) Mandible: Within Functional Limits (appeared)   Ice Chips Ice chips: Within functional limits (grossly) Presentation: Spoon (fed; 3 trials) Other Comments: drowsy   Thin Liquid Thin Liquid: Impaired Presentation: Straw (fed; 10-11 trials) Oral Phase Impairments: Poor awareness of bolus (min) Oral Phase Functional Implications: Prolonged oral transit Pharyngeal  Phase Impairments: Suspected delayed Swallow;Multiple swallows (x2) Other Comments: drowsy    Nectar Thick Nectar Thick Liquid: Not tested   Honey Thick Honey Thick Liquid: Not tested   Puree Puree: Within functional limits Presentation: Spoon (fed; 10 trials)   Solid     Solid: Impaired Presentation: Spoon (fed; 8 trials) Oral Phase Impairments: Impaired mastication (slow) Oral Phase Functional Implications: Impaired mastication;Prolonged oral transit Pharyngeal Phase Impairments:  (none) Other Comments: drowsy       Jerilynn Som, MS, [facility] [phone] [facility] 10/09/2020,6:26 PM

## 2020-10-09 NOTE — Progress Notes (Signed)
0810-Patient's significant other arrived at bedside, and immediately began complaining that he was "incontinent and un-cared for." Significant other assured patient had just been assessed and was found to be dry; however, we would be bathing him shortly. Significant other replied, "well I know his pee thing isn't hooked up like it's supposed to be."  Again advised we would be doing a complete bed change and bathing him as soon as we had the adequate amount of staff available to safely do so.  0830- Entered room with hygiene and Social worker. Patient found to be completely dry with condom cath intact and draining to gravity drainage bag. Linens clean and dry. Patient bathed and full linen change performed. Male puriwick applied per significant other request, despite providing education that condom cath was likely more appropriate and would leak less given anatomy.

## 2020-10-09 NOTE — Progress Notes (Addendum)
Neurology Stroke Code Note  Patient ID: 65 yo gentleman with hx HTN, DM2, HL who presented with multiple neurologic sx and was found to have 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   Subjective: MD notified that patient's R sided weakness had worsened overnight. Last known to be at recent baseline at 0300 on RN assessment. I evaluated the patient and found him to have no movement against gravity RUE and RLE. Stroke code activated. CT head showed no expected evolution of known subacute infarcts without acute findings. No blood. Not a tPA candidate 2/2 recent strokes and LKW > 4.5 hrs. CTA H&N did not show new LVO (personal review and discussion with radiology). CTP showed 68mm penumbra. Patient was noted to be relatively hypotensive w/ SBP 108. Episode favored to be 2/2 symptomatic relative hypotension in the setting of multifocal intracranial stenosis. Losartan d/c'd and 1L NS bolus administered.  Exam: Vitals:   10/09/20 0749 10/09/20 0934  BP: 128/72 108/65  Pulse: 82 79  Resp: 16 17  Temp: 98.4 F (36.9 C) 98 F (36.7 C)  SpO2: 97% 98%   Physical Exam  IDP:OEUMP but drowsy, O x4. Follows multi-step commands.   HEENT: Atraumatic, normocephalic;mucous membranes moist; oropharynx clear, tongue without atrophy or fasciculations.  Neck: Supple, trachea midline.  Resp: CTAB, no w/r/r  CV: RRR, no m/g/r; nml S1 and S2. 2+ symmetric peripheral pulses.  Abd: soft/NT/ND; nabs x 4 quad  Extrem: Nml bulk; no cyanosis, clubbing, or edema.     Neuro:  *MS: Awake but drowsy, O x4. Follows multi-step commands.   *Speech: fluid, moderate dysarthria, minimal WFD, able to name most objects, repetition is intact.   *CN:     I: Deferred    II,III: PERRLA, VFF by confrontation, optic discs not  visualized 2/2 pupillary constriction    III,IV,VI: EOMI w/o nystagmus, severe ptosis on L    V: Sensation intact from V1 to V3 to LT    VII: Eyelid closure was full.  R UMN facial droop, worse than yesterday.    VIII: Hearing intact to voice    IX,X: Voice normal, palate elevates symmetrically     XI: SCM/trap 5/5 bilat    XII: Tongue protrudes midline, no atrophy or fasciculations   *Motor:   Normal bulk.  No tremor, rigidity or bradykinesia. Pronator drift RUE. LUE and LLE full strength throughout. No movement against gravity RUE and RLE.    *Sensory: Impaired to LT RUE and RLE  *Coordination:  FNF intact bilat  *Reflexes:  2+ and symmetric throughout without clonus; toes down-going bilat  *Gait: deferred     NIHSS     1a Level of Conscious.: 0 1b LOC Questions: 0 1c LOC Commands: 0 2 Best Gaze: 1 3 Visual: 0 4 Facial Palsy: 2 5a Motor Arm - left: 0 5b Motor Arm - Right: 3 6a Motor Leg - Left: 0 6b Motor Leg - Right: 3 7 Limb Ataxia: 0 8 Sensory: 1 9 Best Language: 1 10 Dysarthria: 2 11 Extinct. and Inatten.: 0   TOTAL: 13   Impression: This is a 65 yo gentleman with hx HTN, DM2, HL who presented with multiple neurologic sx and was found to have 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 On exam he has a  L cranial nerve 3 palsy with L ptosis and inability to adduct, R UMN facial droop, mild weakness RUE and RLE, dysarthria, and minimal aphasia. There is concern for potential central embolic source given his infarcts in both anterior and posterior circulation but both TTE and TEE showed no e/o intracardiac clot.   Episode today favored to be 2/2 symptomatic relative hypotension in the setting of multifocal intracranial stenosis. Losartan d/c'd and 1L NS bolus administered. No acute findings on CT head and no LVO on CTA.  Recommendations: - Repeat MRI brain wo contrast r/o extension of stroke - Permissive HTN up to SBP 220;  IV hydralazine or labetalol for BP outside parameters. - IVF to maintain SBP > 135 - Continue ASA 81mg  daily + plavix 75mg  daily x90 days given severe intracranial stenosis f/b ASA 81mg  daily after that - Atorvastatin 80mg  daily - STAT head CT for any change in neuro exam - Tele - PT/OT/SLP - Stroke education - Amb referral to neurology upon discharge. He will need a Zio patch in clinic for ambulatory cardiac monitoring given infarcts in multiple vascular distributions that could suggest an embolic source.    This patient is critically ill and at significant risk of neurological worsening, death and care requires constant monitoring of vital signs, hemodynamics,respiratory and cardiac monitoring, neurological assessment, discussion with family, other specialists and medical decision making of high complexity. I spent 90 minutes of neurocritical care time  in the care of  this patient. This was time spent independent of any time provided by nurse practitioner or PA.  , MD [facility] [phone]  If 7pm- 7am, please page neurology on call as listed in [facility].

## 2020-10-09 NOTE — Progress Notes (Signed)
Patient's significant other [name] present throughout majority of shift. Multiple episodes of erratic behavior and flight of ideas noted. Despite multiple attempts at education and re-education on plan of care/interventions, patient's significant other continues to impede patient care and cause disturbances at the nurse's station.  [name] frequently changes her story about her relationship to the patient, where they are from and how long they have known each other.

## 2020-10-09 NOTE — Progress Notes (Signed)
PROGRESS NOTE    Raymond Kerr  HGD:[id number] DOB: [birth year] DOA: 10/06/2020 PCP: Barbette Reichmann, MD    Brief Narrative:  Raymond Kerr is a 65 y.o. African-American male with medical history significant for type II diabetes mellitus, GERD and hypertension, who presented to the emergency room with acute onset of diplopia and generalized weakness that started 8 hours prior to presentation.  The patient has been having expressive dysphasia. On arrival to the emergency room here it appears that the patient has left-sided weakness and difficulty with speech.  MRI of the brain showed small acute infarcts of the left paramedian frontal lobe and ventromedial left thalamus. Neurology consult has been obtained. MRA head showed a short segment of the left ACA A2 segment.  8/17- TEE today 8/18- per nsg pt had worsening of right-sided weakness and neurology and I were notified.  Code stroke was called.  Patient underwent CTA.  Neurology saw patient and felt this was symptomatic relative to hypotension in the setting of multifocal intracranial stenosis.  His losartan was discontinued and was given IV bolus fluid.  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  Acute left ACA ischemic stroke. Neurology following TEE negative for thrombus in the left atrial/left atrial appendage.  EF 50 to 55% CT head today with no acute intracranial hemorrhage for this morning's event. Today's event likely due to relative hypotension in the setting of multifocal intracranial stenosis.  Losartan discontinued.  IV fluid bolus was ordered. Repeat MRI brain without contrast rule out extension of stroke Permissive hypertension up to SBP 220, IV hydralazine and labetalol for BP outside of parameters IV fluids to maintain  SBP more than 135  Continue aspirin daily plus Plavix 75 mg daily x90 days given severe intracranial stenosis, F/B aspirin 81 mg daily after that Atorvastatin 80 mg daily Stat head CT for any change in neuro exam Telemetry PT/OT/SLP Ambulatory referral to neurology upon discharge.  He will need a Zio patch in clinic for ambulatory cardiac monitoring given inferring multiple vascular distribution that could suggest an embolic source. CIR placement pending    2.  Type 2 diabetes. BG stable Continue R-ISS  3.  Hypokalemia. Improved with supplementation   DVT prophylaxis: Lovenox Code Status: full Family Communication: wife updated Disposition Plan:    Status is: Inpatient  Remains inpatient appropriate because:Inpatient level of care appropriate due to severity of illness  Dispo: The patient is from: Home              Anticipated d/c is to:  CIR              Patient currently is not medically stable to d/c.   Difficult to place patient No        I/O last 3 completed shifts: In: -  Out: 1100 [Urine:1100] No intake/output data recorded.     Consultants:  Neurology  Procedures: None  Antimicrobials: None   Subjective: Pt sleeping and snorring, does not wake up with sternal rub. Wife at bedside. PT at bedside  Objective: Vitals:   10/08/20 2102 10/09/20 0047 10/09/20 0437 10/09/20 0749  BP: 133/65 (!) 141/81 123/69 128/72  Pulse: (!) 102 91 85 82  Resp: 16 18 16 16   Temp: 99.6 F (37.6 C) 98.5 F (36.9  C) 98.4 F (36.9 C) 98.4 F (36.9 C)  TempSrc:  Oral    SpO2: 100% 97% 99% 97%  Weight:      Height:        Intake/Output Summary (Last 24 hours) at 10/09/2020 0815 Last data filed at 10/08/2020 2100 Gross per 24 hour  Intake --  Output 600 ml  Net -600 ml   Filed Weights   10/05/20 1950 10/06/20 2045  Weight: 118.8 kg 120.4 kg    Examination: Sleeping, snoring, does not wake up with sternal rub Cta no w/r/r ant. Regular s1/s2 no  gallop Soft benign +bs Mild edema Unable to do neuro exam    Data Reviewed: I have personally reviewed following labs and imaging studies  CBC: Recent Labs  Lab 10/05/20 2002 10/06/20 0818  WBC 5.3 6.1  NEUTROABS 2.9  --   HGB 13.2 10.4*  HCT 40.5 30.9*  MCV 89.4 90.6  PLT 277 265   Basic Metabolic Panel: Recent Labs  Lab 10/05/20 2002 10/06/20 0818 10/07/20 0444  NA 135 136 136  K 3.7 3.2* 3.8  CL 100 101 105  CO2 27 26 27   GLUCOSE 260* 167* 194*  BUN 14 13 14   CREATININE 1.07 1.09 1.10  CALCIUM 9.1 8.5* 8.6*  MG  --   --  1.7   GFR: Estimated Creatinine Clearance: 89.7 mL/min (by C-G formula based on SCr of 1.1 mg/dL). Liver Function Tests: Recent Labs  Lab 10/05/20 2002  AST 27  ALT 14  ALKPHOS 70  BILITOT 0.6  PROT 7.4  ALBUMIN 3.3*   No results for input(s): LIPASE, AMYLASE in the last 168 hours. No results for input(s): AMMONIA in the last 168 hours. Coagulation Profile: Recent Labs  Lab 10/05/20 2002  INR 1.0   Cardiac Enzymes: No results for input(s): CKTOTAL, CKMB, CKMBINDEX, TROPONINI in the last 168 hours. BNP (last 3 results) No results for input(s): PROBNP in the last 8760 hours. HbA1C: Recent Labs    10/06/20 0818  HGBA1C 10.1*   CBG: Recent Labs  Lab 10/08/20 1743 10/08/20 2105 10/09/20 0102 10/09/20 0433 10/09/20 0747  GLUCAP 140* 216* 165* 179* 126*   Lipid Profile: Recent Labs    10/06/20 0818  CHOL 174  HDL 30*  LDLCALC 124*  TRIG 102  CHOLHDL 5.8   Thyroid Function Tests: No results for input(s): TSH, T4TOTAL, FREET4, T3FREE, THYROIDAB in the last 72 hours. Anemia Panel: No results for input(s): VITAMINB12, FOLATE, FERRITIN, TIBC, IRON, RETICCTPCT in the last 72 hours. Sepsis Labs: No results for input(s): PROCALCITON, LATICACIDVEN in the last 168 hours.  Recent Results (from the past 240 hour(s))  Resp Panel by RT-PCR (Flu A&B, Covid) Nasopharyngeal Swab     Status: None   Collection Time: 10/06/20   1:23 AM   Specimen: Nasopharyngeal Swab; Nasopharyngeal(NP) swabs in vial transport medium  Result Value Ref Range Status   SARS Coronavirus 2 by RT PCR NEGATIVE NEGATIVE Final    Comment: (NOTE) SARS-CoV-2 target nucleic acids are NOT DETECTED.  The SARS-CoV-2 RNA is generally detectable in upper respiratory specimens during the acute phase of infection. The lowest concentration of SARS-CoV-2 viral copies this assay can detect is 138 copies/mL. A negative result does not preclude SARS-Cov-2 infection and should not be used as the sole basis for treatment or other patient management decisions. A negative result may occur with  improper specimen collection/handling, submission of specimen other than nasopharyngeal swab, presence of viral mutation(s) within the areas targeted by  this assay, and inadequate number of viral copies(<138 copies/mL). A negative result must be combined with clinical observations, patient history, and epidemiological information. The expected result is Negative.  Fact Sheet for Patients:  BloggerCourse.com  Fact Sheet for Healthcare Providers:  SeriousBroker.it  This test is no t yet approved or cleared by the [place] FDA and  has been authorized for detection and/or diagnosis of SARS-CoV-2 by FDA under an Emergency Use Authorization (EUA). This EUA will remain  in effect (meaning this test can be used) for the duration of the COVID-19 declaration under Section 564(b)(1) of the Act, 21 U.S.C.section 360bbb-3(b)(1), unless the authorization is terminated  or revoked sooner.       Influenza A by PCR NEGATIVE NEGATIVE Final   Influenza B by PCR NEGATIVE NEGATIVE Final    Comment: (NOTE) The Xpert Xpress SARS-CoV-2/FLU/RSV plus assay is intended as an aid in the diagnosis of influenza from Nasopharyngeal swab specimens and should not be used as a sole basis for treatment. Nasal washings and aspirates  are unacceptable for Xpert Xpress SARS-CoV-2/FLU/RSV testing.  Fact Sheet for Patients: BloggerCourse.com  Fact Sheet for Healthcare Providers: SeriousBroker.it  This test is not yet approved or cleared by the [place] FDA and has been authorized for detection and/or diagnosis of SARS-CoV-2 by FDA under an Emergency Use Authorization (EUA). This EUA will remain in effect (meaning this test can be used) for the duration of the COVID-19 declaration under Section 564(b)(1) of the Act, 21 U.S.C. section 360bbb-3(b)(1), unless the authorization is terminated or revoked.  Performed at [facility], [address], [place], [place] [address]          Radiology Studies: CT HEAD WO CONTRAST ( )  Result Date: 10/08/2020 CLINICAL DATA:  Neuro deficit, acute, stroke suspected. Elevated blood pressure. Left facial weakness. EXAM: CT HEAD WITHOUT CONTRAST TECHNIQUE: Contiguous axial images were obtained from the base of the skull through the vertex without intravenous contrast. COMPARISON:  MRI studies 2 days ago.  Head CT 3 days ago. FINDINGS: Brain: Old infarction in the left cerebellum as previously seen. No acute cerebellar infarction. Low-density in the left medial mid brain and left anteromedial thalamus consistent with known acute infarction in those locations. No evidence of extension, hemorrhage or significant mass effect. Low-density related to a left frontal white matter infarction at the inferior frontal lobe. Infarction at the medial left frontoparietal vertex visible as an area of low-density. Other smaller infarctions described by MRI are not visible by CT. No new or progressive lesions. There is old cortical and subcortical infarction in the right frontal lobe. Vascular: There is atherosclerotic calcification of the major vessels at the base of the brain. Skull: Negative Sinuses/Orbits: Clear/normal Other: None  IMPRESSION: No evidence of new or progressive insult. No evidence of hemorrhagic transformation. Of the acute infarctions shown by MRI, the infarctions in the left midbrain, thalamus, inferior frontal lobe and left frontoparietal vertex are visible by CT is areas of low-density. Electronically Signed   By: Paulina Fusi M.D.   On: 10/08/2020 14:51   ECHO TEE  Result Date: 10/08/2020    TRANSESOPHOGEAL ECHO REPORT   Patient Name:   OWYN RAULSTON Date of Exam: 10/08/2020 Medical Rec #:  [id number]     Height:       72.0 in Accession #:    [id number]    Weight:       265.4 lb Date of Birth:  14-Feb-1956    BSA:  2.403 m Patient Age:    64 years      BP:           105/55 mmHg Patient Gender: M             HR:           85 bpm. Exam Location:  [facility] Procedure: Transesophageal Echo, Cardiac Doppler, Color Doppler and Saline            Contrast Bubble Study Indications:     Not listed on TEE check-in sheet  History:         Patient has prior history of Echocardiogram examinations, most                  recent 10/06/2020. Risk Factors:Diabetes and Hypertension.  Sonographer:     Cristela Blue Referring Phys:  [id number] Lamar Blinks Diagnosing Phys: Arnoldo Hooker MD PROCEDURE: The transesophogeal probe was passed without difficulty through the esophogus of the patient. Sedation performed by performing physician. The patient developed no complications during the procedure. IMPRESSIONS  1. Left ventricular ejection fraction, by estimation, is 50 to 55%. The left ventricle has low normal function.  2. Right ventricular systolic function is normal. The right ventricular size is normal.  3. No left atrial/left atrial appendage thrombus was detected.  4. The mitral valve is normal in structure. Trivial mitral valve regurgitation.  5. The aortic valve is normal in structure. Aortic valve regurgitation is not visualized.  6. There is mild (Grade II) layered and protruding plaque.  7. Agitated saline contrast bubble study was  negative, with no evidence of any interatrial shunt. FINDINGS  Left Ventricle: Left ventricular ejection fraction, by estimation, is 50 to 55%. The left ventricle has low normal function. The left ventricular internal cavity size was normal in size. Right Ventricle: The right ventricular size is normal. No increase in right ventricular wall thickness. Right ventricular systolic function is normal. Left Atrium: Left atrial size was normal in size. No left atrial/left atrial appendage thrombus was detected. Right Atrium: Right atrial size was normal in size. Pericardium: There is no evidence of pericardial effusion. Mitral Valve: The mitral valve is normal in structure. Trivial mitral valve regurgitation. Tricuspid Valve: The tricuspid valve is normal in structure. Tricuspid valve regurgitation is trivial. Aortic Valve: The aortic valve is normal in structure. Aortic valve regurgitation is not visualized. Pulmonic Valve: The pulmonic valve was normal in structure. Pulmonic valve regurgitation is trivial. Aorta: The aortic root and ascending aorta are structurally normal, with no evidence of dilitation. There is mild (Grade II) layered and protruding plaque. IAS/Shunts: No atrial level shunt detected by color flow Doppler. Agitated saline contrast was given intravenously to evaluate for intracardiac shunting. Agitated saline contrast bubble study was negative, with no evidence of any interatrial shunt. Arnoldo Hooker MD Electronically signed by Arnoldo Hooker MD Signature Date/Time: 10/08/2020/5:07:15 PM    Final         Scheduled Meds:   stroke: mapping our early stages of recovery book   Does not apply Once   amLODipine  5 mg Oral Daily   aspirin EC  81 mg Oral Daily   atorvastatin  80 mg Oral Daily   clopidogrel  75 mg Oral Daily   enoxaparin (LOVENOX) injection  0.5 mg/kg Subcutaneous Q24H   glimepiride  4 mg Oral Q breakfast   insulin aspart  0-15 Units Subcutaneous Q4H   losartan  100 mg Oral Daily    pioglitazone  30  mg Oral Daily   Continuous Infusions:   LOS: 2 days    Time spent: 35 minutes with more than 50% on COC    Lynn ItoSahar Nyara Capell, MD [facility]   To contact the attending provider between 7A-7P or the covering provider during after hours 7P-7A, please log into the web site [email or web address] and access using universal [place] password for that web site. If you do not have the password, please call the hospital operator.  10/09/2020, 8:15 AM

## 2020-10-09 NOTE — Progress Notes (Addendum)
Physical Therapy Treatment Patient Details Name: Raymond Kerr MRN: [id number] DOB: 09/05/1955 Today's Date: 10/09/2020    History of Present Illness 65 y.o. African-American male with medical history significant for type II diabetes mellitus, GERD and hypertension, who presented to the emergency room with acute onset of diplopia and generalized weakness that started 8 hours prior to presentation.  The patient has been having expressive aphasia. 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 No hemorrhage or mass effect."    PT Comments    Pt in bed, moderate use of multi-modal cues to arouse. Pt was fatigued throughout treatment, but able to participate and follow-one step commands stating he "wanted to get out of bed."   Pt continues to demonstrate decreased functionality of RUE and RLE. Pt was unable to initiate voluntary movement of RUE and RLE. Pt requires mod-A for supine > sit transfers w/ HOB elevated. Max-A + 2 physical assist for transfers including lateral scooting and sit <> stand. Sit <> stand improved from last session, but at least modA needed throughout due to R lateral lean. In sitting, pt is able to bring back to midline with multi-modal cues, and is able to assist with scooting in recliner for repositioning. Skilled PT intervention is indicated to address deficits in function, mobility, and to return to PLOF as able.  Discharge recommendations remain CIR due to significant changes from PLOF.      Follow Up Recommendations  CIR;Supervision for mobility/OOB     Equipment Recommendations  Other (comment) (TBD next venue of care)    Recommendations for Other Services       Precautions / Restrictions Precautions Precautions: Fall Restrictions Weight Bearing Restrictions: No    Mobility  Bed Mobility Overal bed mobility: Needs Assistance Bed Mobility: Supine to Sit     Supine to sit: Mod assist Sit to  supine: HOB elevated        Transfers Overall transfer level: Needs assistance Equipment used: Rolling walker (2 wheeled) Transfers: Sit to/from Stand;Lateral/Scoot Transfers Sit to Stand: Max assist;+2 physical assistance        Lateral/Scoot Transfers: Max assist;+2 physical assistance    Ambulation/Gait             General Gait Details: deferred   Stairs             Wheelchair Mobility    Modified Rankin (Stroke Patients Only)       Balance Overall balance assessment: Needs assistance Sitting-balance support: Feet supported;Bilateral upper extremity supported Sitting balance-Leahy Scale: Fair Sitting balance - Comments: Pt demonstrates R lateral trunk lean, able to correct with multi-modal cues Postural control: Right lateral lean   Standing balance-Leahy Scale: Poor Standing balance comment: Required Max A +2 for support secondary to R lateral trunk lean and RLE knee buckling                            Cognition Arousal/Alertness: Lethargic Behavior During Therapy: Flat affect;WFL for tasks assessed/performed Overall Cognitive Status: Impaired/Different from baseline Area of Impairment: Problem solving                             Problem Solving: Slow processing        Exercises General Exercises - Upper Extremity Shoulder Flexion: PROM;Right;10 reps;Supine Elbow Flexion: PROM;Right;Supine Elbow Extension: PROM;Right;Supine General Exercises - Lower Extremity Heel Slides: PROM;Right;Supine;10 reps Other  Exercises Other Exercises: Seated weight shifting x 7 w/ min-guard for cues    General Comments        Pertinent Vitals/Pain Pain Assessment: No/denies pain    Home Living                      Prior Function            PT Goals (current goals can now be found in the care plan section) Acute Rehab PT Goals Patient Stated Goal: To feel better PT Goal Formulation: With patient Time For Goal  Achievement: 10/20/20 Potential to Achieve Goals: Good Progress towards PT goals: Progressing toward goals    Frequency    7X/week      PT Plan Current plan remains appropriate    Co-evaluation              AM-PAC PT "6 Clicks" Mobility   Outcome Measure  Help needed turning from your back to your side while in a flat bed without using bedrails?: A Lot Help needed moving from lying on your back to sitting on the side of a flat bed without using bedrails?: A Lot Help needed moving to and from a bed to a chair (including a wheelchair)?: A Lot Help needed standing up from a chair using your arms (e.g., wheelchair or bedside chair)?: A Lot Help needed to walk in hospital room?: Total Help needed climbing 3-5 steps with a railing? : Total 6 Click Score: 10    End of Session Equipment Utilized During Treatment: Gait belt Activity Tolerance: Patient tolerated treatment well;Patient limited by fatigue Patient left: in chair;with call bell/phone within reach;with chair alarm set Nurse Communication: Mobility status PT Visit Diagnosis: Unsteadiness on feet (R26.81);Other abnormalities of gait and mobility (R26.89);Muscle weakness (generalized) (M62.81);Other symptoms and signs involving the nervous system (R29.898)     Time: 7026-3785 PT Time Calculation (min) (ACUTE ONLY): 42 min  Charges:                        [employer], SPT

## 2020-10-10 ENCOUNTER — Inpatient Hospital Stay: Payer: Medicaid Other

## 2020-10-10 DIAGNOSIS — E876 Hypokalemia: Secondary | ICD-10-CM

## 2020-10-10 DIAGNOSIS — E119 Type 2 diabetes mellitus without complications: Secondary | ICD-10-CM

## 2020-10-10 LAB — GLUCOSE, CAPILLARY
Glucose-Capillary: 108 mg/dL — ABNORMAL HIGH (ref 70–99)
Glucose-Capillary: 115 mg/dL — ABNORMAL HIGH (ref 70–99)
Glucose-Capillary: 125 mg/dL — ABNORMAL HIGH (ref 70–99)
Glucose-Capillary: 135 mg/dL — ABNORMAL HIGH (ref 70–99)
Glucose-Capillary: 85 mg/dL (ref 70–99)
Glucose-Capillary: 90 mg/dL (ref 70–99)

## 2020-10-10 LAB — CBC
HCT: 37.1 % — ABNORMAL LOW (ref 39.0–52.0)
Hemoglobin: 12.3 g/dL — ABNORMAL LOW (ref 13.0–17.0)
MCH: 29.6 pg (ref 26.0–34.0)
MCHC: 33.2 g/dL (ref 30.0–36.0)
MCV: 89.4 fL (ref 80.0–100.0)
Platelets: 255 10*3/uL (ref 150–400)
RBC: 4.15 MIL/uL — ABNORMAL LOW (ref 4.22–5.81)
RDW: 14.6 % (ref 11.5–15.5)
WBC: 6 10*3/uL (ref 4.0–10.5)
nRBC: 0 % (ref 0.0–0.2)

## 2020-10-10 IMAGING — CT CT HEAD W/O CM
4 series · 16 of 47 positions shown, 18 images · non-contrast
Comparison: Brain MRI from yesterday

CLINICAL DATA: New aphasia in a patient with known acute and
subacute infarct

EXAM:
CT HEAD WITHOUT CONTRAST
TECHNIQUE: Contiguous axial images were obtained from the base of the skull
through the vertex without intravenous contrast.

[Series 2: head bone · axial · 0.43mm/px · z∈[-190,-158]mm · 3 of 82 slices shown]
[im 9/82  bone]
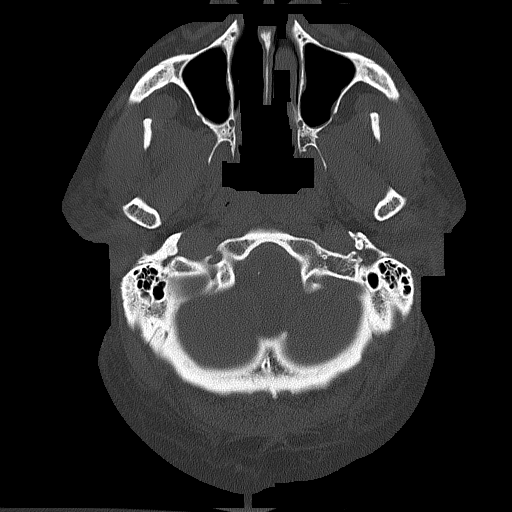
[im 17/82  bone]
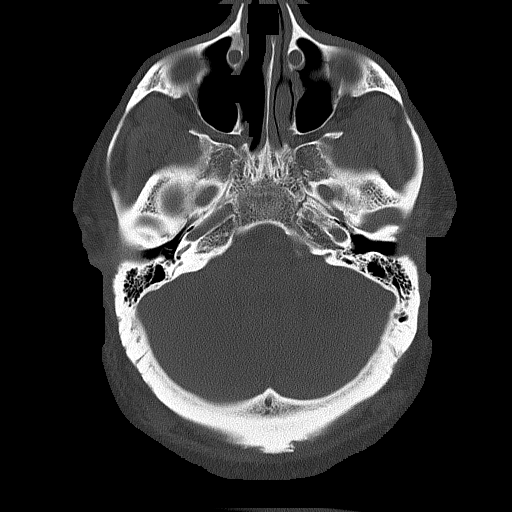
[im 25/82  bone]
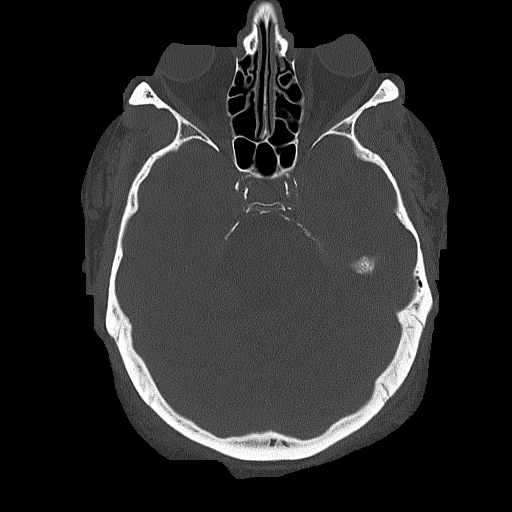

[Series 3: head wo · axial · 0.43mm/px · z∈[-186,-66]mm · 7 of 33 slices shown, 9 images]
[im 5/33  brain]
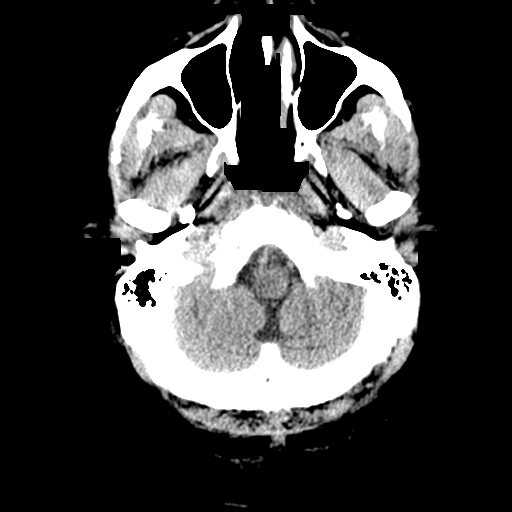
[im 5/33  bone]
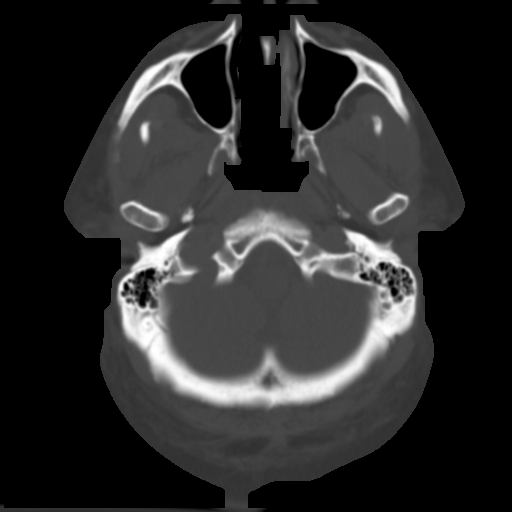
[im 9/33  brain]
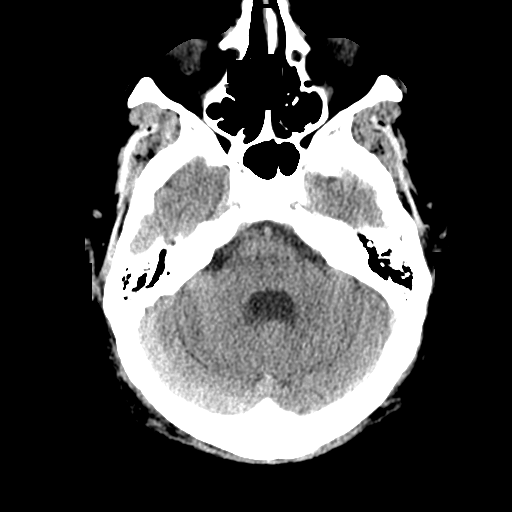
[im 13/33  brain]
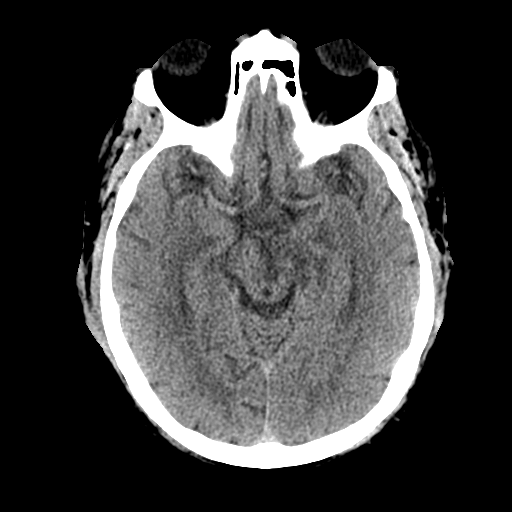
[im 17/33  brain]
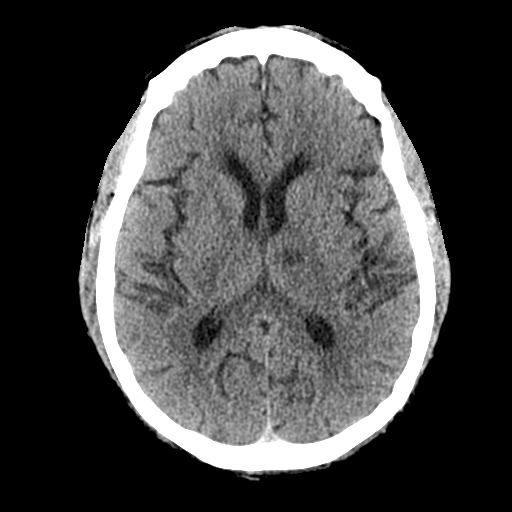
[im 21/33  brain]
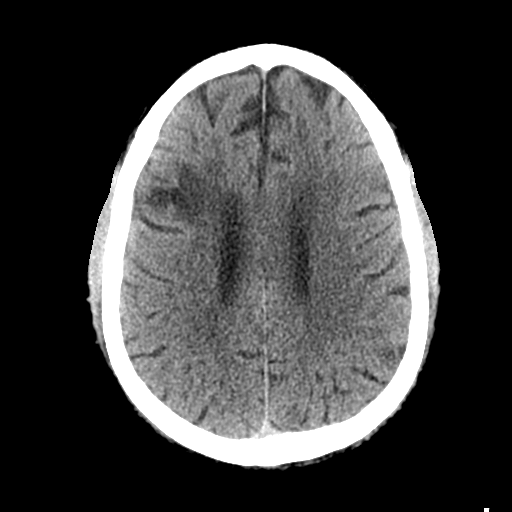
[im 21/33  bone]
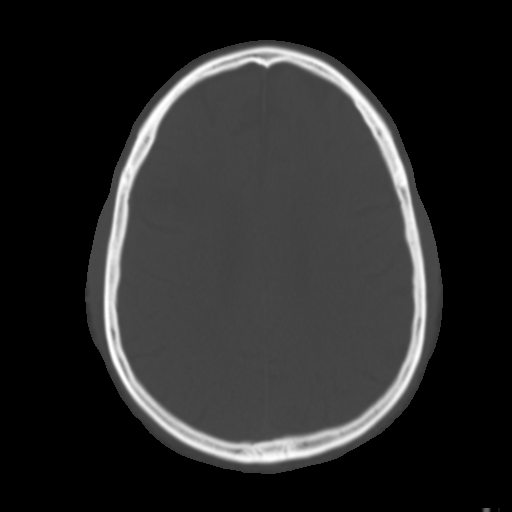
[im 25/33  brain]
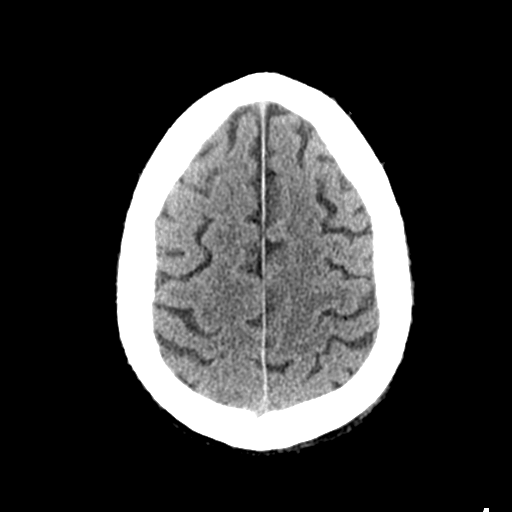
[im 29/33  brain]
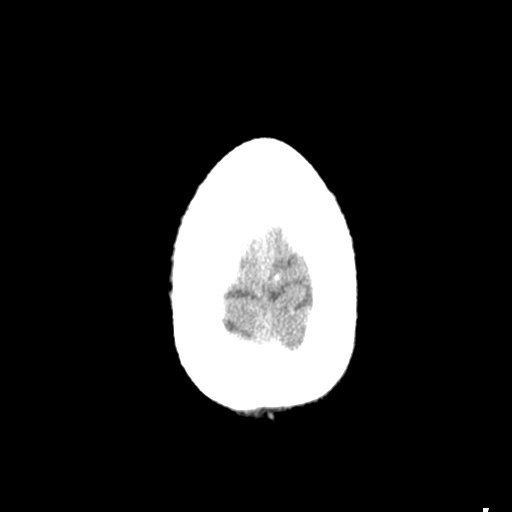

[Series 4: coronal soft tissue · coronal · 0.34mm/px · 3 of 78 slices shown]
[im 26/78  brain]
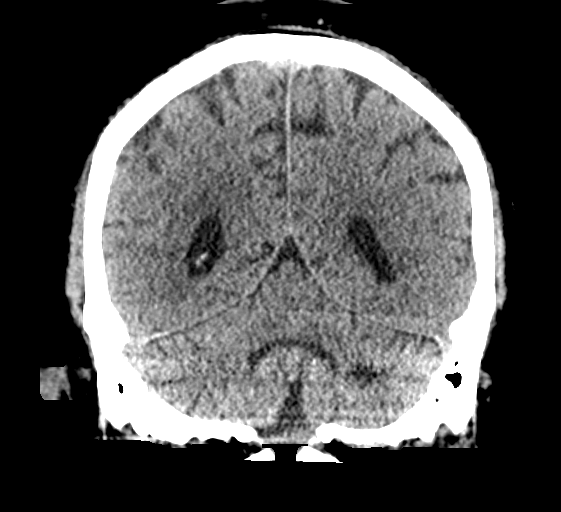
[im 35/78  brain]
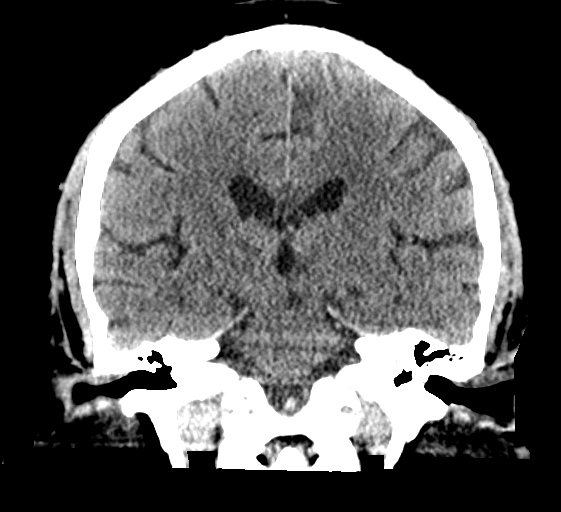
[im 43/78  brain]
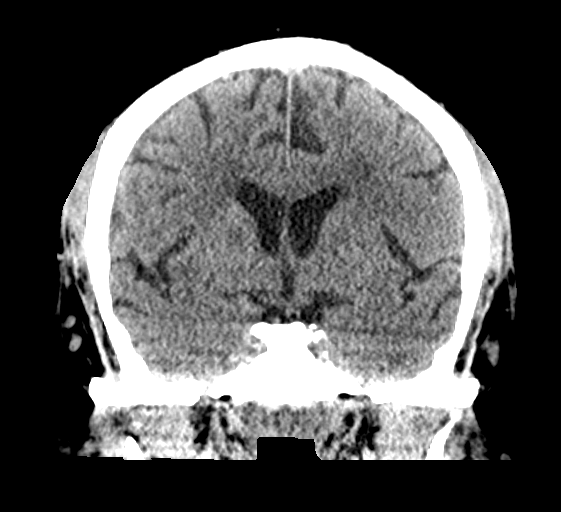

[Series 5: sagittal soft tissue · sagittal · 0.34mm/px · 3 of 65 slices shown]
[im 22/65  brain]
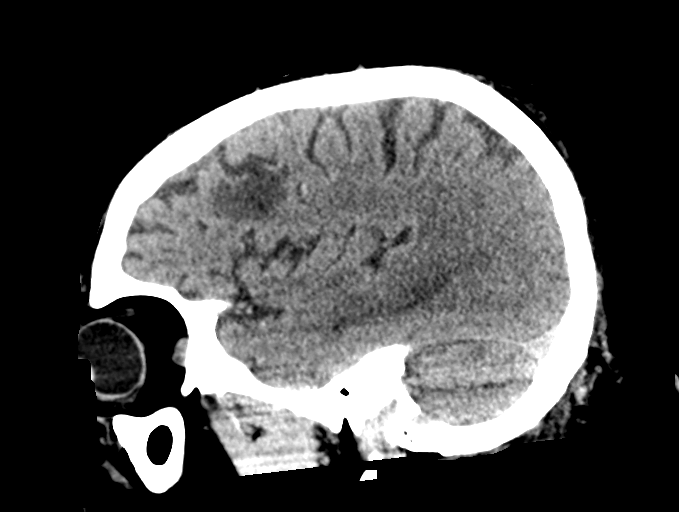
[im 33/65  brain]
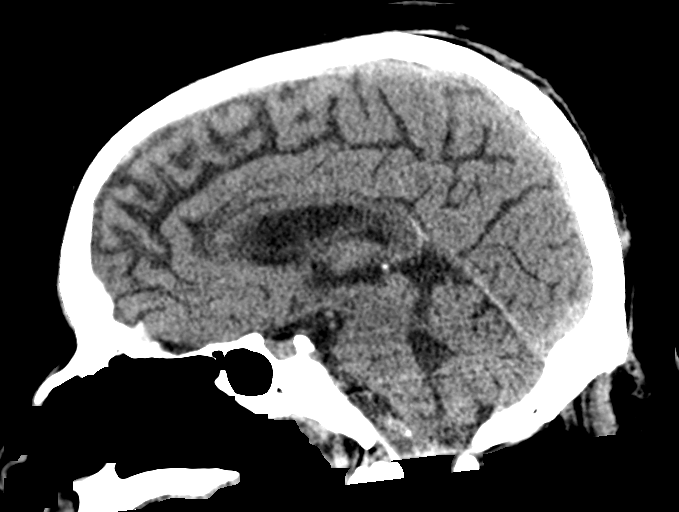
[im 43/65  brain]
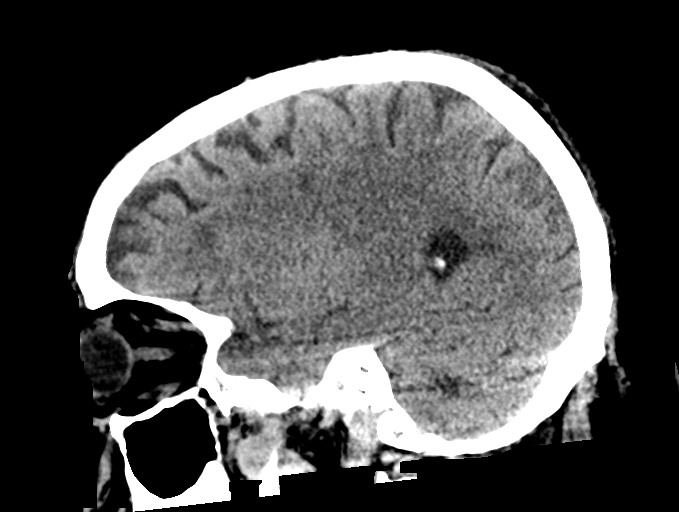

[16 of 47 positions shown; findings below may reference images not displayed]

FINDINGS: Brain: Known recent infarcts in the left ACA territory cortex, left
thalamus, and left midbrain. Remote right lateral frontal and left
cerebellar infarcts. Remote right thalamic lacunar infarct. No
hemorrhage or visible new infarct. No hydrocephalus or masslike
finding

Vascular: No hyperdense vessel or unexpected calcification.

Skull: Normal. Negative for fracture or focal lesion.

Sinuses/Orbits: No acute finding.
IMPRESSION: No hemorrhagic conversion or visible progression since brain MRI
yesterday.

## 2020-10-10 MED ORDER — DEXTROSE-NACL 5-0.45 % IV SOLN: INTRAVENOUS | Status: DC

## 2020-10-10 MED ORDER — MIDODRINE HCL 5 MG PO TABS
5.0000 mg | ORAL_TABLET | Freq: Three times a day (TID) | ORAL | Status: DC
  Administered 2020-10-14 – 2020-10-15 (×4): 5 mg via ORAL
  Filled 2020-10-10 (×4): qty 1

## 2020-10-10 MED ORDER — SENNOSIDES 8.8 MG/5ML PO SYRP
5.0000 mL | ORAL_SOLUTION | Freq: Every evening | ORAL | Status: DC | PRN
  Administered 2020-10-17: 5 mL via ORAL
  Filled 2020-10-10 (×3): qty 5

## 2020-10-10 NOTE — Progress Notes (Signed)
Please see previous notes regarding [name]. Due to threatening actions and comments to staff, [name] was instructed to leave the campus and escorted out by security. She is not to return this hospitalization. Perlie Mayo, RN

## 2020-10-10 NOTE — Progress Notes (Signed)
Contacted by pt's significant other(fiancee), [name] about patient care concerns. Made unit AD aware of concerns. Spoke with Ms. Alston for 50 minutes. Tried  to reassure her. Some of her concerns include the staff does not respond quick enough, and does not check blood pressure when she asks. Explained blood pressures were checked on a routine schedule unless there was a change in patient status. I also explained the RNs had other patients to care for and unless there was an emergency, it may be a little bit before the RN responds to the room. She stated "I don't give a damn about the other patients he is the only one I care about". Tried to explain we were providing the needed care to keep the patient safe. She then stated "I am not an employee here and I shouldn't have to assist with him eating, especially when I see staff sitting on their ass.". She asked about how to transfer him to another hospital. I explained she needed to make his RN aware, the RN would notify the MD, the MD would try to find a accepting MD and an available bed. She spoke about many subjects during our discussion. I gave her the [facility] number to call if she had other concerns. Throughout the day she came to the [facility] office, approximately 5 times to express the same concerns or to give me an update. At times she seemed very angry.  She did speak approvingly of the care the neurologist provided.

## 2020-10-10 NOTE — Progress Notes (Signed)
Eeg done 

## 2020-10-10 NOTE — TOC Progression Note (Signed)
Transition of Care [facility]) - Progression Note    Patient Details  Name: Raymond Kerr MRN: [id number] Date of Birth: 1955-04-21  Transition of Care [facility]) CM/SW Contact  Allayne Butcher, RN Phone Number: 10/10/2020, 2:38 PM  Clinical Narrative:    RNCM reached out to DSS and made and APS report.  Patient's significant other is unreliable and volatile, there are no other contacts listed for the patient except for a daughter but no contact information.  Per [name] the Significant other the daughter is disabled.    Expected Discharge Plan: IP Rehab Facility Barriers to Discharge: Continued Medical Work up  Expected Discharge Plan and Services Expected Discharge Plan: IP Rehab Facility   Discharge Planning Services: CM Consult Post Acute Care Choice: IP Rehab Living arrangements for the past 2 months: No permanent address Youth worker)                 DME Arranged: N/A DME Agency: NA       HH Arranged: NA HH Agency: NA         Social Determinants of Health (SDOH) Interventions    Readmission Risk Interventions No flowsheet data found.

## 2020-10-10 NOTE — Progress Notes (Signed)
Per transport log pt. Taken to EEG at 1413

## 2020-10-10 NOTE — Progress Notes (Signed)
PT Cancellation Note  Patient Details Name: Raymond Kerr MRN: [id number] DOB: [birth year]   Cancelled Treatment:     PT attempt. Pt leaving floor for CT scan. Will continue to follow and progress as able per current POC.    Rushie Chestnut 10/10/2020, 9:01 AM

## 2020-10-10 NOTE — Progress Notes (Signed)
Neurology Stroke Code Note  Patient ID: 65 yo gentleman with hx HTN, DM2, HL who presented with multiple neurologic sx and was found to have 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   Subjective: - On 8/18 he was noted to have acute worsening of his R sided weakness and repeat brain MRI overnight showed extension of known L frontal and brainstem infarcts as well as area of watershed infarct along the internal borderzone on the left (personal review). - This AM patient became acute unresponsive and aphasic and was taken down for STAT head CT which showed no new acute findings  Exam: Vitals:   10/10/20 0841 10/10/20 1133  BP: 118/74 132/71  Pulse: 78 79  Resp:  18  Temp:  98.3 F (36.8 C)  SpO2:  100%   Physical Exam  Gen: Awake but drowsy, oriented to self only  HEENT: Atraumatic, normocephalic;mucous membranes moist; oropharynx clear, tongue without atrophy or fasciculations.  Neck: Supple, trachea midline.  Resp: CTAB, no w/r/r  CV: RRR, no m/g/r; nml S1 and S2. 2+ symmetric peripheral pulses.  Abd: soft/NT/ND; nabs x 4 quad  Extrem: Nml bulk; no cyanosis, clubbing, or edema.     Neuro:  *MS: Awake but drowsy, oriented to self only  *Speech: moderate dysarthria, expressive>receptive, only able to tell me his first name  *CN:     I: Deferred    II,III: PERRLA, VFF by confrontation, optic discs not visualized 2/2 pupillary constriction    III,IV,VI: EOMI w/o nystagmus, severe ptosis on L    V: Sensation intact from V1 to V3 to LT    VII: Eyelid closure was full.  R UMN facial droop    VIII: Hearing intact to voice    IX,X: Voice normal, palate elevates symmetrically     XI: SCM/trap 5/5 bilat    XII: Tongue protrudes midline, no atrophy or fasciculations   *Motor:    Normal bulk.  No tremor, rigidity or bradykinesia. Pronator drift RUE. LUE and LLE full strength throughout. No movement against gravity RUE and RLE.    *Sensory: Impaired to LT RUE and RLE  *Coordination:  FNF intact bilat  *Reflexes:  2+ and symmetric throughout without clonus; toes down-going bilat  *Gait: deferred     NIHSS     1a Level of Conscious.: 1 1b LOC Questions: 2 1c LOC Commands: 2 2 Best Gaze: 1 3 Visual: 0 4 Facial Palsy: 2 5a Motor Arm - left: 0 5b Motor Arm - Right: 3 6a Motor Leg - Left: 0 6b Motor Leg - Right: 3 7 Limb Ataxia: 0 8 Sensory: 1 9 Best Language: 1 10 Dysarthria: 2 11 Extinct. and Inatten.: 0   TOTAL: 18   Impression: This is a 65 yo gentleman with hx HTN, DM2, HL who presented with multiple neurologic sx and was found to have 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 There is concern for potential central embolic source given his infarcts in both anterior and posterior circulation but both TTE and TEE showed no e/o intracardiac clot.   Recommendations: - Permissive HTN up to SBP 220; IV hydralazine or labetalol  for BP outside parameters. - Losartan and amlodipine d/c'd - IVF to maintain SBP > 135 - Continue ASA 81mg  daily + plavix 75mg  daily x90 days given severe intracranial stenosis f/b ASA 81mg  daily after that - Atorvastatin 80mg  daily - STAT head CT for any change in neuro exam - Tele - PT/OT/SLP - Stroke education - Amb referral to neurology upon discharge. He will need a Zio patch in clinic for ambulatory cardiac monitoring given infarcts in multiple vascular distributions that could suggest an embolic source.  , MD [facility] [phone]  If 7pm- 7am, please page neurology on call as listed in [facility]ION.

## 2020-10-10 NOTE — Progress Notes (Signed)
[name] contacted 1C via telephone requesting to speak with charge to provoke patient information. No information related to patient admission or acknowledgment of admission was provided due to confidential encounter and [name] being escorted of site by security as noted in previous notes.

## 2020-10-10 NOTE — Progress Notes (Addendum)
Contacted by Verl Bangs, RN Charge RN 1Charge Nurse. She stated the patient's visitor, [name] had told a RN "you better watch your ass". That statement was perceived as a threat. Discussed with Perlie Mayo RN AD 1C about issue. Stated in addition to being threatening she was causing difficulty to the work flow of the unit, frequently asking blood pressures be repeated and other concerns she had for the patient. When I talked with her on 8/18 explained staff would not always be able to respond immediately unless it was an emergency. She stated "I don't give a damn about the other patients".She had been spoken to by Bryson Ha RN DD, Wynona Dove RN AD earlier in the week about her behavior.Security contacted, explained to visitor she needed to leave. MD. Bing Neighbors requested the visitor not to visit again to the staff. I had contacted security and expaplained to Ms. Raul Del she would need to leave the premises because of her behavior. As we were walking out, she wanted to speak with Administration. Went directly to [employer] and spoke with [employer]. He listened to her concerns and they discussed concerns of the unit staff. Ms. Raul Del said she would refrain from touching staff. It was agreed she could visit if she maintained control of herself. She agreed. I returned to the unit, explained what agreement had been reached. Dr. Selina Cooley was present and stated she did not want the visitor to come back as she had concerns for the staff and patients safety. Returned to administration with Dr. Selina Cooley and she spoke with Theodosia Quay about her concerns. Theodosia Quay agreed. Visitor [name] was brought back into administrative conference room. Dr. Selina Cooley and Theodosia Quay explained she could not visit. Visitor voiced her concerns. She agreed to leave voluntarily and walked out the medical mall doors. She stated she understood she could not come back. Updated Perlie Mayo and Verl Bangs.

## 2020-10-10 NOTE — TOC Progression Note (Signed)
Transition of Care [facility]) - Progression Note    Patient Details  Name: Raymond Kerr MRN: [id number] Date of Birth: 06/09/1955  [facility]) CM/SW Contact  Caryn Section, RN Phone Number: 10/10/2020, 9:11 AM  Clinical Narrative:   Discussed CIR with Luther Parody.  RNCM will contact patient registration to search for insurance.  Caitlin at [facility] will reach out to girlfriend to discuss a discharge address TOC to follow    Expected Discharge Plan: IP Rehab Facility Barriers to Discharge: Continued Medical Work up  Expected Discharge Plan and Services Expected Discharge Plan: IP Rehab Facility   Discharge Planning Services: CM Consult Post Acute Care Choice: IP Rehab Living arrangements for the past 2 months: No permanent address Youth worker)                 DME Arranged: N/A DME Agency: NA       HH Arranged: NA HH Agency: NA         Social Determinants of Health (SDOH) Interventions    Readmission Risk Interventions No flowsheet data found.

## 2020-10-10 NOTE — Progress Notes (Signed)
Dr. Marylu Lund notified of this nurse concern related to pt. acuity and the high acuity of 1C floor currently.   Pt. with change in neuro status this morning that prompted CT and EEG orders (see neurology note for details)-- pt. Q2 neuros, Q4 VS, CBC monitoring, NPO, with extensive time needed in room related to delayed aphasia and communication barriers.  Dr. Marylu Lund has placed a level of care transfer order to place pt. on stepdown unit when bed becomes available.  Will continue to monitor
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
## 2020-10-10 NOTE — Progress Notes (Signed)
Pt. Transfer to ICU pending an ICU bed per ICU charge at this time.

## 2020-10-10 NOTE — Progress Notes (Addendum)
Results for COLSEN, MODI (MRN [id number]) as of 10/10/2020 17:13  Ref. Range 10/10/2020 11:32 10/10/2020 16:26  Glucose-Capillary Latest Ref Range: 70 - 99 mg/dL 90 85    Dr. Marylu Lund notified of BS 90 @ 1132; see MAR for fluid orders
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
## 2020-10-10 NOTE — Progress Notes (Signed)
Pt back in room 111-1C
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
## 2020-10-10 NOTE — Procedures (Signed)
Routine EEG Report  Raymond Kerr is a 65 y.o. male with a history of encephalopathy and aphasia in the setting of recent multifocal ischemic infarcts who is undergoing an EEG to evaluate for seizures.  Report: This EEG was acquired with electrodes placed according to the International 10-20 electrode system (including Fp1, Fp2, F3, F4, C3, C4, P3, P4, O1, O2, T3, T4, T5, T6, A1, A2, Fz, Cz, Pz). The following electrodes were missing or displaced: none.  The occipital dominant rhythm was 6-7 Hz. This activity is reactive to stimulation. Drowsiness was manifested by background fragmentation; deeper stages of sleep were manifested by K complexes and sleep spindles. There was focal slowing over the left frontal region. There were no interictal epileptiform discharges. There were no electrographic seizures identified. There was no abnormal response to photic stimulation. Hyperventilation was not performed.  Impression and clinical correlation: This EEG was obtained while awake and asleep and is abnormal due to: - Moderate diffuse slowing indicating global cerebral dysfunction - Focal slowing over the left frontal region indicating focal dysfunction in that area consistent with known acute infarct  There were no electrographic seizures or other epileptiform abnormalities observed during this recording.  Bing Neighbors, MD [facility] [phone]  If 7pm- 7am, please page neurology on call as listed in [facility]ION.
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
## 2020-10-11 DIAGNOSIS — E871 Hypo-osmolality and hyponatremia: Secondary | ICD-10-CM

## 2020-10-11 LAB — GLUCOSE, CAPILLARY
Glucose-Capillary: 136 mg/dL — ABNORMAL HIGH (ref 70–99)
Glucose-Capillary: 133 mg/dL — ABNORMAL HIGH (ref 70–99)
Glucose-Capillary: 132 mg/dL — ABNORMAL HIGH (ref 70–99)
Glucose-Capillary: 154 mg/dL — ABNORMAL HIGH (ref 70–99)
Glucose-Capillary: 131 mg/dL — ABNORMAL HIGH (ref 70–99)
Glucose-Capillary: 143 mg/dL — ABNORMAL HIGH (ref 70–99)
Glucose-Capillary: 136 mg/dL — ABNORMAL HIGH (ref 70–99)

## 2020-10-11 LAB — SODIUM: Sodium: 135 mmol/L (ref 135–145)

## 2020-10-11 MED ORDER — CHLORHEXIDINE GLUCONATE CLOTH 2 % EX PADS
6.0000 | MEDICATED_PAD | Freq: Every day | CUTANEOUS | Status: DC
  Administered 2020-10-11 – 2020-10-24 (×12): 6 via TOPICAL

## 2020-10-11 NOTE — Progress Notes (Signed)
Pt previously stated he is not using cpap at home and prefers not to use one in hosp.

## 2020-10-11 NOTE — Progress Notes (Addendum)
Neurology Stroke Code Note  Patient ID: 65 yo gentleman with hx HTN, DM2, HL who presented with multiple neurologic sx and was found to have 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  Subjective: - No acute changes - On exam patient has significant comprehension delay but is able to nod or shake his head to simple questions appropriately. Significant expressive aphasia.  Exam: Vitals:   10/11/20 2000 10/11/20 2100  BP: 133/75 (!) 141/72  Pulse: 82   Resp:    Temp: 98.3 F (36.8 C)   SpO2: 100%    Physical Exam  Gen: Awake but drowsy, nonverbal today  HEENT: Atraumatic, normocephalic;mucous membranes moist; oropharynx clear, tongue without atrophy or fasciculations.  Neck: Supple, trachea midline.  Resp: CTAB, no w/r/r  CV: RRR, no m/g/r; nml S1 and S2. 2+ symmetric peripheral pulses.  Abd: soft/NT/ND; nabs x 4 quad  Extrem: Nml bulk; no cyanosis, clubbing, or edema.     Neuro:  *MS: Awake but drowsy, nonverbal today  *Speech: nonverbal today, able to shake head yes or no appropriately to simple questions after significant delay  *CN:     I: Deferred    II,III: PERRLA, VFF by confrontation, optic discs not visualized 2/2 pupillary constriction    III,IV,VI: EOMI w/o nystagmus, severe ptosis on L    V: Sensation intact from V1 to V3 to LT    VII: Eyelid closure was full.  R UMN facial droop    VIII: Hearing intact to voice    IX,X: Voice normal, palate elevates symmetrically     XI: SCM/trap 5/5 bilat    XII: Tongue protrudes midline, no atrophy or fasciculations   *Motor:   Normal bulk.  No tremor, rigidity or bradykinesia. Pronator drift RUE. LUE and LLE full strength throughout. No movement against gravity RUE and RLE.    *Sensory: Impaired to LT RUE and  RLE  *Coordination:  FNF intact bilat  *Reflexes:  2+ and symmetric throughout without clonus; toes down-going bilat  *Gait: deferred     NIHSS     1a Level of Conscious.: 1 1b LOC Questions: 2 1c LOC Commands: 2 2 Best Gaze: 1 3 Visual: 0 4 Facial Palsy: 2 5a Motor Arm - left: 0 5b Motor Arm - Right: 3 6a Motor Leg - Left: 0 6b Motor Leg - Right: 3 7 Limb Ataxia: 0 8 Sensory: 1 9 Best Language: 2 10 Dysarthria: 2 11 Extinct. and Inatten.: 0   TOTAL: 19   Impression: This is a 65 yo gentleman with hx HTN, DM2, HL who presented with multiple neurologic sx and was found to have 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 There is concern for potential central embolic source given his infarcts in both anterior and posterior circulation but both TTE and TEE showed no e/o intracardiac clot.   Recommendations: - Permissive HTN up to SBP 220; IV hydralazine or labetalol for BP outside parameters. - Losartan and amlodipine d/c'd - IVF to maintain SBP > 135 - Continue ASA 81mg  daily + plavix 75mg  daily x90 days given severe intracranial stenosis f/b ASA 81mg  daily after that - Atorvastatin  80mg  daily - STAT head CT for any change in neuro exam - Tele - PT/OT/SLP - Stroke education - Amb referral to neurology upon discharge. He will need a Zio patch in clinic for ambulatory cardiac monitoring given infarcts in multiple vascular distributions that could suggest an embolic source.  , MD [facility] [phone]  If 7pm- 7am, please page neurology on call as listed in AMION.

## 2020-10-11 NOTE — Progress Notes (Signed)
Patient NPO including meds r/t inability to demonstrate safe swallowing protocols and ability to pass yale swallow screen.

## 2020-10-11 NOTE — Progress Notes (Signed)
  Speech Language Pathology Treatment: Dysphagia  Patient Details Name: Raymond Kerr MRN: [id number] DOB: Feb 24, 1955 Today's Date: 10/11/2020 Time: 1200-1217 SLP Time Calculation (min) (ACUTE ONLY): 17 min  Assessment / Plan / Recommendation Clinical Impression  Pt with status change; events with visitor noted in chart. Currently in ICU, following some non-oral motor commands and communicating with head nod. Oral motor movement very minimal; requires assistance for mouth opening on command (but does so spontaneously with yawn). Lingual movement reduced, weak bilaterally. Unable to repeat words or say his name. Attempts to phonate are weak, low pitch, gutteral sounds. Able to swallow on command after prolonged period (approx 8 seconds). Oral care performed; administered ice chip with minimal oral manipulation and suspected passive transfer to pharynx, with extremely delayed swallow initiation (occurred with verbal, tactile cues after approximately 10 seconds). Appears to have had significant change in communication/swallow function from previous assessments. At this time recommend NPO due to high risk for aspiration; instrumental swallowing study likely necessary to assess function if no significant improvements. SLP to follow up Monday for reassessment and to determine readiness for MBS.     HPI HPI: Pt  is a 65 y.o. African-American male with medical history significant for type II diabetes mellitus, GERD and hypertension, who presented to the emergency room with acute onset of diplopia and generalized weakness that started 8 hours prior to presentation.  The patient has been having expressive dysphasia.  His fiance who accompanied him stated that his symptoms started around midnight on 8/13 with dysarthria and diplopia but he was more confused.  He was noted in the ER to have left facial droop and was unable to lift his left heel.  They went to [place] on Friday and he started having symptoms on  Saturday night with double vision.  They went to bed at 12 midnight and at 6 AM she noted that he knocked food on the floor and was later starting to have slurred speech.  He was feeling generally weak and unable to ambulate however without unilateral focal muscle weakness or paresthesias.  No chest pain or dyspnea or palpitations.  No cough or wheezing.  No urinary or stool incontinence.  CT Angio of head/neck: "No acute intracranial hemorrhage. Evolving areas of recent  infarction are identified in the left thalamus and left ACA  territory. No definite acute infarct.     No new large vessel occlusion. 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 Perfusion imaging demonstrates 4 mL of penumbra within the distal  left ACA territory.".  MRI: Small acute infarcts of the left paramedian frontal lobe and  ventromedial left thalamus. No hemorrhage or mass effect.      SLP Plan  Continue with current plan of care;New goals to be determined pending instrumental study       Recommendations  Diet recommendations: NPO Medication Administration: Via alternative means                Oral Care Recommendations: Oral care QID Follow up Recommendations: Inpatient Rehab;Skilled Nursing facility SLP Visit Diagnosis: Dysphagia, oropharyngeal phase (R13.12) Plan: Continue with current plan of care;New goals to be determined pending instrumental study       GO               Raymond Baton, MS, CCC-SLP Speech-Language Pathologist  Arlana Lindau 10/11/2020, 12:18 PM

## 2020-10-11 NOTE — Progress Notes (Signed)
Physical Therapy Discharge Patient Details Name: Raymond Kerr MRN: [id number] DOB: March 12, 1955 Today's Date: 10/11/2020 Time:  746     Patient discharged from PT services secondary to medical decline - will need to re-order PT to resume therapy services. Pt transferred to high level of care. Per PT protocol will sign off.   Please see latest therapy progress note for current level of functioning and progress toward goals.    Progress and discharge plan discussed with patient and/or caregiver: Patient unable to participate in discharge planning and no caregivers available    Jetta Lout PTA 10/11/20, 7:46 AM
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
## 2020-10-12 LAB — GLUCOSE, CAPILLARY
Glucose-Capillary: 154 mg/dL — ABNORMAL HIGH (ref 70–99)
Glucose-Capillary: 143 mg/dL — ABNORMAL HIGH (ref 70–99)
Glucose-Capillary: 157 mg/dL — ABNORMAL HIGH (ref 70–99)
Glucose-Capillary: 132 mg/dL — ABNORMAL HIGH (ref 70–99)
Glucose-Capillary: 144 mg/dL — ABNORMAL HIGH (ref 70–99)
Glucose-Capillary: 107 mg/dL — ABNORMAL HIGH (ref 70–99)

## 2020-10-12 LAB — MRSA NEXT GEN BY PCR, NASAL: MRSA by PCR Next Gen: NEGATIVE — AB
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
## 2020-10-13 DIAGNOSIS — E11649 Type 2 diabetes mellitus with hypoglycemia without coma: Secondary | ICD-10-CM

## 2020-10-13 LAB — GLUCOSE, CAPILLARY
Glucose-Capillary: 108 mg/dL — ABNORMAL HIGH (ref 70–99)
Glucose-Capillary: 118 mg/dL — ABNORMAL HIGH (ref 70–99)
Glucose-Capillary: 146 mg/dL — ABNORMAL HIGH (ref 70–99)
Glucose-Capillary: 163 mg/dL — ABNORMAL HIGH (ref 70–99)
Glucose-Capillary: 162 mg/dL — ABNORMAL HIGH (ref 70–99)
Glucose-Capillary: 184 mg/dL — ABNORMAL HIGH (ref 70–99)
Glucose-Capillary: 165 mg/dL — ABNORMAL HIGH (ref 70–99)

## 2020-10-13 MED ORDER — ASPIRIN 81 MG PO CHEW
81.0000 mg | CHEWABLE_TABLET | Freq: Every day | ORAL | Status: DC
  Administered 2020-10-13 – 2020-10-24 (×12): 81 mg via ORAL
  Filled 2020-10-13 (×12): qty 1

## 2020-10-13 NOTE — Progress Notes (Addendum)
Neurology Progress Note  Patient ID: 65 yo gentleman with hx HTN, DM2, HL who presented with multiple neurologic sx and was found to have 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 3 days after admission he developed worsening aphasia and R sided weakness. Repeat MRI brain revealed extension of both his L frontal and brainstem infarcts as well as new watershed infarct in the internal border zone on the L. 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  Subjective: - No acute changes - On exam patient has significant comprehension delay but is able to nod or shake his head to simple questions appropriately and also say his name today. Significant expressive aphasia.  Exam: Vitals:   10/13/20 0430 10/13/20 0500  BP:  125/64  Pulse: 88 92  Resp: 12 12  Temp:    SpO2: 97% 97%   Physical Exam  Gen: Awake but drowsy, nonverbal today  HEENT: Atraumatic, normocephalic;mucous membranes moist; oropharynx clear, tongue without atrophy or fasciculations.  Neck: Supple, trachea midline.  Resp: CTAB, no w/r/r  CV: RRR, no m/g/r; nml S1 and S2. 2+ symmetric peripheral pulses.  Abd: soft/NT/ND; nabs x 4 quad  Extrem: Nml bulk; no cyanosis, clubbing, or edema.     Neuro:  *MS: Awake but drowsy, able to answer first name only  *Speech: able to say only his first name, able to shake head yes or no appropriately to simple questions after significant delay  *CN:     I: Deferred    II,III: PERRLA, VFF by confrontation, optic discs not visualized 2/2 pupillary constriction    III,IV,VI: EOMI w/o nystagmus, severe ptosis on L    V: Sensation intact from V1 to V3 to LT    VII: Eyelid closure was full.  R UMN facial droop    VIII: Hearing intact to voice    IX,X: Voice normal, palate elevates symmetrically     XI: SCM/trap 5/5 bilat     XII: Tongue protrudes midline, no atrophy or fasciculations   *Motor:   Normal bulk.  No tremor, rigidity or bradykinesia. LUE and LLE full strength throughout. No movement against gravity RUE and RLE.    *Sensory: Impaired to LT RUE and RLE  *Coordination:  FNF intact bilat  *Reflexes:  2+ and symmetric throughout without clonus; toes down-going bilat  *Gait: deferred     NIHSS     1a Level of Conscious.: 1 1b LOC Questions: 2 1c LOC Commands: 2 2 Best Gaze: 1 3 Visual: 0 4 Facial Palsy: 2 5a Motor Arm - left: 0 5b Motor Arm - Right: 3 6a Motor Leg - Left: 0 6b Motor Leg - Right: 3 7 Limb Ataxia: 0 8 Sensory: 1 9 Best Language: 2 10 Dysarthria: 2 11 Extinct. and Inatten.: 0   TOTAL: 19   Impression: This is a 65 yo gentleman with hx HTN, DM2, HL who presented with multiple neurologic sx and was found to have 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 There is concern for potential central embolic source given his infarcts in both anterior and posterior circulation but both TTE and TEE showed no e/o intracardiac clot.   Recommendations: - Goal normotension; strict  avoidance of hypotension - patient extended his infarcts earlier this admission for relative hypotension SBP <110 - Continue ASA 81mg  daily + plavix 75mg  daily x90 days given severe intracranial stenosis f/b ASA 81mg  daily after that - Atorvastatin 80mg  daily - STAT head CT for any change in neuro exam - Tele - PT/OT/SLP - Stroke education - Amb referral to neurology upon discharge. He will need a Zio patch in clinic for ambulatory cardiac monitoring given infarcts in multiple vascular distributions that could suggest an embolic source. I will arrange outpatient neuro f/u.  Neurology will not continue to actively follow. Please re-engage if any  new neurologic concerns arise.   , MD [facility] [phone]  If 7pm- 7am, please page neurology on call as listed in AMION.

## 2020-10-13 NOTE — Evaluation (Addendum)
Clinical/Bedside Swallow Evaluation Patient Details  Name: Raymond Kerr MRN: [id number] Date of Birth: 02-Mar-1955  Today's Date: 10/13/2020 Time: SLP Start Time (ACUTE ONLY): 1305 SLP Stop Time (ACUTE ONLY): 1405 SLP Time Calculation (min) (ACUTE ONLY): 60 min  Past Medical History:  Past Medical History:  Diagnosis Date   Diabetes mellitus without complication (HCC)    GERD (gastroesophageal reflux disease)    Hypertension    Past Surgical History:  Past Surgical History:  Procedure Laterality Date   TEE WITHOUT CARDIOVERSION N/A 10/08/2020   Procedure: TRANSESOPHAGEAL ECHOCARDIOGRAM (TEE);  Surgeon: Lamar BlinksKowalski, Bruce J, MD;  Location: [facility] ORS;  Service: Cardiovascular;  Laterality: N/A;   HPI:  Pt  is a 65 y.o. African-American male with medical history significant for type II diabetes mellitus, GERD and hypertension, who presented to the emergency room with acute onset of diplopia and generalized weakness that started 8 hours prior to presentation.  The patient has been having expressive dysphasia.  His fiance who accompanied him stated that his symptoms started around midnight on 8/13 with dysarthria and diplopia but he was more confused.  He was noted in the ER to have left facial droop and was unable to lift his left heel.  [place] went to [place] on Friday and he started having symptoms on Saturday night with double vision.  They went to bed at 12 midnight and at 6 AM she noted that he knocked food on the floor and was later starting to have slurred speech.  He was feeling generally weak and unable to ambulate however without unilateral focal muscle weakness or paresthesias.  No chest pain or dyspnea or palpitations.  No cough or wheezing.  No urinary or stool incontinence.  CT Angio of head/neck: "No acute intracranial hemorrhage. Evolving areas of recent  infarction are identified in the left thalamus and left ACA  territory. No definite acute infarct.     No new large vessel  occlusion. 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 Perfusion imaging demonstrates 4 mL of penumbra within the distal  left ACA territory.".  MRI: Small acute infarcts of the left paramedian frontal lobe and  ventromedial left thalamus. No hemorrhage or mass effect.  UPDATED: 10/13/2020:  Per Neuro, "3 days after admission he developed worsening aphasia and R sided weakness. Repeat MRI brain revealed extension of both his L frontal and brainstem infarcts as well as new watershed infarct in the internal border zone on the L". With this decline in status, pt was transferred to CCU over the weekend(8/20) and made NPO.   Assessment / Plan / Recommendation Clinical Impression  Pt seen for a repeat BSE s/p decline in Neurological status and transfer to CCU; also NPO status over the weekend d/t concerns of Cognitive-linguistic status and dysphagia. Pt is nonverbal w/ apparent Receptive and Expressive Aphasia, language deficits which have worsened since admit. Per Neuro, "3 days after admission, he developed worsening aphasia and R sided weakness. Repeat MRI brain revealed extension of both his L frontal and brainstem infarcts as well as new watershed infarct in the internal border zone on the L".  Pt was nonverbal and did not follow any commands. He looked to tactile/verbal stim given x3/3 occasions; name called upon entering room.  At this BSE, pt appears to present w/ Mod+ oropharyngeal phase dysphagia w/ added impact from intermittent Drowsy State on his swallow function and his overall awareness/alertness for po  tasks. Oropharyngeal phase deficits noted w/ neuromuscular deficits noted(R oral weakness). Pt consumed po trials of Nectar liquids by TSP, purees, and single ice chips w/ No overt, clinical s/s of aspiration during po trials given support/Supervision and following aspiration precautions. Pt appears at risk  for aspiration d/t the above but when following aspiration precautions, given Supervision and support, and using a modified diet w/ Nectar liquids by TSP, the risks appear reduced somewhat.  During po trials, pt was given oral care then fed ice chips, then Nectar liquids VIA TSP and purees during his Lunch meal w/ no overt coughing, change/decline in respiratory presentation during/post trials. O2 sats remained 97-98%. Pharyngeal phase deficits include suspicion of delayed pharyngeal swallow initiation and decreased pharyngeal pressure during the swallowing as multiple swallows were noted intermittently for full clearing of boluses. Mod deficits noted during the oral phase c/b slower bolus management and bolus organization/propulsion for A-P transfer for swallowing, R lingual/lateral weakness w/ min, diffuse oral residue noted dependent on bolus consistency/texture, min oral holding w/ loss of attention to bolus, and decreased strong lingual sweeping to clear. Given Time and often alternating boluses, oral clearing achieved w/ all trials. Oral Prep stage was somewhat impacted by overall decreased awareness and required tactile/visual/verbal cues. OM Exam revealed R labial and lingual unilateral weakness w/ decreased tone and min+ lingual deviation noted; similar to previous eval. Pt required full feeding assistance and verbal/tactile cues during tasks.    Recommend a Dysphagia level 1 diet w/ Puree foods and Nectar liquids VIA TSP when fully alert/awake to engage in po's. Recommend aspiration precautions, monitor for oral clearing. NSG STAFF TO ASSESS AND FEED PT AT MEALS FOR SAFETY. Pills CRUSHED in Puree for safer, easier swallowing. Education given on Pills in Puree; food consistency and monitoring of oral clearing and swallowing w/ boluses; aspiration precautions to NSG and posted. MD/NSG updated and agreed. WIll monitor for needs and f/u w/ MBS when appropriate. SLP Visit Diagnosis: Dysphagia, oropharyngeal  phase (R13.12) (Aphasia - expressive and receptive language deficits; pt is nonverbal)    Aspiration Risk  Mild aspiration risk;Moderate aspiration risk;Risk for inadequate nutrition/hydration    Diet Recommendation  Dysphagia level 1 diet of Puree foods, gravies to moisten and Nectar liquids VIA TSP when fully alert/awake to engage in po's. Recommend aspiration precautions, monitor for oral clearing post swallows. NSG STAFF TO ASSESS AND FEED PT AT MEALS FOR SAFETY.   Medication Administration: Crushed with puree    Other  Recommendations Recommended Consults:  (Dietician f/u; Neurology f/u) Oral Care Recommendations: Oral care BID;Oral care before and after PO;Staff/trained caregiver to provide oral care Other Recommendations: Order thickener from pharmacy;Prohibited food (jello, ice cream, thin soups);Remove water pitcher;Have oral suction available   Follow up Recommendations Inpatient Rehab;Skilled Nursing facility (TBD)      Frequency and Duration min 2x/week  2 weeks       Prognosis Prognosis for Safe Diet Advancement: Fair (-Good) Barriers to Reach Goals: Cognitive deficits;Language deficits;Time post onset;Severity of deficits;Behavior      Swallow Study   General Date of Onset: 10/05/20 HPI: Pt  is a 65 y.o. African-American male with medical history significant for type II diabetes mellitus, GERD and hypertension, who presented to the emergency room with acute onset of diplopia and generalized weakness that started 8 hours prior to presentation.  The patient has been having expressive dysphasia.  His fiance who accompanied him stated that his symptoms started around midnight on 8/13 with dysarthria and diplopia but he was  more confused.  He was noted in the ER to have left facial droop and was unable to lift his left heel.  They went to [place] on Friday and he started having symptoms on Saturday night with double vision.  They went to bed at 12 midnight and at 6 AM she  noted that he knocked food on the floor and was later starting to have slurred speech.  He was feeling generally weak and unable to ambulate however without unilateral focal muscle weakness or paresthesias.  No chest pain or dyspnea or palpitations.  No cough or wheezing.  No urinary or stool incontinence.  CT Angio of head/neck: "No acute intracranial hemorrhage. Evolving areas of recent  infarction are identified in the left thalamus and left ACA  territory. No definite acute infarct.     No new large vessel occlusion. 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 Perfusion imaging demonstrates 4 mL of penumbra within the distal  left ACA territory.".  MRI: Small acute infarcts of the left paramedian frontal lobe and  ventromedial left thalamus. No hemorrhage or mass effect.  UPDATED: 10/13/2020:  Per Neuro, "3 days after admission he developed worsening aphasia and R sided weakness. Repeat MRI brain revealed extension of both his L frontal and brainstem infarcts as well as new watershed infarct in the internal border zone on the L". With this decline in status, pt was transferred to CCU over the weekend(8/20) and made NPO. Type of Study: Bedside Swallow Evaluation (repeat) Previous Swallow Assessment: 10/09/2020 Diet Prior to this Study: NPO (mech soft prior last week) Temperature Spikes Noted: No (wbc 6.0) Respiratory Status: Room air History of Recent Intubation: No Behavior/Cognition: Cooperative;Pleasant mood;Distractible;Requires cueing;Doesn't follow directions (exp/rec language deficits) Oral Cavity Assessment: Dry (sticky) Oral Care Completed by SLP: Yes Oral Cavity - Dentition: Adequate natural dentition;Missing dentition (few w/ 1 loose) Vision:  (n/a) Self-Feeding Abilities: Total assist Patient Positioning: Upright in bed (needed positioning) Baseline Vocal Quality:  (nonverbal; phonations  only) Volitional Cough: Cognitively unable to elicit Volitional Swallow: Unable to elicit    Oral/Motor/Sensory Function Overall Oral Motor/Sensory Function: Moderate impairment Facial ROM: Reduced right;Suspected CN VII (facial) dysfunction Facial Symmetry: Abnormal symmetry right;Suspected CN VII (facial) dysfunction Facial Strength: Reduced right;Suspected CN VII (facial) dysfunction Facial Sensation: Reduced right;Suspected CN V (Trigeminal) dysfunction Lingual ROM: Reduced right;Suspected CN XII (hypoglossal) dysfunction Lingual Symmetry: Abnormal symmetry right;Suspected CN XII (hypoglossal) dysfunction (min+) Lingual Strength: Reduced;Suspected CN XII (hypoglossal) dysfunction Velum:  (CNT) Mandible: Within Functional Limits (to resistance)   Ice Chips Ice chips: Impaired Presentation: Spoon (4 trials) Oral Phase Impairments: Reduced lingual movement/coordination;Poor awareness of bolus Oral Phase Functional Implications: Prolonged oral transit Pharyngeal Phase Impairments: Suspected delayed Swallow (no overt s/s of aspriation noted) Other Comments: slow motor movements   Thin Liquid Thin Liquid: Not tested    Nectar Thick Nectar Thick Liquid: Impaired (min) Presentation: Spoon (~3-4 ozs) Oral Phase Impairments: Reduced lingual movement/coordination;Poor awareness of bolus Oral phase functional implications: Prolonged oral transit Pharyngeal Phase Impairments: Suspected delayed Swallow;Multiple swallows (no overt s/s of aspiration noted) Other Comments: slow motor movements   Honey Thick Honey Thick Liquid: Not tested   Puree Puree: Impaired (min) Presentation: Spoon (~4 ozs) Oral Phase Impairments: Poor awareness of bolus;Reduced lingual movement/coordination Oral Phase Functional Implications: Prolonged oral transit Pharyngeal Phase Impairments: Suspected delayed Swallow;Multiple swallows (no overt s/s of aspiration noted) Other Comments: slow motor movements  Solid      Solid: Not tested        Jerilynn Som, MS, CCC-SLP Speech Language Pathologist Rehab Services [phone] [place] 10/13/2020,3:26 PM

## 2020-10-13 NOTE — Progress Notes (Signed)
Inpatient Rehabilitation Admissions Coordinator   PT and OT have signed off therefore we will as well. Please re consult when appropriate.  Ottie Glazier, RN, MSN Rehab Admissions Coordinator [phone] 10/13/2020 8:45 AM

## 2020-10-13 NOTE — Progress Notes (Signed)
Patient tolerated CPAP through the night.
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
## 2020-10-13 NOTE — Progress Notes (Signed)
NIHSS was 19. Patient is aphasic and unable to communicate needs. All care needs anticipated. Safety maintained, intentional rounding completed. Patient slept through the night. Awakened minimally for vital signs, turning and repositioning. Will endorse.

## 2020-10-13 NOTE — Progress Notes (Signed)
OT Cancellation Note  Patient Details Name: Avonte Sensabaugh MRN: [id number] DOB: 1955/10/31   Cancelled Treatment:    Reason Eval/Treat Not Completed: Medical issues which prohibited therapy. Per chart review, pt has transitioned to higher level care. Per therapy protocols, pt will require new orders to continue therapy services. OT to sign off at this time. Please re-consult when pt is medically appropriate.  Matthew Folks, OTR/L [facility] [phone]

## 2020-10-14 LAB — BASIC METABOLIC PANEL
Anion gap: 7 (ref 5–15)
BUN: 11 mg/dL (ref 8–23)
CO2: 25 mmol/L (ref 22–32)
Calcium: 8.8 mg/dL — ABNORMAL LOW (ref 8.9–10.3)
Chloride: 103 mmol/L (ref 98–111)
Creatinine, Ser: 1.05 mg/dL (ref 0.61–1.24)
GFR, Estimated: >60 mL/min (ref >60)
Glucose, Bld: 184 mg/dL — ABNORMAL HIGH (ref 70–99)
Potassium: 3.8 mmol/L (ref 3.5–5.1)
Sodium: 135 mmol/L (ref 135–145)

## 2020-10-14 LAB — GLUCOSE, CAPILLARY
Glucose-Capillary: 170 mg/dL — ABNORMAL HIGH (ref 70–99)
Glucose-Capillary: 173 mg/dL — ABNORMAL HIGH (ref 70–99)
Glucose-Capillary: 139 mg/dL — ABNORMAL HIGH (ref 70–99)
Glucose-Capillary: 202 mg/dL — ABNORMAL HIGH (ref 70–99)
Glucose-Capillary: 180 mg/dL — ABNORMAL HIGH (ref 70–99)
Glucose-Capillary: 195 mg/dL — ABNORMAL HIGH (ref 70–99)

## 2020-10-14 MED ORDER — NEPRO/CARBSTEADY PO LIQD
237.0000 mL | Freq: Three times a day (TID) | ORAL | Status: DC
  Administered 2020-10-14 – 2020-10-24 (×25): 237 mL via ORAL

## 2020-10-14 NOTE — Evaluation (Signed)
Occupational Therapy Re-evaluation Patient Details Name: Raymond Kerr MRN: [id number] DOB: [birth year] Today's Date: 10/14/2020    History of Present Illness Pt is a 65 yo M w/ PMH of HTN, DM2, HL who presented with multiple neurologic sx and was found to have 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 3 days after admission he developed worsening aphasia and R sided weakness, transferred to ICU and underwent repeat imaging. Repeat MRI brain revealed extension of both his L frontal and brainstem infarcts as well as new watershed infarct in the internal border zone on the L. (per chart). Seen as a PT re-evaluation.   Clinical Impression   Pt seen for OT re-evaluation on this date following transfer to ICU. Upon arrival to room, pt awake and seated in bed in high fowler's position. Pt is non-verbal for the majority of communication and only responding to ~10% of Y/N questions via head shaking/nodding. Attempted to have pt answer Y/N questions by squeezing OT's hand, but pt inconsistently giving responses. With MIN proximal joint support and washcloth placed in L hand, pt able bring LUE to wash face with washcloth however requires MOD A for thoroughness. Pt requires cues for initiating task and continuing to attend to task. With HHA to bring RUE to midline, pt able to wash RUE with washcloth and apply lotion. Pt continues to benefit from skilled OT services to maximize independence with ADLs/functional mobility and minimize risk of future falls, injury, caregiver burden, and readmission. Upon discharge, recommend SNF.    Follow Up Recommendations  SNF    Equipment Recommendations  Other (comment) (defer to next venue of care)       Precautions / Restrictions Precautions Precautions: Fall Restrictions Weight Bearing Restrictions: No      Mobility Bed Mobility Overal bed mobility: Needs Assistance    General bed mobility comments: deferred d/t decreased  command follow    Transfers                 General transfer comment: Inappropriate at this time        ADL either performed or assessed with clinical judgement   ADL Overall ADL's : Needs assistance/impaired     Grooming: Wash/dry face;Wash/dry hands;Moderate assistance;Bed level Grooming Details (indicate cue type and reason): With MIN proximal joint support and washcloth placed in L hand, pt able bring LUE to wash face with washcloth however requires MOD A for thoroughness. Cues for initiating task and continuing to attend to task. With HHA to bring RUE to midline, pt able to wash RUE with washcloth and apply lotion                                      Pertinent Vitals/Pain Pain Assessment: No/denies pain Faces Pain Scale: No hurt Pain Intervention(s): Monitored during session     Hand Dominance Right   Extremity/Trunk Assessment Upper Extremity Assessment Upper Extremity Assessment: RUE deficits/detail;LUE deficits/detail RUE Deficits / Details: Unable to initiate movement throughout RUE. Unable to test sensation d/t cognition  LUE Deficits / Details: Able to flex shoulder to ~80 degrees. Elbow strength grossly 3+/5, grip strength 4/5. Unable to test sensation d/t cognition   Lower Extremity Assessment Lower Extremity Assessment: Defer to PT evaluation      Communication Communication Communication: Expressive difficulties (only responding to ~10% of Y/N questions via head shaking/nodding. Attempted to have pt answer Y/N questions by squeezing  OT's hand, but pt inconsistently giving responses)   Cognition Arousal/Alertness: Lethargic;Awake/alert (drowzy at times, alternating between awake and eyes closed) Behavior During Therapy: Flat affect Overall Cognitive Status: Impaired/Different from baseline Area of Impairment: Orientation;Problem solving                 Orientation Level: Person           Problem Solving: Slow  processing;Decreased initiation;Requires tactile cues;Requires verbal cues General Comments: Pt is non-verbal for the majority of communication. Only responding to ~10% of Y/N questions via head shaking/nodding. 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 The home is a single-story home without steps to enter. Pt's friend indicates his daugther is disabled and unable to physically assist with any mobility.  Lives With: Daughter    Prior Functioning/Environment Level of Independence: Independent        Comments: Pt is a independent with ADLs, IADLs and intermittently unloads trunks but mainly drives.        OT Problem List: Decreased strength;Decreased coordination;Decreased cognition;Decreased safety awareness;Decreased knowledge of use of DME or AE;Impaired balance (sitting and/or standing);Impaired vision/perception;Impaired UE functional use;Decreased activity tolerance;Decreased range of motion      OT Treatment/Interventions: Self-care/ADL training;Therapeutic exercise;Therapeutic activities;Cognitive remediation/compensation;Neuromuscular education;Visual/perceptual remediation/compensation;DME and/or AE instruction;Balance training;Patient/family education    OT Goals(Current goals can be found in the care plan section) Acute Rehab OT Goals Patient Stated Goal: To feel better OT Goal Formulation: Patient unable to participate in goal setting Time For Goal Achievement:  10/28/20 ADL Goals Pt Will Perform Grooming: with min assist;bed level Additional ADL Goal #1: Pt will follow 50% of 1-step commands during bed-level UB ADLs  OT Frequency: Min 2X/week    AM-PAC OT "6 Clicks" Daily Activity     Outcome Measure Help from another person eating meals?: A Lot Help from another person taking care of personal grooming?: A Lot Help from another person toileting, which includes using toliet, bedpan, or urinal?: Total Help from another person bathing (including washing, rinsing, drying)?: A Lot Help from another person to put on and taking off regular upper body clothing?: A Lot Help from another person to put on and taking off regular lower body clothing?: Total 6 Click Score: 10   End of Session Nurse Communication: Mobility status  Activity Tolerance: Patient tolerated treatment well Patient left: in bed;with call bell/phone within reach;with bed alarm set  OT Visit Diagnosis: Other abnormalities of gait and mobility (R26.89);Hemiplegia and hemiparesis Hemiplegia - Right/Left: Right Hemiplegia - dominant/non-dominant: Dominant Hemiplegia - caused by: Cerebral infarction                Time: 1350-1412 OT Time Calculation (min): 22 min Charges:  OT General Charges $OT Visit: 1 Visit OT Evaluation $OT Eval Moderate Complexity: 1 Mod OT Treatments $Self Care/Home Management : 8-22 mins  Matthew Folks, OTR/L [facility] [phone]

## 2020-10-14 NOTE — Progress Notes (Signed)
PROGRESS NOTE    Raymond Kerr  PVV:[id number] DOB: [birth year] DOA: 10/06/2020 PCP: Raymond Kerr, Raymond Kerr    Brief Narrative:  Raymond Kerr is a 65 y.o. African-American male with medical history significant for type II diabetes mellitus, GERD and hypertension, who presented to the emergency room with acute onset of diplopia and generalized weakness that started 8 hours prior to presentation.  The patient has been having expressive dysphasia. On arrival to the emergency room here it appears that the patient has left-sided weakness and difficulty with speech.  MRI of the brain showed small acute infarcts of the left paramedian frontal lobe and ventromedial left thalamus. Neurology consult has been obtained. MRA head showed a short segment of the left ACA A2 segment.  8/17-8/23 week: Patient had TEE negative for clot. pt had worsening of right-sided weakness and neurology was notied.   Code stroke was called.  Patient underwent CTA.  Neurology saw patient and felt this was symptomatic relative to hypotension in the setting of multifocal intracranial stenosis.  His losartan was discontinued and was given IV bolus fluid.started on midodrine. 8/19 received message from nursing patient with acute unresponsiveness.   patient did not respond and was aphasic.  CT scan of head was ordered stat without any new acute findings.Pt was transferred to ICU for closer monitoring. Has been NPO until 8/23 started on dysphagia diet. Monitoring po intake for the next couple of days, if not improved, may need to consider PEG placement .     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  Acute left ACA ischemic stroke. Neurology following TEE negative for thrombus in the  left atrial/left atrial appendage.  EF 50 to 55% CT head today with no acute intracranial hemorrhage for this morning's event. Today's event likely due to relative hypotension in the setting of multifocal intracranial stenosis.  Losartan discontinued.  IV fluid bolus was ordered. 8/19-repeat MRI from yesterday -Multifocal acute ischemia in the left hemisphere and left brainstem, slightly worsened since 10/06/2020 Bp stable, but will dc amlodipine. Increased midodrine to 5mg  tid to avoid any hypotension, neurology also agreeable.  Stat CT no acute change Permissive htn up to sbp 220; iv hydralazine for bp outside parameters Continue ivf to maintain sbp >135 Continue asa 81mg  +plavix 75mg  qd x90 days given severe intracranial stenosis, f/b asa 81mg  qd after that. Atorvastatin 80mg  qd 8/23- continue tele. No afib so far Will need ambulatory referral to neurology upon discharge.  Will need Zio patch prior to DC to rule out embolic source. SPL following started on dysphagia diet 's are pending when medically stable EEG obtained with moderate diffuse slowing indicating global cerebral dysfunction.  Focal slowing over the left frontal region indicating focal dysfunction in that area consistent with known acute infarct.  There was no electrographic seizures or other epileptiform abnormalities observed during the recording. Stat head CT any change in neuro exam Neurology signed off, reconsult if any change     2.  Type 2 diabetes. BG stable Continue IV fluid but decrease rate to 50 MLS per hour until takes more p.o. intake Continue R-ISS    3.  Hypokalemia. Supplemented and stable 4.hyponatremia- mild 132>>>135 Due to hypovolemia Improved with IV fluids  DVT prophylaxis: Lovenox Code Status: full Family Communication: none Disposition Plan:    Status is: Inpatient  Remains inpatient appropriate because:Inpatient level of care appropriate due to severity of illness  Dispo: The  patient is from: Home              Anticipated d/c is to:  CIR              Patient currently is not medically stable to d/c.   Difficult to place patient No        I/O last 3 completed shifts: In: 2953.5 [P.O.:240; I.V.:2713.5] Out: 1350 [Urine:1350] No intake/output data recorded.     Consultants:  Neurology  Procedures: None  Antimicrobials: None   Subjective: Following commands.  Working with PT sitting in bed with their help.  Looks a little better than yesterday from neuro standpoint as far as his alertness and following commands today.  Objective: Vitals:   10/14/20 0400 10/14/20 0500 10/14/20 0600 10/14/20 0700  BP: (!) 144/72 (!) 155/86 137/78 (!) 145/96  Pulse: 73 70 74 72  Resp:      Temp: 98.2 F (36.8 C)     TempSrc: Oral     SpO2: 97% 98% 100% 99%  Weight:      Height:        Intake/Output Summary (Last 24 hours) at 10/14/2020 0902 Last data filed at 10/14/2020 0600 Gross per 24 hour  Intake 2108.84 ml  Output 900 ml  Net 1208.84 ml   Filed Weights   10/05/20 1950 10/06/20 2045 10/13/20 0624  Weight: 118.8 kg 120.4 kg 121.3 kg    Examination: Nad, calm, working with PT Cta no w/r Regular s1/s2 no gallop Soft benign +bs +edema Tries to follow my finger with his eye. Rt droop. Does not respond to my questions LT side ext. X2 moving. Rt side flaccid for me  Data Reviewed: I have personally reviewed following labs and imaging studies  CBC: Recent Labs  Lab 10/09/20 0955 10/10/20 0350  WBC 8.2 6.0  HGB 12.9* 12.3*  HCT 38.4* 37.1*  MCV 89.1 89.4  PLT 271 255   Basic Metabolic Panel: Recent Labs  Lab 10/09/20 0955 10/11/20 0949 10/14/20 0533  NA 132* 135 135  K 3.7  --  3.8  CL 104  --  103  CO2 24  --  25  GLUCOSE 108*  --  184*  BUN 11  --  11  CREATININE 1.06  --  1.05  CALCIUM 8.9  --  8.8*   GFR: Estimated Creatinine Clearance: 94.3 mL/min (by C-G formula based on SCr of 1.05 mg/dL). Liver Function Tests: No  results for input(s): AST, ALT, ALKPHOS, BILITOT, PROT, ALBUMIN in the last 168 hours.  No results for input(s): LIPASE, AMYLASE in the last 168 hours. No results for input(s): AMMONIA in the last 168 hours. Coagulation Profile: No results for input(s): INR, PROTIME in the last 168 hours.  Cardiac Enzymes: No results for input(s): CKTOTAL, CKMB, CKMBINDEX, TROPONINI in the last 168 hours. BNP (last 3 results) No results for input(s): PROBNP in the last 8760 hours. HbA1C: No results for input(s): HGBA1C in the last 72 hours.  CBG: Recent Labs  Lab 10/13/20 1630 10/13/20 1919 10/13/20 2317 10/14/20 0343 10/14/20 0715  GLUCAP 162* 184* 165* 170* 173*   Lipid Profile: No results for input(s): CHOL, HDL, LDLCALC, TRIG, CHOLHDL, LDLDIRECT in the last 72 hours.  Thyroid Function Tests: No results for input(s): TSH, T4TOTAL, FREET4, T3FREE, THYROIDAB  in the last 72 hours. Anemia Panel: No results for input(s): VITAMINB12, FOLATE, FERRITIN, TIBC, IRON, RETICCTPCT in the last 72 hours. Sepsis Labs: No results for input(s): PROCALCITON, LATICACIDVEN in the last 168 hours.  Recent Results (from the past 240 hour(s))  Resp Panel by RT-PCR (Flu A&B, Covid) Nasopharyngeal Swab     Status: None   Collection Time: 10/06/20  1:23 AM   Specimen: Nasopharyngeal Swab; Nasopharyngeal(NP) swabs in vial transport medium  Result Value Ref Range Status   SARS Coronavirus 2 by RT PCR NEGATIVE NEGATIVE Final    Comment: (NOTE) SARS-CoV-2 target nucleic acids are NOT DETECTED.  The SARS-CoV-2 RNA is generally detectable in upper respiratory specimens during the acute phase of infection. The lowest concentration of SARS-CoV-2 viral copies this assay can detect is 138 copies/mL. A negative result does not preclude SARS-Cov-2 infection and should not be used as the sole basis for treatment or other patient management decisions. A negative result may occur with  improper specimen collection/handling,  submission of specimen other than nasopharyngeal swab, presence of viral mutation(s) within the areas targeted by this assay, and inadequate number of viral copies(<138 copies/mL). A negative result must be combined with clinical observations, patient history, and epidemiological information. The expected result is Negative.  Fact Sheet for Patients:  BloggerCourse.comhttps://www.fda.gov/media/152166/download  Fact Sheet for Healthcare Providers:  SeriousBroker.ithttps://www.fda.gov/media/152162/download  This test is no t yet approved or cleared by the [place] FDA and  has been authorized for detection and/or diagnosis of SARS-CoV-2 by FDA under an Emergency Use Authorization (EUA). This EUA will remain  in effect (meaning this test can be used) for the duration of the COVID-19 declaration under Section 564(b)(1) of the Act, 21 U.S.C.section 360bbb-3(b)(1), unless the authorization is terminated  or revoked sooner.       Influenza A by PCR NEGATIVE NEGATIVE Final   Influenza B by PCR NEGATIVE NEGATIVE Final    Comment: (NOTE) The Xpert Xpress SARS-CoV-2/FLU/RSV plus assay is intended as an aid in the diagnosis of influenza from Nasopharyngeal swab specimens and should not be used as a sole basis for treatment. Nasal washings and aspirates are unacceptable for Xpert Xpress SARS-CoV-2/FLU/RSV testing.  Fact Sheet for Patients: BloggerCourse.comhttps://www.fda.gov/media/152166/download  Fact Sheet for Healthcare Providers: SeriousBroker.ithttps://www.fda.gov/media/152162/download  This test is not yet approved or cleared by the [place] FDA and has been authorized for detection and/or diagnosis of SARS-CoV-2 by FDA under an Emergency Use Authorization (EUA). This EUA will remain in effect (meaning this test can be used) for the duration of the COVID-19 declaration under Section 564(b)(1) of the Act, 21 U.S.C. section 360bbb-3(b)(1), unless the authorization is terminated or revoked.  Performed at [facility], [address] [place], [place] [address]   MRSA Next Gen by PCR, Nasal     Status: Abnormal   Collection Time: 10/12/20  8:28 AM   Specimen: Nasal Mucosa; Nasal Swab  Result Value Ref Range Status   MRSA by PCR Next Gen NEGATIVE (A) NOT DETECTED Final    Comment: Performed at [facility], [address] [place], [place] [address]         Radiology Studies: No results found.      Scheduled Meds:   stroke: mapping our early stages of recovery book   Does not apply Once   aspirin  81 mg Oral Daily   atorvastatin  80 mg Oral Daily   Chlorhexidine Gluconate Cloth  6 each Topical Daily   clopidogrel  75 mg Oral  Daily   enoxaparin (LOVENOX) injection  0.5 mg/kg Subcutaneous Q24H   insulin aspart  0-15 Units Subcutaneous Q4H   midodrine  5 mg Oral TID WC   Continuous Infusions:  dextrose 5 % and 0.45% NaCl 75 mL/hr at 10/14/20 0600     LOS: 7 days    Time spent: with more than 50% on COC    Raymond Ito, Raymond Kerr [facility]   To contact the attending provider between 7A-7P or the covering provider during after hours 7P-7A, please log into the web site [email or web address] and access using universal [place] password for that web site. If you do not have the password, please call the hospital operator.  10/14/2020, 9:02 AM

## 2020-10-14 NOTE — Evaluation (Addendum)
Physical Therapy Re-Evaluation Patient Details Name: Raymond Kerr MRN: [id number] DOB: 1955/04/22 Today's Date: 10/14/2020   History of Present Illness  Pt is a 65 yo M w/ PMH of HTN, DM2, HL who presented with multiple neurologic sx and was found to have 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 3 days after admission he developed worsening aphasia and R sided weakness, transferred to ICU and underwent repeat imaging. Repeat MRI brain revealed extension of both his L frontal and brainstem infarcts as well as new watershed infarct in the internal border zone on the L. (per chart). Seen as a PT re-evaluation.  Clinical Impression  Pt in bed, fatigued upon entry, but easily awoken with verbal and tactile cues. Pt is largely non-verbal, but able to state his first name. He is able to follow simple commands with increased time for processing. Pt was able to squeeze PT's hand for responding to yes or no questions. Pt is unable to initiate movement on RUE, RLE, responds to noxious stimuli or RLE only, absent RUE. Both LUE, LLE are able to move against gravity, and pt is able to particpate in therapy session utilizing left extremities.   Pt is Max-A for all bed mobility including rolling, supine <>sit. W/ +2 physical assist. He was able to participate in seated righting reactions (little to none noted) and seated balance exercises. Able to maintain midline positioning when placed there, with fatigue R lean noted. Pt is able to participate with multi-modal cues. Blood pressure monitored throughout session with no signs of hypotension during treatment. Skilled PT intervention is indicated to address deficits in function, mobility, and to return to PLOF as able.  Recommendation is SNF at this time due to acute decline in functional status and current level of assistance needed.   Follow Up Recommendations SNF    Equipment Recommendations  Other (comment) (TBD next venue of  care)    Recommendations for Other Services OT consult     Precautions / Restrictions Precautions Precautions: Fall Restrictions Weight Bearing Restrictions: No      Mobility  Bed Mobility Overal bed mobility: Needs Assistance Bed Mobility: Rolling;Supine to Sit;Sit to Supine Rolling: Max assist;+2 for physical assistance   Supine to sit: Max assist;+2 for physical assistance Sit to supine: Max assist;+2 for physical assistance   General bed mobility comments: Pt is able to utilize LUE, LLE with simple multi-modal cues for task initiation    Transfers                 General transfer comment: Inappropriate at this time  Ambulation/Gait             General Gait Details: Inappropriate at this time  Stairs            Wheelchair Mobility    Modified Rankin (Stroke Patients Only)       Balance Overall balance assessment: Needs assistance Sitting-balance support: Single extremity supported Sitting balance-Leahy Scale: Fair Sitting balance - Comments: Pt is able to support body weight with LUE, does not respond to post/ant/lateral challenges       Standing balance comment: Inappropriate at this time                             Pertinent Vitals/Pain Pain Assessment: Faces Faces Pain Scale: No hurt Pain Intervention(s): Monitored during session    Home Living Family/patient expects to be discharged to:: Private residence Living Arrangements: Non-relatives/Friends  Available Help at Discharge: Other (Comment) (No assistance is available) Type of Home: House Home Access: Level entry         Additional Comments: 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 The home is a single-story home without steps to enter. Pt's friend indicates his daugther is disabled and unable to physically assist with any mobility.    Prior Function Level of Independence: Independent          Comments: Pt is a independent with ADLs, IADLs and intermittently unloads trunks but mainly drives.     Hand Dominance   Dominant Hand: Right    Extremity/Trunk Assessment   Upper Extremity Assessment Upper Extremity Assessment: RUE deficits/detail;LUE deficits/detail RUE Deficits / Details: Unable to initiate movement throughout RUE RUE Sensation:  (Does not withdraw to noxious stimuli) LUE Deficits / Details: Able to raise UE to 90 degrees, grip strength: excellent    Lower Extremity Assessment Lower Extremity Assessment: RLE deficits/detail;LLE deficits/detail RLE Deficits / Details: Pt is unable to initiate movement throughout RLE RLE Sensation:  (withdraws to noxious stimuli) LLE Deficits / Details: Formal strength testing deferred (pt was unable to follow commands for holding), able to extend knee against gravity, abd/add hip w/ gravity LLE Sensation:  (Pt is largely nonverbal, withdrew foot to moderate touch to plantar aspect)    Cervical / Trunk Assessment Cervical / Trunk Assessment: Kyphotic  Communication   Communication: Expressive difficulties  Cognition Arousal/Alertness: Awake/alert Behavior During Therapy: Flat affect;WFL for tasks assessed/performed Overall Cognitive Status: Impaired/Different from baseline Area of Impairment: Orientation;Problem solving                 Orientation Level: Person           Problem Solving: Slow processing;Decreased initiation;Requires tactile cues;Requires verbal cues General Comments: Pt is non-verbal for the majority of communication. Simple yes, no questions necessary; pt responding with gripping PT hand for tactile response to questions      General Comments      Exercises Other Exercises Other Exercises: Rolling x 4 due to BM, PT performed pericare, linen change. Pt is able to utilize LUE, LLE with simple multi-modal cues for task initiation, MAX-A for assistance Other Exercises: Static  sitting 5 minutes, vitals appropriate w/ BP > 135/80   Assessment/Plan    PT Assessment Patient needs continued PT services  PT Problem List Decreased strength;Decreased range of motion;Decreased activity tolerance;Decreased balance;Decreased mobility;Decreased coordination;Decreased cognition;Decreased safety awareness;Decreased knowledge of precautions       PT Treatment Interventions Gait training;Balance training;Neuromuscular re-education;Stair training;Functional mobility training;Therapeutic activities;Therapeutic exercise;Cognitive remediation    PT Goals (Current goals can be found in the Care Plan section)  Acute Rehab PT Goals Patient Stated Goal: To feel better PT Goal Formulation: With patient Time For Goal Achievement: 10/28/20 Potential to Achieve Goals: Fair    Frequency 7X/week   Barriers to discharge Inaccessible home environment      Co-evaluation               AM-PAC PT "6 Clicks" Mobility  Outcome Measure Help needed turning from your back to your side while in a flat bed without using bedrails?: A Lot Help needed moving from lying on your back to sitting on the side of a flat bed without using bedrails?: A Lot Help needed moving to and from a bed to a chair (including a wheelchair)?: Total Help needed standing up from a  chair using your arms (e.g., wheelchair or bedside chair)?: Total Help needed to walk in hospital room?: Total Help needed climbing 3-5 steps with a railing? : Total 6 Click Score: 8    End of Session   Activity Tolerance: Patient tolerated treatment well Patient left: in bed;with bed alarm set Nurse Communication: Mobility status PT Visit Diagnosis: Other abnormalities of gait and mobility (R26.89);Muscle weakness (generalized) (M62.81);Other symptoms and signs involving the nervous system (R29.898);Hemiplegia and hemiparesis;Difficulty in walking, not elsewhere classified (R26.2) Hemiplegia - Right/Left: Right Hemiplegia -  caused by: Cerebral infarction    Time: 0973-5329 PT Time Calculation (min) (ACUTE ONLY): 49 min   Charges:             [employer], SPT

## 2020-10-14 NOTE — Progress Notes (Signed)
Initial Nutrition Assessment  DOCUMENTATION CODES:  Obesity unspecified  INTERVENTION:  Continue current diet as ordered per SLP recommendation Nepro Shake po TID, each supplement provides 425 kcal and 19 grams protein Magic cup TID with meals, each supplement provides 290 kcal and 9 grams of protein Pt would benefit from PEG placement to allow him to meet nutrition needs while continuing to work on regaining functional status  NUTRITION DIAGNOSIS:  Inadequate oral intake related to  (decreased functional status s/p CVA) as evidenced by meal completion < 50% (and pt requiring full assistance with feeding).  GOAL:  Patient will meet greater than or equal to 90% of their needs  MONITOR:  PO intake, Labs, Weight trends  REASON FOR ASSESSMENT:  Rounds (per SLP request - at risk for not meeting nutrition needs)    ASSESSMENT:  65 y.o. African-American male with medical history significant for type II DM, GERD, and HTN presented to the ED with acute onset of generalized weakness. Steffanie Rainwater reports pt has also been having expressive dysphasia with dysarthria and diplopia since 8/13.  Imaging in ED positive for small acute infarct of the left paramedian frontal lobe and ventral medial left thalamus with short segment occlusion of the left ACA A2 segment and moderate narrowing of the right ACA proximal A3 segment. Neurology consult placed.  Pt had a change in status 8/18 and code stroke was activated but no acute changes seen on imaging. Pt less responsive on 8/19. Moved to ICU for close monitoring. Pt made NPO by SLP 8/19 but diet able to be advanced 8/22 to DYS 1, nectar thick. Discussed with SLP, will likely not be able to upgrade from current consistency in the near future.   Pt resting in bed at the time of visit, finishing up working with SLP and lunch. Did not respond to nutrition questions and mostly kept eyes closed. Significant amount of edema felt throughout body on exam. Skin is tight  and may be masking signs of muscle and fat depletions. IVF infusing at 4mL/h providing some nutrition (306 kcal/d)  Noted that pt consumed about 25% of meal. SLP reports that pt required full assistance with feeding and also had to have distractions minimized along with encouragement. Reports it took pt ~20 minutes to consume the small amount of his meal.  PT worked with pt earlier today and recommends pt be dc to inpatient rehab.   Based on pt's needs and current inability to consume adequate nutrition, would highly recommend consideration of PEG placement so that pt's nutrition status can be supported while working on recovering functional status.   Average Meal Intake: 8/16-8/22: 75% intake x 6 recorded meals (50-100%)  Nutritionally Relevant Medications: Scheduled Meds:  atorvastatin  80 mg Oral Daily   insulin aspart  0-15 Units Subcutaneous Q4H   midodrine  5 mg Oral TID WC   Continuous Infusions:  dextrose 5 % and 0.45% NaCl 75 mL/hr at 10/14/20 0600   PRN Meds: magnesium hydroxide, ondansetron, sennosides  Labs Reviewed: SBG ranges from 139-184 mg/dL over the last 24 hours HgbA1c 10.1% (8/15)   Intake/Output Summary (Last 24 hours) at 10/14/2020 1354 Last data filed at 10/14/2020 0600 Gross per 24 hour  Intake 1576.62 ml  Output 900 ml  Net 676.62 ml  Net IO Since Admission: -5,754.33 mL [10/14/20 1354]  NUTRITION - FOCUSED PHYSICAL EXAM: Flowsheet Row Most Recent Value  Orbital Region No depletion  Upper Arm Region No depletion  Thoracic and Lumbar Region No depletion  Buccal  Region No depletion  Temple Region No depletion  Clavicle Bone Region No depletion  Clavicle and Acromion Bone Region No depletion  Scapular Bone Region No depletion  Dorsal Hand No depletion  Patellar Region No depletion  Anterior Thigh Region No depletion  Posterior Calf Region No depletion  Edema (RD Assessment) Severe  Hair Reviewed  Eyes Reviewed  Mouth Reviewed  Skin Reviewed   Nails Reviewed   Diet Order:   Diet Order             DIET - DYS 1 Room service appropriate? Yes with Assist; Fluid consistency: Nectar Thick  Diet effective now                  EDUCATION NEEDS:  Not appropriate for education at this time  Skin:  Skin Assessment: Reviewed RN Assessment  Last BM:  8/22 - type 6  Height:  Ht Readings from Last 1 Encounters:  10/06/20 6' (1.829 m)   Weight:  Wt Readings from Last 1 Encounters:  10/13/20 121.3 kg   Ideal Body Weight:  80.9 kg  BMI:  Body mass index is 36.27 kg/m.  Estimated Nutritional Needs:  Kcal:  2300-2500 kcal/d Protein:  115-130g/d Fluid:  2.2-2.5 L/d  Greig Castilla, RD, LDN Clinical Dietitian Pager on [employer]

## 2020-10-14 NOTE — Progress Notes (Signed)
Speech Language Pathology Treatment: Dysphagia  Patient Details Name: Raymond Kerr MRN: [id number] DOB: [birth year] Today's Date: 10/14/2020 Time: 4081-4481 SLP Time Calculation (min) (ACUTE ONLY): 40 min  Assessment / Plan / Recommendation Clinical Impression  Pt seen for ongoing assessment of his dysphagia in light of recent decline in Neurological status post admit and transfer to CCU. Pt is nonverbal w/ apparent Receptive and Expressive Aphasia, possible Apraxia which have worsened since initial eval post admit. Per Neuro, "3 days after admission, he developed worsening aphasia and R sided weakness. Repeat MRI brain revealed extension of both his L frontal and brainstem infarcts as well as new watershed infarct in the internal border zone on the L". Pt was nonverbal at this session but gave 2 phonations to offering of certain foods from meal.    At this session, pt appears to present w/ ongoing Mod oropharyngeal phase dysphagia w/ added impact from intermittent Drowsy State on his swallow function and his overall awareness/alertness for po tasks. Oropharyngeal phase deficits noted w/ neuromuscular deficits noted(R oral weakness). Pt consumed po trials of Nectar liquids by TSP, purees, and single ice chips w/ No overt, clinical s/s of aspiration during po trials given support/Supervision and following aspiration precautions. Pt appears at risk for aspiration d/t the above but when following aspiration precautions, given Supervision and support, and using a modified diet w/ Nectar liquids by TSP, the risks appear reduced somewhat.  During po trials, pt was given oral care then fed ice chips, then Nectar liquids VIA TSP and purees during his Lunch meal w/ no overt coughing, change/decline in respiratory presentation during/post trials. O2 sats remained 98%. Pharyngeal phase deficits include suspicion of delayed pharyngeal swallow initiation and decreased pharyngeal pressure during the swallowing as  multiple swallows were noted intermittently for full clearing of boluses. Mod deficits noted during the oral phase c/b slower bolus management and bolus organization/propulsion for A-P transfer for swallowing, R lingual/lateral weakness w/ min, diffuse oral residue noted dependent on bolus consistency/texture, min oral holding w/ loss of attention to bolus, and decreased strong lingual sweeping to clear. Given Time and often alternating boluses, oral clearing achieved w/ all trials. Oral Prep stage was somewhat impacted by overall decreased awareness and required tactile/visual/verbal cues.  Pt required full feeding assistance and verbal/tactile cues during tasks. Pt consumed ~25% of the Lunch meal requiring ~20+ mins; rest break then further po's fed to him post break.  Pt appears at increased risk for aspiration as well as not being able to meet his nutritional needs fully d/t the degree of Oropharyngeal phase Dysphagia at this time. It needs to be considered that support by alternative means of feeding(PEG) could better meet his needs allowing him to focus on therapeutic po tasks w/ ST services. This was discussed w/ MD and Dietician.    Recommend continue a Dysphagia level 1 diet w/ Puree foods and Nectar liquids VIA TSP when fully alert/awake to engage in po's. Recommend aspiration precautions, monitor for oral clearing. NSG STAFF TO ASSESS AND FEED PT AT MEALS FOR SAFETY. Pills CRUSHED in Puree for safer, easier swallowing. Education given on Pills in Puree; food consistency and monitoring of oral clearing and swallowing w/ boluses; aspiration precautions to NSG and posted. MD/NSG updated and agreed. WIll monitor for needs and f/u w/ MBS when appropriate.       HPI HPI: Pt  is a 65 y.o. African-American male with medical history significant for type II diabetes mellitus, GERD and hypertension, who presented to the  emergency room with acute onset of diplopia and generalized weakness that started 8  hours prior to presentation.  The patient has been having expressive dysphasia.  His fiance who accompanied him stated that his symptoms started around midnight on 8/13 with dysarthria and diplopia but he was more confused.  He was noted in the ER to have left facial droop and was unable to lift his left heel.  They went to [place] on Friday and he started having symptoms on Saturday night with double vision.  They went to bed at 12 midnight and at 6 AM she noted that he knocked food on the floor and was later starting to have slurred speech.  He was feeling generally weak and unable to ambulate however without unilateral focal muscle weakness or paresthesias.  No chest pain or dyspnea or palpitations.  No cough or wheezing.  No urinary or stool incontinence.  CT Angio of head/neck: "No acute intracranial hemorrhage. Evolving areas of recent  infarction are identified in the left thalamus and left ACA  territory. No definite acute infarct.     No new large vessel occlusion. 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 Perfusion imaging demonstrates 4 mL of penumbra within the distal  left ACA territory.".  MRI: Small acute infarcts of the left paramedian frontal lobe and  ventromedial left thalamus. No hemorrhage or mass effect.  UPDATED: 10/13/2020:  Per Neuro, "3 days after admission he developed worsening aphasia and R sided weakness. Repeat MRI brain revealed extension of both his L frontal and brainstem infarcts as well as new watershed infarct in the internal border zone on the L". With this decline in status, pt was transferred to CCU over the weekend(8/20) and made NPO.      SLP Plan  Continue with current plan of care       Recommendations  Diet recommendations: Dysphagia 1 (puree);Nectar-thick liquid Liquids provided via: Teaspoon Medication Administration: Crushed with puree Supervision: Staff  to assist with self feeding;Full supervision/cueing for compensatory strategies Compensations: Minimize environmental distractions;Slow rate;Small sips/bites;Lingual sweep for clearance of pocketing;Multiple dry swallows after each bite/sip;Follow solids with liquid (TIME b/t TSP trials) Postural Changes and/or Swallow Maneuvers: Out of bed for meals;Seated upright 90 degrees;Upright 30-60 min after meal                General recommendations:  (Dietician f/u) Oral Care Recommendations: Oral care BID;Oral care before and after PO;Staff/trained caregiver to provide oral care Follow up Recommendations: Inpatient Rehab;Skilled Nursing facility (TBD) SLP Visit Diagnosis: Dysphagia, oropharyngeal phase (R13.12) (Aphasia) Plan: Continue with current plan of care       GO                 Jerilynn Som, MS, CCC-SLP Speech Language Pathologist Rehab Services [phone] [facility] 10/14/2020, 3:10 PM

## 2020-10-15 LAB — GLUCOSE, CAPILLARY
Glucose-Capillary: 165 mg/dL — ABNORMAL HIGH (ref 70–99)
Glucose-Capillary: 135 mg/dL — ABNORMAL HIGH (ref 70–99)
Glucose-Capillary: 114 mg/dL — ABNORMAL HIGH (ref 70–99)
Glucose-Capillary: 170 mg/dL — ABNORMAL HIGH (ref 70–99)
Glucose-Capillary: 161 mg/dL — ABNORMAL HIGH (ref 70–99)
Glucose-Capillary: 144 mg/dL — ABNORMAL HIGH (ref 70–99)

## 2020-10-15 NOTE — Progress Notes (Addendum)
Physical Therapy Treatment Patient Details Name: Raymond Kerr MRN: [id number] DOB: Aug 15, 1955 Today's Date: 10/15/2020    History of Present Illness Pt is a 65 yo M w/ PMH of HTN, DM2, HL who presented with multiple neurologic sx and was found to have 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 3 days after admission he developed worsening aphasia and R sided weakness, transferred to ICU and underwent repeat imaging. Repeat MRI brain revealed extension of both his L frontal and brainstem infarcts as well as new watershed infarct in the internal border zone on the L. (per chart).     PT Comments    Pt alert, R eye open. Pt is making attempts to vocalize with some coherent speech, expressive difficulties remain. Emotion was present this treatment w/ pt crying, unable to articulate reason. Pt education provided and pt continued with therapy. Max-A + 2 for physical assist for functional tasks: rolling, supine <> sit w/ LUE/LLE assistance as able by patient. Pt demonstrated improvement in sitting balance; appropriate right rxn upon sitting w/ post/ant/lateral tactile input. Preservation is present intermittently w/ command following particularly during motor responses. Skilled PT intervention is indicated to address deficits in function, mobility, and to return to PLOF as able.  Discharge recommendations are SNF.   Follow Up Recommendations  SNF     Equipment Recommendations  Other (comment) (TBD next venue of care)    Recommendations for Other Services       Precautions / Restrictions Precautions Precautions: Fall Restrictions Weight Bearing Restrictions: No    Mobility  Bed Mobility Overal bed mobility: Needs Assistance Bed Mobility: Rolling;Supine to Sit;Sit to Supine Rolling: Max assist;+2 for physical assistance   Supine to sit: Max assist;+2 for physical assistance Sit to supine: Max assist;+2 for physical assistance        Transfers                  General transfer comment: Inappropriate at this time  Ambulation/Gait                 Stairs             Wheelchair Mobility    Modified Rankin (Stroke Patients Only)       Balance Overall balance assessment: Needs assistance Sitting-balance support: Single extremity supported;Feet unsupported Sitting balance-Leahy Scale: Fair Sitting balance - Comments: Pt is able to maintain midline w/ multi-modal cues for short period of time Postural control: Right lateral lean                                  Cognition Arousal/Alertness: Awake/alert Behavior During Therapy: WFL for tasks assessed/performed (Sad-crying) Overall Cognitive Status: Impaired/Different from baseline Area of Impairment: Orientation;Problem solving                             Problem Solving: Slow processing;Decreased initiation;Requires tactile cues;Requires verbal cues General Comments: Perseveration w/ movement      Exercises Other Exercises Other Exercises: Rolling x 2 for linen change. Pt requires MAX-A rolling L > R; able to use LUE/LLE w/ cues for task initiation Other Exercises: Static sitting 5 minutes, post/ant/lat right rxn present this tx    General Comments        Pertinent Vitals/Pain Pain Assessment: Faces Faces Pain Scale: No hurt Pain Intervention(s): Monitored during session    Home Living  Prior Function            PT Goals (current goals can now be found in the care plan section) Acute Rehab PT Goals Patient Stated Goal: To feel better PT Goal Formulation: With patient Time For Goal Achievement: 10/28/20 Potential to Achieve Goals: Fair Progress towards PT goals: Progressing toward goals    Frequency    7X/week      PT Plan Current plan remains appropriate    Co-evaluation              AM-PAC PT "6 Clicks" Mobility   Outcome Measure  Help needed turning from your  back to your side while in a flat bed without using bedrails?: A Lot Help needed moving from lying on your back to sitting on the side of a flat bed without using bedrails?: A Lot Help needed moving to and from a bed to a chair (including a wheelchair)?: Total Help needed standing up from a chair using your arms (e.g., wheelchair or bedside chair)?: Total Help needed to walk in hospital room?: Total Help needed climbing 3-5 steps with a railing? : Total 6 Click Score: 8    End of Session Equipment Utilized During Treatment: Gait belt Activity Tolerance: Patient tolerated treatment well Patient left: in bed;with bed alarm set Nurse Communication: Mobility status PT Visit Diagnosis: Other abnormalities of gait and mobility (R26.89);Muscle weakness (generalized) (M62.81);Other symptoms and signs involving the nervous system (R29.898);Hemiplegia and hemiparesis;Difficulty in walking, not elsewhere classified (R26.2) Hemiplegia - Right/Left: Right Hemiplegia - caused by: Cerebral infarction     Time: 1115-1200 PT Time Calculation (min) (ACUTE ONLY): 45 min  Charges:                       [employer], SPT

## 2020-10-15 NOTE — Progress Notes (Signed)
PROGRESS NOTE    Raymond Kerr  KYH:[id number] DOB: Feb 12, 1956 DOA: 10/06/2020 PCP: Barbette Reichmann, MD   Follow-up on acute stroke. Brief Narrative:   Raymond Kerr is a 65 y.o. African-American male with medical history significant for type II diabetes mellitus, GERD and hypertension, who presented to the emergency room with acute onset of diplopia and generalized weakness that started 8 hours prior to presentation.  The patient has been having expressive dysphasia. On arrival to the emergency room here it appears that the patient has left-sided weakness and difficulty with speech.  MRI of the brain showed small acute infarcts of the left paramedian frontal lobe and ventromedial left thalamus. Neurology consult has been obtained. MRA head showed a short segment of the left ACA A2 segment. TEE did not show any thrombus.  Patient had an episode of hypotension, midodrine was started. Repeated CT scan was obtained on 8/19 due to acute unresponsiveness.  Did not show any acute changes. Patient has also been seen by speech therapy, still significant dysphagia.  Recommend PEG tube placement.  Put on dysphagia diet since 8/23.   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  Acute left ACA ischemic stroke. Transient hypotension Dysphagia secondary to stroke. TEE showed ejection fraction 50 to 55%, no thrombus. Patient also had intermittent altered mental status due to hypotension, treated with midodrine. Blood pressure is running higher now, midodrine discontinued. Continue aspirin and Plavix. Discussed with speech therapy, patient currently on dysphagia diet.  Will monitor p.o. intake, will decide if PEG tube is needed tomorrow.   #2.  Type 2 diabetes   Continue current regimen. Discontinue IV fluids as patient is able to take p.o.  3.  Hypokalemia. Hyponatremia. Stable      DVT prophylaxis: Lovenox Code Status: Full Family Communication:  Disposition Plan:    Status is: Inpatient  Remains inpatient appropriate because:Inpatient level of care appropriate due to severity of illness  Dispo: The patient is from: Home              Anticipated d/c is to: CIR              Patient currently is not medically stable to d/c.   Difficult to place patient No        I/O last 3 completed shifts: In: 2345 [I.V.:2345] Out: 1100 [Urine:1100] Total I/O In: 300 [P.O.:300] Out: -      Consultants:  Neurology  Procedures: None  Antimicrobials: None   Subjective: Patient has significant aphasia, left facial droop. Spoke with the nurse, he was able to eat small meals, no nausea vomiting or abdominal pain. No short of breath or cough. No fever chills pain No dysuria hematuria  Objective: Vitals:   10/15/20 1000 10/15/20 1100 10/15/20 1200 10/15/20 1300  BP: 138/68 132/69 (!) 142/85 (!) 143/71  Pulse: 77 78 79 79  Resp:      Temp:  97.8 F (36.6 C) 97.8 F (36.6 C)   TempSrc:  Axillary Axillary   SpO2: 98% 96% 96% 97%  Weight:      Height:        Intake/Output Summary (Last 24 hours) at 10/15/2020 1338 Last data filed at 10/15/2020 0800 Gross per 24 hour  Intake  1825.81 ml  Output 700 ml  Net 1125.81 ml   Filed Weights   10/05/20 1950 10/06/20 2045 10/13/20 0624  Weight: 118.8 kg 120.4 kg 121.3 kg    Examination:  General exam: Appears calm and comfortable  Respiratory system: Clear to auscultation. Respiratory effort normal. Cardiovascular system: S1 & S2 heard, RRR. No JVD, murmurs, rubs, gallops or clicks. No pedal edema. Gastrointestinal system: Abdomen is nondistended, soft and nontender. No organomegaly or masses felt. Normal bowel sounds heard. Central nervous system: Alert and oriented x2, left  facial drrop. Extremities: Symmetric 5 x 5 power. Skin: No rashes, lesions or ulcers Psychiatry: Mood & affect appropriate.     Data Reviewed: I have personally reviewed following labs and imaging studies  CBC: Recent Labs  Lab 10/09/20 0955 10/10/20 0350  WBC 8.2 6.0  HGB 12.9* 12.3*  HCT 38.4* 37.1*  MCV 89.1 89.4  PLT 271 255   Basic Metabolic Panel: Recent Labs  Lab 10/09/20 0955 10/11/20 0949 10/14/20 0533  NA 132* 135 135  K 3.7  --  3.8  CL 104  --  103  CO2 24  --  25  GLUCOSE 108*  --  184*  BUN 11  --  11  CREATININE 1.06  --  1.05  CALCIUM 8.9  --  8.8*   GFR: Estimated Creatinine Clearance: 94.3 mL/min (by C-G formula based on SCr of 1.05 mg/dL). Liver Function Tests: No results for input(s): AST, ALT, ALKPHOS, BILITOT, PROT, ALBUMIN in the last 168 hours. No results for input(s): LIPASE, AMYLASE in the last 168 hours. No results for input(s): AMMONIA in the last 168 hours. Coagulation Profile: No results for input(s): INR, PROTIME in the last 168 hours. Cardiac Enzymes: No results for input(s): CKTOTAL, CKMB, CKMBINDEX, TROPONINI in the last 168 hours. BNP (last 3 results) No results for input(s): PROBNP in the last 8760 hours. HbA1C: No results for input(s): HGBA1C in the last 72 hours. CBG: Recent Labs  Lab 10/14/20 2323 10/15/20 0334 10/15/20 0715 10/15/20 0809 10/15/20 1115  GLUCAP 195* 165* 135* 114* 170*   Lipid Profile: No results for input(s): CHOL, HDL, LDLCALC, TRIG, CHOLHDL, LDLDIRECT in the last 72 hours. Thyroid Function Tests: No results for input(s): TSH, T4TOTAL, FREET4, T3FREE, THYROIDAB in the last 72 hours. Anemia Panel: No results for input(s): VITAMINB12, FOLATE, FERRITIN, TIBC, IRON, RETICCTPCT in the last 72 hours. Sepsis Labs: No results for input(s): PROCALCITON, LATICACIDVEN in the last 168 hours.  Recent Results (from the past 240 hour(s))  Resp Panel by RT-PCR (Flu A&B, Covid) Nasopharyngeal Swab     Status:  None   Collection Time: 10/06/20  1:23 AM   Specimen: Nasopharyngeal Swab; Nasopharyngeal(NP) swabs in vial transport medium  Result Value Ref Range Status   SARS Coronavirus 2 by RT PCR NEGATIVE NEGATIVE Final    Comment: (NOTE) SARS-CoV-2 target nucleic acids are NOT DETECTED.  The SARS-CoV-2 RNA is generally detectable in upper respiratory specimens during the acute phase of infection. The lowest concentration of SARS-CoV-2 viral copies this assay can detect is 138 copies/mL. A negative result does not preclude SARS-Cov-2 infection and should not be used as the sole basis for treatment or other patient management decisions. A negative result may occur with  improper specimen collection/handling, submission of specimen other than nasopharyngeal swab, presence of viral mutation(s) within the areas targeted by this assay, and inadequate number of viral copies(<138 copies/mL). A negative result must be combined with clinical observations, patient history, and epidemiological information.  The expected result is Negative.  Fact Sheet for Patients:  BloggerCourse.com  Fact Sheet for Healthcare Providers:  SeriousBroker.it  This test is no t yet approved or cleared by the [place] FDA and  has been authorized for detection and/or diagnosis of SARS-CoV-2 by FDA under an Emergency Use Authorization (EUA). This EUA will remain  in effect (meaning this test can be used) for the duration of the COVID-19 declaration under Section 564(b)(1) of the Act, 21 U.S.C.section 360bbb-3(b)(1), unless the authorization is terminated  or revoked sooner.       Influenza A by PCR NEGATIVE NEGATIVE Final   Influenza B by PCR NEGATIVE NEGATIVE Final    Comment: (NOTE) The Xpert Xpress SARS-CoV-2/FLU/RSV plus assay is intended as an aid in the diagnosis of influenza from Nasopharyngeal swab specimens and should not be used as a sole basis for  treatment. Nasal washings and aspirates are unacceptable for Xpert Xpress SARS-CoV-2/FLU/RSV testing.  Fact Sheet for Patients: BloggerCourse.com  Fact Sheet for Healthcare Providers: SeriousBroker.it  This test is not yet approved or cleared by the [place] FDA and has been authorized for detection and/or diagnosis of SARS-CoV-2 by FDA under an Emergency Use Authorization (EUA). This EUA will remain in effect (meaning this test can be used) for the duration of the COVID-19 declaration under Section 564(b)(1) of the Act, 21 U.S.C. section 360bbb-3(b)(1), unless the authorization is terminated or revoked.  Performed at [facility], [address] [place], [place] [address]   MRSA Next Gen by PCR, Nasal     Status: Abnormal   Collection Time: 10/12/20  8:28 AM   Specimen: Nasal Mucosa; Nasal Swab  Result Value Ref Range Status   MRSA by PCR Next Gen NEGATIVE (A) NOT DETECTED Final    Comment: Performed at [facility], [address] [place], [place] [address]         Radiology Studies: No results found.      Scheduled Meds:   stroke: mapping our early stages of recovery book   Does not apply Once   aspirin  81 mg Oral Daily   atorvastatin  80 mg Oral Daily   Chlorhexidine Gluconate Cloth  6 each Topical Daily   clopidogrel  75 mg Oral Daily   enoxaparin (LOVENOX) injection  0.5 mg/kg Subcutaneous Q24H   feeding supplement (NEPRO CARB STEADY)  237 mL Oral TID WC   insulin aspart  0-15 Units Subcutaneous Q4H   Continuous Infusions:  dextrose 5 % and 0.45% NaCl 50 mL/hr at 10/15/20 0600     LOS: 8 days    Time spent: 32 minutes    Marrion Coy, MD [facility]   To contact the attending provider between 7A-7P or the covering provider during after hours 7P-7A, please log into the web site [email or web address] and access using universal [place] password for that web site. If you  do not have the password, please call the hospital operator.  10/15/2020, 1:38 PM

## 2020-10-15 NOTE — Progress Notes (Signed)
Occupational Therapy Treatment Patient Details Name: Raymond Kerr MRN: [id number] DOB: 1955/10/03 Today's Date: 10/15/2020    History of present illness Pt is a 65 yo M w/ PMH of HTN, DM2, HL who presented with multiple neurologic sx and was found to have 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 3 days after admission he developed worsening aphasia and R sided weakness, transferred to ICU and underwent repeat imaging. Repeat MRI brain revealed extension of both his L frontal and brainstem infarcts as well as new watershed infarct in the internal border zone on the L. (per chart). Seen as a PT re-evaluation.   OT comments  Pt seen for OT treatment on this date. Upon arrival to room, pt awake and supine in bed. Pt with improved cognition this date; attempting to state name, answering ~50% of Y/N questions via head shaking/nodding, and following 75% of simple 1-step commands with multi-modal cues. Pt declined bed-level UB ADLs this date, however participated UE therex consisting of picking up cards with LUE, using LUE to perform digit extension/flexion with yellow theraputty, strengthening LUE via shoulder/elbow exercises with yellow theraband, and performing AAROM of RUE with assist from LUE. Pt is making good progress toward goals and continues to benefit from skilled OT services to maximize return to PLOF and minimize risk of future falls, injury, caregiver burden, and readmission. Will continue to follow POC. Discharge recommendation remains appropriate.     Follow Up Recommendations  SNF    Equipment Recommendations  Other (comment) (defer to next venue of care)       Precautions / Restrictions Precautions Precautions: Fall Restrictions Weight Bearing Restrictions: No       Mobility Bed Mobility               General bed mobility comments: not assessed this date                     ADL either performed or assessed with clinical  judgement   ADL  Pt declined participating in bed-level ADLs this date                                               Cognition Arousal/Alertness: Awake/alert Behavior During Therapy: WFL for tasks assessed/performed Overall Cognitive Status: Impaired/Different from baseline                                 General Comments: Pt with improved cognition this date, attempting to state name and "no". Able to answer ~50% of Y/N questions via head shaking/nodding and follow 75% of simple 1-step commands with multi-modal cues        Exercises General Exercises - Upper Extremity Shoulder Flexion: Strengthening;Left;10 reps;Theraband Theraband Level (Shoulder Flexion): Level 1 (Yellow) Elbow Flexion: PROM;Right;AROM;Left;10 reps;Supine Elbow Extension: Strengthening;Left;Supine;Theraband Theraband Level (Elbow Extension): Level 1 (Yellow) Hand Exercises Digit Composite Flexion: Strengthening;Left;5 reps;Other (comment) (theraputty) Composite Extension: Strengthening;Left;5 reps;Other (comment) (theraputty) Hand Activities Cards - Flip: Left;5 reps;Supine           Pertinent Vitals/ Pain       Pain Assessment: No/denies pain         Frequency  Min 2X/week        Progress Toward Goals  OT Goals(current goals can now be found in the care  plan section)     Acute Rehab OT Goals OT Goal Formulation: Patient unable to participate in goal setting Time For Goal Achievement: 10/28/20 Potential to Achieve Goals: Good  Plan Discharge plan remains appropriate;Frequency remains appropriate       AM-PAC OT "6 Clicks" Daily Activity     Outcome Measure   Help from another person eating meals?: A Lot Help from another person taking care of personal grooming?: A Lot Help from another person toileting, which includes using toliet, bedpan, or urinal?: Total Help from another person bathing (including washing, rinsing, drying)?: A Lot Help from another  person to put on and taking off regular upper body clothing?: A Lot Help from another person to put on and taking off regular lower body clothing?: Total 6 Click Score: 10    End of Session    OT Visit Diagnosis: Other abnormalities of gait and mobility (R26.89);Hemiplegia and hemiparesis Hemiplegia - Right/Left: Right Hemiplegia - dominant/non-dominant: Dominant Hemiplegia - caused by: Cerebral infarction   Activity Tolerance Patient tolerated treatment well   Patient Left in bed;with call bell/phone within reach;with bed alarm set   Nurse Communication Mobility status        Time: 7062-3762 OT Time Calculation (min): 23 min  Charges: OT General Charges $OT Visit: 1 Visit OT Treatments $Therapeutic Activity: 23-37 mins  Matthew Folks, OTR/L [facility] [phone]

## 2020-10-16 DIAGNOSIS — L8990 Pressure ulcer of unspecified site, unspecified stage: Secondary | ICD-10-CM | POA: Insufficient documentation

## 2020-10-16 LAB — GLUCOSE, CAPILLARY
Glucose-Capillary: 148 mg/dL — ABNORMAL HIGH (ref 70–99)
Glucose-Capillary: 165 mg/dL — ABNORMAL HIGH (ref 70–99)
Glucose-Capillary: 169 mg/dL — ABNORMAL HIGH (ref 70–99)
Glucose-Capillary: 192 mg/dL — ABNORMAL HIGH (ref 70–99)
Glucose-Capillary: 207 mg/dL — ABNORMAL HIGH (ref 70–99)
Glucose-Capillary: 212 mg/dL — ABNORMAL HIGH (ref 70–99)
Glucose-Capillary: 235 mg/dL — ABNORMAL HIGH (ref 70–99)

## 2020-10-16 MED ORDER — MEGESTROL ACETATE 400 MG/10ML PO SUSP
400.0000 mg | Freq: Every day | ORAL | Status: DC
  Administered 2020-10-16 – 2020-10-24 (×9): 400 mg via ORAL
  Filled 2020-10-16 (×9): qty 10

## 2020-10-16 NOTE — Progress Notes (Signed)
Patient was transferred from ICU. Alert ,oriented to person. Left sided weakness. Attempts to make words but unable. Patient has not voided this shift but had voided prior to coming to this unit per CNA. Patient was bladder scanned = 63 ml. urine. Will continue to monitor.

## 2020-10-16 NOTE — Plan of Care (Signed)
Will continue with improvement without signs of infection.

## 2020-10-16 NOTE — Progress Notes (Addendum)
PROGRESS NOTE    Raymond Kerr  MWU:[id number] DOB: [birth year] DOA: 10/06/2020 PCP: Barbette Reichmann, MD    Brief Narrative:  Raymond Kerr is a 65 y.o. African-American male with medical history significant for type II diabetes mellitus, GERD and hypertension, who presented to the emergency room with acute onset of diplopia and generalized weakness that started 8 hours prior to presentation.  The patient has been having expressive dysphasia. On arrival to the emergency room here it appears that the patient has left-sided weakness and difficulty with speech.  MRI of the brain showed small acute infarcts of the left paramedian frontal lobe and ventromedial left thalamus. Neurology consult has been obtained. MRA head showed a short segment of the left ACA A2 segment. TEE did not show any thrombus.  Patient had an episode of hypotension, midodrine was started. Repeated CT scan was obtained on 8/19 due to acute unresponsiveness.  Did not show any acute changes. Patient has also been seen by speech therapy, still significant dysphagia.  Recommend PEG tube placement.  Put on dysphagia diet since 8/23.   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  Acute left ACA ischemic stroke. Transient hypotension Dysphagia secondary to stroke. Patient conditions are stable, continue aspirin and Plavix. Continue PT/OT. Patient daughter came in today, had a long discussion, she does not want feeding tube at this time, she wanted give her a couple days and let her try to feed him. At this point, I will start megestrol as patient has really poor  appetite. Monitor potassium, magnesium and phosphorus.  2.  Type 2 diabetes. Continue current  regimen.  3.  Hypokalemia Hyponatremia.   recheck level tomorrow.   POA pressure ulcers.   Pressure Injury 10/15/20 Coccyx Medial Stage 2 -  Partial thickness loss of dermis presenting as a shallow open injury with a red, pink wound bed without slough. 1.0cm.  x 0.2 cm skin tear appearance middle of coccyx crack. clear moisture barrier applied (Active)  10/15/20 2300  Location: Coccyx  Location Orientation: Medial  Staging: Stage 2 -  Partial thickness loss of dermis presenting as a shallow open injury with a red, pink wound bed without slough.  Wound Description (Comments): 1.0cm.  x 0.2 cm skin tear appearance middle of coccyx crack. clear moisture barrier applied followed by foam dressing  Present on Admission:         DVT prophylaxis: Lovenox Code Status: full Family Communication: Daughter updated. Disposition Plan:    Status is: Inpatient  Remains inpatient appropriate because:Inpatient level of care appropriate due to severity of illness  Dispo: The patient is from: Home              Anticipated d/c is to: CIR              Patient currently is not medically stable to d/c.   Difficult to place patient No        I/O last 3 completed shifts: In: 1510.1 [P.O.:300; I.V.:1210.1] Out: 550 [Urine:550] No intake/output data recorded.     Consultants:  Neurology  Procedures: None  Antimicrobials: None   Subjective: Patient is awake, aphasic. He very poor appetite, he only ate a  few bites for lunch. No abdominal pain nausea vomiting No dysuria hematuria  No fever or chills. No short of breath or cough.  Objective: Vitals:   10/16/20 0502 10/16/20 0524 10/16/20 0751 10/16/20 1118  BP: (!) 172/99 (!) 152/76 134/83 131/81  Pulse: 96 90 84 89  Resp: 15 20 18  (!) 21  Temp: 98.2 F (36.8 C) 98.5 F (36.9 C) 98.6 F (37 C) 98.8 F (37.1 C)  TempSrc:  Axillary    SpO2:  98% 97% 98%  Weight:      Height:        Intake/Output Summary (Last 24  hours) at 10/16/2020 1223 Last data filed at 10/16/2020 0600 Gross per 24 hour  Intake 650.03 ml  Output 0 ml  Net 650.03 ml   Filed Weights   10/06/20 2045 10/13/20 0624 10/15/20 2215  Weight: 120.4 kg 121.3 kg 118.3 kg    Examination:  General exam: Appears calm and comfortable Respiratory system: Clear to auscultation. Respiratory effort normal. Cardiovascular system: S1 & S2 heard, RRR. No JVD, murmurs, rubs, gallops or clicks. No pedal edema. Gastrointestinal system: Abdomen is nondistended, soft and nontender. No organomegaly or masses felt. Normal bowel sounds heard. Central nervous system: Alert, left facial droop, aphasic. Extremities: Symmetric 5 x 5 power. Skin: No rashes, lesions or ulcers     Data Reviewed: I have personally reviewed following labs and imaging studies  CBC: Recent Labs  Lab 10/10/20 0350  WBC 6.0  HGB 12.3*  HCT 37.1*  MCV 89.4  PLT 255   Basic Metabolic Panel: Recent Labs  Lab 10/11/20 0949 10/14/20 0533  NA 135 135  K  --  3.8  CL  --  103  CO2  --  25  GLUCOSE  --  184*  BUN  --  11  CREATININE  --  1.05  CALCIUM  --  8.8*   GFR: Estimated Creatinine Clearance: 93.2 mL/min (by C-G formula based on SCr of 1.05 mg/dL). Liver Function Tests: No results for input(s): AST, ALT, ALKPHOS, BILITOT, PROT, ALBUMIN in the last 168 hours. No results for input(s): LIPASE, AMYLASE in the last 168 hours. No results for input(s): AMMONIA in the last 168 hours. Coagulation Profile: No results for input(s): INR, PROTIME in the last 168 hours. Cardiac Enzymes: No results for input(s): CKTOTAL, CKMB, CKMBINDEX, TROPONINI in the last 168 hours. BNP (last 3 results) No results for input(s): PROBNP in the last 8760 hours. HbA1C: No results for input(s): HGBA1C in the last 72 hours. CBG: Recent Labs  Lab 10/15/20 1930 10/16/20 0111 10/16/20 0502 10/16/20 0804 10/16/20 1122  GLUCAP 144* 148* 165* 169* 192*   Lipid Profile: No results  for input(s): CHOL, HDL, LDLCALC, TRIG, CHOLHDL, LDLDIRECT in the last 72 hours. Thyroid Function Tests: No results for input(s): TSH, T4TOTAL, FREET4, T3FREE, THYROIDAB in the last 72 hours. Anemia Panel: No results for input(s): VITAMINB12, FOLATE, FERRITIN, TIBC, IRON, RETICCTPCT in the last 72 hours. Sepsis Labs: No results for input(s): PROCALCITON, LATICACIDVEN in the last 168 hours.  Recent Results (from the past 240 hour(s))  MRSA Next Gen by PCR, Nasal     Status: Abnormal   Collection Time: 10/12/20  8:28 AM   Specimen: Nasal Mucosa; Nasal Swab  Result Value Ref Range Status   MRSA by PCR Next Gen NEGATIVE (A) NOT DETECTED Final    Comment: Performed at [facility], [address] [place], [place]         Radiology  Studies: No results found.      Scheduled Meds:   stroke: mapping our early stages of recovery book   Does not apply Once   aspirin  81 mg Oral Daily   atorvastatin  80 mg Oral Daily   Chlorhexidine Gluconate Cloth  6 each Topical Daily   clopidogrel  75 mg Oral Daily   enoxaparin (LOVENOX) injection  0.5 mg/kg Subcutaneous Q24H   feeding supplement (NEPRO CARB STEADY)  237 mL Oral TID WC   insulin aspart  0-15 Units Subcutaneous Q4H   megestrol  400 mg Oral Daily   Continuous Infusions:  dextrose 5 % and 0.45% NaCl 50 mL/hr at 10/16/20 0941     LOS: 9 days    Time spent: 32 minutes, more than 30% time involved in direct patient care.    Marrion Coy, MD [facility]   To contact the attending provider between 7A-7P or the covering provider during after hours 7P-7A, please log into the web site [email or web address] and access using universal [place] password for that web site. If you do not have the password, please call the hospital operator.  10/16/2020, 12:23 PM

## 2020-10-16 NOTE — Progress Notes (Signed)
PT Cancellation Note  Patient Details Name: Raymond Kerr MRN: [id number] DOB: 02/15/1956   Cancelled Treatment:    Reason Eval/Treat Not Completed: Patient declined, no reason specified Pt fatigued, unable to arouse with verbal and tactile cues. PT to reassess as able.   [employer], SPT

## 2020-10-16 NOTE — TOC Progression Note (Signed)
Transition of Care [facility]) - Progression Note    Patient Details  Name: Raymond Kerr MRN: [id number] Date of Birth: January 02, 1956  [facility] CM/SW Contact  Allayne Butcher, RN Phone Number: 10/16/2020, 11:04 AM  Clinical Narrative:    [facility] [employer] department arrived to the unit looking for the patient.  Patient was reported missing by his daughter [name].  [name] is on her way to the hospital now to see the patient.  TOC to follow and discuss discharge planning with the daughter.  Patient's girlfriend never informed the daughter of the patient's admission.     Expected Discharge Plan: IP Rehab Facility Barriers to Discharge: Continued Medical Work up  Expected Discharge Plan and Services Expected Discharge Plan: IP Rehab Facility   Discharge Planning Services: CM Consult Post Acute Care Choice: IP Rehab Living arrangements for the past 2 months: No permanent address Youth worker)                 DME Arranged: N/A DME Agency: NA       HH Arranged: NA HH Agency: NA         Social Determinants of Health (SDOH) Interventions    Readmission Risk Interventions No flowsheet data found.

## 2020-10-17 DIAGNOSIS — E876 Hypokalemia: Secondary | ICD-10-CM

## 2020-10-17 LAB — CBC WITH DIFFERENTIAL/PLATELET
Abs Immature Granulocytes: 0.03 10*3/uL (ref 0.00–0.07)
Basophils Absolute: 0 10*3/uL (ref 0.0–0.1)
Basophils Relative: 0 %
Eosinophils Absolute: 0.2 10*3/uL (ref 0.0–0.5)
Eosinophils Relative: 3 %
HCT: 37.7 % — ABNORMAL LOW (ref 39.0–52.0)
Hemoglobin: 12.6 g/dL — ABNORMAL LOW (ref 13.0–17.0)
Immature Granulocytes: 1 %
Lymphocytes Relative: 34 %
Lymphs Abs: 1.7 10*3/uL (ref 0.7–4.0)
MCH: 29.9 pg (ref 26.0–34.0)
MCHC: 33.4 g/dL (ref 30.0–36.0)
MCV: 89.3 fL (ref 80.0–100.0)
Monocytes Absolute: 0.4 10*3/uL (ref 0.1–1.0)
Monocytes Relative: 8 %
Neutro Abs: 2.9 10*3/uL (ref 1.7–7.7)
Neutrophils Relative %: 54 %
Platelets: 266 10*3/uL (ref 150–400)
RBC: 4.22 MIL/uL (ref 4.22–5.81)
RDW: 13.9 % (ref 11.5–15.5)
WBC: 5.2 10*3/uL (ref 4.0–10.5)
nRBC: 0 % (ref 0.0–0.2)

## 2020-10-17 LAB — BASIC METABOLIC PANEL
Anion gap: 5 (ref 5–15)
BUN: 10 mg/dL (ref 8–23)
CO2: 26 mmol/L (ref 22–32)
Calcium: 8.5 mg/dL — ABNORMAL LOW (ref 8.9–10.3)
Chloride: 105 mmol/L (ref 98–111)
Creatinine, Ser: 0.96 mg/dL (ref 0.61–1.24)
GFR, Estimated: >60 mL/min (ref >60)
Glucose, Bld: 169 mg/dL — ABNORMAL HIGH (ref 70–99)
Potassium: 3.5 mmol/L (ref 3.5–5.1)
Sodium: 136 mmol/L (ref 135–145)

## 2020-10-17 LAB — GLUCOSE, CAPILLARY
Glucose-Capillary: 162 mg/dL — ABNORMAL HIGH (ref 70–99)
Glucose-Capillary: 176 mg/dL — ABNORMAL HIGH (ref 70–99)
Glucose-Capillary: 178 mg/dL — ABNORMAL HIGH (ref 70–99)
Glucose-Capillary: 183 mg/dL — ABNORMAL HIGH (ref 70–99)

## 2020-10-17 LAB — PHOSPHORUS: Phosphorus: 2.3 mg/dL — ABNORMAL LOW (ref 2.5–4.6)

## 2020-10-17 LAB — MAGNESIUM: Magnesium: 1.6 mg/dL — ABNORMAL LOW (ref 1.7–2.4)

## 2020-10-17 MED ORDER — MAGNESIUM SULFATE 2 GM/50ML IV SOLN
2.0000 g | Freq: Once | INTRAVENOUS | Status: AC
  Administered 2020-10-17: 2 g via INTRAVENOUS
  Filled 2020-10-17: qty 50

## 2020-10-17 MED ORDER — INSULIN GLARGINE-YFGN 100 UNIT/ML ~~LOC~~ SOLN
10.0000 [IU] | Freq: Every day | SUBCUTANEOUS | Status: DC
  Administered 2020-10-17 – 2020-10-23 (×7): 10 [IU] via SUBCUTANEOUS
  Filled 2020-10-17 (×8): qty 0.1

## 2020-10-17 MED ORDER — POTASSIUM PHOSPHATES 15 MMOLE/5ML IV SOLN
20.0000 mmol | Freq: Once | INTRAVENOUS | Status: AC
  Administered 2020-10-17: 17:00:00 20 mmol via INTRAVENOUS
  Filled 2020-10-17: qty 6.67

## 2020-10-17 NOTE — Progress Notes (Signed)
Occupational Therapy Treatment Patient Details Name: Raymond Kerr MRN: [id number] DOB: [birth year] Today's Date: 10/17/2020    History of present illness Pt is a 65 yo M w/ PMH of HTN, DM2, HL who presented with multiple neurologic sx and was found to have 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 3 days after admission he developed worsening aphasia and R sided weakness, transferred to ICU and underwent repeat imaging. Repeat MRI brain revealed extension of both his L frontal and brainstem infarcts as well as new watershed infarct in the internal border zone on the L. (per chart). Seen as a PT re-evaluation.   OT comments  Pt supine in bed with daughter present in the room. This will be pt's third therapy session today. Pt visually scans to the R with mod - max cuing. R UE PROM in all planes of movement with increased tone noted. Supine >sit with max A to EOB. Pt does move L LE towards EOB but needing mod multimodal cuing for sequencing and initiation. Pt sitting balance with min guard on EOB. Focus on locating R UE and having pt thread fingers and attempt self ROM while seated. Pt assisted into R lateral lean onto bed but no trace strength noted in R UE when returning to midline. Pt standing x 2 reps from EOB with total A. Pt does bear weight through L LE but performing 10% of task himself. R knee blocked with therapist positioned in front of pt. Use of hand squeezes and head nods for communication to "yes" and "no" questions. Pt returning to supine with total A and repositioning. OT recommendation changed to CIR. Pt with supportive family, independent prior to admission, and tolerated 3 therapy sessions today.   Follow Up Recommendations  CIR    Equipment Recommendations  Other (comment) (defer to next venue of care)       Precautions / Restrictions Precautions Precautions: Fall       Mobility Bed Mobility Overal bed mobility: Needs Assistance Bed  Mobility: Supine to Sit;Sit to Supine Rolling: Max assist   Supine to sit: Max assist Sit to supine: Total assist   General bed mobility comments: Pt does initiate movement of L LE towards EOB    Transfers Overall transfer level: Needs assistance Equipment used: 1 person hand held assist   Sit to Stand: Total assist         General transfer comment: Pt does initiate stand with therapist and bearing weight through L LE. Pt with poor trunk control and R knee blocked in standing. Pt standing x 2 reps from EOB.    Balance Overall balance assessment: Needs assistance Sitting-balance support: Single extremity supported Sitting balance-Leahy Scale: Fair     Standing balance support: During functional activity Standing balance-Leahy Scale: Zero                             ADL either performed or assessed with clinical judgement      Cognition Arousal/Alertness: Awake/alert Behavior During Therapy: WFL for tasks assessed/performed Overall Cognitive Status: Impaired/Different from baseline                         Following Commands: Follows one step commands with increased time     Problem Solving: Requires verbal cues;Requires tactile cues;Slow processing General Comments: Pt is very pleasant and cooperative. Pt answering questions with hand squeezes or head nods. Increased time to  sequence task with mod - max multimodal cuing.                   Pertinent Vitals/ Pain       Pain Assessment: Faces Faces Pain Scale: No hurt  Home Living     Available Help at Discharge: Other (Comment) (No assistance is available) Type of Home: House                              Lives With: Daughter        Frequency  Min 3X/week        Progress Toward Goals  OT Goals(current goals can now be found in the care plan section)  Progress towards OT goals: Progressing toward goals  Acute Rehab OT Goals Patient Stated Goal: To feel  better Time For Goal Achievement: 10/28/20 Potential to Achieve Goals: Good  Plan Discharge plan needs to be updated;Frequency needs to be updated       AM-PAC OT "6 Clicks" Daily Activity     Outcome Measure   Help from another person eating meals?: Total Help from another person taking care of personal grooming?: Total Help from another person toileting, which includes using toliet, bedpan, or urinal?: Total Help from another person bathing (including washing, rinsing, drying)?: Total Help from another person to put on and taking off regular upper body clothing?: Total Help from another person to put on and taking off regular lower body clothing?: Total 6 Click Score: 6    End of Session    OT Visit Diagnosis: Other abnormalities of gait and mobility (R26.89);Hemiplegia and hemiparesis Hemiplegia - Right/Left: Right Hemiplegia - dominant/non-dominant: Dominant Hemiplegia - caused by: Cerebral infarction   Activity Tolerance Patient tolerated treatment well   Patient Left in bed;with call bell/phone within reach;with bed alarm set;with family/visitor present   Nurse Communication Mobility status        Time: 4650-3546 OT Time Calculation (min): 30 min  Charges: OT General Charges $OT Visit: 1 Visit OT Treatments $Neuromuscular Re-education: 23-37 mins  Jackquline Denmark, MS, OTR/L , CBIS ascom [phone]  10/17/20, 4:34 PM

## 2020-10-17 NOTE — Progress Notes (Signed)
Neurology Progress Note  Patient ID: 65 yo gentleman with hx HTN, DM2, HL who presented with multiple neurologic sx and was found to have 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 3 days after admission he developed worsening aphasia and R sided weakness. Repeat MRI brain revealed extension of both his L frontal and brainstem infarcts as well as new watershed infarct in the internal border zone on the L. 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  Subjective: - Called by patient RN and primary hospitalist MD to speak with daughter who had questions about stroke, recovery and feeding - No acute changes overnight  Exam: Vitals:   10/17/20 0458 10/17/20 0825  BP: 128/64 124/67  Pulse: 70 71  Resp: 18 20  Temp: 98.4 F (36.9 C) 98.4 F (36.9 C)  SpO2: 97% 97%   Physical Exam Gen: Awake and alert HEENT: Atraumatic, normocephalic;mucous membranes moist; oropharynx clear, tongue without atrophy or fasciculations. Neck: Supple, trachea midline. Resp: CTAB, no w/r/r CV: RRR, no m/g/r; nml S1 and S2. 2+ symmetric peripheral pulses. Abd: soft/NT/ND; nabs x 4 quad Extrem: Nml bulk; no cyanosis, clubbing, or edema. Neuro: MS: Awake alert Speech: question if he is verbalizing purposefully or mumbling. Able to follow simple commands and mimic on the left. CN: PERRL. Left ptosis, extraocular movement exam reveals inability to adduct the left eye, VFF to threat, right lower face weakness. Motor:   Normal bulk.  No tremor, rigidity or bradykinesia. LUE and LLE full strength throughout. No movement against gravity RUE and RLE. Now with increased tone int he right UE and LE  Sensory: Impaired to LT RUE and RLE Coordination:  FNF intact left, unable to perform right NIHSS 1a Level of Conscious.: 0 1b LOC Questions: 0 1c LOC Commands:1 2 Best  Gaze: 1 3 Visual: 0 4 Facial Palsy: 2 5a Motor Arm - left: 0 5b Motor Arm - Right: 3 6a Motor Leg - Left: 0 6b Motor Leg - Right: 3 7 Limb Ataxia: 0 8 Sensory: 1 9 Best Language: 2 10 Dysarthria: 2 11 Extinct. and Inatten.: 0  TOTAL: 15  Personally reviewed imaging CTA head and neck 81/8 IMPRESSION: No acute intracranial hemorrhage. Evolving areas of recent infarction are identified in the left thalamus and left ACA territory. No definite acute infarct.  No new large vessel occlusion. 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  Perfusion imaging demonstrates 4 mL of penumbra within the distal left ACA territory.  MRI 8/18 IMPRESSION: 1. Multifocal acute ischemia in the left hemisphere and left brainstem, slightly worsened since 10/06/2020. 2. No hemorrhage or mass effect.  A1c - 10.1 LDL 124  Impression: This is a 65 yo gentleman with hx HTN, DM2, HL who presented with multiple neurologic sx and was found to have 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 There is concern for potential central embolic source given his infarcts in both anterior and posterior circulation but both TTE and TEE showed no e/o intracardiac clot.  He had been seen and followed by my partner last week. I was recalled to assess and talk to  family (daughter) who arrived yesterday after many days not knowing that her father was in the hospital-she was notified by his work that he has not reported to work for many days and that is when she sought help from police to locate him.  She had questions about the diagnosis, recovery and specifically about feeding, which I addressed in detail with her.  Recommendations: - Goal normotension; strict avoidance of  hypotension - patient extended his infarcts earlier this admission for relative hypotension SBP <110 - Continue ASA 81mg  daily + plavix 75mg  daily x90 days given severe intracranial stenosis f/b ASA 81mg  daily after that - Atorvastatin 80mg  daily - goal LDL <70 - Blood sugars management - goal A1c <7.0 - Tele - PT/OT/SLP - Stroke education - Amb referral to neurology upon discharge. 4-6 weeks - He will need a Zio patch in clinic for ambulatory cardiac monitoring given infarcts in multiple vascular distributions that could suggest an embolic source. - RD has seen the patient and is concerned about inadequate caloric intake. Also has severe dysphagia from his brainstem stroke. Remains high risk for aspiration due to fluctuating mental status. I discussed with daughter the risks and benefits of PEG tube placement for nutrition as well as aspiration prevention. I spent around 35 minutes discussing and answering questions. She is assimilating the info and would like to hold off on making decision about PEG. I have requested Dietary consult to reiterate and educate about nutritional status, needs and PEG related practical questions.  Relayed my plan to Dr. .  Please recall neurology service as needed.  -- , MD Neurologist [facility] Pager: [phone]

## 2020-10-17 NOTE — Progress Notes (Addendum)
Physical Therapy Treatment Patient Details Name: Raymond Kerr MRN: [id number] DOB: [birth year] Today's Date: 10/17/2020    History of Present Illness Pt is a 65 yo M w/ PMH of HTN, DM2, HL who presented with multiple neurologic sx and was found to have 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 3 days after admission he developed worsening aphasia and R sided weakness, transferred to ICU and underwent repeat imaging. Repeat MRI brain revealed extension of both his L frontal and brainstem infarcts as well as new watershed infarct in the internal border zone on the L. (per chart). Seen as a PT re-evaluation.    PT Comments    Pt alert, family present throughout treatment. L eye remains closed, pt tracks with R eye with increased processing time for following objects across room. Pt attempted to vocalized more this session, stating "no" on several occasions. Overall, vocalization and expressive difficulties remain. MAX-Ax2 is required for functional tasks due to RUE/RLE plegia. Pt is able to reach w/ LUE/LLE during rolling. Pt continues to demonstrate improvement in sitting balance w/ appropriate righting rxn during multidirectional input and ability to shift weight through his trunk. Perseveration remains intermittently w/ command following during motor responses. Skilled PT intervention is indicated to address deficits in function, mobility, and to return to PLOF as able.  Discharge recommendations SNF at this time.    Follow Up Recommendations  SNF     Equipment Recommendations  Other (comment) (TBD next venue of care)    Recommendations for Other Services       Precautions / Restrictions Precautions Precautions: Fall Restrictions Weight Bearing Restrictions: No    Mobility  Bed Mobility Overal bed mobility: Needs Assistance Bed Mobility: Supine to Sit;Sit to Supine Rolling: Max assist;+2 for physical assistance   Supine to sit: Max assist;+2 for  physical assistance Sit to supine: Max assist;+2 for physical assistance   General bed mobility comments: participates rolling R w/ LLE, LUE    Transfers     Transfers: Lateral/Scoot Transfers          Lateral/Scoot Transfers: Max assist;+2 physical assistance General transfer comment: Posterior & lateral scoot: Max-A, requires multi-modal cues for LLE usage, utilizes LUE for boosting  Ambulation/Gait                 Stairs             Wheelchair Mobility    Modified Rankin (Stroke Patients Only)       Balance Overall balance assessment: Needs assistance Sitting-balance support: Single extremity supported Sitting balance-Leahy Scale: Fair Sitting balance - Comments: Pt is able to maintain midline w/ multi-modal cues for short period of time Postural control: Right lateral lean                                  Cognition Arousal/Alertness: Awake/alert Behavior During Therapy: WFL for tasks assessed/performed Overall Cognitive Status: Impaired/Different from baseline Area of Impairment: Problem solving;Following commands                       Following Commands: Follows one step commands with increased time     Problem Solving: Requires verbal cues;Requires tactile cues;Slow processing General Comments: Pt uanble to state name this date, able to say "no". Continues to answer 75% of questions w/ tactile PT/pt hand squeeze, 25% head shaking/nodding      Exercises Other Exercises Other  Exercises: Seated reaching x 10: pt able to maintain balance during multi-direction reaching w/ min-gaurd Other Exercises: Seat weight shifting x 15: pt is able to shift weight to both sides w/ min-gaurd w/ L & R lateral trunk flexion    General Comments        Pertinent Vitals/Pain Pain Assessment: Faces Faces Pain Scale: Hurts even more Pain Location: L knee Pain Descriptors / Indicators: Grimacing Pain Intervention(s): Limited activity  within patient's tolerance;Monitored during session;Repositioned    Home Living                      Prior Function            PT Goals (current goals can now be found in the care plan section) Acute Rehab PT Goals Patient Stated Goal: To feel better PT Goal Formulation: With patient Time For Goal Achievement: 10/28/20 Potential to Achieve Goals: Fair Progress towards PT goals: Progressing toward goals    Frequency    7X/week      PT Plan Current plan remains appropriate    Co-evaluation              AM-PAC PT "6 Clicks" Mobility   Outcome Measure  Help needed turning from your back to your side while in a flat bed without using bedrails?: A Lot Help needed moving from lying on your back to sitting on the side of a flat bed without using bedrails?: A Lot Help needed moving to and from a bed to a chair (including a wheelchair)?: Total Help needed standing up from a chair using your arms (e.g., wheelchair or bedside chair)?: Total Help needed to walk in hospital room?: Total Help needed climbing 3-5 steps with a railing? : Total 6 Click Score: 8    End of Session Equipment Utilized During Treatment: Gait belt Activity Tolerance: Patient tolerated treatment well Patient left: in bed;with bed alarm set;with family/visitor present;with call bell/phone within reach Nurse Communication: Mobility status PT Visit Diagnosis: Other abnormalities of gait and mobility (R26.89);Muscle weakness (generalized) (M62.81);Other symptoms and signs involving the nervous system (R29.898);Hemiplegia and hemiparesis;Difficulty in walking, not elsewhere classified (R26.2) Hemiplegia - Right/Left: Right Hemiplegia - caused by: Cerebral infarction     Time: 1000-1040 PT Time Calculation (min) (ACUTE ONLY): 40 min  Charges:                        [employer], SPT

## 2020-10-17 NOTE — Progress Notes (Signed)
Pt family member refused CPAP for patient.

## 2020-10-17 NOTE — Progress Notes (Signed)
Nutrition Follow-up  DOCUMENTATION CODES:  Obesity unspecified  INTERVENTION:  Continue current diet as ordered per SLP recommendation Nepro Shake po TID, each supplement provides 425 kcal and 19 grams protein Magic cup TID with meals, each supplement provides 290 kcal and 9 grams of protein Pt would benefit from PEG placement to allow him to meet nutrition needs while continuing to work on regaining functional status  NUTRITION DIAGNOSIS:  Inadequate oral intake related to  (decreased functional status s/p CVA) as evidenced by meal completion < 50% (and pt requiring full assistance with feeding).  GOAL:  Patient will meet greater than or equal to 90% of their needs  MONITOR:  PO intake, Labs, Weight trends  REASON FOR ASSESSMENT:  Rounds (per SLP request - at risk for not meeting nutrition needs)    ASSESSMENT:  65 y.o. African-American male with medical history significant for type II DM, GERD, and HTN presented to the ED with acute onset of generalized weakness. Raymond Kerr reports pt has also been having expressive dysphasia with dysarthria and diplopia since 8/13.  Imaging in ED positive for small acute infarct of the left paramedian frontal lobe and ventral medial left thalamus with short segment occlusion of the left ACA A2 segment and moderate narrowing of the right ACA proximal A3 segment. Neurology consult placed. Pt had a change in status 8/18 and code stroke was activated but no acute changes seen on imaging. Pt less responsive on 8/19.  Pt resting in bed at the time of visit, family at bedside. Neurology spoke with family today about pt's recovery and feeding PEG was discussed and pt's daughter, Raymond Kerr, requested more information on the tube and pt's nutrition needs. Raymond Kerr not present at the time of visit, but did discuss with family in the room and left Raymond Kerr a voicemail with my office number. Will follow-up with questions on Monday if call is not returned today.    Spoke with SLP about pt's progress. Was able to be upgraded to a DYS 3 diet with thin liquids, but pt is still requiring max assistance with feeding, holding a cup, etc. Feeding is very slow and pt is becoming fatigued prior to meeting nutrition needs. SLP still agrees with recommendation to place PEG to allow pt to meet nutrition needs while regaining functional status. RN reports that pt had a few spoonfuls of food for lunch and a magic cup.  Average Meal Intake: 8/16-8/22: 75% intake x 6 recorded meals (50-100%) 8/23-8/26: 75% intake x 1 recorded meal  Nutritionally Relevant Medications: Scheduled Meds:  atorvastatin  80 mg Oral Daily   feeding supplement (NEPRO CARB STEADY)  237 mL Oral TID WC   insulin aspart  0-15 Units Subcutaneous Q4H   megestrol  400 mg Oral Daily   Continuous Infusions:  dextrose 5 % and 0.45% NaCl 50 mL/hr at 10/16/20 2347   PRN Meds: magnesium hydroxide, ondansetron, sennosides  Labs Reviewed: Mg 1.6 Phosphorus 2.3 SBG ranges from 169-235 mg/dL over the last 24 hours HgbA1c 10.1% (8/15)  NUTRITION - FOCUSED PHYSICAL EXAM: Flowsheet Row Most Recent Value  Orbital Region No depletion  Upper Arm Region No depletion  Thoracic and Lumbar Region No depletion  Buccal Region No depletion  Temple Region No depletion  Clavicle Bone Region No depletion  Clavicle and Acromion Bone Region No depletion  Scapular Bone Region No depletion  Dorsal Hand No depletion  Patellar Region No depletion  Anterior Thigh Region No depletion  Posterior Calf Region No depletion  Edema (RD  Assessment) Severe  Hair Reviewed  Eyes Reviewed  Mouth Reviewed  Skin Reviewed  Nails Reviewed   Diet Order:   Diet Order             DIET DYS 3 Room service appropriate? Yes with Assist; Fluid consistency: Thin  Diet effective now                  EDUCATION NEEDS:  Not appropriate for education at this time  Skin:  Skin Assessment: Reviewed RN Assessment  Last BM:   8/26 - type 6  Height:  Ht Readings from Last 1 Encounters:  10/06/20 6' (1.829 m)   Weight:  Wt Readings from Last 1 Encounters:  10/15/20 118.3 kg   Ideal Body Weight:  80.9 kg  BMI:  Body mass index is 35.37 kg/m.  Estimated Nutritional Needs:  Kcal:  2300-2500 kcal/d Protein:  115-130g/d Fluid:  2.2-2.5 L/d  Raymond Kerr, RD, LDN Clinical Dietitian Pager on Amion

## 2020-10-17 NOTE — Evaluation (Signed)
Speech Language Pathology Evaluation Patient Details Name: Raymond Kerr MRN: [id number] DOB: 01-Aug-1955 Today's Date: 10/17/2020 Time: 4562-5638 SLP Time Calculation (min) (ACUTE ONLY): 45 min  Problem List:  Patient Active Problem List   Diagnosis Date Noted   Hypokalemia 10/17/2020   Hypomagnesemia 10/17/2020   Hypophosphatemia 10/17/2020   Pressure injury of skin 10/16/2020   Cerebrovascular accident (CVA) (HCC)    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) 10/07/2020   Paralysis of left third cranial nerve    Right sided weakness    TIA (transient ischemic attack) 10/06/2020   Acute ischemic left ACA stroke (HCC) 10/06/2020   Microhematuria 12/10/2016   Elevated PSA 12/10/2016   Diabetes mellitus (HCC) 11/28/2016   Benign essential hypertension 10/20/2015   Past Medical History:  Past Medical History:  Diagnosis Date   Diabetes mellitus without complication (HCC)    GERD (gastroesophageal reflux disease)    Hypertension    Past Surgical History:  Past Surgical History:  Procedure Laterality Date   TEE WITHOUT CARDIOVERSION N/A 10/08/2020   Procedure: TRANSESOPHAGEAL ECHOCARDIOGRAM (TEE);  Surgeon: Lamar Blinks, MD;  Location: [facility] ORS;  Service: Cardiovascular;  Laterality: N/A;   HPI:  Pt  is a 65 y.o. African-American male with medical history significant for type II diabetes mellitus, GERD and hypertension, who presented to the emergency room with acute onset of diplopia and generalized weakness that started 8 hours prior to presentation.  The patient has been having expressive dysphasia.  His fiance who accompanied him stated that his symptoms started around midnight on 8/13 with dysarthria and diplopia but he was more confused.  He was noted in the ER to have left facial droop and was unable to lift his left heel.  They went to [place] on Friday and he started having symptoms on Saturday night with double vision.  They went to bed at 12  midnight and at 6 AM she noted that he knocked food on the floor and was later starting to have slurred speech.  He was feeling generally weak and unable to ambulate however without unilateral focal muscle weakness or paresthesias.  No chest pain or dyspnea or palpitations.  No cough or wheezing.  No urinary or stool incontinence.  CT Angio of head/neck: "No acute intracranial hemorrhage. Evolving areas of recent  infarction are identified in the left thalamus and left ACA  territory. No definite acute infarct.     No new large vessel occlusion. 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 Perfusion imaging demonstrates 4 mL of penumbra within the distal  left ACA territory.".  MRI: Small acute infarcts of the left paramedian frontal lobe and  ventromedial left thalamus. No hemorrhage or mass effect.  UPDATED: 10/13/2020:  Per Neuro, "3 days after admission he developed worsening aphasia and R sided weakness. Repeat MRI brain revealed extension of both his L frontal and brainstem infarcts as well as new watershed infarct in the internal border zone on the L". With this decline in status, pt was transferred to CCU over the weekend(8/20) and made NPO.   Assessment / Plan / Recommendation Clinical Impression  Pt seen for a repeat Cognitive-linguistic evaluation s/p decline in Neurological status w/ transfer to CCU. Brief NPO status over the weekend d/t concerns of Cognitive-linguistic status and dysphagia. Pt is primarily nonverbal w/ intermittent attempts at single words per family, staff. Language deficits worsened since initial  evaluation post admit. Per Neuro, "3 days after admission, he developed worsening aphasia and R sided weakness. Repeat MRI brain revealed extension of both his L frontal and brainstem infarcts as well as new watershed infarct in the internal border zone on the L".   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 Cognitive/orientation suspect is impacted by the severity of the Aphasia. During expressive/receptive language tasks, pt was able to produce approximated words during automatic speech tasks, naming tasks. Delayed processing speed/response time noted, and MLU (~1-2 words intermittently). He was oriented x3 (self/place/family present) and able to comprehend humor x3 during conversation. During basic yes/no questions, ~70% accuracy noted given cues and simplification of question/sentence. During secondary targeted task, pt able to ID appropriate concrete items on bedside table following wh-hypothetical questions in 2/5 trials (i.e. If you wanted to turn on the TV, which item would you need?, etc). Pt demonstrated severe difficulty with confrontational repetition tasks, naming; Phonemic cues intermittently successful.  SLP developed visual aid and exercies for Automatic speech tasks re: caregiver education to enhance effective communication with pt. SLP recommends the following: use yes/no questions when possible, provide additional time for responses, provide choices when needed, & use shorter sentences + gestures to enhance understanding. SLP reviewed sign with pt - he nodded head in understanding and agreement with recommendations. Family present and provided thorough education re: supporting pt's communication engagement w/ others. Recommend continue skilled ST services to address expressive/receptive language deficits and to enhance communication independence w/ others for ADLs and to lessen caregive burden.    SLP Assessment  SLP Recommendation/Assessment: All further Speech Lanaguage Pathology  needs can be addressed in the next venue of care SLP Visit Diagnosis: Dysphagia, oropharyngeal phase (R13.12)  (Aphasia, Apraxia)    Follow Up Recommendations  Inpatient Rehab (TBD)    Frequency and Duration min 2x/week while admitted 1 week      SLP Evaluation Cognition  Overall Cognitive Status: Impaired/Different from baseline Arousal/Alertness: Awake/alert Orientation Level: Oriented to person;Oriented to place (head nods) Attention: Selective Selective Attention: Appears intact Memory: Impaired Memory Impairment: Decreased recall of new information Awareness: Appears intact (cursory) Problem Solving:  (CNT fully) Behaviors:  (none) Safety/Judgment:  (CNT fully)       Comprehension  Auditory Comprehension Overall Auditory Comprehension: Impaired Yes/No Questions: Impaired Complex Questions: 50-74% accurate Other Yes/No Questions Comments`:  (was able to answer to basic questions re: self and immediate environment/family present) Commands: Impaired Two Step Basic Commands: 0-24% accurate Conversation: Simple Other Conversation Comments: severe expressive deficits present; potential Apraxia Interfering Components: Motor planning;Processing speed EffectiveTechniques: Extra processing time;Pausing;Repetition;Slowed speech Visual Recognition/Discrimination Discrimination: Not tested Reading Comprehension Reading Status: Not tested    Expression Expression Primary Mode of Expression: Verbal Verbal Expression Overall Verbal Expression: Impaired Initiation: Impaired Automatic Speech: Name Level of Generative/Spontaneous Verbalization: Word Repetition: Impaired Level of Impairment: Word level Naming: Impairment Responsive: 0-25% accurate Confrontation: Not tested Other Naming Comments: severe expressive deficits present; potential Apraxia Verbal Errors: Phonemic paraphasias;Semantic paraphasias Pragmatics:  (min labile) Interfering Components: Speech intelligibility Effective Techniques:  (Y/N questions format; gestures) Non-Verbal Means of Communication: Gestures (head  nod/shake) Other Verbal Expression Comments: suspect Apraxia Written Expression Dominant Hand: Right Written Expression: Not tested   Oral / Motor  Oral Motor/Sensory Function Overall Oral Motor/Sensory Function: Moderate impairment Facial ROM: Reduced right;Suspected CN VII (facial) dysfunction Facial  Symmetry: Abnormal symmetry right;Suspected CN VII (facial) dysfunction Facial Strength: Reduced right;Suspected CN VII (facial) dysfunction Facial Sensation: Reduced right;Suspected CN V (Trigeminal) dysfunction Lingual ROM: Reduced right;Suspected CN XII (hypoglossal) dysfunction Lingual Symmetry: Abnormal symmetry right;Suspected CN XII (hypoglossal) dysfunction Lingual Strength: Reduced;Suspected CN XII (hypoglossal) dysfunction Lingual Sensation: Reduced;Suspected CN VII (facial) dysfunction-anterior 2/3 tongue Velum: Within Functional Limits (grossly when viewed) Mandible: Within Functional Limits Motor Speech Overall Motor Speech: Impaired Respiration: Within functional limits Phonation: Low vocal intensity (delayed onset) Articulation: Impaired Level of Impairment: Word Intelligibility: Intelligibility reduced Word: 0-24% accurate Motor Planning: Impaired Level of Impairment: Word Effective Techniques: Slow rate;Over-articulate;Pause (min)   GO                      Jerilynn Som, MS, CCC-SLP Speech Language Pathologist Rehab Services [phone] [facility] 10/17/2020, 3:19 PM

## 2020-10-17 NOTE — Progress Notes (Signed)
Speech Language Pathology Treatment: Dysphagia  Patient Details Name: Raymond Kerr MRN: [id number] DOB: February 12, 1956 Today's Date: 10/17/2020 Time: 4098-1191 SLP Time Calculation (min) (ACUTE ONLY): 50 min  Assessment / Plan / Recommendation Clinical Impression  Pt seen today for ongoing assessment of swallowing, toleration of current dysphagia diet, and potential upgrade of diet consistency. Pt continues to present w/ improved alertness/awareness per chart notes and family. He has moved out of CCU(2 days). He verbally engaged w/ SLP and answered a few general Y/N questions re: self as well as attempted verbalizations w/ cues. Pt appears to present w/ Aphasia and possibly Apraxia. Pt required verbal cues to ensure follow through w/ aspiration precautions, and given positioning more midline in bed. Pt is on RA; wbc wnl. Daughters present in room.    Pt and Family re-explained general aspiration precautions which were implemented as pt assisted in feeding self. Support and verbal cues given. Pt nodded head in understanding when asked/cued. Post oral care, trials of ice chips then sips of thin liquids via Cup given. He consumed ~15+ trials of thin liquids Via Cup w/ No overt clinical s/s of aspiration were noted; respiratory status remained calm and unlabored, vocal quality clear b/t trials when assessed, no overt coughing or throat clearing. Pt held Cup w/ support when drinking following instructions for single, small sips slowly. NO straws were utiilized for better oral control while drinking. Same noted for trials of mech soft foods. Occasionally, pt exhibited multiple swallows. Oral phase appeared Min slow for bolus management and timely transfer for swallowing; delayed A-P transfer for swallowing. Oral clearing achieved w/ all consistencies given TIME and verbal cues to use lingual sweeping and f/u, Dry swallow -- encouraged this w/ most trials to ensure full clearing. Intermittent bolus loss on R  side of mouth occurred w/ thin liquids. Noted min increased oral phase time w/ increased textured trials(solids though moistened) - overall weakness. Mod support given w/ feeding of trials -- pt encouraged to help Hold Cup for drinking to increased safety w/ trials. He followed instructions for Small, Single Sips.   Recommend a Dysphagia level 3 diet (minced meats) w/ gravies added to moisten foods; Thin liquids VIA CUP ONLY. Recommend aspiration precautions; Pills CRUSHED vs Whole in Puree for cohesion and safer swallowing currently; tray setup and positioning assistance for meals w/ support as needed. Support w/ feeding at meals. REFLUX precautions recommended. NSG updated. Family/pt were given education on diet consistency and food options/preparation for ease of mastication for conservation of energy; education on aspiration precautions. Precautions posted at bedside.    Recommend f/u w/ cognitive-linguistic assessment for new deficits noted post initial language evaluation completed in order to address his communication needs during ADLs.      HPI HPI: Pt  is a 65 y.o. African-American male with medical history significant for type II diabetes mellitus, GERD and hypertension, who presented to the emergency room with acute onset of diplopia and generalized weakness that started 8 hours prior to presentation.  The patient has been having expressive dysphasia.  His fiance who accompanied him stated that his symptoms started around midnight on 8/13 with dysarthria and diplopia but he was more confused.  He was noted in the ER to have left facial droop and was unable to lift his left heel.  They went to [place] on Friday and he started having symptoms on Saturday night with double vision.  They went to bed at 12 midnight and at 6 AM she noted that he knocked  food on the floor and was later starting to have slurred speech.  He was feeling generally weak and unable to ambulate however without unilateral  focal muscle weakness or paresthesias.  No chest pain or dyspnea or palpitations.  No cough or wheezing.  No urinary or stool incontinence.  CT Angio of head/neck: "No acute intracranial hemorrhage. Evolving areas of recent  infarction are identified in the left thalamus and left ACA  territory. No definite acute infarct.     No new large vessel occlusion. 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 Perfusion imaging demonstrates 4 mL of penumbra within the distal  left ACA territory.".  MRI: Small acute infarcts of the left paramedian frontal lobe and  ventromedial left thalamus. No hemorrhage or mass effect.  UPDATED: 10/13/2020:  Per Neuro, "3 days after admission he developed worsening aphasia and R sided weakness. Repeat MRI brain revealed extension of both his L frontal and brainstem infarcts as well as new watershed infarct in the internal border zone on the L". With this decline in status, pt was transferred to CCU over the weekend(8/20) and made NPO.      SLP Plan  Continue with current plan of care       Recommendations  Diet recommendations: Dysphagia 3 (mechanical soft);Thin liquid (meats minced w/ gravy) Liquids provided via: Cup;No straw Medication Administration: Crushed with puree (vs Whole - assessed by NSG) Supervision: Staff to assist with self feeding;Full supervision/cueing for compensatory strategies Compensations: Minimize environmental distractions;Slow rate;Small sips/bites;Lingual sweep for clearance of pocketing;Multiple dry swallows after each bite/sip;Follow solids with liquid (Time b/t trials to clear orally) Postural Changes and/or Swallow Maneuvers: Out of bed for meals;Seated upright 90 degrees;Upright 30-60 min after meal                General recommendations:  (Dietician f/u for meeting goals/oral needs) Oral Care Recommendations: Oral care BID;Oral care before and  after PO;Staff/trained caregiver to provide oral care Follow up Recommendations: Inpatient Rehab (TBD) SLP Visit Diagnosis: Dysphagia, oropharyngeal phase (R13.12) (Aphasia, Apraxia) Plan: Continue with current plan of care       GO                  Jerilynn Som, MS, CCC-SLP Speech Language Pathologist Rehab Services [phone] [facility] 10/17/2020, 2:22 PM

## 2020-10-17 NOTE — Progress Notes (Signed)
PROGRESS NOTE    Raymond Kerr  RXV:[id number] DOB: Feb 23, 1955 DOA: 10/06/2020 PCP: Barbette Reichmann, MD    Brief Narrative:  Raymond Kerr is a 65 y.o. African-American male with medical history significant for type II diabetes mellitus, GERD and hypertension, who presented to the emergency room with acute onset of diplopia and generalized weakness that started 8 hours prior to presentation.  The patient has been having expressive dysphasia. On arrival to the emergency room here it appears that the patient has left-sided weakness and difficulty with speech.  MRI of the brain showed small acute infarcts of the left paramedian frontal lobe and ventromedial left thalamus. Neurology consult has been obtained. MRA head showed a short segment of the left ACA A2 segment. TEE did not show any thrombus.  Patient had an episode of hypotension, midodrine was started. Repeated CT scan was obtained on 8/19 due to acute unresponsiveness.  Did not show any acute changes. Patient has also been seen by speech therapy, still significant dysphagia.  Recommend PEG tube placement.  Put on dysphagia diet since 8/23.   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  Acute left ACA ischemic stroke. Transient hypotension Dysphagia secondary to stroke. Per family request, patient is seen by neurology again today.  Patient still has significant dysphagia, limited p.o. intake.  Daughter still do not want a feeding tube.  I added megestrol. Will continue to follow.  2.  Type 2 diabetes Added 10 units of Lantus at nighttime, continue sliding scale insulin.  3.  Hypokalemia. Hyponatremia. Hypomagnesemia. Repleted via IV, unreliable oral  intake    DVT prophylaxis: Lovenox Code Status: Full Family Communication: daughter at bedside Disposition Plan:    Status is: Inpatient  Remains inpatient appropriate because:IV treatments appropriate due to intensity of illness or inability to take PO and Inpatient level of care appropriate due to severity of illness  Dispo: The patient is from: Home              Anticipated d/c is to: SNF              Patient currently is not medically stable to d/c.   Difficult to place patient No        No intake/output data recorded. No intake/output data recorded.     Consultants:  Neurology  Procedures: None  Antimicrobials: None  Subjective: Patient still has significant dysphagia, aphasia. No short of breath or cough. Daughter was able to feed patient, he was able to drink Nepro. No abdominal pain or nausea vomiting. No dysuria hematuria.  Objective: Vitals:   10/16/20 2106 10/16/20 2331 10/17/20 0458 10/17/20 0825  BP: (!) 160/77 (!) 147/75 128/64 124/67  Pulse: 80 81 70 71  Resp: 19 18 18 20   Temp: 98.3 F (36.8 C) 98.6 F (37 C) 98.4 F (36.9 C) 98.4 F (36.9 C)  TempSrc:  Oral  Oral  SpO2: 98% 96% 97% 97%  Weight:      Height:       No intake or output data in the 24 hours ending 10/17/20 1413 Filed Weights   10/06/20 2045 10/13/20 0624 10/15/20 2215  Weight: 120.4 kg 121.3 kg 118.3 kg  Examination:  General exam: Appears calm and comfortable  Respiratory system: Clear to auscultation. Respiratory effort normal. Cardiovascular system: S1 & S2 heard, RRR. No JVD, murmurs, rubs, gallops or clicks. No pedal edema. Gastrointestinal system: Abdomen is nondistended, soft and nontender. No organomegaly or masses felt. Normal bowel sounds heard. Central nervous system: Alert.  Left-sided facial droop, aphasic. Extremities: Symmetric 5 x 5 power. Skin: No rashes, lesions or ulcers Psychiatry: Judgement and insight appear normal. Mood & affect  appropriate.     Data Reviewed: I have personally reviewed following labs and imaging studies  CBC: Recent Labs  Lab 10/17/20 0727  WBC 5.2  NEUTROABS 2.9  HGB 12.6*  HCT 37.7*  MCV 89.3  PLT 266   Basic Metabolic Panel: Recent Labs  Lab 10/11/20 0949 10/14/20 0533 10/17/20 0727  NA 135 135 136  K  --  3.8 3.5  CL  --  103 105  CO2  --  25 26  GLUCOSE  --  184* 169*  BUN  --  11 10  CREATININE  --  1.05 0.96  CALCIUM  --  8.8* 8.5*  MG  --   --  1.6*  PHOS  --   --  2.3*   GFR: Estimated Creatinine Clearance: 101.9 mL/min (by C-G formula based on SCr of 0.96 mg/dL). Liver Function Tests: No results for input(s): AST, ALT, ALKPHOS, BILITOT, PROT, ALBUMIN in the last 168 hours. No results for input(s): LIPASE, AMYLASE in the last 168 hours. No results for input(s): AMMONIA in the last 168 hours. Coagulation Profile: No results for input(s): INR, PROTIME in the last 168 hours. Cardiac Enzymes: No results for input(s): CKTOTAL, CKMB, CKMBINDEX, TROPONINI in the last 168 hours. BNP (last 3 results) No results for input(s): PROBNP in the last 8760 hours. HbA1C: No results for input(s): HGBA1C in the last 72 hours. CBG: Recent Labs  Lab 10/16/20 1122 10/16/20 1606 10/16/20 2116 10/16/20 2353 10/17/20 0458  GLUCAP 192* 235* 207* 212* 183*   Lipid Profile: No results for input(s): CHOL, HDL, LDLCALC, TRIG, CHOLHDL, LDLDIRECT in the last 72 hours. Thyroid Function Tests: No results for input(s): TSH, T4TOTAL, FREET4, T3FREE, THYROIDAB in the last 72 hours. Anemia Panel: No results for input(s): VITAMINB12, FOLATE, FERRITIN, TIBC, IRON, RETICCTPCT in the last 72 hours. Sepsis Labs: No results for input(s): PROCALCITON, LATICACIDVEN in the last 168 hours.  Recent Results (from the past 240 hour(s))  MRSA Next Gen by PCR, Nasal     Status: Abnormal   Collection Time: 10/12/20  8:28 AM   Specimen: Nasal Mucosa; Nasal Swab  Result Value Ref Range Status   MRSA  by PCR Next Gen NEGATIVE (A) NOT DETECTED Final    Comment: Performed at [facility], [address] [place], [place] [address]         Radiology Studies: No results found.      Scheduled Meds:   stroke: mapping our early stages of recovery book   Does not apply Once   aspirin  81 mg Oral Daily   atorvastatin  80 mg Oral Daily   Chlorhexidine Gluconate Cloth  6 each Topical Daily   clopidogrel  75 mg Oral Daily   enoxaparin (LOVENOX) injection  0.5 mg/kg Subcutaneous Q24H   feeding supplement (NEPRO CARB STEADY)  237 mL Oral TID WC   insulin aspart  0-15 Units Subcutaneous Q4H   megestrol  400 mg Oral Daily   Continuous Infusions:  dextrose 5 % and 0.45% NaCl  50 mL/hr at 10/16/20 2347     LOS: 10 days    Time spent: 27 minutes    Marrion Coy, MD [facility]   To contact the attending provider between 7A-7P or the covering provider during after hours 7P-7A, please log into the web site [email or web address] and access using universal [place] password for that web site. If you do not have the password, please call the hospital operator.  10/17/2020, 2:13 PM

## 2020-10-18 LAB — PHOSPHORUS: Phosphorus: 3 mg/dL (ref 2.5–4.6)

## 2020-10-18 LAB — BASIC METABOLIC PANEL
Anion gap: 7 (ref 5–15)
BUN: 9 mg/dL (ref 8–23)
CO2: 24 mmol/L (ref 22–32)
Calcium: 8.8 mg/dL — ABNORMAL LOW (ref 8.9–10.3)
Chloride: 105 mmol/L (ref 98–111)
Creatinine, Ser: 1 mg/dL (ref 0.61–1.24)
GFR, Estimated: >60 mL/min (ref >60)
Glucose, Bld: 121 mg/dL — ABNORMAL HIGH (ref 70–99)
Potassium: 3.6 mmol/L (ref 3.5–5.1)
Sodium: 136 mmol/L (ref 135–145)

## 2020-10-18 LAB — GLUCOSE, CAPILLARY
Glucose-Capillary: 137 mg/dL — ABNORMAL HIGH (ref 70–99)
Glucose-Capillary: 121 mg/dL — ABNORMAL HIGH (ref 70–99)
Glucose-Capillary: 145 mg/dL — ABNORMAL HIGH (ref 70–99)
Glucose-Capillary: 157 mg/dL — ABNORMAL HIGH (ref 70–99)
Glucose-Capillary: 136 mg/dL — ABNORMAL HIGH (ref 70–99)

## 2020-10-18 LAB — MAGNESIUM: Magnesium: 1.9 mg/dL (ref 1.7–2.4)

## 2020-10-18 NOTE — Progress Notes (Signed)
PROGRESS NOTE    Raymond Kerr  TTS:[id number] DOB: [birth year] DOA: 10/06/2020 PCP: Barbette Reichmann, MD    Brief Narrative:  Raymond Kerr is a 65 y.o. African-American male with medical history significant for type II diabetes mellitus, GERD and hypertension, who presented to the emergency room with acute onset of diplopia and generalized weakness that started 8 hours prior to presentation.  The patient has been having expressive dysphasia. On arrival to the emergency room here it appears that the patient has left-sided weakness and difficulty with speech.  MRI of the brain showed small acute infarcts of the left paramedian frontal lobe and ventromedial left thalamus. Neurology consult has been obtained. MRA head showed a short segment of the left ACA A2 segment. TEE did not show any thrombus.  Patient had an episode of hypotension, midodrine was started. Repeated CT scan was obtained on 8/19 due to acute unresponsiveness.  Did not show any acute changes. Patient has also been seen by speech therapy, still significant dysphagia.  Recommend PEG tube placement.  Put on dysphagia diet since 8/23.   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  Acute left ACA ischemic stroke. Transient hypotension Dysphagia secondary to stroke. Patient still has significant aphasia and dysphagia.  Daughter is feeding patient 3 meals a day, but intake was not recorded.  I will place a calorie count to see if patient can take adequate p.o. So far, family does not want a feeding tube.  #2.  Hypokalemia. Hyponatremia Hypomagnesemia Recheck labs tomorrow.  3.  Type 2 diabetes.   Continue  current regimen with Lantus and sliding scale insulin.   DVT prophylaxis: Lovenox Code Status: Full Family Communication: daughter at bedside Disposition Plan:      Status is: Inpatient   Remains inpatient appropriate because:IV treatments appropriate due to intensity of illness or inability to take PO and Inpatient level of care appropriate due to severity of illness   Dispo: The patient is from: Home              Anticipated d/c is to: SNF              Patient currently is not medically stable to d/c.              Difficult to place patient No      I/O last 3 completed shifts: In: -  Out: 1675 [Urine:1675] Total I/O In: -  Out: 325 [Urine:325]     Consultants:  None  Procedures: None  Antimicrobials: None  Subjective: Condition is relatively stable.  Daughter is feeding the patient with some liquids, which is not very nutritious.  But he is drinking the Ensure.  I have requested nurse to help with family to order some soft diet. No short of breath or cough. No fever or chills. No dysuria hematuria.  Objective: Vitals:   10/17/20 2348 10/18/20 0400 10/18/20 0802 10/18/20 1132  BP: 138/74 133/72 140/68 (!) 144/76  Pulse: 79 74 72 72  Resp: 16 16 16 20   Temp: 97.9 F (36.6 C) 98.1 F (36.7 C) 98.2 F (36.8 C) 98.1 F (36.7 C)  TempSrc:  SpO2: 98% 99% 100% 94%  Weight:      Height:        Intake/Output Summary (Last 24 hours) at 10/18/2020 1351 Last data filed at 10/18/2020 0900 Gross per 24 hour  Intake --  Output 2000 ml  Net -2000 ml   Filed Weights   10/06/20 2045 10/13/20 0624 10/15/20 2215  Weight: 120.4 kg 121.3 kg 118.3 kg    Examination:  General exam: Appears calm and comfortable  Respiratory system: Clear to auscultation. Respiratory effort normal. Cardiovascular system: S1 & S2 heard, RRR. No JVD, murmurs, rubs, gallops or clicks. No pedal edema. Gastrointestinal system: Abdomen is nondistended, soft and nontender. No  organomegaly or masses felt. Normal bowel sounds heard. Central nervous system: Alert.  Significant dysphagia, left facial droop Extremities: Symmetric 5 x 5 power. Skin: No rashes, lesions or ulcers Psychiatry: Mood & affect appropriate.     Data Reviewed: I have personally reviewed following labs and imaging studies  CBC: Recent Labs  Lab 10/17/20 0727  WBC 5.2  NEUTROABS 2.9  HGB 12.6*  HCT 37.7*  MCV 89.3  PLT 266   Basic Metabolic Panel: Recent Labs  Lab 10/14/20 0533 10/17/20 0727 10/18/20 0828  NA 135 136 136  K 3.8 3.5 3.6  CL 103 105 105  CO2 25 26 24   GLUCOSE 184* 169* 121*  BUN 11 10 9   CREATININE 1.05 0.96 1.00  CALCIUM 8.8* 8.5* 8.8*  MG  --  1.6* 1.9  PHOS  --  2.3* 3.0   GFR: Estimated Creatinine Clearance: 97.8 mL/min (by C-G formula based on SCr of 1 mg/dL). Liver Function Tests: No results for input(s): AST, ALT, ALKPHOS, BILITOT, PROT, ALBUMIN in the last 168 hours. No results for input(s): LIPASE, AMYLASE in the last 168 hours. No results for input(s): AMMONIA in the last 168 hours. Coagulation Profile: No results for input(s): INR, PROTIME in the last 168 hours. Cardiac Enzymes: No results for input(s): CKTOTAL, CKMB, CKMBINDEX, TROPONINI in the last 168 hours. BNP (last 3 results) No results for input(s): PROBNP in the last 8760 hours. HbA1C: No results for input(s): HGBA1C in the last 72 hours. CBG: Recent Labs  Lab 10/17/20 2016 10/17/20 2349 10/18/20 0409 10/18/20 0804 10/18/20 1139  GLUCAP 176* 178* 137* 121* 145*   Lipid Profile: No results for input(s): CHOL, HDL, LDLCALC, TRIG, CHOLHDL, LDLDIRECT in the last 72 hours. Thyroid Function Tests: No results for input(s): TSH, T4TOTAL, FREET4, T3FREE, THYROIDAB in the last 72 hours. Anemia Panel: No results for input(s): VITAMINB12, FOLATE, FERRITIN, TIBC, IRON, RETICCTPCT in the last 72 hours. Sepsis Labs: No results for input(s): PROCALCITON, LATICACIDVEN in the last 168  hours.  Recent Results (from the past 240 hour(s))  MRSA Next Gen by PCR, Nasal     Status: Abnormal   Collection Time: 10/12/20  8:28 AM   Specimen: Nasal Mucosa; Nasal Swab  Result Value Ref Range Status   MRSA by PCR Next Gen NEGATIVE (A) NOT DETECTED Final    Comment: Performed at [facility], [address] [place], [address] [place]         Radiology Studies: No results found.      Scheduled Meds:   stroke: mapping our early stages of recovery book   Does not apply Once   aspirin  81 mg Oral Daily   atorvastatin  80 mg Oral Daily   Chlorhexidine Gluconate Cloth  6 each Topical Daily   clopidogrel  75 mg Oral Daily  enoxaparin (LOVENOX) injection  0.5 mg/kg Subcutaneous Q24H   feeding supplement (NEPRO CARB STEADY)  237 mL Oral TID WC   insulin aspart  0-15 Units Subcutaneous Q4H   insulin glargine-yfgn  10 Units Subcutaneous QHS   megestrol  400 mg Oral Daily   Continuous Infusions:  dextrose 5 % and 0.45% NaCl 50 mL/hr at 10/18/20 0054     LOS: 11 days    Time spent: 28 minutes    Marrion Coy, MD [facility]   To contact the attending provider between 7A-7P or the covering provider during after hours 7P-7A, please log into the web site [email or web address] and access using universal [place] password for that web site. If you do not have the password, please call the hospital operator.  10/18/2020, 1:51 PM

## 2020-10-18 NOTE — Progress Notes (Signed)
Speech Language Pathology Treatment: Dysphagia (education w/ family and pt)  Patient Details Name: Raymond Kerr MRN: [id number] DOB: 12-01-1955 Today's Date: 10/18/2020 Time: 4401-0272 SLP Time Calculation (min) (ACUTE ONLY): 30 min  Assessment / Plan / Recommendation Clinical Impression  Pt seen today for ongoing assessment of swallowing, toleration of recently upgraded of diet consistency to mech soft w/ thin liquids via Cup. Pt continues to present w/ improved alertness/awareness per chart notes and family. He has moved out of CCU(3 days). He verbally engaged w/ SLP and answered a few general Y/N questions re: self as well as attempted verbalizations w/ cues. Pt appears to present w/ Aphasia and potential Apraxia. Pt requires verbal cues to ensure follow through w/ aspiration precautions and self-feeding tasks. Pt is on RA; wbc wnl. Daughter present in room.    Discussed w/ Dtr, [name], the general aspiration precautions which were implemented yesterday w/ the upgrade of the oral diet to mech soft w/ thin liquids. Education completed re: aspiration precautions including checking for oral clearing intermittently; food consistencies and prep; foods/options; feeding support strategies including TIME b/t boluses and mincing meats; moistening foods for ease of mastication also; monitoring oral intake/needs w/ re: to need for supplementing by PEG tube possibly(Dietician following in conjunction).  Support and verbal cues are required; Raymond Kerr stated she felt comfortable following precautions and strategies as she supported her Father's self-feeding and eating/drinking. Pt nodded head in understanding when asked about his swallowing success w/ the upgraded diet; he shook head "no" to concerns. Raymond Kerr denied any overt coughing w/ oral intake. Dtr and pt encouraged to have hime help Hold Cup for drinking to increased safety w/ thin liquids/drinking.    Recommend continue a Dysphagia level 3 diet  (minced meats) w/ gravies added to moisten foods; Thin liquids VIA CUP ONLY. Recommend aspiration precautions; Pills CRUSHED vs Whole in Puree for cohesion and safer swallowing currently; tray setup and positioning assistance for meals w/ support as needed. Support w/ feeding at meals. REFLUX precautions recommended. NSG updated. Family/pt were given education on diet consistency and food options/preparation for ease of mastication for conservation of energy; education on aspiration precautions. Precautions posted at bedside and identified w/ Dtr/pt.      HPI HPI: Pt  is a 65 y.o. African-American male with medical history significant for type II diabetes mellitus, GERD and hypertension, who presented to the emergency room with acute onset of diplopia and generalized weakness that started 8 hours prior to presentation.  The patient has been having expressive dysphasia.  His fiance who accompanied him stated that his symptoms started around midnight on 8/13 with dysarthria and diplopia but he was more confused.  He was noted in the ER to have left facial droop and was unable to lift his left heel.  They went to [place] on Friday and he started having symptoms on Saturday night with double vision.  They went to bed at 12 midnight and at 6 AM she noted that he knocked food on the floor and was later starting to have slurred speech.  He was feeling generally weak and unable to ambulate however without unilateral focal muscle weakness or paresthesias.  No chest pain or dyspnea or palpitations.  No cough or wheezing.  No urinary or stool incontinence.  CT Angio of head/neck: "No acute intracranial hemorrhage. Evolving areas of recent  infarction are identified in the left thalamus and left ACA  territory. No definite acute infarct.     No new large vessel occlusion. Redemonstration  of short segment  occlusion or high-grade stenosis of the left A2 ACA and stenosis of  proximal pericallosal right A3 ACA. Perfusion  imaging demonstrates 4 mL of penumbra within the distal  left ACA territory.".  MRI: Small acute infarcts of the left paramedian frontal lobe and  ventromedial left thalamus. No hemorrhage or mass effect.  UPDATED: 10/13/2020:  Per Neuro, "3 days after admission he developed worsening aphasia and R sided weakness. Repeat MRI brain revealed extension of both his L frontal and brainstem infarcts as well as new watershed infarct in the internal border zone on the L". With this decline in status, pt was transferred to CCU over the weekend(8/20) and made NPO.      SLP Plan  Continue with current plan of care       Recommendations  Diet recommendations: Dysphagia 3 (mechanical soft);Thin liquid (meats minced) Liquids provided via: Cup;No straw Medication Administration: Crushed with puree (vs Whole for ease of swallowing/clearing) Supervision: Staff to assist with self feeding;Full supervision/cueing for compensatory strategies Compensations: Minimize environmental distractions;Slow rate;Small sips/bites;Lingual sweep for clearance of pocketing;Multiple dry swallows after each bite/sip;Follow solids with liquid (Time b/t trials to clear orally) Postural Changes and/or Swallow Maneuvers: Out of bed for meals;Seated upright 90 degrees;Upright 30-60 min after meal                General recommendations:  (Dietician f/u for guidance) Oral Care Recommendations: Oral care BID;Oral care before and after PO;Staff/trained caregiver to provide oral care Follow up Recommendations: Inpatient Rehab SLP Visit Diagnosis: Dysphagia, oropharyngeal phase (R13.12) (Aphasia; Apraxia) Plan: Continue with current plan of care       GO                  Jerilynn Som, MS, CCC-SLP Speech Language Pathologist Rehab Services [phone] [facility] 10/18/2020, 11:40 AM

## 2020-10-18 NOTE — TOC Progression Note (Signed)
Transition of Care [facility]) - Progression Note    Patient Details  Name: Raymond Kerr MRN: [id number] Date of Birth: [birth year]  Transition of Care [facility]) CM/SW Contact  Allayne Butcher, RN Phone Number: 10/18/2020, 4:01 PM  Clinical Narrative:    Patient's daughter is at the bedside with the patient.  RNCM asked if she could verify the patient's SS # because registration is unable to find his Medicare number.  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  [facility] reports that they have an Conservator, museum/gallery listed but unable to verify it.  He has been going to Dr. Marcello Fennel and they have billed insurance.    Discussed DC planning with daughter, would like to see patient progress with therapy and be appropriate for CiR again but RNCN will begin SNF workup as back up plan for discharge.     Expected Discharge Plan: IP Rehab Facility Barriers to Discharge: Continued Medical Work up  Expected Discharge Plan and Services Expected Discharge Plan: IP Rehab Facility   Discharge Planning Services: CM Consult Post Acute Care Choice: IP Rehab Living arrangements for the past 2 months: No permanent address Youth worker)                 DME Arranged: N/A DME Agency: NA       HH Arranged: NA HH Agency: NA         Social Determinants of Health (SDOH) Interventions    Readmission Risk Interventions No flowsheet data found.

## 2020-10-18 NOTE — [place] FL2 (Signed)
[place] MEDICAID FL2 LEVEL OF CARE SCREENING TOOL     IDENTIFICATION  Patient Name: Raymond Kerr Birthdate: 02-13-1956 Sex: male Admission Date (Current Location): 10/06/2020  [facility] and [place] Number:  Chiropodist and Address:  [facility], [address] [place], [place] [address]      Provider Number: [id number]  Attending Physician Name and Address:  Marrion Coy, MD  Relative Name and Phone Number:  Gerren Hoffmeier (daughter) [phone]    Current Level of Care: Hospital Recommended Level of Care: Skilled Nursing Facility Prior Approval Number:    Date Approved/Denied:   PASRR Number: [id number]  Discharge Plan: SNF    Current Diagnoses: Patient Active Problem List   Diagnosis Date Noted   Hypokalemia 10/17/2020   Hypomagnesemia 10/17/2020   Hypophosphatemia 10/17/2020   Pressure injury of skin 10/16/2020   Cerebrovascular accident (CVA) (HCC)    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) 10/07/2020   Paralysis of left third cranial nerve    Right sided weakness    TIA (transient ischemic attack) 10/06/2020   Acute ischemic left ACA stroke (HCC) 10/06/2020   Microhematuria 12/10/2016   Elevated PSA 12/10/2016   Diabetes mellitus (HCC) 11/28/2016   Benign essential hypertension 10/20/2015    Orientation RESPIRATION BLADDER Height & Weight     Self, Situation, Place  Normal Incontinent Weight: 118.3 kg Height:  6' (182.9 cm)  BEHAVIORAL SYMPTOMS/MOOD NEUROLOGICAL BOWEL NUTRITION STATUS      Incontinent Diet (see discharge summary)  AMBULATORY STATUS COMMUNICATION OF NEEDS Skin   Extensive Assist Non-Verbally Normal                       Personal Care Assistance Level of Assistance  Bathing, Feeding, Dressing Bathing Assistance: Maximum assistance Feeding assistance: Maximum assistance Dressing Assistance: Maximum assistance     Functional Limitations Info  Speech, Sight, Hearing  Sight Info: Impaired Hearing Info: Adequate Speech Info: Impaired    SPECIAL CARE FACTORS FREQUENCY  PT (By licensed PT), OT (By licensed OT), Speech therapy     PT Frequency: 5 times per week OT Frequency: 5 times per week     Speech Therapy Frequency: at least 3 times per week      Contractures Contractures Info: Not present    Additional Factors Info  Code Status, Allergies Code Status Info: Full Allergies Info: NKA           Current Medications (10/18/2020):  This is the current hospital active medication list Current Facility-Administered Medications  Medication Dose Route Frequency Provider Last Rate Last Admin    stroke: mapping our early stages of recovery book   Does not apply Once Mansy, Jan A, MD       acetaminophen (TYLENOL) tablet 650 mg  650 mg Oral Q6H PRN Mansy, Jan A, MD       Or   acetaminophen (TYLENOL) suppository 650 mg  650 mg Rectal Q6H PRN Mansy, Jan A, MD       aspirin chewable tablet 81 mg  81 mg Oral Daily Lynn Ito, MD   81 mg at 10/18/20 0959   atorvastatin (LIPITOR) tablet 80 mg  80 mg Oral Daily Marrion Coy, MD   80 mg at 10/18/20 4403   Chlorhexidine Gluconate Cloth 2 % PADS 6 each  6 each Topical Daily Demetrio Lapping, PA-C   6 each at 10/18/20 1001   clopidogrel (PLAVIX) tablet 75 mg  75 mg  Oral Daily Marrion Coy, MD   75 mg at 10/18/20 0959   dextrose 5 %-0.45 % sodium chloride infusion   Intravenous Continuous Lynn Ito, MD 50 mL/hr at 10/18/20 0054 New Bag at 10/18/20 0054   enoxaparin (LOVENOX) injection 60 mg  0.5 mg/kg Subcutaneous Q24H Mansy, Jan A, MD   60 mg at 10/18/20 0959   feeding supplement (NEPRO CARB STEADY) liquid 237 mL  237 mL Oral TID WC Lynn Ito, MD   237 mL at 10/18/20 1001   insulin aspart (novoLOG) injection 0-15 Units  0-15 Units Subcutaneous Q4H Gilles Chiquito, MD   2 Units at 10/18/20 1440   insulin glargine-yfgn (SEMGLEE) injection 10 Units  10 Units Subcutaneous QHS Marrion Coy, MD   10 Units at  10/17/20 2138   magnesium hydroxide (MILK OF MAGNESIA) suspension 30 mL  30 mL Oral Daily PRN Mansy, Jan A, MD       megestrol (MEGACE) 400 MG/10ML suspension 400 mg  400 mg Oral Daily Marrion Coy, MD   400 mg at 10/18/20 0959   ondansetron (ZOFRAN) tablet 4 mg  4 mg Oral Q6H PRN Mansy, Jan A, MD       Or   ondansetron [facility]) injection 4 mg  4 mg Intravenous Q6H PRN Mansy, Jan A, MD       sennosides (SENOKOT) 8.8 MG/5ML syrup 5 mL  5 mL Oral QHS PRN Jefferson Fuel, MD   5 mL at 10/17/20 1747   traZODone (DESYREL) tablet 25 mg  25 mg Oral QHS PRN Mansy, Vernetta Honey, MD         Discharge Medications: Please see discharge summary for a list of discharge medications.  Relevant Imaging Results:  Relevant Lab Results:   Additional Information SS#- [id number]  Allayne Butcher, RN

## 2020-10-18 NOTE — Progress Notes (Signed)
Physical Therapy Treatment Patient Details Name: Raymond Kerr MRN: [id number] DOB: 1955-09-30 Today's Date: 10/18/2020    History of Present Illness Pt is a 65 yo M w/ PMH of HTN, DM2, HL who presented with multiple neurologic sx and was found to have 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 3 days after admission he developed worsening aphasia and R sided weakness, transferred to ICU and underwent repeat imaging. Repeat MRI brain revealed extension of both his L frontal and brainstem infarcts as well as new watershed infarct in the internal border zone on the L. (per chart). Seen as a PT re-evaluation.  Pt was supine in bed with HOB elevated upon arriving. Supportive family in room. Pt is severely aphasic which greatly impacts session progression. He attempts to converse however mumbled. Nods head to yes/no questions however author questions if appropriately responding to questions. He was able to exit bed, stand 2 x EOB prior to stand pivot to recliner. Sat in recliner x 4 hours prior to returning to bed. Max +2 assistance to stand/pivot with max vcs and tactile cues. Author protects RLE from buckling throughout standing trials. Pt very fatigued from minimal activity. He will greatly benefit from continued skilled PT going forward. Acute PT will continue to follow and progress as able per current POC.  PT Comments      Follow Up Recommendations  SNF;Other (comment) (If pt shows more activity tolerance in future sessions, may be CIR appropriate.)     Equipment Recommendations  Other (comment) (defer to next level of care.)       Precautions / Restrictions Precautions Precautions: Fall Restrictions Weight Bearing Restrictions: No    Mobility  Bed Mobility Overal bed mobility: Needs Assistance Bed Mobility: Supine to Sit;Sit to Supine Rolling: Max assist   Supine to sit: Max assist;Total assist Sit to supine: Total assist         Transfers Overall transfer level: Needs assistance Equipment used: 1 person hand held assist Transfers: Stand Pivot Transfers Sit to Stand: +2 physical assistance;+2 safety/equipment;From elevated surface;Max assist;Total assist (+2 max to stand and stand pivot) Stand pivot transfers: Max assist;+2 physical assistance;+2 safety/equipment;From elevated surface;Total assist       General transfer comment: Pt was able to stand 2 x EOB with max assist +2. Author holds RUE and protects RLE from buckling while 2nd person was HHA. He tolerates stabndign ~ 45 sec prior to needing seated rest. Pt was able to tolerate stand pivot to recliner from EOB with pivoting to L only. Pt did reach for armrest of chair when going to recliner. With stand pivot back to bed( towards L) pt used bedrail to pull himself to standing with +2 max assist. Pt very fatigued with minimal activity.  Ambulation/Gait  General Gait Details: unable/unsafe      Balance Overall balance assessment: Needs assistance Sitting-balance support: Feet supported;Single extremity supported Sitting balance-Leahy Scale: Fair Sitting balance - Comments: constant min assist to prevent LOB while seated EOB   Standing balance support: During functional activity Standing balance-Leahy Scale: Poor Standing balance comment: +2 max assist for all standing activity       Cognition Arousal/Alertness: Awake/alert Behavior During Therapy: Flat affect (aphasic. per family," he is always chill.") Overall Cognitive Status: Impaired/Different from baseline Area of Impairment: Problem solving;Following commands      Orientation Level: Person     Following Commands: Follows one step commands inconsistently     Problem Solving: Slow processing;Decreased initiation;Difficulty sequencing;Requires verbal cues;Requires  tactile cues General Comments: Pt is aphasic but was alert and repoding to him name. attempts talking but mumbling. Is able to  nod to yes no questions however author questions if pt really comprehends questions being asked.         General Comments General comments (skin integrity, edema, etc.): Family was educated on importance of promoting R side return and awareness. recommended them stay on him R side to decrease neglect. Discussed rehab at [facility]. He will need extensive therapy going forward      Pertinent Vitals/Pain Pain Assessment: No/denies pain     PT Goals (current goals can now be found in the care plan section) Acute Rehab PT Goals Patient Stated Goal: none stated Progress towards PT goals: Progressing toward goals    Frequency    7X/week      PT Plan Current plan remains appropriate       AM-PAC PT "6 Clicks" Mobility   Outcome Measure  Help needed turning from your back to your side while in a flat bed without using bedrails?: A Lot Help needed moving from lying on your back to sitting on the side of a flat bed without using bedrails?: A Lot Help needed moving to and from a bed to a chair (including a wheelchair)?: Total Help needed standing up from a chair using your arms (e.g., wheelchair or bedside chair)?: Total Help needed to walk in hospital room?: Total Help needed climbing 3-5 steps with a railing? : Total 6 Click Score: 8    End of Session Equipment Utilized During Treatment: Gait belt Activity Tolerance: Patient limited by fatigue Patient left: in bed;with bed alarm set;with family/visitor present;with call bell/phone within reach (pt sat in recliner x >4 hours today) Nurse Communication: Mobility status PT Visit Diagnosis: Other abnormalities of gait and mobility (R26.89);Muscle weakness (generalized) (M62.81);Other symptoms and signs involving the nervous system (R29.898);Hemiplegia and hemiparesis;Difficulty in walking, not elsewhere classified (R26.2) Hemiplegia - Right/Left: Right Hemiplegia - caused by: Cerebral infarction     Time: 6073-7106 PT Time Calculation  (min) (ACUTE ONLY): 26 min  Charges:  $Therapeutic Activity: 8-22 mins $Neuromuscular Re-education: 8-22 mins                     Jetta Lout PTA 10/18/20, 4:34 PM

## 2020-10-19 DIAGNOSIS — I959 Hypotension, unspecified: Secondary | ICD-10-CM

## 2020-10-19 LAB — BASIC METABOLIC PANEL
Anion gap: 7 (ref 5–15)
BUN: 9 mg/dL (ref 8–23)
CO2: 24 mmol/L (ref 22–32)
Calcium: 9 mg/dL (ref 8.9–10.3)
Chloride: 103 mmol/L (ref 98–111)
Creatinine, Ser: 0.98 mg/dL (ref 0.61–1.24)
GFR, Estimated: >60 mL/min (ref >60)
Glucose, Bld: 173 mg/dL — ABNORMAL HIGH (ref 70–99)
Potassium: 3.7 mmol/L (ref 3.5–5.1)
Sodium: 134 mmol/L — ABNORMAL LOW (ref 135–145)

## 2020-10-19 LAB — GLUCOSE, CAPILLARY
Glucose-Capillary: 175 mg/dL — ABNORMAL HIGH (ref 70–99)
Glucose-Capillary: 166 mg/dL — ABNORMAL HIGH (ref 70–99)
Glucose-Capillary: 132 mg/dL — ABNORMAL HIGH (ref 70–99)
Glucose-Capillary: 140 mg/dL — ABNORMAL HIGH (ref 70–99)
Glucose-Capillary: 150 mg/dL — ABNORMAL HIGH (ref 70–99)

## 2020-10-19 LAB — MAGNESIUM: Magnesium: 1.7 mg/dL (ref 1.7–2.4)

## 2020-10-19 LAB — PHOSPHORUS: Phosphorus: 2.6 mg/dL (ref 2.5–4.6)

## 2020-10-19 NOTE — Progress Notes (Signed)
Bipap refused per family member. Pt left in nard. Will follow.

## 2020-10-19 NOTE — Progress Notes (Signed)
And did PROGRESS NOTE    Raymond Kerr  TOI:[id number] DOB: 22-Feb-1956 DOA: 10/06/2020 PCP: Barbette Reichmann, MD   Follow-up on stroke. Brief Narrative:  Raymond Kerr is a 65 y.o. African-American male with medical history significant for type II diabetes mellitus, GERD and hypertension, who presented to the emergency room with acute onset of diplopia and generalized weakness that started 8 hours prior to presentation.  The patient has been having expressive dysphasia. On arrival to the emergency room here it appears that the patient has left-sided weakness and difficulty with speech.  MRI of the brain showed small acute infarcts of the left paramedian frontal lobe and ventromedial left thalamus. Neurology consult has been obtained. MRA head showed a short segment of the left ACA A2 segment. TEE did not show any thrombus.  Patient had an episode of hypotension, midodrine was started. Repeated CT scan was obtained on 8/19 due to acute unresponsiveness.  Did not show any acute changes. Patient has also been seen by speech therapy, still significant dysphagia.  Recommend PEG tube placement.  Put on dysphagia diet since 8/23.   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  Acute left ACA ischemic stroke. Transient hypotension Dysphagia secondary to stroke. Patient still has residual symptoms of dysphagia and aphasia. Spoke with the daughter, patient has been fed by his daughter 3 times a day.  He is on Ensure 3 times a day. I have placed a calorie count to make sure he has adequate p.o. intake.  He did not seem to have any aspiration event. Daughter does not want a feeding  tube.  At this point we will continue tube feeding.  Patient can be transferred to nursing home when bed available.    #2.  Hypokalemia. Hyponatremia Hypomagnesemia Condition improved  #3.  Type 2 diabetes. Continue current regimen.   DVT prophylaxis: Lovenox Code Status: full Family Communication: Daughter updated at bedside. Disposition Plan:    Status is: Inpatient  Remains inpatient appropriate because:Unsafe d/c plan  Dispo: The patient is from: Home              Anticipated d/c is to: SNF              Patient currently is medically stable to d/c.   Difficult to place patient No        I/O last 3 completed shifts: In: -  Out: 1050 [Urine:1050] No intake/output data recorded.     Consultants:  Neurology has signed off  Procedures: None  Antimicrobials: None  Subjective: Patient still aphasic, but seems to be tolerating eating better. No nausea vomiting abdominal pain. No dysuria hematuria  No fever or chills  Objective: Vitals:   10/18/20 1540 10/18/20 2031 10/19/20 0641 10/19/20 0908  BP: (!) 147/88 138/66 130/83 132/90  Pulse: 75 75 82 76  Resp: 16 16 16 18   Temp: 98.7 F (37.1 C) 98.3 F (36.8 C) 98.1 F (36.7 C) 98.6 F (37 C)  TempSrc:      SpO2: 100% 98% 100% 100%  Weight:      Height:       No intake or output  data in the 24 hours ending 10/19/20 1024 Filed Weights   10/06/20 2045 10/13/20 0624 10/15/20 2215  Weight: 120.4 kg 121.3 kg 118.3 kg    Examination:  General exam: Appears calm and comfortable  Respiratory system: Clear to auscultation. Respiratory effort normal. Cardiovascular system: S1 & S2 heard, RRR. No JVD, murmurs, rubs, gallops or clicks. No pedal edema. Gastrointestinal system: Abdomen is nondistended, soft and nontender. No organomegaly or masses felt. Normal bowel sounds heard. Central nervous system: Alert and aphasic.  Left facial droop. Extremities: Symmetric 5 x 5 power. Skin: No rashes, lesions or  ulcers Psychiatry: Judgement and insight appear normal. Mood & affect appropriate.     Data Reviewed: I have personally reviewed following labs and imaging studies  CBC: Recent Labs  Lab 10/17/20 0727  WBC 5.2  NEUTROABS 2.9  HGB 12.6*  HCT 37.7*  MCV 89.3  PLT 266   Basic Metabolic Panel: Recent Labs  Lab 10/14/20 0533 10/17/20 0727 10/18/20 0828 10/19/20 0430  NA 135 136 136 134*  K 3.8 3.5 3.6 3.7  CL 103 105 105 103  CO2 25 26 24 24   GLUCOSE 184* 169* 121* 173*  BUN 11 10 9 9   CREATININE 1.05 0.96 1.00 0.98  CALCIUM 8.8* 8.5* 8.8* 9.0  MG  --  1.6* 1.9 1.7  PHOS  --  2.3* 3.0 2.6   GFR: Estimated Creatinine Clearance: 99.8 mL/min (by C-G formula based on SCr of 0.98 mg/dL). Liver Function Tests: No results for input(s): AST, ALT, ALKPHOS, BILITOT, PROT, ALBUMIN in the last 168 hours. No results for input(s): LIPASE, AMYLASE in the last 168 hours. No results for input(s): AMMONIA in the last 168 hours. Coagulation Profile: No results for input(s): INR, PROTIME in the last 168 hours. Cardiac Enzymes: No results for input(s): CKTOTAL, CKMB, CKMBINDEX, TROPONINI in the last 168 hours. BNP (last 3 results) No results for input(s): PROBNP in the last 8760 hours. HbA1C: No results for input(s): HGBA1C in the last 72 hours. CBG: Recent Labs  Lab 10/18/20 1139 10/18/20 1539 10/18/20 2033 10/19/20 0454 10/19/20 0852  GLUCAP 145* 157* 136* 175* 166*   Lipid Profile: No results for input(s): CHOL, HDL, LDLCALC, TRIG, CHOLHDL, LDLDIRECT in the last 72 hours. Thyroid Function Tests: No results for input(s): TSH, T4TOTAL, FREET4, T3FREE, THYROIDAB in the last 72 hours. Anemia Panel: No results for input(s): VITAMINB12, FOLATE, FERRITIN, TIBC, IRON, RETICCTPCT in the last 72 hours. Sepsis Labs: No results for input(s): PROCALCITON, LATICACIDVEN in the last 168 hours.  Recent Results (from the past 240 hour(s))  MRSA Next Gen by PCR, Nasal     Status: Abnormal    Collection Time: 10/12/20  8:28 AM   Specimen: Nasal Mucosa; Nasal Swab  Result Value Ref Range Status   MRSA by PCR Next Gen NEGATIVE (A) NOT DETECTED Final    Comment: Performed at [facility], [address], [place], [address] [place]         Radiology Studies: No results found.      Scheduled Meds:   stroke: mapping our early stages of recovery book   Does not apply Once   aspirin  81 mg Oral Daily   atorvastatin  80 mg Oral Daily   Chlorhexidine Gluconate Cloth  6 each Topical Daily   clopidogrel  75 mg Oral Daily   enoxaparin (LOVENOX) injection  0.5 mg/kg Subcutaneous Q24H   feeding supplement (NEPRO CARB STEADY)  237 mL Oral TID WC   insulin aspart  0-15 Units Subcutaneous Q4H   insulin glargine-yfgn  10 Units Subcutaneous QHS   megestrol  400 mg Oral Daily   Continuous Infusions:  dextrose 5 % and 0.45% NaCl 50 mL/hr at 10/18/20 1947     LOS: 12 days    Time spent: 28 minutes    Marrion Coy, MD [facility]   To contact the attending provider between 7A-7P or the covering provider during after hours 7P-7A, please log into the web site [email or web address] and access using universal [employer] password for that web site. If you do not have the password, please call the hospital operator.  10/19/2020, 10:24 AM

## 2020-10-19 NOTE — Progress Notes (Signed)
Physical Therapy Treatment Patient Details Name: Raymond Kerr MRN: [id number] DOB: 02/14/1956 Today's Date: 10/19/2020    History of Present Illness Pt is a 65 yo M w/ PMH of HTN, DM2, HL who presented with multiple neurologic sx and was found to have 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 3 days after admission he developed worsening aphasia and R sided weakness, transferred to ICU and underwent repeat imaging. Repeat MRI brain revealed extension of both his L frontal and brainstem infarcts as well as new watershed infarct in the internal border zone on the L. (per chart). Seen as a PT re-evaluation.    PT Comments    Pt seen for PT tx with session focusing on bed mobility, transfers, balance, & R NMR. Pt requires max assist for supine>sit but does participate in moving trunk to upright position & BLE to EOB. Pt completes lateral scoot bed>recliner on L with +1 max assist (would benefit from +2 for safety) with decreased ability to follow commands to attempt squat pivot; pt with decreased safety awareness during transfer & PT provides multimodal cuing for technique. In hallway, attempted to assist pt with sit>stand at rail but pt unable to achieve movement with max assist +1 (pt also limited by low seat height as pt is 6 ft tall). Pt would benefit from ongoing PT services to progress bed mobility, transfers, & standing tolerance as able. Pt does have friends/family that seem supportive (nurse reports they stay all day; they did arrive during session but allowed pt time to work with PT so exited session) & pt would benefit from intense rehab services in CIR setting to help facilitate return to PLOF as pt was independent & driving a truck PTA.   Follow Up Recommendations  CIR     Equipment Recommendations  None recommended by PT (TBD in next venue)    Recommendations for Other Services Rehab consult     Precautions / Restrictions  Precautions Precautions: Fall Precaution Comments: R hemi Restrictions Weight Bearing Restrictions: No    Mobility  Bed Mobility Overal bed mobility: Needs Assistance Bed Mobility: Supine to Sit     Supine to sit: Max assist;HOB elevated     General bed mobility comments: assistance to move BLE to EOB & upright trunk with HOB elevated & bed rails but pt able to initiate & participate in uprighting trunk    Transfers Overall transfer level: Needs assistance Equipment used: None Transfers: Lateral/Scoot Transfers          Lateral/Scoot Transfers: Max assist;Total assist General transfer comment: lateral scoot to recliner on L with pt demonstrating impaired awareness & reduced ability to follow commands, PT attempts to cue pt to lift buttocks to perform squat pivot but pt continues scooting despite PT attempts. Pt requires MAX cuing/assistance to scoot back & reposition in chair.  Ambulation/Gait                 Stairs             Wheelchair Mobility    Modified Rankin (Stroke Patients Only)       Balance Overall balance assessment: Needs assistance Sitting-balance support: Feet supported;Single extremity supported Sitting balance-Leahy Scale: Fair                                      Cognition Arousal/Alertness: Awake/alert Behavior During Therapy: Flat affect Overall Cognitive Status: Impaired/Different  from baseline Area of Impairment: Problem solving;Following commands;Orientation;Attention;Safety/judgement;Awareness;Memory                 Orientation Level: Person   Memory: Decreased recall of precautions;Decreased short-term memory Following Commands: Follows one step commands inconsistently Safety/Judgement: Decreased awareness of safety;Decreased awareness of deficits Awareness: Intellectual;Emergent;Anticipatory Problem Solving: Slow processing;Decreased initiation;Difficulty sequencing;Requires verbal cues;Requires  tactile cues General Comments: Aphasic, decreased awareness & ability to follow simple commands.      Exercises      General Comments General comments (skin integrity, edema, etc.): Attempted to focus on R attention to locate call bell on R. Pt demonstrates R neglect & requires mulitmodal cuing to attend to that side.      Pertinent Vitals/Pain Pain Assessment: Faces Faces Pain Scale: No hurt    Home Living                      Prior Function            PT Goals (current goals can now be found in the care plan section) Acute Rehab PT Goals Patient Stated Goal: none stated PT Goal Formulation: With patient Time For Goal Achievement: 10/28/20 Potential to Achieve Goals: Fair Progress towards PT goals: Progressing toward goals    Frequency    7X/week      PT Plan Discharge plan needs to be updated    Co-evaluation              AM-PAC PT "6 Clicks" Mobility   Outcome Measure  Help needed turning from your back to your side while in a flat bed without using bedrails?: Total Help needed moving from lying on your back to sitting on the side of a flat bed without using bedrails?: Total Help needed moving to and from a bed to a chair (including a wheelchair)?: Total Help needed standing up from a chair using your arms (e.g., wheelchair or bedside chair)?: Total Help needed to walk in hospital room?: Total Help needed climbing 3-5 steps with a railing? : Total 6 Click Score: 6    End of Session Equipment Utilized During Treatment: Gait belt Activity Tolerance: Patient tolerated treatment well Patient left: in chair;with call bell/phone within reach Nurse Communication:  (notified nursing staff of need for single push button call bell as pt with difficulty locating one on his R; educated nursing staff to use lift to assist pt back to bed for increased safety) PT Visit Diagnosis: Other abnormalities of gait and mobility (R26.89);Muscle weakness  (generalized) (M62.81);Other symptoms and signs involving the nervous system (R29.898);Hemiplegia and hemiparesis;Difficulty in walking, not elsewhere classified (R26.2) Hemiplegia - Right/Left: Right Hemiplegia - caused by: Cerebral infarction     Time: 1043-1106 PT Time Calculation (min) (ACUTE ONLY): 23 min  Charges:  $Therapeutic Activity: 8-22 mins $Neuromuscular Re-education: 8-22 mins                     Aleda Grana, PT, DPT 10/19/20, 12:51 PM    Sandi Mariscal 10/19/2020, 12:46 PM

## 2020-10-19 NOTE — Progress Notes (Addendum)
Physical Therapy Treatment Patient Details Name: Raymond Kerr MRN: [id number] DOB: March 24, 1955 Today's Date: 10/19/2020    History of Present Illness Pt is a 65 yo M w/ PMH of HTN, DM2, HL who presented with multiple neurologic sx and was found to have 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 3 days after admission he developed worsening aphasia and R sided weakness, transferred to ICU and underwent repeat imaging. Repeat MRI brain revealed extension of both his L frontal and brainstem infarcts as well as new watershed infarct in the internal border zone on the L. (per chart). Seen as a PT re-evaluation.    PT Comments    Pt seen for PT tx with focus on chair>bed transfer as pt had been sitting up all day. PT repositioned chair to allow more safe lateral scoot to L from drop arm recliner with pt requiring MAX assist for anterior weight shifting for head/hips relationship to reposition in chair. Pt with difficulty coordinating/sequencing movement & grandson present to assist so pt held to grandson's hand with LUE & pt completed stand pivot chair>bed with max assist +2 with assistance to power to standing & for pivoting to bed. Pt requires +2 assist for sit>supine & scooting to HOB. Pt's daughter [name]) also present; educated family on need to position themselves on pt's R side to promote looking to R 2/2 inattention/neglect. [name] reports she can provide 24 hr supervision after pt goes to rehab. Will continue to follow pt acutely to progress mobility as able.     Follow Up Recommendations  CIR     Equipment Recommendations  None recommended by PT    Recommendations for Other Services Rehab consult     Precautions / Restrictions Precautions Precautions: Fall Precaution Comments: R hemi Restrictions Weight Bearing Restrictions: No    Mobility  Bed Mobility Overal bed mobility: Needs Assistance Bed Mobility: Sit to Supine     Supine to sit:  Max assist;HOB elevated Sit to supine: Max assist;+2 for physical assistance   General bed mobility comments: assistance to lower trunk & elevate BLE onto bed, +2 to scoot to [facility]    Transfers Overall transfer level: Needs assistance Equipment used: 2 person hand held assist Transfers: Stand Pivot Transfers   Stand pivot transfers: Max assist;+2 physical assistance      Lateral/Scoot Transfers: Max assist;Total assist General transfer comment: Pt's grandson assists with transfer with pt holding to grandson's hand with LUE; pt requires +2 for sit>stand & to safely to pivot to bed on L from drop arm recliner; PT provides manual facilitation for anterior weight shift prior to transfer  Ambulation/Gait                 Stairs             Wheelchair Mobility    Modified Rankin (Stroke Patients Only)       Balance Overall balance assessment: Needs assistance Sitting-balance support: Feet supported;Single extremity supported Sitting balance-Leahy Scale: Fair                                      Cognition Arousal/Alertness: Awake/alert Behavior During Therapy: Flat affect Overall Cognitive Status: Impaired/Different from baseline Area of Impairment: Problem solving;Following commands;Orientation;Attention;Safety/judgement;Awareness;Memory                 Orientation Level: Person   Memory: Decreased recall of precautions;Decreased short-term memory Following Commands:  Follows one step commands inconsistently Safety/Judgement: Decreased awareness of safety;Decreased awareness of deficits Awareness: Intellectual;Emergent;Anticipatory Problem Solving: Slow processing;Decreased initiation;Difficulty sequencing;Requires verbal cues;Requires tactile cues General Comments: Aphasic, decreased awareness & ability to follow simple commands.      Exercises      General Comments General comments (skin integrity, edema, etc.): Attempted to focus on  R attention to locate call bell on R. Pt demonstrates R neglect & requires mulitmodal cuing to attend to that side.      Pertinent Vitals/Pain Pain Assessment: Faces Faces Pain Scale: No hurt    Home Living                      Prior Function            PT Goals (current goals can now be found in the care plan section) Acute Rehab PT Goals Patient Stated Goal: rehab PT Goal Formulation: With patient/family Time For Goal Achievement: 10/28/20 Potential to Achieve Goals: Fair Progress towards PT goals: Progressing toward goals    Frequency    7X/week      PT Plan Current plan remains appropriate    Co-evaluation              AM-PAC PT "6 Clicks" Mobility   Outcome Measure  Help needed turning from your back to your side while in a flat bed without using bedrails?: Total Help needed moving from lying on your back to sitting on the side of a flat bed without using bedrails?: Total Help needed moving to and from a bed to a chair (including a wheelchair)?: Total Help needed standing up from a chair using your arms (e.g., wheelchair or bedside chair)?: Total Help needed to walk in hospital room?: Total Help needed climbing 3-5 steps with a railing? : Total 6 Click Score: 6    End of Session Equipment Utilized During Treatment: Gait belt Activity Tolerance: Patient tolerated treatment well;Patient limited by fatigue Patient left: in bed;with call bell/phone within reach;with bed alarm set;with family/visitor present (in bed in chair position) Nurse Communication:  (need to use hoyer lift for OOB transfers) PT Visit Diagnosis: Other abnormalities of gait and mobility (R26.89);Muscle weakness (generalized) (M62.81);Other symptoms and signs involving the nervous system (R29.898);Hemiplegia and hemiparesis;Difficulty in walking, not elsewhere classified (R26.2) Hemiplegia - Right/Left: Right Hemiplegia - caused by: Cerebral infarction     Time: 4196-2229 PT  Time Calculation (min) (ACUTE ONLY): 14 min  Charges:  $Neuromuscular Re-education: 8-22 mins                     Aleda Grana, PT, DPT 10/19/20, 3:46 PM    Sandi Mariscal 10/19/2020, 3:43 PM

## 2020-10-20 DIAGNOSIS — E785 Hyperlipidemia, unspecified: Secondary | ICD-10-CM

## 2020-10-20 DIAGNOSIS — E1169 Type 2 diabetes mellitus with other specified complication: Secondary | ICD-10-CM

## 2020-10-20 LAB — BASIC METABOLIC PANEL
Anion gap: 8 (ref 5–15)
BUN: 9 mg/dL (ref 8–23)
CO2: 22 mmol/L (ref 22–32)
Calcium: 8.8 mg/dL — ABNORMAL LOW (ref 8.9–10.3)
Chloride: 103 mmol/L (ref 98–111)
Creatinine, Ser: 1.03 mg/dL (ref 0.61–1.24)
GFR, Estimated: >60 mL/min (ref >60)
Glucose, Bld: 122 mg/dL — ABNORMAL HIGH (ref 70–99)
Potassium: 3.7 mmol/L (ref 3.5–5.1)
Sodium: 133 mmol/L — ABNORMAL LOW (ref 135–145)

## 2020-10-20 LAB — MAGNESIUM: Magnesium: 1.7 mg/dL (ref 1.7–2.4)

## 2020-10-20 LAB — PHOSPHORUS: Phosphorus: 2.6 mg/dL (ref 2.5–4.6)

## 2020-10-20 LAB — GLUCOSE, CAPILLARY
Glucose-Capillary: 139 mg/dL — ABNORMAL HIGH (ref 70–99)
Glucose-Capillary: 148 mg/dL — ABNORMAL HIGH (ref 70–99)
Glucose-Capillary: 117 mg/dL — ABNORMAL HIGH (ref 70–99)
Glucose-Capillary: 156 mg/dL — ABNORMAL HIGH (ref 70–99)
Glucose-Capillary: 192 mg/dL — ABNORMAL HIGH (ref 70–99)
Glucose-Capillary: 203 mg/dL — ABNORMAL HIGH (ref 70–99)

## 2020-10-20 NOTE — TOC Progression Note (Signed)
Transition of Care [facility]) - Progression Note    Patient Details  Name: Raymond Kerr MRN: [id number] Date of Birth: [birth year]  [facility] CM/SW Contact  Margarito Liner, LCSW Phone Number: 10/20/2020, 8:29 AM  Clinical Narrative:   No bed offers this morning.  Expected Discharge Plan: IP Rehab Facility Barriers to Discharge: Continued Medical Work up  Expected Discharge Plan and Services Expected Discharge Plan: IP Rehab Facility   Discharge Planning Services: CM Consult Post Acute Care Choice: IP Rehab Living arrangements for the past 2 months: No permanent address Youth worker)                 DME Arranged: N/A DME Agency: NA       HH Arranged: NA HH Agency: NA         Social Determinants of Health (SDOH) Interventions    Readmission Risk Interventions No flowsheet data found.

## 2020-10-20 NOTE — Progress Notes (Signed)
Inpatient Rehab Admissions Coordinator Note:   Per PT request, patient was re-screened for CIR candidacy by Stephania Fragmin, PT. Appears pt has a dispo and is working well with PT. I will place an order for rehab consult for full assessment, per our protocol, and an admissions coordinator will f/u.  Please contact me any with questions.Estill Dooms, PT, DPT [phone] 10/20/20 11:42 AM

## 2020-10-20 NOTE — Progress Notes (Signed)
Occupational Therapy Treatment Patient Details Name: Raymond Kerr MRN: [id number] DOB: Apr 06, 1955 Today's Date: 10/20/2020    History of present illness Pt is a 65 yo M w/ PMH of HTN, DM2, HL who presented with multiple neurologic sx and was found to have 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 3 days after admission he developed worsening aphasia and R sided weakness, transferred to ICU and underwent repeat imaging. Repeat MRI brain revealed extension of both his L frontal and brainstem infarcts as well as new watershed infarct in the internal border zone on the L. (per chart).   OT comments  Pt seated in recliner chair with daughter present in the room. This is pt's second therapy session and he has been sitting up in recliner chair for several hours since PT. Pt unable to stand safely from recliner chair. Lateral scoot transfer from recliner chair >bed towards the L with max A of 2. Pt seated on EOB with min A and is displaying pushing towards the R. Pt needing total A to return to supine and for positioning. OT asking pt about sensation in R UE and when asked if it feels numb he does verbalize, " A little bit". Pt consistently nods head yes or no and attempts to say these words. OT performed PROM to R UE in all planes of movement x 5 reps while educating caregiver on techniques, OT purpose, and recommendations. Pt does track to R side with min - mod multimodal cuing. Pt making excellent progress this session. All needs within reach. OT continues to recommend intensive rehab program to address functional deficits.    Follow Up Recommendations  CIR    Equipment Recommendations  Other (comment) (defer to next venue of care)       Precautions / Restrictions Precautions Precautions: Fall Precaution Comments: R hemi Restrictions Weight Bearing Restrictions: No       Mobility Bed Mobility Overal bed mobility: Needs Assistance Bed Mobility: Sit to  Supine       Sit to supine: Total assist        Transfers Overall transfer level: Needs assistance Equipment used: 2 person hand held assist Transfers: Lateral/Scoot Transfers          Lateral/Scoot Transfers: Max assist;+2 physical assistance General transfer comment: Bed > recliner to L. Pt unable to stand from recliner chair secondary to fatigue.    Balance Overall balance assessment: Needs assistance Sitting-balance support: Feet supported;Single extremity supported Sitting balance-Leahy Scale: Fair Sitting balance - Comments: min A for static sitting balance Postural control: Right lateral lean Standing balance support: During functional activity Standing balance-Leahy Scale: Zero                             ADL either performed or assessed with clinical judgement   ADL Overall ADL's : Needs assistance/impaired     Grooming: Wash/dry face;Wash/dry hands;Bed level;Set up;Supervision/safety Grooming Details (indicate cue type and reason): Pt washes face with increased time to initiate and sequence and use of L UE to complete task.                                     Vision Patient Visual Report: Blurring of vision;Diplopia            Cognition Arousal/Alertness: Awake/alert Behavior During Therapy: Flat affect Overall Cognitive Status: Impaired/Different from baseline  Area of Impairment: Problem solving;Following commands;Orientation;Attention;Safety/judgement;Awareness;Memory                 Orientation Level: Person Current Attention Level: Focused Memory: Decreased recall of precautions;Decreased short-term memory Following Commands: Follows one step commands inconsistently;Follows one step commands with increased time Safety/Judgement: Decreased awareness of safety;Decreased awareness of deficits Awareness: Intellectual Problem Solving: Slow processing;Decreased initiation;Difficulty sequencing;Requires verbal  cues;Requires tactile cues                     Pertinent Vitals/ Pain       Pain Assessment: Faces Faces Pain Scale: Hurts a little bit Pain Location: L knee Pain Descriptors / Indicators: Grimacing Pain Intervention(s): Monitored during session;Repositioned   Frequency  Min 3X/week        Progress Toward Goals  OT Goals(current goals can now be found in the care plan section)  Progress towards OT goals: Progressing toward goals  Acute Rehab OT Goals Patient Stated Goal: rehab OT Goal Formulation: With family Time For Goal Achievement: 10/28/20 Potential to Achieve Goals: Good  Plan Discharge plan remains appropriate;Frequency remains appropriate       AM-PAC OT "6 Clicks" Daily Activity     Outcome Measure   Help from another person eating meals?: Total Help from another person taking care of personal grooming?: Total Help from another person toileting, which includes using toliet, bedpan, or urinal?: Total Help from another person bathing (including washing, rinsing, drying)?: Total Help from another person to put on and taking off regular upper body clothing?: Total Help from another person to put on and taking off regular lower body clothing?: Total 6 Click Score: 6    End of Session    OT Visit Diagnosis: Other abnormalities of gait and mobility (R26.89);Hemiplegia and hemiparesis Hemiplegia - Right/Left: Right Hemiplegia - dominant/non-dominant: Dominant Hemiplegia - caused by: Cerebral infarction   Activity Tolerance Patient limited by fatigue   Patient Left in bed;with call bell/phone within reach;with bed alarm set;with family/visitor present   Nurse Communication Mobility status        Time: 8811-0315 OT Time Calculation (min): 29 min  Charges: OT General Charges $OT Visit: 1 Visit OT Treatments $Neuromuscular Re-education: 23-37 mins  Jackquline Denmark, MS, OTR/L , CBIS ascom [phone]  10/20/20, 2:25 PM

## 2020-10-20 NOTE — Progress Notes (Addendum)
Physical Therapy Treatment Patient Details Name: Raymond Kerr MRN: [id number] DOB: 03/25/1955 Today's Date: 10/20/2020    History of Present Illness Pt is a 65 yo M w/ PMH of HTN, DM2, HL who presented with multiple neurologic sx and was found to have 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 3 days after admission he developed worsening aphasia and R sided weakness, transferred to ICU and underwent repeat imaging. Repeat MRI brain revealed extension of both his L frontal and brainstem infarcts as well as new watershed infarct in the internal border zone on the L. (per chart).    PT Comments    Pt alert, in bed with daughter, [name], present throughout treatment. Pt requires max-A for supine > sit w/ patient participation using LUE/LLE w/ multimodal cues. Max-A + 2 physical assist bed > recliner going L, pt requires redirection of task to utilize LLE for pushing off bed to facilitate lifting bottom prior to reaching to recliner. Pt progressed to ambulation, Max-A + 2 physical assist w/ LUE using L hallway handrail (+1 to L side for facilitation of stepping w/ LLE; RUE w/ +1 physical assist requiring total assist for RLE swing & max-A for R knee block during stance.) Pt able to walk 3 ft with seated rest break (HR 121) and 5 ft following rest break. Skilled PT intervention is indicated to address deficits in function, mobility, and to return to PLOF as able.  CIR remains current recommendation to maximize return to PLOF as able.    Follow Up Recommendations  CIR     Equipment Recommendations  None recommended by PT    Recommendations for Other Services       Precautions / Restrictions Precautions Precautions: Fall Precaution Comments: R hemi Restrictions Weight Bearing Restrictions: No    Mobility  Bed Mobility Overal bed mobility: Needs Assistance Bed Mobility: Supine to Sit     Supine to sit: Max assist;HOB elevated     General bed  mobility comments: assist to BLE, scooting, and trunk elevation    Transfers Overall transfer level: Needs assistance Equipment used: 2 person hand held assist Transfers: Sit to/from Stand;Lateral/Scoot Transfers Sit to Stand: +2 physical assistance;+2 safety/equipment;Max assist        Lateral/Scoot Transfers: Max assist;+2 physical assistance General transfer comment: Bed > recliner to L: pt reaches with L arm to chair, cues for pushing off bed to facilate lifting bottom; sit <> stand  Ambulation/Gait Ambulation/Gait assistance: Max assist;+2 safety/equipment;+2 physical assistance;Total assist Gait Distance (Feet): 8 Feet Assistive device: 2 person hand held assist Gait Pattern/deviations: Step-to pattern;Trunk flexed Gait velocity: decreased   General Gait Details: LUE to L hand rail w/ cues for UE advancement & +1 to L side for facilitation of stepping w/ LLE; RUE w/ 1 physical assist requiring total assist for RLE swing & max-A for R knee block during stance   Stairs             Wheelchair Mobility    Modified Rankin (Stroke Patients Only)       Balance Overall balance assessment: Needs assistance Sitting-balance support: Feet supported;Single extremity supported Sitting balance-Leahy Scale: Fair       Standing balance-Leahy Scale: Poor Standing balance comment: Requires max-A + 2 for static stance                            Cognition Arousal/Alertness: Awake/alert Behavior During Therapy: Flat affect Overall Cognitive Status: Impaired/Different  from baseline Area of Impairment: Problem solving;Following commands;Orientation;Attention;Safety/judgement;Awareness;Memory                 Orientation Level: Person   Memory: Decreased recall of precautions;Decreased short-term memory Following Commands: Follows one step commands inconsistently;Follows one step commands with increased time Safety/Judgement: Decreased awareness of  safety;Decreased awareness of deficits Awareness: Intellectual;Emergent;Anticipatory Problem Solving: Slow processing;Decreased initiation;Difficulty sequencing;Requires verbal cues;Requires tactile cues General Comments: Follows simple commands intermittently      Exercises      General Comments        Pertinent Vitals/Pain Pain Assessment: Faces Faces Pain Scale: Hurts a little bit Pain Location: L knee Pain Descriptors / Indicators: Grimacing Pain Intervention(s): Limited activity within patient's tolerance;Monitored during session    Home Living                      Prior Function            PT Goals (current goals can now be found in the care plan section) Acute Rehab PT Goals Patient Stated Goal: rehab PT Goal Formulation: With patient/family Time For Goal Achievement: 10/28/20 Potential to Achieve Goals: Fair Progress towards PT goals: Progressing toward goals    Frequency    7X/week      PT Plan Current plan remains appropriate    Co-evaluation              AM-PAC PT "6 Clicks" Mobility   Outcome Measure  Help needed turning from your back to your side while in a flat bed without using bedrails?: A Lot Help needed moving from lying on your back to sitting on the side of a flat bed without using bedrails?: Total Help needed moving to and from a bed to a chair (including a wheelchair)?: Total Help needed standing up from a chair using your arms (e.g., wheelchair or bedside chair)?: Total Help needed to walk in hospital room?: Total Help needed climbing 3-5 steps with a railing? : Total 6 Click Score: 7    End of Session Equipment Utilized During Treatment: Gait belt Activity Tolerance: Patient tolerated treatment well;Patient limited by fatigue Patient left: in chair;with call bell/phone within reach;with family/visitor present;with chair alarm set Nurse Communication: Mobility status PT Visit Diagnosis: Other abnormalities of gait  and mobility (R26.89);Muscle weakness (generalized) (M62.81);Other symptoms and signs involving the nervous system (R29.898);Hemiplegia and hemiparesis;Difficulty in walking, not elsewhere classified (R26.2) Hemiplegia - Right/Left: Right Hemiplegia - caused by: Cerebral infarction     Time: 4270-6237 PT Time Calculation (min) (ACUTE ONLY): 34 min  Charges:                        [employer], SPT

## 2020-10-20 NOTE — Progress Notes (Signed)
PROGRESS NOTE    Raymond Kerr  ZOX:[id number] DOB: 05-20-1955 DOA: 10/06/2020 PCP: Barbette Reichmann, MD   Follow-up on stroke. Brief Narrative:   Raymond Kerr is a 65 y.o. African-American male with medical history significant for type II diabetes mellitus, GERD and hypertension, who presented to the emergency room with acute onset of diplopia and generalized weakness that started 8 hours prior to presentation.  The patient has been having expressive dysphasia. On arrival to the emergency room here it appears that the patient has left-sided weakness and difficulty with speech.  MRI of the brain showed small acute infarcts of the left paramedian frontal lobe and ventromedial left thalamus. Neurology consult has been obtained. MRA head showed a short segment of the left ACA A2 segment. TEE did not show any thrombus.  Patient had an episode of hypotension, midodrine was started. Repeated CT scan was obtained on 8/19 due to acute unresponsiveness.  Did not show any acute changes. Patient has also been seen by speech therapy, still significant dysphagia.  Recommend PEG tube placement.  Put on dysphagia diet since 8/23.  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  Acute left ACA ischemic stroke. Transient hypotension Dysphagia secondary to stroke. Daughter was able to feed him 3 ensures a day, in addition to that, he was eating soft food.  No evidence of aspiration at this time. It looks like he will not need a feeding tube.  #2.  Type 2 diabetes Continue current regimen, will adjust insulin dose as needed.  DVT prophylaxis: Lovenox Code Status:  full Family Communication: Daughter updated at bedside. Disposition Plan:      Status is: Inpatient   Remains inpatient appropriate because:Unsafe d/c plan   Dispo: The patient is from: Home              Anticipated d/c is to: SNF              Patient currently is medically stable to d/c.              Difficult to place patient No    Discussed with social worker, patient does not have insurance, currently no bed offer from nursing home.     No intake/output data recorded. No intake/output data recorded.    Consultants:  Neurology has signed off   Procedures: None   Antimicrobials: None    Subjective: Patient still significant aphasic, able to speak a few words.  Dysphagia seem to be improving, no evidence of aspiration.  Daughter was able to feed the patient with 3 cans of Ensures in addition to soft diet. No chest pain palpitation pain No short of breath or cough. No diarrhea constipation.  Not abdominal pain or nausea  Objective: Vitals:   10/19/20 1544 10/19/20 2000 10/20/20 0400 10/20/20 0859  BP: 137/78 (!) 141/75 130/73 134/74  Pulse: 84 81 74 72  Resp: 18 16 16 16   Temp: 98.6 F (37 C) 98.9 F (37.2 C) 98.9 F (37.2 C) 98.2 F (36.8 C)  TempSrc: Oral Oral Axillary   SpO2: 98%   99%  Weight:  Height:       No intake or output data in the 24 hours ending 10/20/20 0959 Filed Weights   10/06/20 2045 10/13/20 0624 10/15/20 2215  Weight: 120.4 kg 121.3 kg 118.3 kg    Examination:  General exam: Appears calm and comfortable  Respiratory system: Clear to auscultation. Respiratory effort normal. Cardiovascular system: S1 & S2 heard, RRR. No JVD, murmurs, rubs, gallops or clicks. No pedal edema. Gastrointestinal system: Abdomen is nondistended, soft and nontender. No organomegaly or masses felt. Normal bowel sounds heard. Central nervous system: Alert and aphasic. Extremities: Symmetric 5 x 5 power. Skin: No rashes, lesions or ulcers Psychiatry:   Mood & affect appropriate.     Data Reviewed: I have personally reviewed following labs and imaging studies  CBC: Recent Labs  Lab 10/17/20 0727  WBC 5.2  NEUTROABS 2.9  HGB 12.6*  HCT 37.7*  MCV 89.3  PLT 266   Basic Metabolic Panel: Recent Labs  Lab 10/14/20 0533 10/17/20 0727 10/18/20 0828 10/19/20 0430 10/20/20 0714  NA 135 136 136 134* 133*  K 3.8 3.5 3.6 3.7 3.7  CL 103 105 105 103 103  CO2 25 26 24 24 22   GLUCOSE 184* 169* 121* 173* 122*  BUN 11 10 9 9 9   CREATININE 1.05 0.96 1.00 0.98 1.03  CALCIUM 8.8* 8.5* 8.8* 9.0 8.8*  MG  --  1.6* 1.9 1.7 1.7  PHOS  --  2.3* 3.0 2.6 2.6   GFR: Estimated Creatinine Clearance: 95 mL/min (by C-G formula based on SCr of 1.03 mg/dL). Liver Function Tests: No results for input(s): AST, ALT, ALKPHOS, BILITOT, PROT, ALBUMIN in the last 168 hours. No results for input(s): LIPASE, AMYLASE in the last 168 hours. No results for input(s): AMMONIA in the last 168 hours. Coagulation Profile: No results for input(s): INR, PROTIME in the last 168 hours. Cardiac Enzymes: No results for input(s): CKTOTAL, CKMB, CKMBINDEX, TROPONINI in the last 168 hours. BNP (last 3 results) No results for input(s): PROBNP in the last 8760 hours. HbA1C: No results for input(s): HGBA1C in the last 72 hours. CBG: Recent Labs  Lab 10/19/20 1925 10/19/20 2312 10/20/20 0059 10/20/20 0357 10/20/20 0859  GLUCAP 140* 150* 139* 148* 117*   Lipid Profile: No results for input(s): CHOL, HDL, LDLCALC, TRIG, CHOLHDL, LDLDIRECT in the last 72 hours. Thyroid Function Tests: No results for input(s): TSH, T4TOTAL, FREET4, T3FREE, THYROIDAB in the last 72 hours. Anemia Panel: No results for input(s): VITAMINB12, FOLATE, FERRITIN, TIBC, IRON, RETICCTPCT in the last 72 hours. Sepsis Labs: No results for input(s): PROCALCITON, LATICACIDVEN in the last 168 hours.  Recent Results (from the past 240 hour(s))  MRSA Next Gen by PCR, Nasal     Status: Abnormal    Collection Time: 10/12/20  8:28 AM   Specimen: Nasal Mucosa; Nasal Swab  Result Value Ref Range Status   MRSA by PCR Next Gen NEGATIVE (A) NOT DETECTED Final    Comment: Performed at [facility], [address] [place], [address] [place]         Radiology Studies: No results found.      Scheduled Meds:   stroke: mapping our early stages of recovery book   Does not apply Once   aspirin  81 mg Oral Daily   atorvastatin  80 mg Oral Daily   Chlorhexidine Gluconate Cloth  6 each Topical Daily   clopidogrel  75 mg Oral Daily   enoxaparin (LOVENOX) injection  0.5 mg/kg Subcutaneous Q24H  feeding supplement (NEPRO CARB STEADY)  237 mL Oral TID WC   insulin aspart  0-15 Units Subcutaneous Q4H   insulin glargine-yfgn  10 Units Subcutaneous QHS   megestrol  400 mg Oral Daily   Continuous Infusions:  dextrose 5 % and 0.45% NaCl 50 mL/hr at 10/18/20 1947     LOS: 13 days    Time spent: 28 minutes    Marrion Coy, MD [facility]   To contact the attending provider between 7A-7P or the covering provider during after hours 7P-7A, please log into the web site [email or web address] and access using universal [place] password for that web site. If you do not have the password, please call the hospital operator.  10/20/2020, 9:59 AM

## 2020-10-20 NOTE — Progress Notes (Signed)
Calorie Count Note  48 hour calorie count ordered.  Diet: Dysphagia 3/thins Supplements: Nepro shakes TID, Magic Cup TID  Over 48 hrs: Breakfast: N/A for either day Lunch (8/27 only): 257 kcal, 6 grams protein Dinner (8/28 only): 317 kcal, 11 grams protein Supplements: 775 kcal, 32 grams protein  All meal tickets present, however only 2 of 6 had meal intake percentages, therefore, RD could not tell if patient had eaten any of the items on the other 4 tickets.  Total intake: 675 kcal (29% of minimum estimated needs)  25 protein (21% of minimum estimated needs)  Nutrition Dx: Inadequate oral intake related to  (decreased functional status s/p CVA) as evidenced by meal completion < 50% (and pt requiring full assistance with feeding). - ongoing  Goal: Patient will meet greater than or equal to 90% of their needs. - not meeting PO  Intervention:  Continue calorie count for an additional 2 days for more accurate documentation. Continue Nepro shakes TID. Continue Magic Cup TID.  Vertell Limber, RD, LDN (she/her/hers) Registered Dietitian I After-Hours/Weekend Pager # in [place]

## 2020-10-21 LAB — GLUCOSE, CAPILLARY
Glucose-Capillary: 125 mg/dL — ABNORMAL HIGH (ref 70–99)
Glucose-Capillary: 146 mg/dL — ABNORMAL HIGH (ref 70–99)
Glucose-Capillary: 154 mg/dL — ABNORMAL HIGH (ref 70–99)
Glucose-Capillary: 158 mg/dL — ABNORMAL HIGH (ref 70–99)
Glucose-Capillary: 170 mg/dL — ABNORMAL HIGH (ref 70–99)
Glucose-Capillary: 179 mg/dL — ABNORMAL HIGH (ref 70–99)
Glucose-Capillary: 197 mg/dL — ABNORMAL HIGH (ref 70–99)

## 2020-10-21 LAB — CBC
HCT: 39.9 % (ref 39.0–52.0)
Hemoglobin: 13.3 g/dL (ref 13.0–17.0)
MCH: 29.6 pg (ref 26.0–34.0)
MCHC: 33.3 g/dL (ref 30.0–36.0)
MCV: 88.7 fL (ref 80.0–100.0)
Platelets: 296 10*3/uL (ref 150–400)
RBC: 4.5 MIL/uL (ref 4.22–5.81)
RDW: 14.1 % (ref 11.5–15.5)
WBC: 5.3 10*3/uL (ref 4.0–10.5)
nRBC: 0 % (ref 0.0–0.2)

## 2020-10-21 NOTE — TOC Progression Note (Signed)
Transition of Care [facility]) - Progression Note    Patient Details  Name: Raymond Kerr MRN: [id number] Date of Birth: February 17, 1956  Transition of Care [facility]) CM/SW Contact  Allayne Butcher, RN Phone Number: 10/21/2020, 2:46 PM  Clinical Narrative:    [facility] team working to find out if patient has insurance coverage.  Verified from patient's employment that he does not have coverage through them only dental.  The [employer] will help patient apply for Medicare part B and reports that patient automatically has Medicare part A that started the month he turned 64.  The [employer] representative will try to verify the part A and work on getting Part B.   Patient qualifies for CIR and they will accept they just have to be able to verify his Medicare part A and they have been unable to do that so far without his Medicare number.    Expected Discharge Plan: IP Rehab Facility Barriers to Discharge: Continued Medical Work up  Expected Discharge Plan and Services Expected Discharge Plan: IP Rehab Facility   Discharge Planning Services: CM Consult Post Acute Care Choice: IP Rehab Living arrangements for the past 2 months: No permanent address Youth worker)                 DME Arranged: N/A DME Agency: NA       HH Arranged: NA HH Agency: NA         Social Determinants of Health (SDOH) Interventions    Readmission Risk Interventions No flowsheet data found.

## 2020-10-21 NOTE — Progress Notes (Signed)
Calorie Count Note  48 hour calorie count ordered.  Diet: Dysphagia 3/thins Supplements: Nepro shakes TID, Magic Cup TID  Limited meal tickets available to assess today. Unable to accurately determine if pt is meeting nutrition needs. Spoke to pt's daughter [name] on 8/26. We talked about PEG and why it was recommended. Was not interested in placement at that time and it appears that pt is making progress with all areas of therapy. CIR willing to take pt upon receiving insurance authorization.   8/29 Lunch: 413 kcal and 15g of protein from meal, 2 "Go Go Squeeze" pouches, 140kcal, 0g of protein Total for lunch: 553 kcal, 15g of protein  8/30 Breakfast: 1/2 of a chickfila biscuit and a gogo squeeze pouch: 300kcal, 10g of protein  Supplements: 1.5 nepro supplements (638 kcal and 29g of protein)  Estimated Nutritional Needs:  Kcal:  2300-2500 kcal/d Protein:  115-130g/d Fluid:  2.2-2.5 L/d  Total intake: 1491 kcal (65% of minimum estimated needs)  54 protein (47% of minimum estimated needs)  Based on current information, pt is not meeting nutrition needs, but documentation is missing so unable to confidently say how much is being consumed orally.   NUTRITION DIAGNOSIS:  Inadequate oral intake related to  (decreased functional status s/p CVA) as evidenced by meal completion < 50% (and pt requiring full assistance with feeding).   GOAL:  Patient will meet greater than or equal to 90% of their needs  INTERVENTION:  Continue current diet as ordered per SLP recommendation Nepro Shake po TID, each supplement provides 425 kcal and 19 grams protein Magic cup TID with meals, each supplement provides 290 kcal and 9 grams of protein  Greig Castilla, RD, LDN Clinical Dietitian Pager on Amion

## 2020-10-21 NOTE — Progress Notes (Signed)
Inpatient Rehab Admissions Coordinator:   I spoke to pt's daughter, [name], over the phone to explain goals/expectations of CIR stay.  We discussed typical length of stay to be about 2-3 weeks, dependent upon progress, and goals of likely min assist level.  She reports she is able to provide 24/7 physical assist and will also have the help of 2 other sisters and her 66 y/o son.  We discussed insurance, and she is still working to find whether pt has any coverage or not.  I will continue to search for payor source and will f/u with pt/family if I find anything.    Estill Dooms, PT, DPT Admissions Coordinator [phone] 10/21/20  12:57 PM

## 2020-10-21 NOTE — Consult Note (Signed)
Physical Medicine and Rehabilitation Consult Reason for Consult: CVA Referring Physician:  Marrion Coy, MD   HPI: Raymond Kerr is a 65 y.o. male with a PMH of HTN, DM2, HLD, who was admitted to [facility] with 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 Three days after admission he developed worsening aphasia and R sided weakness and was found to have extension of both his L frontal and brainstem infarcts. Physical Medicine & Rehabilitation was consulted to assess candidacy for CIR.    Review of Systems  Constitutional: Negative.   HENT: Negative.    Eyes: Negative.   Respiratory: Negative.    Cardiovascular: Negative.   Gastrointestinal: Negative.   Genitourinary: Negative.   Musculoskeletal: Negative.   Skin: Negative.   Neurological:  Positive for speech change and weakness.  Endo/Heme/Allergies: Negative.   Psychiatric/Behavioral: Negative.    Past Medical History:  Diagnosis Date   Diabetes mellitus without complication (HCC)    GERD (gastroesophageal reflux disease)    Hypertension    Past Surgical History:  Procedure Laterality Date   TEE WITHOUT CARDIOVERSION N/A 10/08/2020   Procedure: TRANSESOPHAGEAL ECHOCARDIOGRAM (TEE);  Surgeon: Lamar Blinks, MD;  Location: [facility] ORS;  Service: Cardiovascular;  Laterality: N/A;   Family History  Problem Relation Age of Onset   Prostate cancer Neg Hx    Bladder Cancer Neg Hx    Kidney cancer Neg Hx    Social History:  reports that he has never smoked. He has never used smokeless tobacco. He reports current alcohol use. He reports that he does not use drugs. Allergies: No Known Allergies Medications Prior to Admission  Medication Sig Dispense Refill   glimepiride (AMARYL) 4 MG tablet Take 4 mg by mouth daily with breakfast.     losartan (COZAAR) 100 MG tablet Take 100 mg by mouth daily.     pioglitazone (ACTOS) 30 MG tablet Take 30 mg by mouth daily.      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 The home is a single-story home without steps to enter. Pt's friend indicates his daugther is disabled and unable to physically assist with any mobility.  Lives With: Daughter  Functional History: Prior Function Level of Independence: Independent Comments: Pt is a independent with ADLs, IADLs and intermittently unloads trunks but mainly drives. Functional Status:  Mobility: Bed Mobility Overal bed mobility: Needs Assistance Bed Mobility: Sit to Supine Rolling: Max assist Supine to sit: Max assist, HOB elevated Sit to supine: Total assist General bed mobility comments: assist to BLE, scooting, and trunk elevation Transfers Overall transfer level: Needs assistance Equipment used: 2 person hand held assist Transfers: Lateral/Scoot Transfers Sit to Stand: +2 physical assistance, +2 safety/equipment, Max assist Stand pivot transfers: Max assist, +2 physical assistance  Lateral/Scoot Transfers: Max assist, +2 physical assistance General transfer comment: Bed > recliner to L. Pt unable to stand from recliner chair secondary to fatigue. Ambulation/Gait Ambulation/Gait assistance: Max assist, +2 safety/equipment, +2 physical assistance, Total assist Gait Distance (Feet): 8 Feet Assistive device: 2 person hand held assist Gait Pattern/deviations: Step-to pattern, Trunk flexed General Gait Details: LUE to L hand rail w/ cues for UE advancement & +  1 to L side for facilitation of stepping w/ LLE; RUE w/ 1 physical assist requiring total assist for RLE swing & max-A for R knee block during stance Gait  velocity: decreased    ADL: ADL Overall ADL's : Needs assistance/impaired Grooming: Wash/dry face, Wash/dry hands, Bed level, Set up, Supervision/safety Grooming Details (indicate cue type and reason): Pt washes face with increased time to initiate and sequence and use of L UE to complete task. Lower Body Bathing: Maximal assistance, +2 for physical assistance, Sit to/from stand Lower Body Bathing Details (indicate cue type and reason): MOD A for maintaining standing balance d/t R lateral lean and MAX A for LB bathing. Upper Body Dressing : Moderate assistance, Sitting Upper Body Dressing Details (indicate cue type and reason): to don/doff hospital gown via hemi-dressing technique. Pt required MOD verbal cues for implementing technique and using R UE as functional assist to fasten x6 snap buttons Toileting- Clothing Manipulation and Hygiene: Maximal assistance, Bed level Toileting - Clothing Manipulation Details (indicate cue type and reason): for posterior peri-care Functional mobility during ADLs: Maximal assistance, +2 for physical assistance (lateral/scoot transfer with slideboard) General ADL Comments: Pt requires MOD A for LB ADL, set up for self feeding, MIN A for ADL transfers  Cognition: Cognition Overall Cognitive Status: Impaired/Different from baseline Arousal/Alertness: Awake/alert Orientation Level: Oriented to person, Oriented to place, Oriented to situation, Oriented to time (hard to communicate with patient) Attention: Selective Selective Attention: Appears intact Memory: Impaired Memory Impairment: Decreased recall of new information Awareness: Appears intact (cursory) Problem Solving:  (CNT fully) Behaviors:  (none) Safety/Judgment:  (CNT fully) Cognition Arousal/Alertness: Awake/alert Behavior During Therapy: Flat affect Overall Cognitive Status: Impaired/Different from baseline Area of Impairment: Problem solving, Following commands, Orientation, Attention,  Safety/judgement, Awareness, Memory Orientation Level: Person Current Attention Level: Focused Memory: Decreased recall of precautions, Decreased short-term memory Following Commands: Follows one step commands inconsistently, Follows one step commands with increased time Safety/Judgement: Decreased awareness of safety, Decreased awareness of deficits Awareness: Intellectual Problem Solving: Slow processing, Decreased initiation, Difficulty sequencing, Requires verbal cues, Requires tactile cues General Comments: Follows simple commands intermittently  Blood pressure (!) 146/77, pulse 79, temperature 98.6 F (37 C), temperature source Oral, resp. rate 16, height 6' (1.829 m), weight 118.3 kg, SpO2 100 %. Physical Exam Gen: no distress, normal appearing HEENT: oral mucosa pink and moist, NCAT, left eye lid completely closed Cardio: Reg rate Chest: normal effort, normal rate of breathing Abd: soft, non-distended Ext: no edema Psych: Flat affect Skin: intact Neuro: Alert and oriented. Slow processing. Decreased initiation.  Musculoskeletal: Right upper and lower extremity 0/5 throughout, LUE with limited mobility as well- testing limited by patient's aphasia.   Results for orders placed or performed during the hospital encounter of 10/06/20 (from the past 24 hour(s))  Glucose, capillary     Status: Abnormal   Collection Time: 10/20/20  3:55 PM  Result Value Ref Range   Glucose-Capillary 192 (H) 70 - 99 mg/dL  Glucose, capillary     Status: Abnormal   Collection Time: 10/20/20 10:48 PM  Result Value Ref Range   Glucose-Capillary 203 (H) 70 - 99 mg/dL  Glucose, capillary     Status: Abnormal   Collection Time: 10/21/20 12:22 AM  Result Value Ref Range   Glucose-Capillary 179 (H) 70 - 99 mg/dL  Glucose, capillary     Status: Abnormal   Collection Time: 10/21/20  3:56 AM  Result Value Ref Range   Glucose-Capillary 125 (H) 70 - 99 mg/dL  CBC     Status: None   Collection Time:  10/21/20  4:50 AM  Result Value Ref Range   WBC 5.3 4.0 - 10.5 K/uL   RBC 4.50 4.22 - 5.81 MIL/uL   Hemoglobin 13.3 13.0 - 17.0 g/dL   HCT 63.8 45.3 - 64.6 %   MCV 88.7 80.0 - 100.0 fL   MCH 29.6 26.0 - 34.0 pg   MCHC 33.3 30.0 - 36.0 g/dL   RDW 80.3 21.2 - 24.8 %   Platelets 296 150 - 400 K/uL   nRBC 0.0 0.0 - 0.2 %  Glucose, capillary     Status: Abnormal   Collection Time: 10/21/20  7:59 AM  Result Value Ref Range   Glucose-Capillary 170 (H) 70 - 99 mg/dL  Glucose, capillary     Status: Abnormal   Collection Time: 10/21/20 11:54 AM  Result Value Ref Range   Glucose-Capillary 146 (H) 70 - 99 mg/dL   No results found.   Assessment/Plan: Diagnosis: L thalamic and L frontal ischemic strokes Does the need for close, 24 hr/day medical supervision in concert with the patient's rehab needs make it unreasonable for this patient to be served in a less intensive setting? Yes Co-Morbidities requiring supervision/potential complications:  obesity (BMI 35.37): provide dietary counseling paralysis of left 3rd cranial nerve right sided weakness Pressure injury of skin- offload q2H Decreased initiation: consider trial of Amantadine 100mg  daily Due to bladder management, bowel management, safety, skin/wound care, disease management, medication administration, pain management, and patient education, does the patient require 24 hr/day rehab nursing? Yes Does the patient require coordinated care of a physician, rehab nurse, therapy disciplines of PT, OT, SLP to address physical and functional deficits in the context of the above medical diagnosis(es)? Yes Addressing deficits in the following areas: balance, endurance, locomotion, strength, transferring, bowel/bladder control, bathing, dressing, feeding, grooming, toileting, cognition, language, and psychosocial support Can the patient actively participate in an intensive therapy program of at least 3 hrs of therapy per day at least 5 days per week?  Yes The potential for patient to make measurable gains while on inpatient rehab is excellent Anticipated functional outcomes upon discharge from inpatient rehab are min assist  with PT, min assist with OT, min assist with SLP. Estimated rehab length of stay to reach the above functional goals is: 10-14 days Anticipated discharge destination: Home Overall Rehab/Functional Prognosis: excellent  RECOMMENDATIONS: This patient's condition is appropriate for continued rehabilitative care in the following setting: CIR Patient has agreed to participate in recommended program. Yes Note that insurance prior authorization may be required for reimbursement for recommended care.  Comment: Thank you for this consult. Admission coordinator to follow.   I have personally performed a face to face diagnostic evaluation, including, but not limited to relevant history and physical exam findings, of this patient and developed relevant assessment and plan.     , MD 10/21/2020

## 2020-10-21 NOTE — Progress Notes (Signed)
Physical Therapy Treatment Patient Details Name: Raymond Kerr MRN: [id number] DOB: [birth year] Today's Date: 10/21/2020    History of Present Illness Pt is a 65 yo M w/ PMH of HTN, DM2, HL who presented with multiple neurologic sx and was found to have 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 3 days after admission he developed worsening aphasia and R sided weakness, transferred to ICU and underwent repeat imaging. Repeat MRI brain revealed extension of both his L frontal and brainstem infarcts as well as new watershed infarct in the internal border zone on the L. (per chart).    PT Comments    Pt alert in bed with daughter Raymond Kerr) in room assisting with lunch. Family and pt agreeable to treatment. Pt requires max-A for bed mobility; transfers require +2 physical assist, Max-A. Min-A for sitting balance intermittently due to posterior lean, overall fair sitting balance that does not require assistance more than occasional assist. Pt able to take small step w/ L hand rail, +2 physical assist, TotalA for RUE/RLE support in stance and swing. Multi-modal cues are required for weight shifting for scooting, hand placement during transfers, and stepping response during ambulation. Pt continues to demonstrate motivation throughout treatment. Skilled PT intervention is indicated to address deficits in function, mobility, and to return to PLOF as able.  Discharge recommendations remain CIR.   Follow Up Recommendations  CIR     Equipment Recommendations  None recommended by PT    Recommendations for Other Services       Precautions / Restrictions Precautions Precautions: Fall Precaution Comments: R hemi Restrictions Weight Bearing Restrictions: No    Mobility  Bed Mobility Overal bed mobility: Needs Assistance Bed Mobility: Sit to Supine     Supine to sit: Max assist;HOB elevated          Transfers Overall transfer level: Needs  assistance Equipment used: 2 person hand held assist Transfers: Sit to/from [employer] Sit to Stand: +2 physical assistance;+2 safety/equipment;Max assist   Squat pivot transfers: Max assist;+2 physical assistance        Ambulation/Gait Ambulation/Gait assistance: +2 safety/equipment;+2 physical assistance;Total assist Gait Distance (Feet): 0.5 Feet Assistive device: 2 person hand held assist Gait Pattern/deviations: Step-to pattern;Trunk flexed Gait velocity: decreased   General Gait Details: Total-A+ 2 physical assist for RLE, RUE swing and stance w/ L hand rail   Stairs             Wheelchair Mobility    Modified Rankin (Stroke Patients Only)       Balance Overall balance assessment: Needs assistance Sitting-balance support: Feet supported;Single extremity supported Sitting balance-Leahy Scale: Fair Sitting balance - Comments: occassional min-A for static sitting due to posterior lean Postural control: Posterior lean Standing balance support: During functional activity;Bilateral upper extremity supported Standing balance-Leahy Scale: Zero Standing balance comment: Requires max-A + 2 for static stance                            Cognition Arousal/Alertness: Awake/alert Behavior During Therapy: Flat affect Overall Cognitive Status: Impaired/Different from baseline Area of Impairment: Problem solving;Following commands;Orientation;Attention;Safety/judgement;Awareness;Memory                   Current Attention Level: Focused Memory: Decreased recall of precautions;Decreased short-term memory Following Commands: Follows one step commands inconsistently;Follows one step commands with increased time Safety/Judgement: Decreased awareness of safety;Decreased awareness of deficits Awareness: Intellectual Problem Solving: Slow processing;Decreased initiation;Difficulty sequencing;Requires verbal cues;Requires  tactile cues         Exercises      General Comments        Pertinent Vitals/Pain Pain Assessment: Faces Faces Pain Scale: Hurts little more Pain Location: R knee cap Pain Descriptors / Indicators: Grimacing Pain Intervention(s): Limited activity within patient's tolerance;Monitored during session;Repositioned    Home Living                      Prior Function            PT Goals (current goals can now be found in the care plan section) Acute Rehab PT Goals Patient Stated Goal: rehab PT Goal Formulation: With patient/family Time For Goal Achievement: 10/28/20 Potential to Achieve Goals: Fair Progress towards PT goals: Progressing toward goals    Frequency    7X/week      PT Plan Current plan remains appropriate    Co-evaluation              AM-PAC PT "6 Clicks" Mobility   Outcome Measure  Help needed turning from your back to your side while in a flat bed without using bedrails?: A Lot Help needed moving from lying on your back to sitting on the side of a flat bed without using bedrails?: A Lot Help needed moving to and from a bed to a chair (including a wheelchair)?: A Lot Help needed standing up from a chair using your arms (e.g., wheelchair or bedside chair)?: A Lot Help needed to walk in hospital room?: Total Help needed climbing 3-5 steps with a railing? : Total 6 Click Score: 10    End of Session Equipment Utilized During Treatment: Gait belt Activity Tolerance: Patient tolerated treatment well;Patient limited by fatigue Patient left: in chair;with call bell/phone within reach;with chair alarm set Nurse Communication: Mobility status PT Visit Diagnosis: Other abnormalities of gait and mobility (R26.89);Muscle weakness (generalized) (M62.81);Other symptoms and signs involving the nervous system (R29.898);Hemiplegia and hemiparesis;Difficulty in walking, not elsewhere classified (R26.2) Hemiplegia - Right/Left: Right Hemiplegia - caused by: Cerebral  infarction     Time: 2119-4174 PT Time Calculation (min) (ACUTE ONLY): 43 min  Charges:                        [employer], SPT

## 2020-10-21 NOTE — Progress Notes (Signed)
PROGRESS NOTE    Raymond Kerr  WPY:[id number] DOB: 1955-07-08 DOA: 10/06/2020 PCP: Barbette Reichmann, MD   Follow-up on stroke Brief Narrative:  Raymond Kerr is a 65 y.o. African-American male with medical history significant for type II diabetes mellitus, GERD and hypertension, who presented to the emergency room with acute onset of diplopia and generalized weakness that started 8 hours prior to presentation.  The patient has been having expressive dysphasia. On arrival to the emergency room here it appears that the patient has left-sided weakness and difficulty with speech.  MRI of the brain showed small acute infarcts of the left paramedian frontal lobe and ventromedial left thalamus. Neurology consult has been obtained. MRA head showed a short segment of the left ACA A2 segment. TEE did not show any thrombus.  Patient had an episode of hypotension, midodrine was started. Repeated CT scan was obtained on 8/19 due to acute unresponsiveness.  Did not show any acute changes. Patient has also been seen by speech therapy, still significant dysphagia.  Recommend PEG tube placement.  Put on dysphagia diet since 8/23.  Family refused feeding tube placement.  But the patient was able to eat with help from family.   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 Transient hypotension Dysphagia secondary to stroke. Patient still has significant aphasia, dysphagia.  But was able to eat when fed by daughter.  Nutrition is following patient, doing a calorie count. Family had refused a PEG tube.  I am giving patient is 3 cans of Ensure a day,  looks like he is drinking it.  Additionally he was able to eat a soft food.  Type 2 diabetes. Continue current regimen.   DVT prophylaxis:  Code Status:  Family Communication:  Disposition Plan:    Status is: Inpatient  Remains inpatient appropriate because:Unsafe d/c plan  Dispo: The patient is from: Home              Anticipated d/c is to: SNF              Patient currently is medically stable to d/c.   Difficult to place patient Yes        I/O last 3 completed shifts: In: 0  Out: 750 [Urine:750] No intake/output data recorded.     Consultants:  None  Procedures: None  Antimicrobials: None  Subjective: Patient still has significant aphasia and dysphagia.  But able to drink Ensure and eat soft food. No short of breath or cough. No dysuria hematuria pain No fever or chills.  Objective: Vitals:   10/20/20 2218 10/21/20 0359 10/21/20 0759 10/21/20 1135  BP: 124/71 127/75 139/76 (!) 146/77  Pulse: 75 81 79 79  Resp: 18 18 16    Temp: 99.5 F (37.5 C) 98.9 F (37.2 C) 98.1 F (36.7 C) 98.6 F (37 C)  TempSrc:    Oral  SpO2: 98% 99% 97% 100%  Weight:      Height:        Intake/Output Summary (Last 24 hours) at 10/21/2020 1538 Last data filed at 10/21/2020 0100 Gross per 24 hour  Intake 0  ml  Output 200 ml  Net -200 ml   Filed Weights   10/06/20 2045 10/13/20 0624 10/15/20 2215  Weight: 120.4 kg 121.3 kg 118.3 kg    Examination:  General exam: Appears calm and comfortable  Respiratory system: Clear to auscultation. Respiratory effort normal. Cardiovascular system: S1 & S2 heard, RRR. No JVD, murmurs, rubs, gallops or clicks. No pedal edema. Gastrointestinal system: Abdomen is nondistended, soft and nontender. No organomegaly or masses felt. Normal bowel sounds heard. Central nervous system: Alert, aphasic, left facial droop. Extremities: Symmetric 5 x 5 power. Skin: No rashes, lesions or ulcers Psychiatry: Judgement and insight appear normal.  Mood & affect appropriate.     Data Reviewed: I have personally reviewed following labs and imaging studies  CBC: Recent Labs  Lab 10/17/20 0727 10/21/20 0450  WBC 5.2 5.3  NEUTROABS 2.9  --   HGB 12.6* 13.3  HCT 37.7* 39.9  MCV 89.3 88.7  PLT 266 296   Basic Metabolic Panel: Recent Labs  Lab 10/17/20 0727 10/18/20 0828 10/19/20 0430 10/20/20 0714  NA 136 136 134* 133*  K 3.5 3.6 3.7 3.7  CL 105 105 103 103  CO2 26 24 24 22   GLUCOSE 169* 121* 173* 122*  BUN 10 9 9 9   CREATININE 0.96 1.00 0.98 1.03  CALCIUM 8.5* 8.8* 9.0 8.8*  MG 1.6* 1.9 1.7 1.7  PHOS 2.3* 3.0 2.6 2.6   GFR: Estimated Creatinine Clearance: 95 mL/min (by C-G formula based on SCr of 1.03 mg/dL). Liver Function Tests: No results for input(s): AST, ALT, ALKPHOS, BILITOT, PROT, ALBUMIN in the last 168 hours. No results for input(s): LIPASE, AMYLASE in the last 168 hours. No results for input(s): AMMONIA in the last 168 hours. Coagulation Profile: No results for input(s): INR, PROTIME in the last 168 hours. Cardiac Enzymes: No results for input(s): CKTOTAL, CKMB, CKMBINDEX, TROPONINI in the last 168 hours. BNP (last 3 results) No results for input(s): PROBNP in the last 8760 hours. HbA1C: No results for input(s): HGBA1C in the last 72 hours. CBG: Recent Labs  Lab 10/20/20 2248 10/21/20 0022 10/21/20 0356 10/21/20 0759 10/21/20 1154  GLUCAP 203* 179* 125* 170* 146*   Lipid Profile: No results for input(s): CHOL, HDL, LDLCALC, TRIG, CHOLHDL, LDLDIRECT in the last 72 hours. Thyroid Function Tests: No results for input(s): TSH, T4TOTAL, FREET4, T3FREE, THYROIDAB in the last 72 hours. Anemia Panel: No results for input(s): VITAMINB12, FOLATE, FERRITIN, TIBC, IRON, RETICCTPCT in the last 72 hours. Sepsis Labs: No results for input(s): PROCALCITON, LATICACIDVEN in the last 168 hours.  Recent Results (from the past 240 hour(s))  MRSA Next Gen by PCR, Nasal     Status: Abnormal   Collection  Time: 10/12/20  8:28 AM   Specimen: Nasal Mucosa; Nasal Swab  Result Value Ref Range Status   MRSA by PCR Next Gen NEGATIVE (A) NOT DETECTED Final    Comment: Performed at [facility], [address], [place], [address] [place]         Radiology Studies: No results found.      Scheduled Meds:   stroke: mapping our early stages of recovery book   Does not apply Once   aspirin  81 mg Oral Daily   atorvastatin  80 mg Oral Daily   Chlorhexidine Gluconate Cloth  6 each Topical Daily   clopidogrel  75 mg Oral Daily   enoxaparin (LOVENOX) injection  0.5 mg/kg Subcutaneous Q24H   feeding supplement (NEPRO CARB STEADY)  237 mL  Oral TID WC   insulin aspart  0-15 Units Subcutaneous Q4H   insulin glargine-yfgn  10 Units Subcutaneous QHS   megestrol  400 mg Oral Daily   Continuous Infusions:  dextrose 5 % and 0.45% NaCl 50 mL/hr at 10/21/20 1138     LOS: 14 days    Time spent: 28 minutes    Marrion Coy, MD [facility]   To contact the attending provider between 7A-7P or the covering provider during after hours 7P-7A, please log into the web site [email or web address] and access using universal [place] password for that web site. If you do not have the password, please call the hospital operator.  10/21/2020, 3:38 PM

## 2020-10-22 LAB — GLUCOSE, CAPILLARY
Glucose-Capillary: 158 mg/dL — ABNORMAL HIGH (ref 70–99)
Glucose-Capillary: 125 mg/dL — ABNORMAL HIGH (ref 70–99)
Glucose-Capillary: 120 mg/dL — ABNORMAL HIGH (ref 70–99)
Glucose-Capillary: 175 mg/dL — ABNORMAL HIGH (ref 70–99)
Glucose-Capillary: 147 mg/dL — ABNORMAL HIGH (ref 70–99)

## 2020-10-22 NOTE — Progress Notes (Addendum)
Physical Therapy Treatment Patient Details Name: Raymond Kerr MRN: [id number] DOB: 23-Mar-1955 Today's Date: 10/22/2020    History of Present Illness Pt is a 65 yo M w/ PMH of HTN, DM2, HL who presented with multiple neurologic sx and was found to have 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 3 days after admission he developed worsening aphasia and R sided weakness, transferred to ICU and underwent repeat imaging. Repeat MRI brain revealed extension of both his L frontal and brainstem infarcts as well as new watershed infarct in the internal border zone on the L. (per chart).    PT Comments    Pt lethargic beginning of treatment session requiring multimodal cues to awake. Pt is cooperative and shows signs of motivation but reaching w/ LUE to scoot forward and backward w/o cues. Pt requires max-A for bed mobility but is able to reach w/ LUE with cues. 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 No LOB noted, but consistent cues required to maintain midline in seated position. Pt performed sit <> stand x 3 w/ +2 for physical assist. Pt able to achieve full upright posture during stance with tactile cues to facilitate hip and trunk extension. Skilled PT intervention is indicated to address deficits in function, mobility, and to return to PLOF as able.  Discharge recommendations are CIR.    Follow Up Recommendations  CIR     Equipment Recommendations  Other (comment) (TBD next venue of care)    Recommendations for Other Services       Precautions / Restrictions Precautions Precautions: Fall Precaution Comments: R hemi Restrictions Weight Bearing Restrictions: No    Mobility  Bed Mobility Overal bed mobility: Needs Assistance Bed Mobility: Supine to Sit     Supine to sit: Max assist;HOB elevated          Transfers Overall transfer level: Needs assistance Equipment used: 2  person hand held assist Transfers: Sit to/from [employer] Transfers Sit to Stand: +2 physical assistance;+2 safety/equipment;Max assist Stand pivot transfers: Max assist;+2 physical assistance          Ambulation/Gait                 Stairs             Wheelchair Mobility    Modified Rankin (Stroke Patients Only)       Balance Overall balance assessment: Needs assistance Sitting-balance support: Feet supported;Single extremity supported Sitting balance-Leahy Scale: Fair   Postural control: Right lateral lean   Standing balance-Leahy Scale: Zero Standing balance comment: Requires max-A + 2 for static stance                            Cognition Arousal/Alertness: Lethargic Behavior During Therapy: Flat affect Overall Cognitive Status: Impaired/Different from baseline Area of Impairment: Problem solving;Following commands;Orientation;Attention;Safety/judgement;Awareness;Memory                 Orientation Level: Person Current Attention Level: Focused Memory: Decreased recall of precautions;Decreased short-term memory Following Commands: Follows one step commands inconsistently;Follows one step commands with increased time Safety/Judgement: Decreased awareness of safety;Decreased awareness of deficits Awareness: Intellectual Problem Solving: Slow processing;Decreased initiation;Difficulty sequencing;Requires verbal cues;Requires tactile cues        Exercises Other Exercises Other Exercises: sit <> stand x 3 + 2 physical assist w/ knee block to R LE, approximating RUE Other Exercises: Seated weight shifting to  L w/ L arm horizontal abd reach w/ min-gaurd, cues for technique    General Comments        Pertinent Vitals/Pain Pain Assessment: Faces Faces Pain Scale: Hurts little more Pain Location: R knee cap Pain Descriptors / Indicators: Grimacing Pain Intervention(s): Limited activity within patient's tolerance;Monitored  during session;Repositioned    Home Living                      Prior Function            PT Goals (current goals can now be found in the care plan section) Acute Rehab PT Goals Patient Stated Goal: rehab PT Goal Formulation: With patient/family Time For Goal Achievement: 10/28/20 Potential to Achieve Goals: Fair Progress towards PT goals: Progressing toward goals    Frequency    7X/week      PT Plan Current plan remains appropriate    Co-evaluation              AM-PAC PT "6 Clicks" Mobility   Outcome Measure  Help needed turning from your back to your side while in a flat bed without using bedrails?: A Lot Help needed moving from lying on your back to sitting on the side of a flat bed without using bedrails?: A Lot Help needed moving to and from a bed to a chair (including a wheelchair)?: A Lot Help needed standing up from a chair using your arms (e.g., wheelchair or bedside chair)?: A Lot Help needed to walk in hospital room?: Total Help needed climbing 3-5 steps with a railing? : Total 6 Click Score: 10    End of Session Equipment Utilized During Treatment: Gait belt Activity Tolerance: Patient tolerated treatment well;Patient limited by fatigue Patient left: in chair;with call bell/phone within reach;with chair alarm set Nurse Communication: Mobility status PT Visit Diagnosis: Other abnormalities of gait and mobility (R26.89);Muscle weakness (generalized) (M62.81);Other symptoms and signs involving the nervous system (R29.898);Hemiplegia and hemiparesis;Difficulty in walking, not elsewhere classified (R26.2) Hemiplegia - Right/Left: Right Hemiplegia - caused by: Cerebral infarction     Time: 9604-5409 PT Time Calculation (min) (ACUTE ONLY): 53 min  Charges:                        [employer], SPT

## 2020-10-22 NOTE — Progress Notes (Signed)
Calorie Count Note  48 hour calorie count ordered.  Diet: Dysphagia 3/thins Supplements: Nepro shakes TID, Magic Cup TID  Limited meal tickets available to assess. Two tickets placed in envelope, one with no intake recorded and the other with outside food with no quantities or brand names written. Unable to accurately determine if pt is meeting nutrition needs. Calorie count has been attempted unsuccessfully since 8/27. Will discontinue due to lack of documentation. Pt has been drinking Nepro supplements.  Spoke to pt's daughters on 8/26. We talked about PEG and why it was recommended. Family was not interested in placement at that time. If further discussions about PEG is desired, would defer to attending. Will follow-up with pt as planned for nutrition assessments.   Greig Castilla, RD, LDN Clinical Dietitian Pager on Amion

## 2020-10-22 NOTE — Progress Notes (Signed)
Speech Language Pathology Treatment: Dysphagia;Cognitive-Linquistic  Patient Details Name: Raymond Kerr MRN: [id number] DOB: [birth year] Today's Date: 10/22/2020 Time: 1430-1500 SLP Time Calculation (min) (ACUTE ONLY): 30 min  Assessment / Plan / Recommendation Clinical Impression  Pt seen for ongoing assessment of swallowing; toleration of diet. 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 Pt is on RA; wbc wnl. Sitting in chair this tx session.  Pt explained general aspiration precautions and gave slight head nod to agreement for the need for following them: SMALL sips, sitting Upright, drinking Slowly, Clearing mouth b/t bites. Pt assisted w/ positioning supporting arm for self-feeding. He fed himself sips of thin liquids via Cup consuming ~5-6 ozs total. No clinical s/s of aspiration were noted initially w/ trials until a larger sip at end of trials resulting in Coughing and multiple, audible swallows. Respiratory status returned to baseline quickly w/ no labored breathing. Oral and pharyngeal phases appeared mildly deliberate (during bolus management and A-P transfer for swallowing). Bolus Piecemealling noted. Labial leakage noted on R side d/t decreased labial tone and control of bolus during the oral phase.    During Language tx, practiced Automatic speech tasks; basic Y/N questions w/ strong head nod/shake, verbalizations. Pt approximated words during Automatics, but this was inconsistency and c/b paraphasias. Accuracy of basic Y/Ns was <25%; Y/N verbal response was <10%. Verbalizations during casual speech greetings: 2/5. Attempted using the Thumbs Up gesture for communication of "OK". Pt began to show his fingers as if he was "counting" but this was not in context w/ Automatic Counting tasks; also possibly Perseveration.    Pt continues to be a Min+ increased risk for  aspiration w/ oral intake d/t oropharyngeal phase swallowing deficits; Aphasia and Apraxia. When following general aspiration precautions, this risk is reduced. Recommend continue a mech soft diet for the encouragement of eating/drinking; gravies added to moisten foods; Thin liquids VIA CUP ONLY -- Single Small Sips SLOWLY. Pt may benefit from a Dysphagia Drink Cup -- will f/u to supply pt w/ such. Recommend following aspiration precautions; Supervision during meals and to assist w/ meals;  Pills Whole in Puree; tray setup and positioning assistance for meals. ST services will continue to f/u w/ toleration of diet and need for objective swallowing assessment. NSG updated.  Precautions posted at bedside for both aspiration precautions and tasks to engage verbal communication from pt during family visits; communication strategies.         HPI HPI: Pt  is a 65 y.o. African-American male with medical history significant for type II diabetes mellitus, GERD and hypertension, who presented to the emergency room with acute onset of diplopia and generalized weakness that started 8 hours prior to presentation.  The patient has been having expressive dysphasia.  His fiance who accompanied him stated that his symptoms started around midnight on 8/13 with dysarthria and diplopia but he was more confused.  He was noted in the ER to have left facial droop and was unable to lift his left heel.  They went to [place] on Friday and he started having symptoms on Saturday night with double vision.  They went to bed at 12 midnight and at 6 AM she noted that he knocked food on the floor and was later starting to have slurred speech.  He was feeling generally weak and unable to ambulate however without unilateral focal muscle weakness or paresthesias.  No chest pain or dyspnea or  palpitations.  No cough or wheezing.  No urinary or stool incontinence.  CT Angio of head/neck: "No acute intracranial hemorrhage. Evolving areas of  recent  infarction are identified in the left thalamus and left ACA  territory. No definite acute infarct.     No new large vessel occlusion. 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 Perfusion imaging demonstrates 4 mL of penumbra within the distal  left ACA territory.".  MRI: Small acute infarcts of the left paramedian frontal lobe and  ventromedial left thalamus. No hemorrhage or mass effect.  UPDATED: 10/13/2020:  Per Neuro, "3 days after admission he developed worsening aphasia and R sided weakness. Repeat MRI brain revealed extension of both his L frontal and brainstem infarcts as well as new watershed infarct in the internal border zone on the L". With this decline in status, pt was transferred to CCU over the weekend(8/20) and made NPO.      SLP Plan  Continue with current plan of care       Recommendations  Diet recommendations: Dysphagia 3 (mechanical soft);Thin liquid (meats minced w/ gravies) Liquids provided via: Cup;No straw Medication Administration: Crushed with puree (for safer swallowing) Supervision: Staff to assist with self feeding;Full supervision/cueing for compensatory strategies Compensations: Minimize environmental distractions;Slow rate;Small sips/bites;Lingual sweep for clearance of pocketing;Multiple dry swallows after each bite/sip;Follow solids with liquid (Time b/t trials) Postural Changes and/or Swallow Maneuvers: Out of bed for meals;Seated upright 90 degrees;Upright 30-60 min after meal                General recommendations:  (PT/OT) Oral Care Recommendations: Oral care BID;Oral care before and after PO;Staff/trained caregiver to provide oral care Follow up Recommendations: Inpatient Rehab SLP Visit Diagnosis: Dysphagia, oropharyngeal phase (R13.12);Aphasia (R47.01);Apraxia (R48.2) Plan: Continue with current plan of care       GO                   Jerilynn Som, MS, CCC-SLP Speech Language Pathologist Rehab Services [phone] [facility] 10/22/2020, 5:12 PM

## 2020-10-22 NOTE — Progress Notes (Signed)
PT Cancellation Note  Patient Details Name: Raymond Kerr MRN: [id number] DOB: [birth year]   Cancelled Treatment:    Reason Eval/Treat Not Completed: Other (comment) Pt eating breakfast. PT to reassess as able.   [employer], SPT

## 2020-10-22 NOTE — Progress Notes (Signed)
Occupational Therapy Treatment Patient Details Name: Raymond Kerr MRN: [id number] DOB: [birth year] Today's Date: 10/22/2020    History of present illness Pt is a 65 yo M w/ PMH of HTN, DM2, HL who presented with multiple neurologic sx and was found to have 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 3 days after admission he developed worsening aphasia and R sided weakness, transferred to ICU and underwent repeat imaging. Repeat MRI brain revealed extension of both his L frontal and brainstem infarcts as well as new watershed infarct in the internal border zone on the L. (per chart).   OT comments  Upon entering the room, pt seated in recliner chair. Pt appears fatigued as this is his third therapy session today. Pt nodding head "yes" to return to bed. Pt needs increased time and mod multimodal cuing to locate and pick up R UE for set up of transfers. Pt needing max A of 2 for lateral scoot to the L. Focus on L lateral leaning and returning to midline several times secondary to pushing to the R. Pt needing total A to return to bed and for repositioning. Pt continues to benefit from OT intervention and continue to recommend intensive rehab to address functional deficits.    Follow Up Recommendations  CIR    Equipment Recommendations  Other (comment) (defer to next venue of care)       Precautions / Restrictions Precautions Precautions: Fall Precaution Comments: R hemi Restrictions Weight Bearing Restrictions: No       Mobility Bed Mobility Overal bed mobility: Needs Assistance Bed Mobility: Sit to Supine     Supine to sit: Max assist;HOB elevated Sit to supine: Total assist   General bed mobility comments: Pt does initiate lifting L LE into bed but still needing 90% assist to supine safely    Transfers Overall transfer level: Needs assistance Equipment used: 2 person hand held assist Transfers: Lateral/Scoot Transfers Sit to Stand: +2  physical assistance;+2 safety/equipment;Max assist Stand pivot transfers: Max assist;+2 physical assistance      Lateral/Scoot Transfers: Max assist;+2 physical assistance General transfer comment: Pt very fatigued this session.    Balance Overall balance assessment: Needs assistance Sitting-balance support: Feet supported;Single extremity supported Sitting balance-Leahy Scale: Fair Sitting balance - Comments: close supervision - min guard with cuing Postural control: Right lateral lean Standing balance support: During functional activity;Bilateral upper extremity supported Standing balance-Leahy Scale: Zero Standing balance comment: Requires max-A + 2 for static stance                           ADL either performed or assessed with clinical judgement      Cognition Arousal/Alertness: Awake/alert Behavior During Therapy: Flat affect Overall Cognitive Status: Impaired/Different from baseline Area of Impairment: Problem solving;Following commands;Orientation;Attention;Safety/judgement;Awareness;Memory                 Orientation Level: Person Current Attention Level: Focused Memory: Decreased recall of precautions;Decreased short-term memory Following Commands: Follows one step commands inconsistently;Follows one step commands with increased time Safety/Judgement: Decreased awareness of safety;Decreased awareness of deficits Awareness: Intellectual Problem Solving: Slow processing;Decreased initiation;Difficulty sequencing;Requires verbal cues;Requires tactile cues          Exercises Other Exercises Other Exercises: sit <> stand x 3 + 2 physical assist w/ knee block to R LE, approximating RUE Other Exercises: Seated weight shifting to L w/ L arm horizontal abd reach w/ min-gaurd, cues for technique  Pertinent Vitals/ Pain       Pain Assessment: Faces Pain Score: 0-No pain Faces Pain Scale: Hurts little more Pain Location: R knee cap Pain  Descriptors / Indicators: Grimacing Pain Intervention(s): Limited activity within patient's tolerance;Monitored during session;Repositioned         Frequency  Min 3X/week        Progress Toward Goals  OT Goals(current goals can now be found in the care plan section)  Progress towards OT goals: Progressing toward goals  Acute Rehab OT Goals Patient Stated Goal: rehab OT Goal Formulation: With patient/family Time For Goal Achievement: 10/28/20 Potential to Achieve Goals: Good  Plan Discharge plan remains appropriate;Frequency remains appropriate       AM-PAC OT "6 Clicks" Daily Activity     Outcome Measure   Help from another person eating meals?: Total Help from another person taking care of personal grooming?: Total Help from another person toileting, which includes using toliet, bedpan, or urinal?: Total Help from another person bathing (including washing, rinsing, drying)?: Total Help from another person to put on and taking off regular upper body clothing?: Total Help from another person to put on and taking off regular lower body clothing?: Total 6 Click Score: 6    End of Session    OT Visit Diagnosis: Other abnormalities of gait and mobility (R26.89);Hemiplegia and hemiparesis Hemiplegia - Right/Left: Right Hemiplegia - dominant/non-dominant: Dominant Hemiplegia - caused by: Cerebral infarction   Activity Tolerance Patient limited by fatigue   Patient Left in bed;with call bell/phone within reach;with bed alarm set;with family/visitor present   Nurse Communication Mobility status        Time: 7782-4235 OT Time Calculation (min): 30 min  Charges: OT General Charges $OT Visit: 1 Visit OT Treatments $Neuromuscular Re-education: 23-37 mins  Jackquline Denmark, MS, OTR/L , CBIS ascom [phone]  10/22/20, 4:40 PM

## 2020-10-22 NOTE — Progress Notes (Signed)
Inpatient Rehab Admissions Coordinator:   Note [employer] working to find out if pt has a Medicare ID.  Still following for CIR.   Estill Dooms, PT, DPT Admissions Coordinator [phone] 10/22/20  2:04 PM

## 2020-10-22 NOTE — Progress Notes (Signed)
PROGRESS NOTE    Raymond Kerr  ZWC:[id number] DOB: 1955-04-20 DOA: 10/06/2020 PCP: Barbette Reichmann, MD    Chief Complaint  Patient presents with   Weakness   slurred speech   Aphasia    Brief Narrative:   who presented to the emergency room with acute onset of diplopia and generalized weakness that started 8 hours prior to presentation.  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 -Continue ASA 81mg  daily + plavix 75mg  daily x90 days given severe intracranial stenosis f/b ASA 81mg  daily after that - Atorvastatin 80mg  daily -Family declined PEG tube placement, currently on dysphagia diet need to be fed Awaiting for placement  Uncontrolled diabetes, office hyperglycemia A1c 10 Does not appear he was on insulin prior to admission Currently on insulin  History of hypertension Currently blood pressure stable without medication   Hyponatremia, DC half normal saline Avoid hypotension due to severe intracranial stenosis Goals normotension, avoid SBP less than 110     Body mass index is 35.37 kg/m.  Seen by dietician.  I agree with the assessment and plan as outlined below:  Nutrition Status: Nutrition Problem: Inadequate oral  intake Etiology:  (decreased functional status s/p CVA) Signs/Symptoms: meal completion < 50% (and pt requiring full assistance with feeding) Interventions: Refer to RD note for recommendations  .     Skin Assessment:  I have examined the patient's skin and I agree with the wound assessment as performed by the wound care RN as outlined below:  Pressure Injury 10/15/20 Coccyx Medial Stage 2 -  Partial thickness loss of dermis presenting as a shallow open injury with a red, pink wound bed without slough. 1.0cm.  x 0.2 cm skin tear appearance middle of coccyx crack. clear moisture barrier applied (Active)  10/15/20 2300  Location: Coccyx  Location Orientation: Medial  Staging: Stage 2 -  Partial thickness loss of dermis presenting as a shallow open injury with a red, pink wound bed without slough.  Wound Description (Comments): 1.0cm.  x 0.2 cm skin tear appearance middle of coccyx crack. clear moisture barrier applied followed by foam dressing  Present on Admission:     Unresulted Labs (From admission, onward)     Start     Ordered   10/23/20 0500  Basic metabolic panel  Tomorrow morning,   R       Question:  Specimen collection method  Answer:  Lab=Lab collect   10/22/20 1758              DVT prophylaxis:   Lovenox   Code Status:full Family Communication: daughter at bedside Disposition:   Status is: Inpatient  Dispo: The patient is from: home              Anticipated d/c is to: Difficult placement  Anticipated d/c date is: To be determined              Patient currently is medically stable to discharge  Consultants:  Neurology CIR Cardiology  Procedures:  TEE  Antimicrobials:   Anti-infectives (From admission, onward)    None           Objective: Vitals:   10/21/20 2318 10/22/20 0400 10/22/20 0930 10/22/20 1550  BP: (!) 148/90 (!) 142/76 (!) 147/78 138/73  Pulse: 84 88 70 79  Resp: 18 18 19 16   Temp: 98.3 F (36.8 C) 98.2 F  (36.8 C) 98.5 F (36.9 C) 99.9 F (37.7 C)  TempSrc:  Oral    SpO2: 96% 97% 100% 98%  Weight:      Height:        Intake/Output Summary (Last 24 hours) at 10/22/2020 1758 Last data filed at 10/21/2020 2000 Gross per 24 hour  Intake --  Output 200 ml  Net -200 ml   Filed Weights   10/06/20 2045 10/13/20 0624 10/15/20 2215  Weight: 120.4 kg 121.3 kg 118.3 kg    Examination:  General exam: calm, NAD, aphasic, right hemiplegia, follow command on the left Respiratory system: Clear to auscultation. Respiratory effort normal. Cardiovascular system: S1 & S2 heard, RRR.  Gastrointestinal system: Abdomen is nondistended, soft and nontender.  Normal bowel sounds heard. Central nervous system: Alert , follow command on the left side, aphasic, right hemiplegia  Extremities:  no edema Skin: Sacral skin breakdown Psychiatry: Calm, no agitation.     Data Reviewed: I have personally reviewed following labs and imaging studies  CBC: Recent Labs  Lab 10/17/20 0727 10/21/20 0450  WBC 5.2 5.3  NEUTROABS 2.9  --   HGB 12.6* 13.3  HCT 37.7* 39.9  MCV 89.3 88.7  PLT 266 296    Basic Metabolic Panel: Recent Labs  Lab 10/17/20 0727 10/18/20 0828 10/19/20 0430 10/20/20 0714  NA 136 136 134* 133*  K 3.5 3.6 3.7 3.7  CL 105 105 103 103  CO2 26 24 24 22   GLUCOSE 169* 121* 173* 122*  BUN 10 9 9 9   CREATININE 0.96 1.00 0.98 1.03  CALCIUM 8.5* 8.8* 9.0 8.8*  MG 1.6* 1.9 1.7 1.7  PHOS 2.3* 3.0 2.6 2.6    GFR: Estimated Creatinine Clearance: 95 mL/min (by C-G formula based on SCr of 1.03 mg/dL).  Liver Function Tests: No results for input(s): AST, ALT, ALKPHOS, BILITOT, PROT, ALBUMIN in the last 168 hours.  CBG: Recent Labs  Lab 10/21/20 2349 10/22/20 0410 10/22/20 0927 10/22/20 1129 10/22/20 1552  GLUCAP 158* 158* 125* 120* 175*     No results found for this or any previous visit (from the past 240 hour(s)).       Radiology Studies: No results  found.      Scheduled Meds:   stroke: mapping our early stages of recovery book   Does not apply Once   aspirin  81 mg Oral Daily   atorvastatin  80 mg Oral Daily   Chlorhexidine Gluconate Cloth  6 each Topical Daily   clopidogrel  75 mg Oral Daily   enoxaparin (LOVENOX) injection  0.5 mg/kg Subcutaneous Q24H   feeding supplement (NEPRO CARB STEADY)  237 mL Oral TID WC   insulin aspart  0-15 Units Subcutaneous Q4H   insulin glargine-yfgn  10 Units Subcutaneous QHS   megestrol  400 mg Oral Daily   Continuous Infusions:     LOS: 15 days   Time spent:  Greater than 50% of this time was spent in counseling, explanation of diagnosis, planning of further management, and coordination of care.   Voice Recognition [employer]ubin [place] dictation system was used to create this note, attempts have been made to correct errors. Please contact the author with questions and/or clarifications.   Albertine Grates, MD PhD FACP [facility]  Available via Epic secure chat 7am-7pm for nonurgent issues Please page for urgent issues To page the attending provider between 7A-7P or the covering provider during after hours 7P-7A, please log into the web site [email or web address] and access using universal [place] password for that web site. If you do not have the password, please call the hospital operator.    10/22/2020, 5:58 PM

## 2020-10-23 LAB — BASIC METABOLIC PANEL
Anion gap: 6 (ref 5–15)
BUN: 9 mg/dL (ref 8–23)
CO2: 23 mmol/L (ref 22–32)
Calcium: 9.1 mg/dL (ref 8.9–10.3)
Chloride: 107 mmol/L (ref 98–111)
Creatinine, Ser: 1 mg/dL (ref 0.61–1.24)
GFR, Estimated: >60 mL/min (ref >60)
Glucose, Bld: 163 mg/dL — ABNORMAL HIGH (ref 70–99)
Potassium: 3.8 mmol/L (ref 3.5–5.1)
Sodium: 136 mmol/L (ref 135–145)

## 2020-10-23 LAB — GLUCOSE, CAPILLARY
Glucose-Capillary: 110 mg/dL — ABNORMAL HIGH (ref 70–99)
Glucose-Capillary: 144 mg/dL — ABNORMAL HIGH (ref 70–99)
Glucose-Capillary: 152 mg/dL — ABNORMAL HIGH (ref 70–99)
Glucose-Capillary: 164 mg/dL — ABNORMAL HIGH (ref 70–99)
Glucose-Capillary: 181 mg/dL — ABNORMAL HIGH (ref 70–99)
Glucose-Capillary: 195 mg/dL — ABNORMAL HIGH (ref 70–99)

## 2020-10-23 MED ORDER — GERHARDT'S BUTT CREAM
TOPICAL_CREAM | Freq: Three times a day (TID) | CUTANEOUS | Status: DC
  Filled 2020-10-23: qty 1

## 2020-10-23 NOTE — Progress Notes (Signed)
PROGRESS NOTE    Raymond Kerr  LEX:[id number] DOB: 1955/09/03 DOA: 10/06/2020 PCP: Barbette Reichmann, MD    Chief Complaint  Patient presents with   Weakness   slurred speech   Aphasia    Brief Narrative:   who presented to the emergency room with acute onset of diplopia and generalized weakness that started 8 hours prior to presentation.  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 -Continue ASA 81mg  daily + plavix 75mg  daily x90 days given severe intracranial stenosis f/b ASA 81mg  daily after that - Atorvastatin 80mg  daily -Family declined PEG tube placement, currently on dysphagia diet need to be fed Awaiting for placement  Uncontrolled diabetes, office hyperglycemia A1c 10 Does not appear he was on insulin prior to admission Currently on insulin  History of hypertension Currently blood pressure stable without medication   Hyponatremia, Resolved after DC half normal saline Avoid hypotension due to severe intracranial stenosis Goals normotension, avoid SBP less than 110     Body mass index is 35.37 kg/m.  Seen by dietician.  I agree with the assessment and plan as outlined  below:  Nutrition Status: Nutrition Problem: Inadequate oral intake Etiology:  (decreased functional status s/p CVA) Signs/Symptoms: meal completion < 50% (and pt requiring full assistance with feeding) Interventions: Refer to RD note for recommendations  .monitor oral intake, calorie count attempted but unsuccessful since 8/27 due to lact of documentation, family is not interested in feeding tube after multiple discussion     Skin Assessment:  I have examined the patient's skin and I agree with the wound assessment as performed by the wound care RN as outlined below:  Pressure Injury 10/15/20 Coccyx Medial Stage 2 -  Partial thickness loss of dermis presenting as a shallow open injury with a red, pink wound bed without slough. 1.0cm.  x 0.2 cm skin tear appearance middle of coccyx crack. clear moisture barrier applied (Active)  10/15/20 2300  Location: Coccyx  Location Orientation: Medial  Staging: Stage 2 -  Partial thickness loss of dermis presenting as a shallow open injury with a red, pink wound bed without slough.  Wound Description (Comments): 1.0cm.  x 0.2 cm skin tear appearance middle of coccyx crack. clear moisture barrier applied followed by foam dressing  Present on Admission:     Unresulted Labs (From admission, onward)    None         DVT prophylaxis:   Lovenox   Code Status:full Family Communication: daughter at bedside daily Disposition:   Status is: Inpatient  Dispo: The patient is from: home              Anticipated d/c is to: Difficult placement  Anticipated d/c date is: To be determined              Patient currently is medically stable to discharge  Consultants:  Neurology CIR Cardiology  Procedures:  TEE  Antimicrobials:   Anti-infectives (From admission, onward)    None           Objective: Vitals:   10/22/20 1550 10/22/20 2021 10/23/20 0009 10/23/20 0405  BP: 138/73 122/64 (!) 160/76 (!) 152/81  Pulse: 79 75  86 76  Resp: 16 18 16 18   Temp: 99.9 F (37.7 C) 98.1 F (36.7 C) 99.1 F (37.3 C) 99 F (37.2 C)  TempSrc:      SpO2: 98% 99% 98% 100%  Weight:      Height:       No intake or output data in the 24 hours ending 10/23/20 0754  Filed Weights   10/06/20 2045 10/13/20 0624 10/15/20 2215  Weight: 120.4 kg 121.3 kg 118.3 kg    Examination:  General exam: calm, NAD, aphasic, right hemiplegia, follow command on the left Respiratory system: Clear to auscultation. Respiratory effort normal. Cardiovascular system: S1 & S2 heard, RRR.  Gastrointestinal system: Abdomen is nondistended, soft and nontender.  Normal bowel sounds heard. Central nervous system: Alert , follow command on the left side, aphasic, right hemiplegia  Extremities:  no edema Skin: Sacral skin breakdown Psychiatry: Calm, no agitation.     Data Reviewed: I have personally reviewed following labs and imaging studies  CBC: Recent Labs  Lab 10/17/20 0727 10/21/20 0450  WBC 5.2 5.3  NEUTROABS 2.9  --   HGB 12.6* 13.3  HCT 37.7* 39.9  MCV 89.3 88.7  PLT 266 296    Basic Metabolic Panel: Recent Labs  Lab 10/17/20 0727 10/18/20 0828 10/19/20 0430 10/20/20 0714 10/23/20 0453  NA 136 136 134* 133* 136  K 3.5 3.6 3.7 3.7 3.8  CL 105 105 103 103 107  CO2 26 24 24 22 23   GLUCOSE 169* 121* 173* 122* 163*  BUN 10 9 9 9 9   CREATININE 0.96 1.00 0.98 1.03 1.00  CALCIUM 8.5* 8.8* 9.0 8.8* 9.1  MG 1.6* 1.9 1.7 1.7  --   PHOS 2.3* 3.0 2.6 2.6  --     GFR: Estimated Creatinine Clearance: 97.8 mL/min (by C-G formula based on SCr of 1 mg/dL).  Liver Function Tests: No results for input(s): AST, ALT, ALKPHOS, BILITOT, PROT, ALBUMIN in the last 168 hours.  CBG: Recent Labs  Lab 10/22/20 1129 10/22/20 1552 10/22/20 2126 10/23/20 0012 10/23/20 0404  GLUCAP 120* 175* 147* 181* 164*     No results found for this or any previous visit (from the past 240 hour(s)).       Radiology Studies: No results  found.      Scheduled Meds:   stroke: mapping our early stages of recovery book   Does not apply Once   aspirin  81 mg Oral Daily   atorvastatin  80 mg Oral Daily   Chlorhexidine Gluconate Cloth  6 each Topical Daily   clopidogrel  75 mg Oral Daily   enoxaparin (LOVENOX) injection  0.5 mg/kg Subcutaneous Q24H   feeding supplement (NEPRO CARB STEADY)  237 mL Oral TID WC   Gerhardt's butt cream   Topical TID   insulin aspart  0-15 Units Subcutaneous Q4H   insulin glargine-yfgn  10 Units Subcutaneous QHS   megestrol  400 mg Oral Daily   Continuous Infusions:     LOS: 16  days   Time spent: Greater than 50% of this time was spent in counseling, explanation of diagnosis, planning of further management, and coordination of care.   Voice Recognition Reubin Milan dictation system was used to create this note, attempts have been made to correct errors. Please contact the author with questions and/or clarifications.   Albertine Grates, MD PhD FACP [facility]  Available via Epic secure chat 7am-7pm for nonurgent issues Please page for urgent issues To page the attending provider between 7A-7P or the covering provider during after hours 7P-7A, please log into the web site [email or web address] and access using universal [place] password for that web site. If you do not have the password, please call the hospital operator.    10/23/2020, 7:54 AM

## 2020-10-23 NOTE — Consult Note (Signed)
WOC Nurse Consult Note: Reason for Consult:area of skin loss at the gluteal cleft.  Moisture vs partial thickness PI. Stage 2 PI noted on 8/24. Wound type: moisture, intertriginous dermatitis  ICD-10 CM Codes for Irritant Dermatitis L30.4  - Erythema intertrigo. Also used for abrasion of the hand, chafing of the skin, dermatitis due to sweating and friction, friction dermatitis, friction eczema, and genital/thigh intertrigo.   Pressure Injury POA: Yes Measurement:1cm x 0.2cm x 0.1cm Wound FBP:ZWCH, moist Drainage (amount, consistency, odor) scant serous Periwound:intact, dry Dressing procedure/placement/frequency: I have provided Nursing with guidance for the care of this area of skin loss using a thin layer of Gerhart's Butt Cream, a compounded prescriptive of 1:1:1 zinc oxide, hydrocortisone cream and lotrimin cream This is to be applied three times daily and PRN soiling. Turning and repositioning is in place, a pressure redistribution chair cushion is provided today as well as pressure redistribution heel boots for PI prevention.  WOC nursing team will not follow, but will remain available to this patient, the nursing and medical teams.  Please re-consult if needed. Thanks, Ladona Mow, MSN, RN, GNP, [facility]  Pager# [phone]
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
## 2020-10-23 NOTE — Progress Notes (Signed)
Physical Therapy Treatment Patient Details Name: Raymond Kerr MRN: [id number] DOB: [birth year] Today's Date: 10/23/2020    History of Present Illness Pt is a 65 yo M w/ PMH of HTN, DM2, HL who presented with multiple neurologic sx and was found to have 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 3 days after admission he developed worsening aphasia and R sided weakness, transferred to ICU and underwent repeat imaging. Repeat MRI brain revealed extension of both his L frontal and brainstem infarcts as well as new watershed infarct in the internal border zone on the L. (per chart).    PT Comments    Pt more alert this tx session with slightly improved vocalization, able to verbalize desire for additional sit <> stand repetition. Pt requires Max-A + 2, physical assist for bed mobility, sit <> stand, and squat pivot transfers. Pt able to achieve full upright stance with tactile cues for trunk and hip extension intermittently. Utilized Chiropractor for LUE support w/ R knee block for sit <> stand technique this session. Skilled PT intervention is indicated to address deficits in function, mobility, and to return to PLOF as able.  Discharge recommendations remain CIR.  Follow Up Recommendations  CIR     Equipment Recommendations  Other (comment) (TBD next venue of care)    Recommendations for Other Services       Precautions / Restrictions Precautions Precautions: Fall Precaution Comments: R hemi Restrictions Weight Bearing Restrictions: No    Mobility  Bed Mobility Overal bed mobility: Needs Assistance Bed Mobility: Sit to Supine       Sit to supine: Max assist;+2 for physical assistance        Transfers Overall transfer level: Needs assistance Equipment used: 2 person hand held assist Transfers: Stand Pivot Transfers;Sit to/from Stand Sit to Stand: +2 physical assistance;+2 safety/equipment;Max assist Stand pivot transfers: Max  assist;+2 physical assistance          Ambulation/Gait                 Stairs             Wheelchair Mobility    Modified Rankin (Stroke Patients Only)       Balance Overall balance assessment: Needs assistance Sitting-balance support: Feet supported;Single extremity supported Sitting balance-Leahy Scale: Fair Sitting balance - Comments: requires initial support due to posterior lean, but corrects with cues Postural control: Right lateral lean Standing balance support: During functional activity;Bilateral upper extremity supported Standing balance-Leahy Scale: Poor Standing balance comment: Requires max-A + 2 for static stance                            Cognition Arousal/Alertness: Awake/alert Behavior During Therapy: WFL for tasks assessed/performed Overall Cognitive Status: Impaired/Different from baseline Area of Impairment: Problem solving;Following commands;Orientation;Attention;Safety/judgement;Awareness;Memory                 Orientation Level: Person   Memory: Decreased recall of precautions;Decreased short-term memory Following Commands: Follows one step commands inconsistently;Follows one step commands with increased time Safety/Judgement: Decreased awareness of safety;Decreased awareness of deficits Awareness: Intellectual Problem Solving: Slow processing;Decreased initiation;Difficulty sequencing;Requires verbal cues;Requires tactile cues        Exercises Other Exercises Other Exercises: sit <> stand x 5 (2 from bed, 3 from recliner in front of mirror), max-A + 2 physical assist    General Comments        Pertinent Vitals/Pain Pain Assessment:  Faces Faces Pain Scale: Hurts little more Pain Location: R knee cap Pain Descriptors / Indicators: Grimacing Pain Intervention(s): Limited activity within patient's tolerance;Monitored during session;Repositioned    Home Living                      Prior Function             PT Goals (current goals can now be found in the care plan section) Progress towards PT goals: Progressing toward goals    Frequency    7X/week      PT Plan Current plan remains appropriate    Co-evaluation              AM-PAC PT "6 Clicks" Mobility   Outcome Measure  Help needed turning from your back to your side while in a flat bed without using bedrails?: A Lot Help needed moving from lying on your back to sitting on the side of a flat bed without using bedrails?: A Lot Help needed moving to and from a bed to a chair (including a wheelchair)?: A Lot Help needed standing up from a chair using your arms (e.g., wheelchair or bedside chair)?: A Lot Help needed to walk in hospital room?: Total Help needed climbing 3-5 steps with a railing? : Total 6 Click Score: 10    End of Session Equipment Utilized During Treatment: Gait belt Activity Tolerance: Patient tolerated treatment well;Patient limited by fatigue Patient left: in chair;with call bell/phone within reach;with chair alarm set;with family/visitor present Nurse Communication: Mobility status PT Visit Diagnosis: Other abnormalities of gait and mobility (R26.89);Muscle weakness (generalized) (M62.81);Other symptoms and signs involving the nervous system (R29.898);Hemiplegia and hemiparesis;Difficulty in walking, not elsewhere classified (R26.2) Hemiplegia - Right/Left: Right Hemiplegia - caused by: Cerebral infarction     Time: 4098-1191 PT Time Calculation (min) (ACUTE ONLY): 54 min  Charges:                        [employer], SPT

## 2020-10-24 ENCOUNTER — Other Ambulatory Visit: Payer: Self-pay

## 2020-10-24 ENCOUNTER — Inpatient Hospital Stay (HOSPITAL_COMMUNITY)
Admission: RE | Admit: 2020-10-24 | Discharge: 2020-11-25 | DRG: 057 | Disposition: A | Discharge status: Home / Self Care | Payer: Medicare Other | Source: Intra-hospital | Attending: Physical Medicine and Rehabilitation | Admitting: Physical Medicine and Rehabilitation

## 2020-10-24 ENCOUNTER — Encounter (HOSPITAL_COMMUNITY): Payer: Self-pay | Admitting: Obstetrics and Gynecology

## 2020-10-24 DIAGNOSIS — K5900 Constipation, unspecified: Secondary | ICD-10-CM | POA: Diagnosis not present

## 2020-10-24 DIAGNOSIS — K219 Gastro-esophageal reflux disease without esophagitis: Secondary | ICD-10-CM | POA: Diagnosis present

## 2020-10-24 DIAGNOSIS — I69321 Dysphasia following cerebral infarction: Secondary | ICD-10-CM | POA: Diagnosis not present

## 2020-10-24 DIAGNOSIS — I69392 Facial weakness following cerebral infarction: Secondary | ICD-10-CM

## 2020-10-24 DIAGNOSIS — Z794 Long term (current) use of insulin: Secondary | ICD-10-CM | POA: Diagnosis not present

## 2020-10-24 DIAGNOSIS — H532 Diplopia: Secondary | ICD-10-CM | POA: Diagnosis present

## 2020-10-24 DIAGNOSIS — E1165 Type 2 diabetes mellitus with hyperglycemia: Secondary | ICD-10-CM | POA: Diagnosis not present

## 2020-10-24 DIAGNOSIS — E785 Hyperlipidemia, unspecified: Secondary | ICD-10-CM | POA: Diagnosis present

## 2020-10-24 DIAGNOSIS — Z7902 Long term (current) use of antithrombotics/antiplatelets: Secondary | ICD-10-CM

## 2020-10-24 DIAGNOSIS — R7401 Elevation of levels of liver transaminase levels: Secondary | ICD-10-CM | POA: Diagnosis not present

## 2020-10-24 DIAGNOSIS — I69390 Apraxia following cerebral infarction: Secondary | ICD-10-CM

## 2020-10-24 DIAGNOSIS — I69351 Hemiplegia and hemiparesis following cerebral infarction affecting right dominant side: Secondary | ICD-10-CM | POA: Diagnosis present

## 2020-10-24 DIAGNOSIS — Z7982 Long term (current) use of aspirin: Secondary | ICD-10-CM

## 2020-10-24 DIAGNOSIS — Z6835 Body mass index (BMI) 35.0-35.9, adult: Secondary | ICD-10-CM

## 2020-10-24 DIAGNOSIS — G8191 Hemiplegia, unspecified affecting right dominant side: Secondary | ICD-10-CM

## 2020-10-24 DIAGNOSIS — I69951 Hemiplegia and hemiparesis following unspecified cerebrovascular disease affecting right dominant side: Secondary | ICD-10-CM

## 2020-10-24 DIAGNOSIS — R1310 Dysphagia, unspecified: Secondary | ICD-10-CM

## 2020-10-24 DIAGNOSIS — I69391 Dysphagia following cerebral infarction: Secondary | ICD-10-CM | POA: Diagnosis not present

## 2020-10-24 DIAGNOSIS — G8110 Spastic hemiplegia affecting unspecified side: Secondary | ICD-10-CM

## 2020-10-24 DIAGNOSIS — E669 Obesity, unspecified: Secondary | ICD-10-CM | POA: Diagnosis present

## 2020-10-24 DIAGNOSIS — Z79899 Other long term (current) drug therapy: Secondary | ICD-10-CM | POA: Diagnosis not present

## 2020-10-24 DIAGNOSIS — R339 Retention of urine, unspecified: Secondary | ICD-10-CM | POA: Diagnosis not present

## 2020-10-24 DIAGNOSIS — I69320 Aphasia following cerebral infarction: Secondary | ICD-10-CM | POA: Diagnosis not present

## 2020-10-24 DIAGNOSIS — R4701 Aphasia: Secondary | ICD-10-CM

## 2020-10-24 DIAGNOSIS — I69398 Other sequelae of cerebral infarction: Secondary | ICD-10-CM

## 2020-10-24 DIAGNOSIS — I6381 Other cerebral infarction due to occlusion or stenosis of small artery: Secondary | ICD-10-CM | POA: Diagnosis present

## 2020-10-24 DIAGNOSIS — I10 Essential (primary) hypertension: Secondary | ICD-10-CM

## 2020-10-24 DIAGNOSIS — R7989 Other specified abnormal findings of blood chemistry: Secondary | ICD-10-CM

## 2020-10-24 DIAGNOSIS — I639 Cerebral infarction, unspecified: Secondary | ICD-10-CM | POA: Diagnosis present

## 2020-10-24 DIAGNOSIS — H02402 Unspecified ptosis of left eyelid: Secondary | ICD-10-CM | POA: Diagnosis present

## 2020-10-24 HISTORY — DX: Cerebral infarction, unspecified: I63.9

## 2020-10-24 LAB — GLUCOSE, CAPILLARY
Glucose-Capillary: 131 mg/dL — ABNORMAL HIGH (ref 70–99)
Glucose-Capillary: 135 mg/dL — ABNORMAL HIGH (ref 70–99)
Glucose-Capillary: 143 mg/dL — ABNORMAL HIGH (ref 70–99)
Glucose-Capillary: 146 mg/dL — ABNORMAL HIGH (ref 70–99)
Glucose-Capillary: 151 mg/dL — ABNORMAL HIGH (ref 70–99)
Glucose-Capillary: 226 mg/dL — ABNORMAL HIGH (ref 70–99)

## 2020-10-24 MED ORDER — ASPIRIN 81 MG PO CHEW: 81.0000 mg | CHEWABLE_TABLET | Freq: Every day | ORAL | Status: AC

## 2020-10-24 MED ORDER — ASPIRIN 81 MG PO CHEW
81.0000 mg | CHEWABLE_TABLET | Freq: Every day | ORAL | Status: DC
  Administered 2020-10-25 – 2020-11-25 (×32): 81 mg via ORAL
  Filled 2020-10-24 (×33): qty 1

## 2020-10-24 MED ORDER — ONDANSETRON HCL 4 MG PO TABS
4.0000 mg | ORAL_TABLET | Freq: Four times a day (QID) | ORAL | Status: DC | PRN
Start: 2020-10-24 — End: 2020-11-26
  Administered 2020-11-13: 4 mg via ORAL
  Filled 2020-10-24 (×2): qty 1

## 2020-10-24 MED ORDER — ACETAMINOPHEN 325 MG PO TABS
650.0000 mg | ORAL_TABLET | Freq: Four times a day (QID) | ORAL | Status: DC | PRN
  Administered 2020-10-26 – 2020-10-31 (×3): 650 mg via ORAL
  Filled 2020-10-24 (×4): qty 2

## 2020-10-24 MED ORDER — MEGESTROL ACETATE 400 MG/10ML PO SUSP
400.0000 mg | Freq: Every day | ORAL | Status: DC
  Administered 2020-10-25 – 2020-11-13 (×20): 400 mg via ORAL
  Filled 2020-10-24 (×20): qty 10

## 2020-10-24 MED ORDER — LOSARTAN POTASSIUM 25 MG PO TABS: 25.0000 mg | ORAL_TABLET | Freq: Every day | ORAL | 0 refills | Status: DC

## 2020-10-24 MED ORDER — NEPRO/CARBSTEADY PO LIQD
237.0000 mL | Freq: Three times a day (TID) | ORAL | Status: DC
  Administered 2020-10-24 – 2020-10-26 (×6): 237 mL via ORAL

## 2020-10-24 MED ORDER — LIVING WELL WITH DIABETES BOOK
Freq: Once | Status: AC
  Filled 2020-10-24: qty 1

## 2020-10-24 MED ORDER — SENNOSIDES 8.8 MG/5ML PO SYRP
5.0000 mL | ORAL_SOLUTION | Freq: Every evening | ORAL | Status: DC | PRN
  Administered 2020-10-28 – 2020-11-14 (×3): 5 mL via ORAL
  Filled 2020-10-24 (×5): qty 5

## 2020-10-24 MED ORDER — CLOPIDOGREL BISULFATE 75 MG PO TABS
75.0000 mg | ORAL_TABLET | Freq: Every day | ORAL | Status: DC
  Administered 2020-10-25 – 2020-11-25 (×32): 75 mg via ORAL
  Filled 2020-10-24 (×33): qty 1

## 2020-10-24 MED ORDER — ONDANSETRON HCL 4 MG/2ML IJ SOLN: 4.0000 mg | Freq: Four times a day (QID) | INTRAMUSCULAR | Status: DC | PRN

## 2020-10-24 MED ORDER — ENOXAPARIN SODIUM 60 MG/0.6ML IJ SOSY
0.5000 mg/kg | PREFILLED_SYRINGE | INTRAMUSCULAR | Status: DC
  Administered 2020-10-25 – 2020-11-17 (×24): 60 mg via SUBCUTANEOUS
  Filled 2020-10-24 (×25): qty 0.6

## 2020-10-24 MED ORDER — GERHARDT'S BUTT CREAM: 1.0000 "application " | TOPICAL_CREAM | Freq: Three times a day (TID) | CUTANEOUS | Status: DC

## 2020-10-24 MED ORDER — ATORVASTATIN CALCIUM 80 MG PO TABS
80.0000 mg | ORAL_TABLET | Freq: Every day | ORAL | Status: DC
  Administered 2020-10-25 – 2020-11-25 (×32): 80 mg via ORAL
  Filled 2020-10-24 (×33): qty 1

## 2020-10-24 MED ORDER — CLOPIDOGREL BISULFATE 75 MG PO TABS: 75.0000 mg | ORAL_TABLET | Freq: Every day | ORAL | Status: DC

## 2020-10-24 MED ORDER — INSULIN ASPART 100 UNIT/ML IJ SOLN
0.0000 [IU] | Freq: Three times a day (TID) | INTRAMUSCULAR | Status: DC
  Administered 2020-10-24: 5 [IU] via SUBCUTANEOUS
  Administered 2020-10-24 – 2020-10-25 (×2): 2 [IU] via SUBCUTANEOUS
  Administered 2020-10-25: 3 [IU] via SUBCUTANEOUS
  Administered 2020-10-25: 2 [IU] via SUBCUTANEOUS
  Administered 2020-10-25 – 2020-10-27 (×7): 3 [IU] via SUBCUTANEOUS
  Administered 2020-10-27 (×2): 2 [IU] via SUBCUTANEOUS
  Administered 2020-10-28: 3 [IU] via SUBCUTANEOUS
  Administered 2020-10-28: 5 [IU] via SUBCUTANEOUS
  Administered 2020-10-28 – 2020-10-29 (×5): 3 [IU] via SUBCUTANEOUS
  Administered 2020-10-29 – 2020-10-30 (×2): 2 [IU] via SUBCUTANEOUS

## 2020-10-24 MED ORDER — INSULIN GLARGINE-YFGN 100 UNIT/ML ~~LOC~~ SOLN
10.0000 [IU] | Freq: Every day | SUBCUTANEOUS | Status: DC
  Administered 2020-10-24 – 2020-11-12 (×19): 10 [IU] via SUBCUTANEOUS
  Filled 2020-10-24 (×21): qty 0.1

## 2020-10-24 MED ORDER — INSULIN ASPART 100 UNIT/ML IJ SOLN: 0.0000 [IU] | INTRAMUSCULAR | Status: DC

## 2020-10-24 MED ORDER — INSULIN GLARGINE-YFGN 100 UNIT/ML ~~LOC~~ SOLN: 10.0000 [IU] | Freq: Every day | SUBCUTANEOUS | 11 refills | Status: DC

## 2020-10-24 MED ORDER — ACETAMINOPHEN 650 MG RE SUPP: 650.0000 mg | Freq: Four times a day (QID) | RECTAL | Status: DC | PRN

## 2020-10-24 MED ORDER — ENOXAPARIN SODIUM 60 MG/0.6ML IJ SOSY: 60.0000 mg | PREFILLED_SYRINGE | INTRAMUSCULAR | Status: DC

## 2020-10-24 MED ORDER — TRAZODONE HCL 50 MG PO TABS
25.0000 mg | ORAL_TABLET | Freq: Every evening | ORAL | Status: DC | PRN
  Administered 2020-11-18 – 2020-11-19 (×2): 25 mg via ORAL
  Filled 2020-10-24 (×3): qty 1

## 2020-10-24 MED ORDER — INSULIN ASPART 100 UNIT/ML IJ SOLN: 0.0000 [IU] | INTRAMUSCULAR | 11 refills | Status: DC

## 2020-10-24 MED ORDER — ATORVASTATIN CALCIUM 80 MG PO TABS: 80.0000 mg | ORAL_TABLET | Freq: Every day | ORAL | Status: DC
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
## 2020-10-24 NOTE — Progress Notes (Signed)
Occupational Therapy Treatment Patient Details Name: Raymond Kerr MRN: [id number] DOB: 1955-10-24 Today's Date: 10/24/2020    History of present illness Pt is a 65 yo M w/ PMH of HTN, DM2, HL who presented with multiple neurologic sx and was found to have 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 3 days after admission he developed worsening aphasia and R sided weakness, transferred to ICU and underwent repeat imaging. Repeat MRI brain revealed extension of both his L frontal and brainstem infarcts as well as new watershed infarct in the internal border zone on the L. (per chart).   OT comments  Upon entering the room, pt supine in bed with daughter, victoria, present. Pt is agreeable to OT intervention. Drink presented to R visual field with pt tracking slowing and reaching across midline with L UE to obtain. Pt noted to be holding and cued to swallow. No coughing noted. Supine >sit towards R side with mod A for trunk support and R LE. Pt sitting with min A for static sitting balance and mod multimodal cuing for midline orientation. Pt also pushing slightly to the R and assisted into L lateral leaning to return to midline multiple times. Two attempts to stand from bed with heavy max A to clean bed and R knee blocked. Lateral scoot transfer with total A of 2 towards the weak R side into chair. Pt returned back to bed at end of session with max A of 2 towards the L. Pt having difficulty coordinating scoot. He continues to nod head "yes" and "no" as well as attempts to say these words during session. Pt making great progress towards goals. Sit >supine with max A of 2. All needs within reach.  Follow Up Recommendations  CIR    Equipment Recommendations  Other (comment) (defer to next venue of care)       Precautions / Restrictions Precautions Precautions: Fall Precaution Comments: R hemi Restrictions Weight Bearing Restrictions: No       Mobility Bed  Mobility Overal bed mobility: Needs Assistance Bed Mobility: Sit to Supine;Supine to Sit     Supine to sit: HOB elevated;Mod assist Sit to supine: Max assist;+2 for physical assistance   General bed mobility comments: follows commands with increased time. He moved L LE to EOB and needs assist with R LE and trunk support    Transfers Overall transfer level: Needs assistance Equipment used: 2 person hand held assist            Lateral/Scoot Transfers: Max assist;Total assist;+2 physical assistance      Balance Overall balance assessment: Needs assistance Sitting-balance support: Feet supported;Single extremity supported Sitting balance-Leahy Scale: Fair     Standing balance support: During functional activity;Bilateral upper extremity supported Standing balance-Leahy Scale: Zero Standing balance comment: total A                           ADL either performed or assessed with clinical judgement      Cognition Arousal/Alertness: Awake/alert Behavior During Therapy: WFL for tasks assessed/performed Overall Cognitive Status: Impaired/Different from baseline Area of Impairment: Problem solving;Following commands;Orientation;Attention;Safety/judgement;Awareness;Memory                 Orientation Level: Person Current Attention Level: Focused Memory: Decreased recall of precautions;Decreased short-term memory Following Commands: Follows one step commands inconsistently;Follows one step commands with increased time Safety/Judgement: Decreased awareness of safety;Decreased awareness of deficits Awareness: Intellectual Problem Solving: Slow processing;Decreased initiation;Difficulty sequencing;Requires  verbal cues;Requires tactile cues                     Pertinent Vitals/ Pain       Pain Assessment: Faces Faces Pain Scale: No hurt         Frequency  Min 3X/week        Progress Toward Goals  OT Goals(current goals can now be found in the  care plan section)  Progress towards OT goals: Progressing toward goals  Acute Rehab OT Goals Patient Stated Goal: rehab OT Goal Formulation: With patient/family Time For Goal Achievement: 10/28/20 Potential to Achieve Goals: Good  Plan Discharge plan remains appropriate;Frequency remains appropriate       AM-PAC OT "6 Clicks" Daily Activity     Outcome Measure   Help from another person eating meals?: Total Help from another person taking care of personal grooming?: Total Help from another person toileting, which includes using toliet, bedpan, or urinal?: Total Help from another person bathing (including washing, rinsing, drying)?: Total Help from another person to put on and taking off regular upper body clothing?: Total Help from another person to put on and taking off regular lower body clothing?: Total 6 Click Score: 6    End of Session    OT Visit Diagnosis: Other abnormalities of gait and mobility (R26.89);Hemiplegia and hemiparesis Hemiplegia - Right/Left: Right Hemiplegia - dominant/non-dominant: Dominant Hemiplegia - caused by: Cerebral infarction   Activity Tolerance Patient limited by fatigue   Patient Left in bed;with call bell/phone within reach;with bed alarm set;with family/visitor present   Nurse Communication Mobility status        Time: 9323-5573 OT Time Calculation (min): 40 min  Charges: OT General Charges $OT Visit: 1 Visit OT Treatments $Neuromuscular Re-education: 38-52 mins  Jackquline Denmark, MS, OTR/L , CBIS ascom [phone]  10/24/20, 2:07 PM

## 2020-10-24 NOTE — Plan of Care (Signed)
Care plan completed

## 2020-10-24 NOTE — Progress Notes (Signed)
Inpatient Rehab Admissions Coordinator:   [facility] arranged for 12-1 pickup.  Going to room 4w22 at [facility].    Estill Dooms, PT, DPT Admissions Coordinator [phone] 10/24/20  11:03 AM

## 2020-10-24 NOTE — Progress Notes (Signed)
Patient arrived via EMS stretcher. Patient was alert and able to nod or gesture appropriately to direct questions. Patient denies pain. Full head to toe skin assessment was performed with Lavonna Rua, RN. Patient in no distress at this time and resting in bed with all needs met.

## 2020-10-24 NOTE — TOC Transition Note (Signed)
Transition of Care [facility]) - CM/SW Discharge Note   Patient Details  Name: Harm Jou MRN: [id number] Date of Birth: March 03, 1955  Transition of Care [facility]) CM/SW Contact:  Allayne Butcher, RN Phone Number: 10/24/2020, 10:27 AM   Clinical Narrative:    Patient is medically cleared for discharge to CIR today.  [facility] has a bed and will accept today.  [facility] will arrange [facility] for transport.  Daughter [name] at the bedside and aware of discharge to CIR today.  [name] was able to verify that patient's SS# is correct in the system.     Final next level of care: IP Rehab Facility Barriers to Discharge: Barriers Resolved   Patient Goals and CMS Choice Patient states their goals for this hospitalization and ongoing recovery are:: Plan for CIR CMS Medicare.gov Compare Post Acute Care list provided to:: Patient Represenative (must comment) Choice offered to / list presented to : Adult Children  Discharge Placement              Patient chooses bed at: Other - please specify in the comment section below: ([facility]) Patient to be transferred to facility by: [facility] Name of family member notified: [name] Patient and family notified of of transfer: 10/24/20  Discharge Plan and Services   Discharge Planning Services: CM Consult Post Acute Care Choice: IP Rehab          DME Arranged: N/A DME Agency: NA       HH Arranged: NA HH Agency: NA        Social Determinants of Health (SDOH) Interventions     Readmission Risk Interventions No flowsheet data found.

## 2020-10-24 NOTE — Progress Notes (Signed)
Inpatient Rehab Admissions Coordinator:   I have a bed for this patient to admit to CIR today.  Medicare A/B pending.  Will await final confirmation from Dr. Roda Shutters and let pt/family and TOC know.  Once pt confirmed ready for d/c I will arrange transport with [facility].   Estill Dooms, PT, DPT Admissions Coordinator [phone] 10/24/20  9:59 AM

## 2020-10-24 NOTE — Discharge Summary (Signed)
Discharge Summary  Raymond Kerr AVW:[id number] DOB: 1955-06-12  PCP: Barbette Reichmann, MD  Admit date: 10/06/2020 Discharge date: 10/24/2020  Time spent: , more than 50% time spent on coordination of care.  Patient is discharged to inpatient rehab at [facility]  Recommendations for Outpatient Follow-up:  F/u with PCP within a week  for hospital discharge follow up, repeat cbc/bmp at follow up Follow-up with neurology, for stroke follow-up ,ambulatory referral to [facility] made Follow-up with cardiology for Zio patch, Dr. Arnoldo Hooker   Discharge Diagnoses:  Active Hospital Problems   Diagnosis Date Noted   Hypotension 10/19/2020   Hypokalemia 10/17/2020   Hypomagnesemia 10/17/2020   Hypophosphatemia 10/17/2020   Pressure injury of skin 10/16/2020   Cerebrovascular accident (CVA) (HCC)    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) 10/07/2020   Paralysis of left third cranial nerve    Right sided weakness    TIA (transient ischemic attack) 10/06/2020   Acute ischemic left ACA stroke (HCC) 10/06/2020    Resolved Hospital Problems  No resolved problems to display.    Discharge Condition: stable  Diet recommendation: heart healthy/carb modified, aspiration precaution Dys 3 diet/thin liquid   Filed Weights   10/06/20 2045 10/13/20 0624 10/15/20 2215  Weight: 120.4 kg 121.3 kg 118.3 kg    History of present illness: ( per admitting MD Dr Arville Care)  Raymond Kerr is a 65 y.o. African-American male with medical history significant for type II diabetes mellitus, GERD and hypertension, who presented to the emergency room with acute onset of diplopia and generalized weakness that started 8 hours prior to presentation.  The patient has been having expressive dysphasia.  His fiance who accompanied him stated that his symptoms started around midnight on 8/13 with dysarthria and diplopia but he was more confused.  He was noted in the ER  to have left facial droop and was unable to lift his left heel.  They went to [place] on Friday and he started having symptoms on Saturday night with double vision.  They went to bed at 12 midnight and at 6 AM she noted that he knocked food on the floor and was later starting to have slurred speech.  He was feeling generally weak and unable to ambulate however without unilateral focal muscle weakness or paresthesias.  No chest pain or dyspnea or palpitations.  No cough or wheezing.  No urinary or stool incontinence.  No vertigo or tinnitus.  No witnessed seizures.  ED Course: Blood pressure was 151/84 with otherwise normal vital signs.  Labs revealed a blood glucose of 260 with otherwise unremarkable CMP.  High-sensitivity troponin was 62 and later 68.  Lipid panel showed total cholesterol 199 and LDL of 136.  CBC was within normal.  Alcohol level was less than 10. EKG as reviewed by me : Showed normal sinus rhythm with rate of 84 with Q waves anteroseptally. Imaging: Noncontrast head CT scan revealed: 1. Low-density areas within the LEFT thalamus and RIGHT frontal lobe, compatible with subacute to chronic infarcts, favor chronic. 2. Additional chronic ischemic changes within the white matter regions and LEFT cerebellum. 3. No intracranial hemorrhage. No significant mass effect, midline shift or herniation.  2 view chest ray showed no acute cardiopulmonary disease.  MRI with MRA is currently pending.  The patient was given 4 baby aspirin.  He will be admitted to an observation medical monitored bed for further evaluation and management.  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 -Continue ASA  daily + plavix  daily x90 days given severe intracranial stenosis f/b ASA  daily after that - Atorvastatin  daily -Family declined PEG tube placement, currently on dysphagia 3diet/thin liquid need to be fed -d/c to CIR   Uncontrolled diabetes, office hyperglycemia A1c 10 Does not appear he was on insulin prior to admission Currently on insulin   History of hypertension Avoid hypotension due to severe intracranial stenosis Goals normotension, avoid SBP less than 110 Bp meds held initially, gradually restart  low dose losartan with holding parameters     Hyponatremia, Resolved after DC half normal saline        Body mass index is 35.37 kg/m.Marland Kitchen   Seen by dietician.  I agree with the assessment and plan as outlined below:   Nutrition Status: Nutrition Problem: Inadequate oral intake Etiology:  (decreased functional status s/p CVA) Signs/Symptoms: meal completion < 50% (and pt requiring full assistance with feeding) Interventions: Refer to RD note for recommendations   .monitor oral intake, calorie count attempted but unsuccessful since 8/27 due to lact of documentation, family is not interested in feeding tube after multiple discussion       Skin Assessment:   I have examined the patient's skin and I agree with the wound assessment as performed by the wound care RN as outlined below:   Pressure Injury 10/15/20 Coccyx Medial Stage 2 -  Partial thickness loss of dermis presenting as a shallow open injury with a red, pink wound bed without slough. 1.0cm.  x 0.2 cm skin tear appearance middle of  coccyx crack. clear moisture barrier applied (Active)  10/15/20 2300  Location: Coccyx  Location Orientation: Medial  Staging: Stage 2 -  Partial thickness loss of dermis presenting as a shallow open injury with a red, pink wound bed without slough.  Wound Description (Comments): 1.0cm.  x 0.2 cm skin tear appearance middle of coccyx crack. clear moisture barrier applied followed by foam dressing  Present on Admission:       Seen by wound care who recommended:  a thin layer of Gerhart's Butt Cream, a compounded prescriptive of 1:1:1 zinc oxide, hydrocortisone cream and lotrimin cream This is to be applied three times daily and PRN soiling. Turning and repositioning is in place, a pressure redistribution chair cushion is provided today as well as pressure redistribution heel boots for PI prevention.     Consultants:  Neurology CIR Cardiology   Procedures:  TEE   Antimicrobials: Anti-infectives (From admission, onward)    None         Discharge Exam: BP (!) 142/77   Pulse 68   Temp 98.2 F (36.8 C) (Oral)   Resp 17   Ht 6' (1.829 m)   Wt 118.3 kg   SpO2 99%   BMI 35.37 kg/m   General: NAD, he is aphasic, communicate by gesturing , right  hemiplegia  Cardiovascular: RRR Respiratory: normal respiratory effort  Discharge Instructions You were cared for by a hospitalist during your hospital stay. If you have any questions about your discharge medications or the care you received while you were in the hospital after you are discharged, you can call the unit and asked to speak with the hospitalist on call if the hospitalist that took care of you is not available. Once you are discharged, your primary care physician will handle any further medical issues. Please note that NO REFILLS for any discharge medications will be authorized once you are discharged, as it is imperative that you return to your primary care physician (or establish a relationship with a primary care  physician if you do not have one) for your aftercare needs so that they can reassess your need for medications and monitor your lab values.  Discharge Instructions     Ambulatory referral to Neurology   Complete by: As directed    An appointment is requested in approximately: 6-8   Diet - low sodium heart healthy   Complete by: As directed    Dys 3 diet, thin liquid NO Straws!!  Extra Gravy on meats, potatoes.  CREAM SOUPS ONLY! Please MINCED MEATS! Carb modified   Discharge wound care:   Complete by: As directed    Pressure off loading   Increase activity slowly   Complete by: As directed       Allergies as of 10/24/2020   No Known Allergies      Medication List     STOP taking these medications    glimepiride 4 MG tablet Commonly known as: AMARYL   pioglitazone 30 MG tablet Commonly known as: ACTOS       TAKE these medications    aspirin 81 MG chewable tablet Chew 1 tablet (81 mg total) by mouth daily. Start taking on: October 25, 2020   atorvastatin 80 MG tablet Commonly known as: LIPITOR Take 1 tablet (80 mg total) by mouth daily. Start taking on: October 25, 2020   clopidogrel 75 MG tablet Commonly known as: PLAVIX Take 1 tablet (75 mg total) by mouth daily. Start taking on: October 25, 2020   Gerhardt's butt cream Crea Apply 1 application topically 3 (three) times daily.   insulin aspart 100 UNIT/ML injection Commonly known as: novoLOG Inject 0-15 Units into the skin every 4 (four) hours.   insulin glargine-yfgn 100 UNIT/ML injection Commonly known as: SEMGLEE Inject 0.1 mLs (10 Units total) into the skin at bedtime.   losartan 25 MG tablet Commonly known as: Cozaar Take 1 tablet (25 mg total) by mouth daily. Hold if sbp less than 110 What changed:  medication strength how much to take additional instructions               Discharge Care Instructions  (From admission, onward)           Start     Ordered   10/24/20 0000   Discharge wound care:       Comments: Pressure off loading   10/24/20 1003           No Known Allergies  Follow-up Information     Lamar Blinks, MD Follow up in 1 week(s).   Specialty: Cardiology Contact information: [address] [facility] [place] [address] [phone]                  The results of significant diagnostics from this hospitalization (including imaging,  microbiology, ancillary and laboratory) are listed below for reference.    Significant Diagnostic Studies: DG Chest 2 View  Result Date: 10/06/2020 CLINICAL DATA:  Slurred speech and weakness EXAM: CHEST - 2 VIEW COMPARISON:  None. FINDINGS: The heart size and mediastinal contours are within normal limits. Both lungs are clear. The visualized skeletal structures are unremarkable. IMPRESSION: No active cardiopulmonary disease. Electronically Signed   By: Alcide Clever M.D.   On: 10/06/2020 01:57   CT HEAD WO CONTRAST ( )  Result Date: 10/10/2020 CLINICAL DATA:  New aphasia in a patient with known acute and subacute infarct EXAM: CT HEAD WITHOUT CONTRAST TECHNIQUE: Contiguous axial images were obtained from the base of the skull through the vertex without intravenous contrast. COMPARISON:  Brain MRI from yesterday FINDINGS: Brain: Known recent infarcts in the left ACA territory cortex, left thalamus, and left midbrain. Remote right lateral frontal and left cerebellar infarcts. Remote right thalamic lacunar infarct. No hemorrhage or visible new infarct. No hydrocephalus or masslike finding Vascular: No hyperdense vessel or unexpected calcification. Skull: Normal. Negative for fracture or focal lesion. Sinuses/Orbits: No acute finding. IMPRESSION: No hemorrhagic conversion or visible progression since brain MRI yesterday. Electronically Signed   By: Marnee Spring M.D.   On: 10/10/2020 09:36   CT HEAD WO CONTRAST ( )  Result Date: 10/08/2020 CLINICAL DATA:  Neuro  deficit, acute, stroke suspected. Elevated blood pressure. Left facial weakness. EXAM: CT HEAD WITHOUT CONTRAST TECHNIQUE: Contiguous axial images were obtained from the base of the skull through the vertex without intravenous contrast. COMPARISON:  MRI studies 2 days ago.  Head CT 3 days ago. FINDINGS: Brain: Old infarction in the left cerebellum as previously seen. No acute cerebellar infarction. Low-density in the left medial mid brain and left anteromedial thalamus consistent with known acute infarction in those locations. No evidence of extension, hemorrhage or significant mass effect. Low-density related to a left frontal white matter infarction at the inferior frontal lobe. Infarction at the medial left frontoparietal vertex visible as an area of low-density. Other smaller infarctions described by MRI are not visible by CT. No new or progressive lesions. There is old cortical and subcortical infarction in the right frontal lobe. Vascular: There is atherosclerotic calcification of the major vessels at the base of the brain. Skull: Negative Sinuses/Orbits: Clear/normal Other: None IMPRESSION: No evidence of new or progressive insult. No evidence of hemorrhagic transformation. Of the acute infarctions shown by MRI, the infarctions in the left midbrain, thalamus, inferior frontal lobe and left frontoparietal vertex are visible by CT is areas of low-density. Electronically Signed   By: Paulina Fusi M.D.   On: 10/08/2020 14:51   CT HEAD WO CONTRAST  Result Date: 10/05/2020 CLINICAL DATA:  Neuro deficit, acute, stroke suspected. Slurred speech and double vision for the past 8 hours. EXAM: CT HEAD WITHOUT CONTRAST TECHNIQUE: Contiguous axial images were obtained from the base of the skull through the vertex without intravenous contrast. COMPARISON:  None. FINDINGS: Brain: Chronic small vessel ischemic changes within the bilateral periventricular white matter regions. Small chronic-appearing infarct within the  LEFT cerebellum. Additional low-density areas within the LEFT thalamus and RIGHT frontal lobe, compatible with subacute to chronic infarcts, favor chronic. No parenchymal mass or hemorrhage. No mass effect, midline shift or herniation seen. Vascular: Chronic calcified atherosclerotic changes of the large vessels at the skull base. No unexpected hyperdense vessel. Skull: Normal. Negative for fracture or focal lesion. Sinuses/Orbits: No acute finding. Other: None. IMPRESSION: 1. Low-density areas within the LEFT thalamus  and RIGHT frontal lobe, compatible with subacute to chronic infarcts, favor chronic. 2. Additional chronic ischemic changes within the white matter regions and LEFT cerebellum. 3. No intracranial hemorrhage. No significant mass effect, midline shift or herniation. Electronically Signed   By: Bary RichardStan  Maynard M.D.   On: 10/05/2020 20:21   MR ANGIO HEAD WO CONTRAST  Result Date: 10/06/2020 CLINICAL DATA:  Seizure, slurred speech and double vision. EXAM: MRI HEAD WITHOUT CONTRAST MRA HEAD WITHOUT CONTRAST MRA NECK WITHOUT CONTRAST TECHNIQUE: Multiplanar, multiecho pulse sequences of the brain and surrounding structures were obtained without intravenous contrast. Angiographic images of the Circle of Willis were obtained using MRA technique without intravenous contrast. Angiographic images of the neck were obtained using MRA technique without intravenous contrast. Carotid stenosis measurements (when applicable) are obtained utilizing NASCET criteria, using the distal internal carotid diameter as the denominator. COMPARISON:  None. FINDINGS: MRI HEAD FINDINGS Brain: Small acute infarcts of the left paramedian frontal lobe and ventral medial left thalamus. Multiple smaller foci of acute ischemia within the left anterior cerebral artery distribution. No acute hemorrhage. No chronic microhemorrhage. 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 There is multifocal hyperintense T2-weighted signal within the  white matter. Parenchymal volume and CSF spaces are normal. The midline structures are normal. Vascular: Major flow voids are preserved. Skull and upper cervical spine: Normal calvarium and skull base. Visualized upper cervical spine and soft tissues are normal. Sinuses/Orbits:No paranasal sinus fluid levels or advanced mucosal thickening. No mastoid or middle ear effusion. Normal orbits. MRA HEAD FINDINGS POSTERIOR CIRCULATION: --Vertebral arteries: Normal --Inferior cerebellar arteries: Normal. --Basilar artery: Normal. --Superior cerebellar arteries: Normal. --Posterior cerebral arteries: Normal. ANTERIOR CIRCULATION: --Intracranial internal carotid arteries: Normal. --Anterior cerebral arteries (ACA): There is occlusion of a short segment of the left ACA A2 segment. There is moderate narrowing of the right ACA proximal pericallosal portion. --Middle cerebral arteries (MCA): Normal. ANATOMIC VARIANTS: None MRA NECK FINDINGS Motion degraded time-of-flight imaging of the carotid and vertebral arteries is unremarkable. There is no visible stenosis or occlusion. IMPRESSION: 1. Small acute infarcts of the left paramedian frontal lobe and ventromedial left thalamus. No hemorrhage or mass effect. 2. 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 3. Moderate narrowing of the right ACA proximal A3 segment. 4. Motion degraded time-of-flight MRA of the neck without visible abnormality. Electronically Signed   By: Deatra RobinsonKevin  Herman M.D.   On: 10/06/2020 03:37   MR ANGIO NECK WO CONTRAST  Result Date: 10/06/2020 CLINICAL DATA:  Seizure, slurred speech and double vision. EXAM: MRI HEAD WITHOUT CONTRAST MRA HEAD WITHOUT CONTRAST MRA NECK WITHOUT CONTRAST TECHNIQUE: Multiplanar, multiecho pulse sequences of the brain and surrounding structures were obtained without intravenous contrast. Angiographic images of the Circle of Willis were obtained using MRA technique without intravenous contrast. Angiographic images of the neck  were obtained using MRA technique without intravenous contrast. Carotid stenosis measurements (when applicable) are obtained utilizing NASCET criteria, using the distal internal carotid diameter as the denominator. COMPARISON:  None. FINDINGS: MRI HEAD FINDINGS Brain: Small acute infarcts of the left paramedian frontal lobe and ventral medial left thalamus. Multiple smaller foci of acute ischemia within the left anterior cerebral artery distribution. No acute hemorrhage. No chronic microhemorrhage. 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 There is multifocal hyperintense T2-weighted signal within the white matter. Parenchymal volume and CSF spaces are normal. The midline structures are normal. Vascular: Major flow voids are preserved. Skull and upper cervical spine: Normal calvarium and skull base. Visualized upper cervical spine  and soft tissues are normal. Sinuses/Orbits:No paranasal sinus fluid levels or advanced mucosal thickening. No mastoid or middle ear effusion. Normal orbits. MRA HEAD FINDINGS POSTERIOR CIRCULATION: --Vertebral arteries: Normal --Inferior cerebellar arteries: Normal. --Basilar artery: Normal. --Superior cerebellar arteries: Normal. --Posterior cerebral arteries: Normal. ANTERIOR CIRCULATION: --Intracranial internal carotid arteries: Normal. --Anterior cerebral arteries (ACA): There is occlusion of a short segment of the left ACA A2 segment. There is moderate narrowing of the right ACA proximal pericallosal portion. --Middle cerebral arteries (MCA): Normal. ANATOMIC VARIANTS: None MRA NECK FINDINGS Motion degraded time-of-flight imaging of the carotid and vertebral arteries is unremarkable. There is no visible stenosis or occlusion. IMPRESSION: 1. Small acute infarcts of the left paramedian frontal lobe and ventromedial left thalamus. No hemorrhage or mass effect. 2. 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 3. Moderate narrowing of the right ACA proximal A3 segment. 4.  Motion degraded time-of-flight MRA of the neck without visible abnormality. Electronically Signed   By: Deatra Robinson M.D.   On: 10/06/2020 03:37   MR BRAIN WO CONTRAST  Result Date: 10/09/2020 CLINICAL DATA:  Neuro deficit, acute, stroke suspected EXAM: MRI HEAD WITHOUT CONTRAST TECHNIQUE: Multiplanar, multiecho pulse sequences of the brain and surrounding structures were obtained without intravenous contrast. COMPARISON:  10/06/2020 FINDINGS: Brain: Multifocal acute ischemia in the left hemisphere and left brainstem. Affected areas include the paramedian left frontal lobe ventral medial left thalamus, left cerebral peduncle and left midbrain. The left cerebral peduncle lesion is clearly new. Old left cerebellar infarcts. Old right infarct. There is multifocal hyperintense T2-weighted signal within the white matter. Generalized volume loss without a clear lobar predilection. The midline structures are normal. Vascular: Major flow voids are preserved. Skull and upper cervical spine: Normal calvarium and skull base. Visualized upper cervical spine and soft tissues are normal. Sinuses/Orbits:No paranasal sinus fluid levels or advanced mucosal thickening. No mastoid or middle ear effusion. Normal orbits. IMPRESSION: 1. Multifocal acute ischemia in the left hemisphere and left brainstem, slightly worsened since 10/06/2020. 2. No hemorrhage or mass effect. Electronically Signed   By: Deatra Robinson M.D.   On: 10/09/2020 23:01   MR BRAIN WO CONTRAST  Result Date: 10/06/2020 CLINICAL DATA:  Seizure, slurred speech and double vision. EXAM: MRI HEAD WITHOUT CONTRAST MRA HEAD WITHOUT CONTRAST MRA NECK WITHOUT CONTRAST TECHNIQUE: Multiplanar, multiecho pulse sequences of the brain and surrounding structures were obtained without intravenous contrast. Angiographic images of the Circle of Willis were obtained using MRA technique without intravenous contrast. Angiographic images of the neck were obtained using MRA  technique without intravenous contrast. Carotid stenosis measurements (when applicable) are obtained utilizing NASCET criteria, using the distal internal carotid diameter as the denominator. COMPARISON:  None. FINDINGS: MRI HEAD FINDINGS Brain: Small acute infarcts of the left paramedian frontal lobe and ventral medial left thalamus. Multiple smaller foci of acute ischemia within the left anterior cerebral artery distribution. No acute hemorrhage. No chronic microhemorrhage. 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 There is multifocal hyperintense T2-weighted signal within the white matter. Parenchymal volume and CSF spaces are normal. The midline structures are normal. Vascular: Major flow voids are preserved. Skull and upper cervical spine: Normal calvarium and skull base. Visualized upper cervical spine and soft tissues are normal. Sinuses/Orbits:No paranasal sinus fluid levels or advanced mucosal thickening. No mastoid or middle ear effusion. Normal orbits. MRA HEAD FINDINGS POSTERIOR CIRCULATION: --Vertebral arteries: Normal --Inferior cerebellar arteries: Normal. --Basilar artery: Normal. --Superior cerebellar arteries: Normal. --Posterior cerebral arteries: Normal. ANTERIOR CIRCULATION: --Intracranial internal carotid  arteries: Normal. --Anterior cerebral arteries (ACA): There is occlusion of a short segment of the left ACA A2 segment. There is moderate narrowing of the right ACA proximal pericallosal portion. --Middle cerebral arteries (MCA): Normal. ANATOMIC VARIANTS: None MRA NECK FINDINGS Motion degraded time-of-flight imaging of the carotid and vertebral arteries is unremarkable. There is no visible stenosis or occlusion. IMPRESSION: 1. Small acute infarcts of the left paramedian frontal lobe and ventromedial left thalamus. No hemorrhage or mass effect. 2. 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 3. Moderate narrowing of the right ACA proximal A3 segment. 4. Motion degraded  time-of-flight MRA of the neck without visible abnormality. Electronically Signed   By: Deatra Robinson M.D.   On: 10/06/2020 03:37   EEG adult  Result Date: 10/10/2020 Jefferson Fuel, MD     10/10/2020  3:49 PM Routine EEG Report Raymond Kerr is a 65 y.o. male with a history of encephalopathy and aphasia in the setting of recent multifocal ischemic infarcts who is undergoing an EEG to evaluate for seizures. Report: This EEG was acquired with electrodes placed according to the International 10-20 electrode system (including Fp1, Fp2, F3, F4, C3, C4, P3, P4, O1, O2, T3, T4, T5, T6, A1, A2, Fz, Cz, Pz). The following electrodes were missing or displaced: none. The occipital dominant rhythm was 6-7 Hz. This activity is reactive to stimulation. Drowsiness was manifested by background fragmentation; deeper stages of sleep were manifested by K complexes and sleep spindles. There was focal slowing over the left frontal region. There were no interictal epileptiform discharges. There were no electrographic seizures identified. There was no abnormal response to photic stimulation. Hyperventilation was not performed. Impression and clinical correlation: This EEG was obtained while awake and asleep and is abnormal due to: - Moderate diffuse slowing indicating global cerebral dysfunction - Focal slowing over the left frontal region indicating focal dysfunction in that area consistent with known acute infarct There were no electrographic seizures or other epileptiform abnormalities observed during this recording. Bing Neighbors, MD [facility] [phone] If 7pm- 7am, please page neurology on call as listed in AMION.   ECHOCARDIOGRAM COMPLETE  Result Date: 10/06/2020    ECHOCARDIOGRAM REPORT   Patient Name:   FODAY CONE Date of Exam: 10/06/2020 Medical Rec #:  [id number]     Height:       72.0 in Accession #:    [id number]    Weight:       262.0 lb Date of Birth:  1955/09/23    BSA:          2.390 m Patient  Age:    64 years      BP:           119/78 mmHg Patient Gender: M             HR:           71 bpm. Exam Location:  [facility] Procedure: 2D Echo, Color Doppler and Cardiac Doppler Indications:     TIA G45.9  History:         Patient has no prior history of Echocardiogram examinations.                  Risk Factors:Diabetes and Hypertension. GERD.  Sonographer:     Cristela Blue Referring Phys:  [id number] JAN A MANSY Diagnosing Phys: Arnoldo Hooker MD  Sonographer Comments: Technically challenging study due to limited acoustic windows. The only view obtainable was parasternal. IMPRESSIONS  1. Left ventricular ejection fraction,  by estimation, is 40 to 45%. The left ventricle has mildly decreased function. The left ventricle demonstrates regional wall motion abnormalities (see scoring diagram/findings for description). Left ventricular diastolic parameters were normal.  2. Right ventricular systolic function is normal. The right ventricular size is normal.  3. The mitral valve is normal in structure. Mild mitral valve regurgitation.  4. The aortic valve is normal in structure. Aortic valve regurgitation is not visualized. FINDINGS  Left Ventricle: Left ventricular ejection fraction, by estimation, is 40 to 45%. The left ventricle has mildly decreased function. The left ventricle demonstrates regional wall motion abnormalities. Moderate hypokinesis of the left ventricular, mid-apical anteroseptal wall. The left ventricular internal cavity size was normal in size. There is no left ventricular hypertrophy. Left ventricular diastolic parameters were normal. Right Ventricle: The right ventricular size is normal. No increase in right ventricular wall thickness. Right ventricular systolic function is normal. Left Atrium: Left atrial size was normal in size. Right Atrium: Right atrial size was normal in size. Pericardium: There is no evidence of pericardial effusion. Mitral Valve: The mitral valve is normal in structure. Mild mitral  valve regurgitation. Tricuspid Valve: The tricuspid valve is normal in structure. Tricuspid valve regurgitation is mild. Aortic Valve: The aortic valve is normal in structure. Aortic valve regurgitation is not visualized. Pulmonic Valve: The pulmonic valve was normal in structure. Pulmonic valve regurgitation is not visualized. Aorta: The aortic root and ascending aorta are structurally normal, with no evidence of dilitation. IAS/Shunts: The interatrial septum was not assessed.  LEFT VENTRICLE PLAX 2D LVIDd:         4.30 cm LVIDs:         3.40 cm LV PW:         1.10 cm LV IVS:        1.20 cm LVOT diam:     2.00 cm LVOT Area:     3.14 cm  LEFT ATRIUM         Index LA diam:    2.90 cm 1.21 cm/m   AORTA Ao Root diam: 3.60 cm  SHUNTS Systemic Diam: 2.00 cm Arnoldo Hooker MD Electronically signed by Arnoldo Hooker MD Signature Date/Time: 10/06/2020/12:44:47 PM    Final    ECHO TEE  Result Date: 10/08/2020    TRANSESOPHOGEAL ECHO REPORT   Patient Name:   Raymond Kerr Date of Exam: 10/08/2020 Medical Rec #:  [id number]     Height:       72.0 in Accession #:    [id number]    Weight:       265.4 lb Date of Birth:  07/15/1955    BSA:          2.403 m Patient Age:    64 years      BP:           105/55 mmHg Patient Gender: M             HR:           85 bpm. Exam Location:  [facility] Procedure: Transesophageal Echo, Cardiac Doppler, Color Doppler and Saline            Contrast Bubble Study Indications:     Not listed on TEE check-in sheet  History:         Patient has prior history of Echocardiogram examinations, most                  recent 10/06/2020. Risk Factors:Diabetes and Hypertension.  Sonographer:  Cristela Blue Referring Phys:  [id number] Lamar Blinks Diagnosing Phys: Arnoldo Hooker MD PROCEDURE: The transesophogeal probe was passed without difficulty through the esophogus of the patient. Sedation performed by performing physician. The patient developed no complications during the procedure. IMPRESSIONS  1. Left  ventricular ejection fraction, by estimation, is 50 to 55%. The left ventricle has low normal function.  2. Right ventricular systolic function is normal. The right ventricular size is normal.  3. No left atrial/left atrial appendage thrombus was detected.  4. The mitral valve is normal in structure. Trivial mitral valve regurgitation.  5. The aortic valve is normal in structure. Aortic valve regurgitation is not visualized.  6. There is mild (Grade II) layered and protruding plaque.  7. Agitated saline contrast bubble study was negative, with no evidence of any interatrial shunt. FINDINGS  Left Ventricle: Left ventricular ejection fraction, by estimation, is 50 to 55%. The left ventricle has low normal function. The left ventricular internal cavity size was normal in size. Right Ventricle: The right ventricular size is normal. No increase in right ventricular wall thickness. Right ventricular systolic function is normal. Left Atrium: Left atrial size was normal in size. No left atrial/left atrial appendage thrombus was detected. Right Atrium: Right atrial size was normal in size. Pericardium: There is no evidence of pericardial effusion. Mitral Valve: The mitral valve is normal in structure. Trivial mitral valve regurgitation. Tricuspid Valve: The tricuspid valve is normal in structure. Tricuspid valve regurgitation is trivial. Aortic Valve: The aortic valve is normal in structure. Aortic valve regurgitation is not visualized. Pulmonic Valve: The pulmonic valve was normal in structure. Pulmonic valve regurgitation is trivial. Aorta: The aortic root and ascending aorta are structurally normal, with no evidence of dilitation. There is mild (Grade II) layered and protruding plaque. IAS/Shunts: No atrial level shunt detected by color flow Doppler. Agitated saline contrast was given intravenously to evaluate for intracardiac shunting. Agitated saline contrast bubble study was negative, with no evidence of any  interatrial shunt. Arnoldo Hooker MD Electronically signed by Arnoldo Hooker MD Signature Date/Time: 10/08/2020/5:07:15 PM    Final    CT ANGIO HEAD NECK W WO CM W PERF (CODE STROKE)  Result Date: 10/09/2020 CLINICAL DATA:  Right-sided paralysis EXAM: CT ANGIOGRAPHY HEAD AND NECK CT PERFUSION BRAIN TECHNIQUE: Multidetector CT imaging of the head and neck was performed using the standard protocol during bolus administration of intravenous contrast. Multiplanar CT image reconstructions and MIPs were obtained to evaluate the vascular anatomy. Carotid stenosis measurements (when applicable) are obtained utilizing NASCET criteria, using the distal internal carotid diameter as the denominator. Multiphase CT imaging of the brain was performed following IV bolus contrast injection. Subsequent parametric perfusion maps were calculated using RAPID software. CONTRAST:  OMNIPAQUE IOHEXOL 350 MG/ML SOLN COMPARISON:  None. FINDINGS: CT HEAD FINDINGS Brain: No acute intracranial hemorrhage or mass effect. Areas of infarction on the prior MRI are identified with involvement of the left thalamus slightly extending into the midbrain and left frontal lobe ACA territory. No definite acute appearing loss of gray-white differentiation. 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 No hydrocephalus. Vascular: No hyperdense vessel. Skull: No new findings. Sinuses/Orbits: No new findings. Other: None. Review of the MIP images confirms the above findings CTA NECK FINDINGS Aortic arch: Great vessel origins are patent. Right carotid system: Patent. No hemodynamically significant stenosis. Left carotid system: Patent. No hemodynamically significant stenosis. Vertebral arteries: Patent. Calcified plaque at the left vertebral origin. No significant stenosis.  Skeleton: Cervical spine degenerative changes. There is marked canal stenosis at C6-C7. Other neck: Unremarkable. Upper chest: Included upper lungs  are clear. Review of the MIP images confirms the above findings CTA HEAD FINDINGS Anterior circulation: Intracranial internal carotid arteries are patent with calcified plaque causing mild stenosis. Middle cerebral arteries are patent. 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 Stenosis of the proximal pericallosal right A3 ACA is unchanged. Posterior circulation: Intracranial internal carotid arteries are patent. Basilar artery is patent. Major cerebellar artery origins are patent. Bilateral posterior communicating arteries are present. Posterior cerebral arteries are patent. Venous sinuses: As permitted by contrast timing, patent. Review of the MIP images confirms the above findings CT Brain Perfusion Findings: CBF (<30%) Volume: 0mL Perfusion (Tmax>6.0s) volume: 4mL Mismatch Volume: 4mL Infarction Location: Area of territory at risk is within the distal left ACA territory IMPRESSION: No acute intracranial hemorrhage. Evolving areas of recent infarction are identified in the left thalamus and left ACA territory. No definite acute infarct. No new large vessel occlusion. 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 Perfusion imaging demonstrates 4 mL of penumbra within the distal left ACA territory. Initial results were provided by telephone at the time of interpretation on 10/09/2020 at 9:34 am to provider [facility] , who verbally acknowledged these results. Electronically Signed   By: Guadlupe Spanish M.D.   On: 10/09/2020 09:46    Microbiology: No results found for this or any previous visit (from the past 240 hour(s)).   Labs: Basic Metabolic Panel: Recent Labs  Lab 10/18/20 0828 10/19/20 0430 10/20/20 0714 10/23/20 0453  NA 136 134* 133* 136  K 3.6 3.7 3.7 3.8  CL 105 103 103 107  CO2 GLUCOSE 121* 173* 122* 163*  BUN CREATININE 1.00  0.98 1.03 1.00  CALCIUM 8.8* 9.0 8.8* 9.1  MG 1.9 1.7 1.7  --   PHOS 3.0 2.6 2.6  --    Liver Function Tests: No results for input(s): AST, ALT, ALKPHOS, BILITOT, PROT, ALBUMIN in the last 168 hours. No results for input(s): LIPASE, AMYLASE in the last 168 hours. No results for input(s): AMMONIA in the last 168 hours. CBC: Recent Labs  Lab 10/21/20 0450  WBC 5.3  HGB 13.3  HCT 39.9  MCV 88.7  PLT 296   Cardiac Enzymes: No results for input(s): CKTOTAL, CKMB, CKMBINDEX, TROPONINI in the last 168 hours. BNP: BNP (last 3 results) No results for input(s): BNP in the last 8760 hours.  ProBNP (last 3 results) No results for input(s): PROBNP in the last 8760 hours.  CBG: Recent Labs  Lab 10/23/20 1730 10/23/20 1958 10/24/20 0019 10/24/20 0523 10/24/20 0745  GLUCAP 195* 152* 151* 146* 131*       Signed:  Albertine Grates MD, PhD, FACP  [facility] 10/24/2020, 10:31 AM

## 2020-10-24 NOTE — Progress Notes (Signed)
This nurse notified by family of wish to not allow a Ms. [name]  to receive any information on chart, or to be allowed to visit patient. Patient family expressed wish to convert chart to confidential patient  status. Hospital admitting department notified to change status. Patient safety password also set up as the following "[id number]" see flowsheet

## 2020-10-24 NOTE — Progress Notes (Signed)
RT NOTE:  Pts family states patient does not wear CPAP at home. He has not had a sleep study or any diagnosis of OSA. Family member did state that MD mentioned a CPAP might make him sleep better. Without a sleep study we are unable to monitor appropriate settings or mask fit to see if patient actually will benefit from a CPAP. Family made decision to not put patient on until MD is spoken to in AM.

## 2020-10-24 NOTE — TOC Progression Note (Signed)
Transition of Care [facility]) - Progression Note    Patient Details  Name: Raymond Kerr MRN: [id number] Date of Birth: Dec 28, 1955  [facility]) CM/SW Contact  Allayne Butcher, RN Phone Number: 10/24/2020, 9:44 AM  Clinical Narrative:    [employer] is helping patient get signed up for Medicare.  RNCM was able to call [facility] and they do not have any insurance on file for him he is a self pay patient.  CIR will attempt to get patient admitted today since Medicare is being worked on.     Expected Discharge Plan: IP Rehab Facility Barriers to Discharge: Continued Medical Work up  Expected Discharge Plan and Services Expected Discharge Plan: IP Rehab Facility   Discharge Planning Services: CM Consult Post Acute Care Choice: IP Rehab Living arrangements for the past 2 months: No permanent address [employer] worker)                 DME Arranged: N/A DME Agency: NA       HH Arranged: NA HH Agency: NA         Social Determinants of Health (SDOH) Interventions    Readmission Risk Interventions No flowsheet data found.

## 2020-10-24 NOTE — Progress Notes (Signed)
Chaplain called and states that he cannot assist with HCPOA for patient because this process is patient driven.  Daughter and Chaplain have already spoken with this process and that next of kin would be decision maker in this situation as patient cannot make HCPOA request r/t speech issues.

## 2020-10-24 NOTE — Progress Notes (Signed)
Inpatient Rehabilitation Medication Review by a Pharmacist  A complete drug regimen review was completed for this patient to identify any potential clinically significant medication issues.  High Risk Drug Classes Is patient taking? Indication by Medication  Antipsychotic No   Anticoagulant Yes DVT prophylaxis  Antibiotic No   Opioid No   Antiplatelet Yes severe intracranial stenosis   Hypoglycemics/insulin Yes Diabetes   Vasoactive Medication No   Chemotherapy No   Other No      Type of Medication Issue Identified Description of Issue Recommendation(s)  Drug Interaction(s) (clinically significant)     Duplicate Therapy     Allergy     No Medication Administration End Date     Incorrect Dose     Additional Drug Therapy Needed     Significant med changes from prior encounter (inform family/care partners about these prior to discharge).    Other       Clinically significant medication issues were identified that warrant physician communication and completion of prescribed/recommended actions by midnight of the next day:  No  Name of provider notified for urgent issues identified:   Provider Method of Notification:     Pharmacist comments:   Time spent performing this drug regimen review (minutes):  25   Raymond Kerr 10/24/2020 5:13 PM

## 2020-10-24 NOTE — Progress Notes (Signed)
Inpatient Rehab Admissions Coordinator:   Working to determine coverage so we can seek prior authorization for CIR if necessary.    Estill Dooms, PT, DPT Admissions Coordinator [phone] 10/24/20  8:56 AM

## 2020-10-25 DIAGNOSIS — I639 Cerebral infarction, unspecified: Secondary | ICD-10-CM

## 2020-10-25 LAB — GLUCOSE, CAPILLARY
Glucose-Capillary: 188 mg/dL — ABNORMAL HIGH (ref 70–99)
Glucose-Capillary: 152 mg/dL — ABNORMAL HIGH (ref 70–99)
Glucose-Capillary: 143 mg/dL — ABNORMAL HIGH (ref 70–99)
Glucose-Capillary: 150 mg/dL — ABNORMAL HIGH (ref 70–99)

## 2020-10-25 MED ORDER — POTASSIUM CHLORIDE 20 MEQ PO PACK
20.0000 meq | PACK | Freq: Once | ORAL | Status: AC
  Administered 2020-10-25: 20 meq via ORAL
  Filled 2020-10-25: qty 1

## 2020-10-25 MED ORDER — METFORMIN HCL 500 MG PO TABS
250.0000 mg | ORAL_TABLET | Freq: Every day | ORAL | Status: DC
  Administered 2020-10-26 – 2020-10-28 (×3): 250 mg via ORAL
  Filled 2020-10-25 (×3): qty 1

## 2020-10-25 NOTE — Plan of Care (Signed)
  Problem: RH Balance Goal: LTG Patient will maintain dynamic sitting balance (PT) Description: LTG:  Patient will maintain dynamic sitting balance with assistance during mobility activities (PT) Flowsheets (Taken 10/25/2020 1809) LTG: Pt will maintain dynamic sitting balance during mobility activities with:: Supervision/Verbal cueing   Problem: Sit to Stand Goal: LTG:  Patient will perform sit to stand with assistance level (PT) Description: LTG:  Patient will perform sit to stand with assistance level (PT) Flowsheets (Taken 10/25/2020 1809) LTG: PT will perform sit to stand in preparation for functional mobility with assistance level: Moderate Assistance - Patient 50 - 74%   Problem: RH Bed Mobility Goal: LTG Patient will perform bed mobility with assist (PT) Description: LTG: Patient will perform bed mobility with assistance, with/without cues (PT). Flowsheets (Taken 10/25/2020 1809) LTG: Pt will perform bed mobility with assistance level of: Moderate Assistance - Patient 50 - 74%   Problem: RH Bed to Chair Transfers Goal: LTG Patient will perform bed/chair transfers w/assist (PT) Description: LTG: Patient will perform bed to chair transfers with assistance (PT). Flowsheets (Taken 10/25/2020 1809) LTG: Pt will perform Bed to Chair Transfers with assistance level: Moderate Assistance - Patient 50 - 74%   Problem: RH Car Transfers Goal: LTG Patient will perform car transfers with assist (PT) Description: LTG: Patient will perform car transfers with assistance (PT). Flowsheets (Taken 10/25/2020 1809) LTG: Pt will perform car transfers with assist:: Moderate Assistance - Patient 50 - 74%   Problem: RH Ambulation Goal: LTG Patient will ambulate in controlled environment (PT) Description: LTG: Patient will ambulate in a controlled environment, # of feet with assistance (PT). Flowsheets (Taken 10/25/2020 1809) LTG: Pt will ambulate in controlled environ  assist needed:: Maximal Assistance - Patient  25 - 49% LTG: Ambulation distance in controlled environment: 53ft with LRAD   Problem: RH Wheelchair Mobility Goal: LTG Patient will propel w/c in controlled environment (PT) Description: LTG: Patient will propel wheelchair in controlled environment, # of feet with assist (PT) Flowsheets (Taken 10/25/2020 1809) LTG: Pt will propel w/c in controlled environ  assist needed:: Minimal Assistance - Patient > 75% LTG: Propel w/c distance in controlled environment: 139ft with hemi technique Goal: LTG Patient will propel w/c in home environment (PT) Description: LTG: Patient will propel wheelchair in home environment, # of feet with assistance (PT). Flowsheets (Taken 10/25/2020 1809) LTG: Pt will propel w/c in home environ  assist needed:: Minimal Assistance - Patient > 75% LTG: Propel w/c distance in home environment: 72ft with hemi technique

## 2020-10-25 NOTE — Evaluation (Signed)
Physical Therapy Assessment and Plan  Patient Details  Name: Raymond Kerr MRN: [id number] Date of Birth: Sep 28, 1955  PT Diagnosis: Abnormal posture, Abnormality of gait, Coordination disorder, Hemiparesis dominant, Hypertonia, Impaired cognition, Impaired sensation, Muscle spasms, and Muscle weakness Rehab Potential: Fair ELOS: 31-34 days   Today's Date: 10/25/2020 PT Individual Time: 7782-4235 PT Individual Time Calculation (min): 68 min    Hospital Problem: Principal Problem:   Left thalamic infarction [facility])   Past Medical History:  Past Medical History:  Diagnosis Date   Diabetes mellitus without complication ([facility])    GERD (gastroesophageal reflux disease)    Hypertension    Stroke [facility]    Past Surgical History:  Past Surgical History:  Procedure Laterality Date   TEE WITHOUT CARDIOVERSION N/A 10/08/2020   Procedure: TRANSESOPHAGEAL ECHOCARDIOGRAM (TEE);  Surgeon: Corey Skains, MD;  Location: [facility] ORS;  Service: Cardiovascular;  Laterality: N/A;    Assessment & Plan Clinical Impression: Patient is a  65 year old right-handed male with history of hypertension, diabetes mellitus as well as hyperlipidemia.  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  The home with a single-story home no steps to entry.  His daughter plans to provide assistance on discharge.  There are also 2 other sisters in the area.  Presented 10/06/2020 with dysarthria, diplopia, incoordination and right facial drop progressing over 2 days.  Cranial CT scan showed low-density area within the left thalamus and right frontal lobe compatible with subacute to chronic infarcts.  No intracranial hemorrhage.  MRI /MRA small acute infarcts of the left paramedian frontal lobe and ventral medial left thalamus.  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  Moderate narrowing of the right ACA proximal A3 segment.  Patient did not  receive tPA.  Echocardiogram with ejection fraction of 40 to 45% left ventricle demonstrated regional wall motion abnormality as well as some septal hypofunction.  Admission chemistries unremarkable aside glucose 260, alcohol negative, troponin 62-68, hemoglobin A1c 10.1, urine drug screen negative.  Cardiology services did follow-up to address elevated troponin.  EKG normal sinus rhythm with septal infarct age undetermined.  TEE completed 10/08/2020 ejection fraction 50 to 55% right ventricular systolic function was normal mild grade 2 layered and protruding plaque.  No atrial level shunt detected by color-flow Doppler.  Patient had been placed on dual antiplatelet therapy for CVA advised to continue and monitor no further cardiac diagnostics at this time.  Subcutaneous Lovenox for DVT prophylaxis.  Hospital course 3 days after his admission he developed increasing aphasia and right side weakness.  Repeat MRI revealed extension of both his left frontal and brainstem infarcts as well as new watershed infarct in the internal border zone on the left.  EEG was completed showing no seizure activity.  Neurology advised to continue aspirin and Plavix therapy x90 days given severe intracranial stenosis followed by aspirin 81 mg daily after that.  He is currently maintained on mechanical soft diet Limited p.o. intake concerns of aspiration pneumonia there was some discussion of possible need for PEG tube of which family adamantly at this time refused.  Therapy evaluations completed due to patient's aphasia and decreased functional mobility was admitted for a comprehensive rehab program. Patient transferred to CIR on 10/24/2020 .   Patient currently requires total with mobility secondary to muscle weakness, muscle joint tightness, and muscle paralysis, decreased cardiorespiratoy endurance, impaired timing and sequencing, abnormal tone, unbalanced muscle activation, motor apraxia,  and decreased motor planning, decreased visual  acuity, decreased visual perceptual skills, decreased visual motor skills, and field cut, decreased midline orientation, decreased attention to right, right side neglect, decreased motor planning, and ideational apraxia, decreased initiation, decreased attention, decreased awareness, decreased problem solving, decreased safety awareness, and delayed processing, and decreased sitting balance, decreased standing balance, decreased postural control, hemiplegia, decreased balance strategies, and difficulty maintaining precautions.  Prior to hospitalization, patient was independent  with mobility and lived with Daughter in a [place] home.  Home access is 1 platorm stepsStairs to enter.  Patient will benefit from skilled PT intervention to maximize safe functional mobility, minimize fall risk, and decrease caregiver burden for planned discharge home with 24 hour assist.  Anticipate patient will benefit from follow up [facility] or SNF placement at discharge.  PT - End of Session Activity Tolerance: Tolerates < 10 min activity, no significant change in vital signs Endurance Deficit: Yes PT Assessment Rehab Potential (ACUTE/IP ONLY): Fair PT Barriers to Discharge: [place] home environment;Decreased caregiver support;Home environment access/layout;Lack of/limited family support;Neurogenic Bowel & Bladder;Insurance for SNF coverage;Weight;Medication compliance PT Patient demonstrates impairments in the following area(s): Balance;Behavior;Edema;Endurance;Motor;Nutrition;Pain;Perception;Safety;Sensory;Skin Integrity PT Transfers Functional Problem(s): Bed Mobility;Bed to Chair;Car;Furniture;Floor PT Locomotion Functional Problem(s): Ambulation;Wheelchair Mobility;Stairs PT Plan PT Intensity: Minimum of 1-2 x/day ,45 to 90 minutes PT Frequency: 5 out of 7 days PT Duration Estimated Length of Stay: 31-34 days PT Treatment/Interventions: Ambulation/gait training;Balance/vestibular training;Cognitive  remediation/compensation;Disease management/prevention;Discharge planning;Community reintegration;DME/adaptive equipment instruction;Functional electrical stimulation;Functional mobility training;Patient/family education;Pain management;Neuromuscular re-education;Psychosocial support;Skin care/wound management;Splinting/orthotics;Therapeutic Exercise;Therapeutic Activities;Stair training;UE/LE Strength taining/ROM;UE/LE Coordination activities;Visual/perceptual remediation/compensation;Wheelchair propulsion/positioning PT Transfers Anticipated Outcome(s): Mod A with LRAD PT Locomotion Anticipated Outcome(s): WC level with min assist. Ambulatory with PT. PT Recommendation Follow Up Recommendations: Home health PT;Skilled nursing facility Patient destination: [facility] (SNF) Equipment Recommended: Wheelchair (measurements);Wheelchair cushion (measurements);To be determined Equipment Details: slide board   PT Evaluation Precautions/Restrictions   Fall General   Vital Signs  Pain Pain Assessment Pain Scale: Faces Faces Pain Scale: Hurts a little bit Pain Intervention(s): Repositioned Pain Interference Pain Interference Pain Effect on Sleep: 1. Rarely or not at all Pain Interference with Therapy Activities: 1. Rarely or not at all Pain Interference with Day-to-Day Activities: 2. Occasionally Home Living/Prior Functioning Home Living Available Help at Discharge: Other (Comment) Type of Home: Apartment Home Access: Stairs to enter Entrance Stairs-Number of Steps: 1 platorm steps Entrance Stairs-Rails: None Home Layout: One level Bathroom Shower/Tub:  (family not present, pt unable to verbalize) Bathroom Accessibility: Yes  Lives With: Daughter Prior Function Level of Independence: Independent with basic ADLs;Independent with gait;Independent with homemaking with ambulation  Able to Take Stairs?: Yes Driving: Yes Vocation: Full time employment Comments: Long haul truck  driver Vision/Perception  Vision - History Ability to See in Adequate Light: 3 Highly impaired Vision - Assessment Eye Alignment: Impaired (comment) Ocular Range of Motion: Impaired-to be further tested in functional context Alignment/Gaze Preference: Within Defined Limits Tracking/Visual Pursuits: Other (comment);Right eye does not track medially;Right eye does not track laterally Saccades: Impaired - to be further tested in functional context Perception Perception: Impaired Inattention/Neglect: Does not attend to right side of body;Does not attend to right visual field Praxis Praxis: Impaired Praxis Impairment Details: Initiation;Ideomotor;Ideation;Motor planning  Cognition Overall Cognitive Status: Impaired/Different from baseline Arousal/Alertness: Awake/alert Attention: Focused;Sustained Focused Attention: Impaired Focused Attention Impairment: Verbal basic;Functional basic Sustained Attention: Impaired Sustained Attention Impairment: Verbal basic;Functional basic Selective Attention: Impaired Selective Attention Impairment: Functional basic Memory:  (unable to assess 2' aphasia) Awareness: Impaired Awareness Impairment:  (  difficult to assess d/t aphasia) Problem Solving: Impaired Problem Solving Impairment: Functional basic Safety/Judgment: Impaired Sensation Sensation Light Touch: Impaired Detail Light Touch Impaired Details: Impaired RUE;Impaired RLE Hot/Cold: Not tested Proprioception: Not tested;Impaired Detail Proprioception Impaired Details: Impaired RLE;Impaired RUE Stereognosis: Not tested Coordination Gross Motor Movements are Fluid and Coordinated: No Fine Motor Movements are Fluid and Coordinated: No Finger Nose Finger Test: unable to follow directions for LUE, no active movement in RUE Heel Shin Test: no active movement in the RLE Motor  Motor Motor: Hemiplegia;Abnormal tone   Trunk/Postural Assessment  Cervical Assessment Cervical Assessment:  Exceptions to [facility] (limited ROM to the R) Thoracic Assessment Thoracic Assessment: Exceptions to [facility] Lumbar Assessment Lumbar Assessment: Exceptions to [facility] Postural Control Postural Control: Deficits on evaluation Trunk Control: mod impaired. Moderat R bias Righting Reactions: mod impaired  Balance Static Sitting Balance Static Sitting - Level of Assistance: 4: Min assist Dynamic Sitting Balance Dynamic Sitting - Level of Assistance: 3: Mod assist;2: Max assist Static Standing Balance Static Standing - Level of Assistance: 1: +2 Total assist (in stedy) Extremity Assessment      RLE Assessment RLE Assessment: Exceptions to [facility] General Strength Comments: 0/5 proximal to distal LLE Assessment LLE Assessment: Within Functional Limits General Strength Comments: grossly 4+/5 to 5/5 proximal to distal  Care Tool Care Tool Bed Mobility Roll left and right activity   Roll left and right assist level: 2 Helpers    Sit to lying activity   Sit to lying assist level: 2 Helpers    Lying to sitting on side of bed activity   Lying to sitting on side of bed assist level: the ability to move from lying on the back to sitting on the side of the bed with no back support.: 2 Helpers     Care Tool Transfers Sit to stand transfer   Sit to stand assist level: 2 Helpers    Chair/bed transfer   Chair/bed transfer assist level: Dependent - mechanical lift     Toilet transfer   Assist Level: 2 Production assistant, radio transfer activity did not occur: Safety/medical concerns        Care Tool Locomotion Ambulation Ambulation activity did not occur: Safety/medical concerns        Walk 10 feet activity Walk 10 feet activity did not occur: Safety/medical concerns       Walk 50 feet with 2 turns activity Walk 50 feet with 2 turns activity did not occur: Safety/medical concerns      Walk 150 feet activity Walk 150 feet activity did not occur: Safety/medical concerns      Walk 10  feet on uneven surfaces activity Walk 10 feet on uneven surfaces activity did not occur: Safety/medical concerns      Stairs Stair activity did not occur: Safety/medical concerns        Walk up/down 1 step activity Walk up/down 1 step or curb (drop down) activity did not occur: Safety/medical concerns     Walk up/down 4 steps activity did not occuR: Safety/medical concerns  Walk up/down 4 steps activity      Walk up/down 12 steps activity Walk up/down 12 steps activity did not occur: Safety/medical concerns      Pick up small objects from floor Pick up small object from the floor (from standing position) activity did not occur: Safety/medical concerns      Wheelchair     Wheelchair activity did not occur: Safety/medical concerns      Wheel  50 feet with 2 turns activity Wheelchair 50 feet with 2 turns activity did not occur: Safety/medical concerns    Wheel 150 feet activity Wheelchair 150 feet activity did not occur: Safety/medical concerns      Refer to Care Plan for Long Term Goals  SHORT TERM GOAL WEEK 1 PT Short Term Goal 1 (Week 1): Pt will perform bed mobility with max assist of 1. PT Short Term Goal 2 (Week 1): Pt will perform sit<>stand with total A of 1. PT Short Term Goal 3 (Week 1): Pt will initiate gait training PT Short Term Goal 4 (Week 1): Pt will tolerate sitting in WC >2 hours between therapies  Recommendations for other services: None   Skilled Therapeutic Intervention  Pt received supine in bed and agreeable to PT. Family feeding pt chinese food. PT required to suction pt due to pocketing on the R. Intermittent coughing suggesting near aspiration food provided by family.  PT instructed patient in PT Evaluation and initiated treatment intervention; see above for results. PT educated patient in [place], rehab potential, rehab goals, and discharge recommendations along with recommendation for follow-up rehabilitation services.  Supine>sit transfer with total A  and +2 for safety. Mild  assist and max cues for technique. Hip pain with increased tone limits ability to roll to the L. Sitting balance EOB with mod progressing to min assist with mild R lean. SB transfer with total A to [place] as listed below. Noted to have been incontinent. Sit<>stand in stady with +2 assist for clothing management to doff soiled clothes and don clean brief and gown. Pt returned to room and performed stedy transfer to bed with total +2 assist. Sit>supine completed with total +2 assist and left supine in bed with call bell in reach and all needs met.    Mobility Bed Mobility Bed Mobility: Rolling Right;Rolling Left;Supine to Sit;Sit to Supine Rolling Right: 2 Helpers Rolling Left: 2 Helpers Supine to Sit: 2 Helpers Sit to Supine: 2 Helpers Transfers Transfers: Sit to Stand;Lateral/Scoot Transfers Sit to Stand: Dependent - mechanical lift;2 Helpers Lateral/Scoot Transfers: Total Assistance - Patient < 25%;2 Helpers Locomotion  Gait Ambulation: No Stairs / Additional Locomotion Stairs: No Wheelchair Mobility Wheelchair Mobility: No   Discharge Criteria: Patient will be discharged from PT if patient refuses treatment 3 consecutive times without medical reason, if treatment goals not met, if there is a change in medical status, if patient makes no progress towards goals or if patient is discharged from hospital.  The above assessment, treatment plan, treatment alternatives and goals were discussed and mutually agreed upon: by patient and by family  Lorie Phenix 10/25/2020, 6:01 PM

## 2020-10-25 NOTE — Evaluation (Signed)
Occupational Therapy Assessment and Plan  Patient Details  Name: Raymond Kerr MRN: [id number] Date of Birth: 04/14/1955  OT Diagnosis: abnormal posture, apraxia, cognitive deficits, and hemiplegia affecting dominant side Rehab Potential: Rehab Potential (ACUTE ONLY): Fair ELOS: 28-30 days   Today's Date: 10/25/2020 OT Individual Time: 0800-0900 OT Individual Time Calculation (min): 60 min     Hospital Problem: Active Problems:   Left thalamic infarction [facility])   Past Medical History:  Past Medical History:  Diagnosis Date   Diabetes mellitus without complication ([place])    GERD (gastroesophageal reflux disease)    Hypertension    Stroke [facility])    Past Surgical History:  Past Surgical History:  Procedure Laterality Date   TEE WITHOUT CARDIOVERSION N/A 10/08/2020   Procedure: TRANSESOPHAGEAL ECHOCARDIOGRAM (TEE);  Surgeon: Corey Skains, MD;  Location: [facility] ORS;  Service: Cardiovascular;  Laterality: N/A;    Assessment & Plan Clinical Impression: .Raymond Kerr is a 65 year old right-handed male with history of hypertension, diabetes mellitus as well as hyperlipidemia. Presented 10/06/2020 with dysarthria, diplopia, incoordination and right facial drop progressing over 2 days.  Cranial CT scan showed low-density area within the left thalamus and right frontal lobe compatible with subacute to chronic infarcts.  No intracranial hemorrhage.  MRI /MRA small acute infarcts of the left paramedian frontal lobe and ventral medial left thalamus.  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  Moderate narrowing of the right ACA proximal A3 segment.  Patient did not receive tPA.  Echocardiogram with ejection fraction of 40 to 45% left ventricle demonstrated regional wall motion abnormality as well as some septal hypofunction.  Admission chemistries unremarkable aside glucose 260, alcohol negative, troponin 62-68, hemoglobin A1c 10.1, urine drug screen negative.  Cardiology services did  follow-up to address elevated troponin.  EKG normal sinus rhythm with septal infarct age undetermined.  TEE completed 10/08/2020 ejection fraction 50 to 55% right ventricular systolic function was normal mild grade 2 layered and protruding plaque.  No atrial level shunt detected by color-flow Doppler.  Patient had been placed on dual antiplatelet therapy for CVA advised to continue and monitor no further cardiac diagnostics at this time.  Subcutaneous Lovenox for DVT prophylaxis.  Hospital course 3 days after his admission he developed increasing aphasia and right side weakness.  Repeat MRI revealed extension of both his left frontal and brainstem infarcts as well as new watershed infarct in the internal border zone on the left.  EEG was completed showing no seizure activity.  Neurology advised to continue aspirin and Plavix therapy x90 days given severe intracranial stenosis followed by aspirin 81 mg daily after that.  He is currently maintained on mechanical soft diet Limited p.o. intake concerns of aspiration pneumonia there was some discussion of possible need for PEG tube of which family adamantly at this time refused.  Therapy evaluations completed due to patient's aphasia and decreased functional mobility was recommended for a comprehensive rehab program.  Patient transferred to CIR on 10/24/2020 .    Patient currently requires total with basic self-care skills secondary to abnormal tone, unbalanced muscle activation, and motor apraxia, decreased visual perceptual skills and decreased visual motor skills, decreased attention to right, decreased initiation, decreased attention, decreased awareness, decreased problem solving, decreased safety awareness, decreased memory, and delayed processing, and decreased sitting balance, decreased standing balance, decreased postural control, hemiplegia, and decreased balance strategies.  Prior to hospitalization, patient was fully independent.  Patient will benefit from  skilled intervention to increase independence with basic self-care skills  prior to discharge home with care partner.  Anticipate patient will require 24 hour supervision and minimal physical assistance and follow up home health.  OT - End of Session Activity Tolerance: Tolerates 10 - 20 min activity with multiple rests Endurance Deficit: Yes OT Assessment Rehab Potential (ACUTE ONLY): Fair OT Patient demonstrates impairments in the following area(s): Balance;Cognition;Endurance;Motor;Perception;Safety;Sensory;Vision OT Basic ADL's Functional Problem(s): Eating;Grooming;Bathing;Dressing;Toileting OT Transfers Functional Problem(s): Toilet;Tub/Shower OT Additional Impairment(s): Fuctional Use of Upper Extremity OT Plan OT Intensity: Minimum of 1-2 x/day, 45 to 90 minutes OT Frequency: 5 out of 7 days OT Duration/Estimated Length of Stay: 28-30 days OT Treatment/Interventions: Balance/vestibular training;Cognitive remediation/compensation;Discharge planning;Functional mobility training;DME/adaptive equipment instruction;Neuromuscular re-education;Patient/family education;Psychosocial support;Therapeutic Activities;Skin care/wound managment;Self Care/advanced ADL retraining;Therapeutic Exercise;UE/LE Strength taining/ROM;UE/LE Coordination activities;Visual/perceptual remediation/compensation OT Self Feeding Anticipated Outcome(s): supervision OT Basic Self-Care Anticipated Outcome(s): Min A OT Toileting Anticipated Outcome(s): Min A OT Bathroom Transfers Anticipated Outcome(s): Min A OT Recommendation Patient destination: Home Follow Up Recommendations: Home health OT Equipment Recommended: Tub/shower bench;3 in 1 bedside comode   OT Evaluation Precautions/Restrictions  Precautions Precautions: Fall Precaution Comments: R hemi Restrictions Weight Bearing Restrictions: No  Pain Pain Assessment Pain Scale: Faces Pain Score: 0-No pain Faces Pain Scale: No hurt Home Living/Prior  Functioning Home Living Available Help at Discharge: Family Type of Home: House Home Access: Level entry Home Layout: One level Bathroom Shower/Tub:  (family not present, pt unable to verbalize) Additional Comments: 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 The home is a single-story home without steps to enter. Pt's friend indicates his daugther is disabled and unable to physically assist with any mobility.  Lives With: Daughter IADL History Education: HS Prior Function Level of Independence: Independent with basic ADLs, Independent with gait  Able to Take Stairs?: Yes Driving: Yes Vocation: Full time employment Comments: Pt is a independent with ADLs, IADLs and intermittently unloads trunks but mainly drives. Vision Baseline Vision/History: 0 No visual deficits Ability to See in Adequate Light: 3 Highly impaired Patient Visual Report: Other (comment) (pt unable to express, L eye ptosis, when manually lifted L eyelid eye could not track) Vision Assessment?: Yes Eye Alignment: Impaired (comment) (L eyelid ptosis, keeps L eye closed) Tracking/Visual Pursuits: Other (comment);Right eye does not track medially;Right eye does not track laterally (L eye stays closed) Visual Fields:  (unable to assess due to pt being nonverbal) Perception  Perception: Impaired Inattention/Neglect: Does not attend to right side of body;Does not attend to right visual field Praxis Praxis: Impaired Praxis Impairment Details: Initiation;Ideomotor;Ideation;Motor planning Praxis-Other Comments: pt could not follow directions to lean forward, to shift wt Cognition Overall Cognitive Status: Impaired/Different from baseline Arousal/Alertness: Awake/alert Orientation Level: Person;Nonverbal/unable to assess Year:  (non verbal) Month:  (non verbal) Day of Week:  (non verbal) Memory:  (unable to assess 2' aphasia) Immediate Memory  Recall:  (non verbal) Attention: Focused;Sustained Focused Attention: Impaired Focused Attention Impairment: Verbal basic;Functional basic Sustained Attention: Impaired Sustained Attention Impairment: Verbal basic;Functional basic Selective Attention: Impaired Selective Attention Impairment: Functional basic Awareness: Impaired Awareness Impairment:  (difficult to assess d/t aphasia) Problem Solving: Impaired Problem Solving Impairment: Functional basic Safety/Judgment: Impaired Sensation Sensation Light Touch: Appears Intact Hot/Cold: Not tested Proprioception: Not tested Stereognosis: Not tested Coordination Gross Motor Movements are Fluid and Coordinated: No Fine Motor Movements are Fluid and Coordinated: No Finger Nose Finger Test: unable to follow directions for LUE, no active movement in RUE Motor  Motor Motor: Hemiplegia;Abnormal  tone  Trunk/Postural Assessment  Postural Control Postural Control: Deficits on evaluation Trunk Control: mod impaired Righting Reactions: mod impaired  Balance Static Sitting Balance Static Sitting - Level of Assistance: 5: Stand by assistance Dynamic Sitting Balance Dynamic Sitting - Level of Assistance: 3: Mod assist Static Standing Balance Static Standing - Level of Assistance: Not tested (comment) (could not get pt to participate in standing) Extremity/Trunk Assessment RUE Assessment RUE Assessment: Exceptions to [facility] Active Range of Motion (AROM) Comments: 0 AROM RUE Body System: Neuro Brunstrum levels for arm and hand: Arm;Hand Brunstrum level for arm: Stage I Presynergy Brunstrum level for hand: Stage I Flaccidity RUE Tone RUE Tone: Moderate;Hypertonic Hypertonic Details: in shoulder and elbow, hand did not have tone LUE Assessment LUE Assessment: Within Functional Limits  Care Tool Care Tool Self Care Eating Eating activity did not occur: Refused (pt kept shaking head no that he did not want his breakfast)      Oral  Care  Oral care, brush teeth, clean dentures activity did not occur: Refused      Bathing   Body parts bathed by patient: Chest;Abdomen;Front perineal area;Left upper leg;Face Body parts bathed by helper: Right arm;Left arm;Buttocks;Right upper leg;Right lower leg;Left lower leg   Assist Level: Maximal Assistance - Patient 24 - 49%    Upper Body Dressing(including orthotics)   What is the patient wearing?: Pull over shirt   Assist Level: Maximal Assistance - Patient 25 - 49%    Lower Body Dressing (excluding footwear)   What is the patient wearing?: Incontinence brief;Pants Assist for lower body dressing: Total Assistance - Patient < 25%    Putting on/Taking off footwear   What is the patient wearing?: Ted hose;Non-skid slipper socks Assist for footwear: Total Assistance - Patient < 25%       Care Tool Toileting Toileting activity   Assist for toileting: 2 Helpers     Care Tool Bed Mobility Roll left and right activity   Roll left and right assist level: Maximal Assistance - Patient 25 - 49%    Sit to lying activity   Sit to lying assist level: 2 Helpers    Lying to sitting on side of bed activity   Lying to sitting on side of bed assist level: the ability to move from lying on the back to sitting on the side of the bed with no back support.: 2 Helpers     Care Tool Transfers Sit to stand transfer Sit to stand activity did not occur: Refused      Chair/bed transfer Chair/bed transfer activity did not occur: Safety/medical concerns       Toilet transfer Toilet transfer activity did not occur: Safety/medical concerns       Care Tool Cognition  Expression of Ideas and Wants Expression of Ideas and Wants: 1. Rarely/Never expressess or very difficult - rarely/never expresses self or speech is very difficult to understand  Understanding Verbal and Non-Verbal Content Understanding Verbal and Non-Verbal Content: 2. Sometimes understands - understands only basic  conversations or simple, direct phrases. Frequently requires cues to understand   Memory/Recall Ability Memory/Recall Ability : That he or she is in a hospital/hospital unit   Refer to Care Plan for Long Term Goals  SHORT TERM GOAL WEEK 1 OT Short Term Goal 1 (Week 1): Pt will demonstrate improved initiation with leaning forward and using left hand on bar of stedy to move to stand with max A of 1. OT Short Term Goal 2 (Week 1): Pt will  demonstrate improved sit balance to sit EOB with min A while bathing UB with min a. OT Short Term Goal 3 (Week 1): Pt will engage in RUE self ROM with mod A. OT Short Term Goal 4 (Week 1): Pt will don tshirt with mod A.  Recommendations for other services: None    Skilled Therapeutic Intervention ADL ADL Upper Body Bathing: Moderate assistance Where Assessed-Upper Body Bathing: Bed level Lower Body Bathing: Dependent Where Assessed-Lower Body Bathing: Bed level Upper Body Dressing: Maximal assistance Where Assessed-Upper Body Dressing: Edge of bed Lower Body Dressing: Dependent Where Assessed-Lower Body Dressing: Bed level   Pt seen for initial evaluation and ADL training. Pt was totally nonverbal and did not make any sounds even when cued to try to say no, therefore much of the assessment was difficult to complete.  Pt worked on bathing and LB dressing bed level with max cues and A.  Limited initiation and did follow 1 step directions with multimodal cues.  Had his NT come in for 2nd A for sitting pt upright at EOB to guard him. He actually sat with close S but with R lean.   Attempted several times to have him stand in stedy, but pt would not shift wt or initiate at all.  Recommended nursing use bed pan and maxilift for safety at this time.  Did explain to pt what role of OT was but unsure how much he was able to understand.  Pt resting in bed with bed alarm on and all needs met.    Discharge Criteria: Patient will be discharged from OT if patient  refuses treatment 3 consecutive times without medical reason, if treatment goals not met, if there is a change in medical status, if patient makes no progress towards goals or if patient is discharged from hospital.  The above assessment, treatment plan, treatment alternatives and goals were discussed and mutually agreed upon: No family available/patient unable  [facility] 10/25/2020, 11:20 AM

## 2020-10-25 NOTE — Evaluation (Signed)
Speech Language Pathology Assessment and Plan  Patient Details  Name: Raymond Kerr MRN: [id number] Date of Birth: 02-20-1956  SLP Diagnosis: Aphasia;Apraxia;Cognitive Impairments;Speech and Language deficits;Dysphagia  Rehab Potential: Fair ELOS: 4 weeks   Today's Date: 10/25/2020 SLP Individual Time: 1105-1200 SLP Individual Time Calculation (min): 55 min  Hospital Problem: Active Problems:   Left thalamic infarction [facility])  Past Medical History:  Past Medical History:  Diagnosis Date   Diabetes mellitus without complication ([place])    GERD (gastroesophageal reflux disease)    Hypertension    Stroke [facility])    Past Surgical History:  Past Surgical History:  Procedure Laterality Date   TEE WITHOUT CARDIOVERSION N/A 10/08/2020   Procedure: TRANSESOPHAGEAL ECHOCARDIOGRAM (TEE);  Surgeon: Corey Skains, MD;  Location: [facility] ORS;  Service: Cardiovascular;  Laterality: N/A;    Assessment / Plan / Recommendation Clinical Impression  Raymond Kerr is a 64 year old right-handed male with history of hypertension, diabetes mellitus as well as hyperlipidemia. Presented 10/06/2020 with dysarthria, diplopia, incoordination and right facial drop progressing over 2 days.  Cranial CT scan showed low-density area within the left thalamus and right frontal lobe compatible with subacute to chronic infarcts.  No intracranial hemorrhage.  MRI /MRA small acute infarcts of the left paramedian frontal lobe and ventral medial left thalamus.  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  Moderate narrowing of the right ACA proximal A3 segment.  Patient did not receive tPA.  Echocardiogram with ejection fraction of 40 to 45% left ventricle demonstrated regional wall motion abnormality as well as some septal hypofunction.  Admission chemistries unremarkable aside glucose 260, alcohol negative, troponin 62-68, hemoglobin A1c 10.1, urine drug screen negative.  Cardiology services did follow-up to address  elevated troponin.  EKG normal sinus rhythm with septal infarct age undetermined.  TEE completed 10/08/2020 ejection fraction 50 to 55% right ventricular systolic function was normal mild grade 2 layered and protruding plaque.  No atrial level shunt detected by color-flow Doppler.  Patient had been placed on dual antiplatelet therapy for CVA advised to continue and monitor no further cardiac diagnostics at this time.  Subcutaneous Lovenox for DVT prophylaxis.  Hospital course 3 days after his admission he developed increasing aphasia and right side weakness.  Repeat MRI revealed extension of both his left frontal and brainstem infarcts as well as new watershed infarct in the internal border zone on the left.  EEG was completed showing no seizure activity.  Neurology advised to continue aspirin and Plavix therapy x90 days given severe intracranial stenosis followed by aspirin 81 mg daily after that.  He is currently maintained on mechanical soft diet Limited p.o. intake concerns of aspiration pneumonia there was some discussion of possible need for PEG tube of which family adamantly at this time refused.  Therapy evaluations completed due to patient's aphasia and decreased functional mobility was recommended for a comprehensive rehab program.  Patient transferred to CIR on 10/24/2020  Pt presents with severe expressive and moderate receptive aphasia during evaluation this date, likely exacerbated by fatigue and lethargy. Pt only opening R eye throughout assessment, extra time and cues required to increase sustained attention. Pt able to follow 1-step directions with 20% accuracy, unable to mimic pointing/thumbs up/down, etc. Pt did attempt to verbalize on occasion, vocal quality is low and breathy, minimal movement of articulators during attempts at speech. Pt responding to simple yes/no questions ~75% of opportunities, only shaking head "no" (no indication of "yes" response despite cues). Difficult to determine  cognitive implications  s/p CVA due to significant aphasia, will assess as pt able.  Pt presents with mild oropharyngeal dysphagia with moderately decreased labial, lingual and mandibular strength, ROM and coordination revealed during oral motor examination. Pt administered thin liquids via cup and straw, no overt s/s aspiration however swallow timing, control and coordination did appear diminished with straw sips. Puree and soft solids with extended oral phase and min oral stasis in R buccal area and tongue after swallow. Pt has difficulty implementing safe swallow strategies independently, recommend full supervision at meals (total A for feeding at this time). Cont Dys 3/thin liquids with no straw at this time with strict swallow precautions.   Unable to assess cognitive function at this time d/t expressive/receptive aphasia, however will target basic cognition as it is appropriate.    Skilled Therapeutic Interventions          Pt participating in Bedside Swallow Evaluation and non-standardized assessments of speech, language and cognition. Please see above.   SLP Assessment  Patient will need skilled Speech Lanaguage Pathology Services during CIR admission    Recommendations  SLP Diet Recommendations: Dysphagia 3 (Mech soft);Thin Liquid Administration via: Cup Medication Administration: Crushed with puree Supervision: Staff to assist with self feeding;Full supervision/cueing for compensatory strategies Compensations: Minimize environmental distractions;Slow rate;Small sips/bites;Lingual sweep for clearance of pocketing;Multiple dry swallows after each bite/sip;Follow solids with liquid Postural Changes and/or Swallow Maneuvers: Out of bed for meals;Seated upright 90 degrees;Upright 30-60 min after meal Oral Care Recommendations: Oral care BID;Oral care before and after PO;Staff/trained caregiver to provide oral care Patient destination: Home Follow up Recommendations: Home Health SLP;Outpatient  SLP;Skilled Nursing facility Equipment Recommended: To be determined    SLP Frequency 3 to 5 out of 7 days   SLP Duration  SLP Intensity  SLP Treatment/Interventions 4 weeks  Minumum of 1-2 x/day, 30 to 90 minutes  Cognitive remediation/compensation;Dysphagia/aspiration precaution training;Internal/external aids;Speech/Language facilitation;Therapeutic Activities;Cueing hierarchy;Functional tasks;Multimodal communication approach;Patient/family education;Therapeutic Exercise    Pain Pain Assessment Pain Scale: Faces Pain Score: 0-No pain Faces Pain Scale: No hurt  Prior Functioning Cognitive/Linguistic Baseline: Within functional limits Type of Home: House  Lives With: Daughter Available Help at Discharge: Family Education: HS Vocation: Full time employment  SLP Evaluation Cognition Overall Cognitive Status: Impaired/Different from baseline Arousal/Alertness: Awake/alert Orientation Level: Oriented to person Year:  (non verbal) Month:  (non verbal) Day of Week:  (non verbal) Attention: Focused;Sustained Focused Attention: Impaired Focused Attention Impairment: Verbal basic;Functional basic Sustained Attention: Impaired Sustained Attention Impairment: Verbal basic;Functional basic Selective Attention: Impaired Selective Attention Impairment: Functional basic Memory:  (unable to assess 2' aphasia) Immediate Memory Recall:  (non verbal) Awareness: Impaired Awareness Impairment:  (difficult to assess d/t aphasia) Problem Solving: Impaired Problem Solving Impairment: Functional basic Safety/Judgment: Impaired  Comprehension Auditory Comprehension Overall Auditory Comprehension: Impaired Yes/No Questions: Impaired Basic Biographical Questions: 26-50% accurate Basic Immediate Environment Questions: 25-49% accurate Complex Questions: 25-49% accurate Commands: Impaired One Step Basic Commands: 25-49% accurate Two Step Basic Commands: 0-24% accurate Multistep Basic  Commands: 0-24% accurate Conversation: Simple Other Conversation Comments: severe expressive deficits present; potential Apraxia Interfering Components: Motor planning;Processing speed EffectiveTechniques: Extra processing time;Pausing;Repetition;Slowed speech Visual Recognition/Discrimination Discrimination: Not tested Reading Comprehension Reading Status: Not tested Expression Expression Primary Mode of Expression: Verbal Verbal Expression Overall Verbal Expression: Impaired Initiation: Impaired Automatic Speech: Name Level of Generative/Spontaneous Verbalization: Word Repetition: Impaired Level of Impairment: Word level Naming: Impairment Responsive: 0-25% accurate Confrontation: Impaired Convergent: Not tested Divergent: Not tested Other Naming Comments: severe expressive deficits present; potential Apraxia Verbal Errors:  Phonemic paraphasias;Semantic paraphasias Non-Verbal Means of Communication: Gestures Other Verbal Expression Comments: suspect Apraxia Written Expression Dominant Hand: Right Written Expression: Not tested Oral Motor Oral Motor/Sensory Function Overall Oral Motor/Sensory Function: Moderate impairment Facial ROM: Reduced right;Suspected CN VII (facial) dysfunction Facial Symmetry: Abnormal symmetry right;Suspected CN VII (facial) dysfunction Facial Strength: Reduced right;Suspected CN VII (facial) dysfunction Facial Sensation: Reduced right;Suspected CN V (Trigeminal) dysfunction Lingual ROM: Reduced right;Suspected CN XII (hypoglossal) dysfunction Lingual Symmetry: Abnormal symmetry right;Suspected CN XII (hypoglossal) dysfunction Lingual Strength: Reduced;Suspected CN XII (hypoglossal) dysfunction Lingual Sensation: Reduced;Suspected CN VII (facial) dysfunction-anterior 2/3 tongue Velum: Within Functional Limits Mandible: Within Functional Limits Motor Speech Overall Motor Speech: Impaired Respiration: Within functional limits Phonation: Low  vocal intensity Resonance: Within functional limits Articulation: Impaired Level of Impairment: Word Intelligibility: Intelligibility reduced Word: 0-24% accurate Phrase: 0-24% accurate Motor Planning: Impaired Level of Impairment: Word Effective Techniques: Slow rate;Over-articulate;Pause  Care Tool Care Tool Cognition Ability to hear (with hearing aid or hearing appliances if normally used Ability to hear (with hearing aid or hearing appliances if normally used): 1. Minimal difficulty - difficulty in some environments (e.g. when person speaks softly or setting is noisy)   Expression of Ideas and Wants Expression of Ideas and Wants: 1. Rarely/Never expressess or very difficult - rarely/never expresses self or speech is very difficult to understand   Understanding Verbal and Non-Verbal Content Understanding Verbal and Non-Verbal Content: 2. Sometimes understands - understands only basic conversations or simple, direct phrases. Frequently requires cues to understand  Memory/Recall Ability Memory/Recall Ability : That he or she is in a hospital/hospital unit   PMSV Assessment  PMSV Trial Intelligibility: Intelligibility reduced Word: 0-24% accurate Phrase: 0-24% accurate  Bedside Swallowing Assessment General Date of Onset: 10/05/20 Previous Swallow Assessment: 10/09/2020 Diet Prior to this Study: Dysphagia 3 (soft);Thin liquids Temperature Spikes Noted: No Respiratory Status: Room air History of Recent Intubation: No Behavior/Cognition: Lethargic/Drowsy;Requires cueing Oral Cavity - Dentition: Adequate natural dentition;Missing dentition (loose lower tooth) Self-Feeding Abilities: Total assist Patient Positioning: Upright in bed Baseline Vocal Quality: Low vocal intensity Volitional Cough: Cognitively unable to elicit Volitional Swallow: Unable to elicit  Oral Care Assessment Does patient have any of the following "high(er) risk" factors?: None of the above Does patient have  any of the following "at risk" factors?: Other - dysphagia Patient is HIGH RISK: Non-ventilated: Order set for Adult Oral Care Protocol initiated - "High Risk Patients - Non-Ventilated" option selected  (see row information) Patient is AT RISK: Order set for Adult Oral Care Protocol initiated -  "At Risk Patients" option selected (see row information) Patient is LOW RISK: Follow universal precautions (see row information) Ice Chips Ice chips: Impaired Presentation: Spoon Oral Phase Impairments: Reduced lingual movement/coordination;Poor awareness of bolus Oral Phase Functional Implications: Prolonged oral transit Pharyngeal Phase Impairments: Suspected delayed Swallow Other Comments: slow motor movements Thin Liquid Presentation: Cup;Straw Oral Phase Impairments: Poor awareness of bolus Oral Phase Functional Implications: Prolonged oral transit Pharyngeal  Phase Impairments: Suspected delayed Swallow;Multiple swallows Other Comments: drowsy Nectar Thick Nectar Thick Liquid: Not tested Honey Thick Honey Thick Liquid: Not tested Puree Puree: Impaired Presentation: Spoon Oral Phase Impairments: Poor awareness of bolus;Reduced lingual movement/coordination Oral Phase Functional Implications: Prolonged oral transit Pharyngeal Phase Impairments: Suspected delayed Swallow;Multiple swallows Other Comments: slow motor movements Solid Solid: Impaired Presentation: Spoon Oral Phase Impairments: Impaired mastication Oral Phase Functional Implications: Impaired mastication;Prolonged oral transit Pharyngeal Phase Impairments: Multiple swallows Other Comments: drowsy BSE Assessment Risk for Aspiration Impact on safety and function: Mild  aspiration risk;Risk for inadequate nutrition/hydration Other Related Risk Factors: Previous CVA;Lethargy;Deconditioning;History of GERD  Short Term Goals: Week 1: SLP Short Term Goal 1 (Week 1): Pt will follow 1-step directions for orientation to  communication board to increase expressive communication given moderate assist SLP Short Term Goal 2 (Week 1): Pt will imitate a single bilabial sound (m, b, or p) x1/session given maximal verbal, visual (mirror) and tactile cueing SLP Short Term Goal 3 (Week 1): Pt will increase sustained attention to functional tasks to 2-3 minutes with mod A SLP Short Term Goal 4 (Week 1): Pt will tolerate Dys 3/thin diet with minimal s/s aspiration with mod A cues for swallow strategies SLP Short Term Goal 5 (Week 1): Pt will answer simple yes/no questions via verbal/nonverbal responses to communicate wants/needs with mod A  Refer to Care Plan for Long Term Goals  Recommendations for other services: None   Discharge Criteria: Patient will be discharged from SLP if patient refuses treatment 3 consecutive times without medical reason, if treatment goals not met, if there is a change in medical status, if patient makes no progress towards goals or if patient is discharged from hospital.  The above assessment, treatment plan, treatment alternatives and goals were discussed and mutually agreed upon: by patient  Dewaine Conger 10/25/2020, 11:36 AM

## 2020-10-25 NOTE — H&P (Signed)
Physical Medicine and Rehabilitation Admission H&P  CC: Left thalamic infarction  HPI: Raymond Kerr is a 65 year old right-handed male with history of hypertension, diabetes mellitus as well as hyperlipidemia.  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  The home with a single-story home no steps to entry.  His daughter plans to provide assistance on discharge.  There are also 2 other sisters in the area.  Presented 10/06/2020 with dysarthria, diplopia, incoordination and right facial drop progressing over 2 days.  Cranial CT scan showed low-density area within the left thalamus and right frontal lobe compatible with subacute to chronic infarcts.  No intracranial hemorrhage.  MRI /MRA small acute infarcts of the left paramedian frontal lobe and ventral medial left thalamus.  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  Moderate narrowing of the right ACA proximal A3 segment.  Patient did not receive tPA.  Echocardiogram with ejection fraction of 40 to 45% left ventricle demonstrated regional wall motion abnormality as well as some septal hypofunction.  Admission chemistries unremarkable aside glucose 260, alcohol negative, troponin 62-68, hemoglobin A1c 10.1, urine drug screen negative.  Cardiology services did follow-up to address elevated troponin.  EKG normal sinus rhythm with septal infarct age undetermined.  TEE completed 10/08/2020 ejection fraction 50 to 55% right ventricular systolic function was normal mild grade 2 layered and protruding plaque.  No atrial level shunt detected by color-flow Doppler.  Patient had been placed on dual antiplatelet therapy for CVA advised to continue and monitor no further cardiac diagnostics at this time.  Subcutaneous Lovenox for DVT prophylaxis.  Hospital course 3 days after his admission he developed increasing aphasia and right side weakness.  Repeat MRI revealed extension of  both his left frontal and brainstem infarcts as well as new watershed infarct in the internal border zone on the left.  EEG was completed showing no seizure activity.  Neurology advised to continue aspirin and Plavix therapy x90 days given severe intracranial stenosis followed by aspirin 81 mg daily after that.  He is currently maintained on mechanical soft diet Limited p.o. intake concerns of aspiration pneumonia there was some discussion of possible need for PEG tube of which family adamantly at this time refused.  Therapy evaluations completed due to patient's aphasia and decreased functional mobility was admitted for a comprehensive rehab program. Sleepy.   Review of Systems  Constitutional:  Negative for chills and fever.  HENT:  Negative for hearing loss.   Eyes:  Positive for blurred vision and double vision.  Respiratory:  Negative for cough and shortness of breath.   Cardiovascular:  Negative for chest pain, palpitations and leg swelling.  Gastrointestinal:  Positive for constipation. Negative for heartburn, nausea and vomiting.  Genitourinary:  Negative for dysuria, flank pain and hematuria.  Musculoskeletal:  Positive for myalgias.  Skin:  Negative for rash.  Neurological:  Positive for speech change and weakness.  All other systems reviewed and are negative. Past Medical History:  Diagnosis Date   Diabetes mellitus without complication (HCC)    GERD (gastroesophageal reflux disease)    Hypertension    Stroke [facility])    Past Surgical History:  Procedure Laterality Date   TEE WITHOUT CARDIOVERSION N/A 10/08/2020   Procedure: TRANSESOPHAGEAL ECHOCARDIOGRAM (TEE);  Surgeon: Lamar Blinks, MD;  Location: [facility] ORS;  Service: Cardiovascular;  Laterality: N/A;   Family History  Problem Relation Age of Onset  Prostate cancer Neg Hx    Bladder Cancer Neg Hx    Kidney cancer Neg Hx    Social History:  reports that he has never smoked. He has never used smokeless tobacco. He reports  current alcohol use. He reports that he does not use drugs. Allergies: No Known Allergies Medications Prior to Admission  Medication Sig Dispense Refill   aspirin 81 MG chewable tablet Chew 1 tablet (81 mg total) by mouth daily.     atorvastatin (LIPITOR) 80 MG tablet Take 1 tablet (80 mg total) by mouth daily.     clopidogrel (PLAVIX) 75 MG tablet Take 1 tablet (75 mg total) by mouth daily.     insulin aspart (NOVOLOG) 100 UNIT/ML injection Inject 0-15 Units into the skin every 4 (four) hours. 10 mL 11   insulin glargine-yfgn (SEMGLEE) 100 UNIT/ML injection Inject 0.1 mLs (10 Units total) into the skin at bedtime. 10 mL 11   losartan (COZAAR) 25 MG tablet Take 1 tablet (25 mg total) by mouth daily. Hold if sbp less than 110 30 tablet 0   Nystatin (GERHARDT'S BUTT CREAM) CREA Apply 1 application topically 3 (three) times daily.      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 The home is a single-story home without steps to enter. Pt's friend indicates his daugther is disabled and unable to physically assist with any mobility.  Lives With: Daughter   Functional History: Prior Function Level of Independence: Independent Comments: Pt is a independent with ADLs, IADLs and intermittently unloads trunks but mainly drives.   Functional Status:  Mobility: Bed Mobility Overal bed mobility: Needs Assistance Bed Mobility: Sit to Supine, Supine to Sit Rolling: Max assist Supine to sit: HOB elevated, Mod assist Sit to supine:  Max assist, +2 for physical assistance General bed mobility comments: follows commands with increased time. He moved L LE to EOB and needs assist with R LE and trunk support Transfers Overall transfer level: Needs assistance Equipment used: 2 person hand held assist Transfers: Stand Pivot Transfers, Sit to/from Stand Sit to Stand: +2 physical assistance, +2 safety/equipment, Max assist Stand pivot transfers: Max assist, +2 physical assistance Squat pivot transfers: Max assist, +2 physical assistance  Lateral/Scoot Transfers: Max assist, Total assist, +2 physical assistance General transfer comment: Pt very fatigued this session. Ambulation/Gait Ambulation/Gait assistance: +2 safety/equipment, +2 physical assistance, Total assist Gait Distance (Feet): 0.5 Feet Assistive device: 2 person hand held assist Gait Pattern/deviations: Step-to pattern, Trunk flexed General Gait Details: Total-A+ 2 physical assist for RLE, RUE swing and stance w/ L hand rail Gait velocity: decreased   ADL: ADL Overall ADL's : Needs assistance/impaired Grooming: Wash/dry face, Wash/dry hands, Bed level, Set up, Supervision/safety Grooming Details (indicate cue type and reason): Pt washes face with increased time to initiate and sequence and use of L UE to complete task. Lower Body Bathing: Maximal assistance, +2 for physical assistance, Sit to/from stand Lower Body Bathing Details (indicate cue type and reason): MOD A for maintaining standing balance d/t R lateral lean and MAX A for LB bathing. Upper Body Dressing : Moderate assistance, Sitting  Upper Body Dressing Details (indicate cue type and reason): to don/doff hospital gown via hemi-dressing technique. Pt required MOD verbal cues for implementing technique and using R UE as functional assist to fasten x6 snap buttons Toileting- Clothing Manipulation and Hygiene: Maximal assistance, Bed level Toileting - Clothing Manipulation Details (indicate cue type and  reason): for posterior peri-care Functional mobility during ADLs: Maximal assistance, +2 for physical assistance (lateral/scoot transfer with slideboard) General ADL Comments: Pt requires MOD A for LB ADL, set up for self feeding, MIN A for ADL transfers   Cognition: Cognition Overall Cognitive Status: Impaired/Different from baseline Arousal/Alertness: Awake/alert Orientation Level: Oriented to person, Oriented to place, Oriented to time Attention: Selective Selective Attention: Appears intact Memory: Impaired Memory Impairment: Decreased recall of new information Awareness: Appears intact (cursory) Problem Solving:  (CNT fully) Behaviors:  (none) Safety/Judgment:  (CNT fully) [employer] Scales of Cognitive Functioning: Localized response Cognition Arousal/Alertness: Awake/alert Behavior During Therapy: WFL for tasks assessed/performed Overall Cognitive Status: Impaired/Different from baseline Area of Impairment: Problem solving, Following commands, Orientation, Attention, Safety/judgement, Awareness, Memory Orientation Level: Person Current Attention Level: Focused Memory: Decreased recall of precautions, Decreased short-term memory Following Commands: Follows one step commands inconsistently, Follows one step commands with increased time Safety/Judgement: Decreased awareness of safety, Decreased awareness of deficits Awareness: Intellectual Problem Solving: Slow processing, Decreased initiation, Difficulty sequencing, Requires verbal cues, Requires tactile cues General Comments: Follows simple commands intermittently    Physical Exam: Blood pressure 131/78, pulse 75, temperature 98.2 F (36.8 C), temperature source Oral, resp. rate 17, height 6' (1.829 m), weight 116.7 kg, SpO2 99 %. Gen: no distress, normal appearing HEENT: oral mucosa pink and moist, Left eye ptosis Cardio: Reg rate Chest: normal effort, normal rate of breathing Abd: soft, non-distended Ext: no  edema Psych: pleasant, normal affect Skin: intact Neuro: Patient is alert.  He does exhibit expressive aphasia.  He does provide some yes no responses and was initially able to state his first name.  He does mimic some motor commands.    Results for orders placed or performed during the hospital encounter of 10/24/20 (from the past 48 hour(s))  Glucose, capillary     Status: Abnormal   Collection Time: 10/24/20  5:25 PM  Result Value Ref Range   Glucose-Capillary 143 (H) 70 - 99 mg/dL    Comment: Glucose reference range applies only to samples taken after fasting for at least 8 hours.  Glucose, capillary     Status: Abnormal   Collection Time: 10/24/20  9:24 PM  Result Value Ref Range   Glucose-Capillary 226 (H) 70 - 99 mg/dL    Comment: Glucose reference range applies only to samples taken after fasting for at least 8 hours.  Glucose, capillary     Status: Abnormal   Collection Time: 10/25/20  6:25 AM  Result Value Ref Range   Glucose-Capillary 188 (H) 70 - 99 mg/dL    Comment: Glucose reference range applies only to samples taken after fasting for at least 8 hours.   Comment 1 Notify RN   Glucose, capillary     Status: Abnormal   Collection Time: 10/25/20 11:35 AM  Result Value Ref Range   Glucose-Capillary 152 (H) 70 - 99 mg/dL    Comment: Glucose reference range applies only to samples taken after fasting for at least 8 hours.   No results found.     Medical Problem List and Plan: 1.  Right side weakness with aphasia secondary to multifocal ischemia in the left  hemisphere and left brainstem  -patient may shower  -ELOS/Goals: 2 weeks MinA 2.  Antithrombotics: -DVT/anticoagulation:  Pharmaceutical: Lovenox  -antiplatelet therapy: Aspirin 81 mg daily and Plavix 75 mg daily x90 days then aspirin alone 3. Pain Management: Tylenol as needed 4. Mood: Provide emotional support  -antipsychotic agents: N/A 5. Neuropsych: This patient is not capable of making decisions on his  own behalf. 6. Skin/Wound Care: Routine skin checks 7. Fluids/Electrolytes/Nutrition: Routine in and outs with follow-up chemistries 8.  Dysphagia.  Mechanical soft thin liquids..  Patient limited p.o. intake.  Continue Megace to stimulate appetite follow-up speech therapy 9.  Hyperlipidemia.  Lipitor. LDL reviewed- 124.  10.  Diabetes mellitus.  Hemoglobin A1c 10.1.  CBGs 135-226: Semglee 10 units daily.  Diabetic teaching. Start metformin 250mg . Check creatinine Monday. Check CBGs AC/HS.  11.  Obesity.  BMI 35.37-->34.89.  Dietary follow-up 12. Sub-optimal potassium: 20meq klor today  I have personally performed a face to face diagnostic evaluation, including, but not limited to relevant history and physical exam findings, of this patient and developed relevant assessment and plan.  Additionally, I have reviewed and concur with the physician assistant's documentation above.  Horton ChinKrutika P Alydia Gosser, MD 10/25/2020   Mcarthur Rossettianiel J Angiulli, PA-C

## 2020-10-26 LAB — GLUCOSE, CAPILLARY
Glucose-Capillary: 151 mg/dL — ABNORMAL HIGH (ref 70–99)
Glucose-Capillary: 171 mg/dL — ABNORMAL HIGH (ref 70–99)
Glucose-Capillary: 178 mg/dL — ABNORMAL HIGH (ref 70–99)
Glucose-Capillary: 172 mg/dL — ABNORMAL HIGH (ref 70–99)

## 2020-10-26 NOTE — Progress Notes (Signed)
PROGRESS NOTE   Subjective/Complaints: No complaints this morning- hard for patient to express due to aphasia  ROS: Limited given aphasia   Objective:   No results found. No results for input(s): WBC, HGB, HCT, PLT in the last 72 hours. No results for input(s): NA, K, CL, CO2, GLUCOSE, BUN, CREATININE, CALCIUM in the last 72 hours.  Intake/Output Summary (Last 24 hours) at 10/26/2020 1216 Last data filed at 10/26/2020 0810 Gross per 24 hour  Intake 380 ml  Output --  Net 380 ml        Physical Exam: Vital Signs Blood pressure 137/70, pulse 68, temperature 98.7 F (37.1 C), resp. rate 18, height 6' (1.829 m), weight 116.7 kg, SpO2 100 %. Gen: no distress, normal appearing HEENT: oral mucosa pink and moist, NCAT, vision impaired, Left eye ptosis Cardio: Reg rate Chest: normal effort, normal rate of breathing Abd: soft, non-distended Ext: no edema Psych: pleasant, normal affect Skin: intact Neuro: Patient is alert.  He does exhibit expressive aphasia.  He does provide some yes no responses and was initially able to state his first name.  He does mimic some motor commands. right side 0/5 strength, left side 5/5   Assessment/Plan: 1. Functional deficits which require 3+ hours per day of interdisciplinary therapy in a comprehensive inpatient rehab setting. Physiatrist is providing close team supervision and 24 hour management of active medical problems listed below. Physiatrist and rehab team continue to assess barriers to discharge/monitor patient progress toward functional and medical goals  Care Tool:  Bathing    Body parts bathed by patient: Chest, Abdomen, Front perineal area, Left upper leg, Face   Body parts bathed by helper: Right arm, Left arm, Buttocks, Right upper leg, Right lower leg, Left lower leg     Bathing assist Assist Level: Maximal Assistance - Patient 24 - 49%     Upper Body  Dressing/Undressing Upper body dressing   What is the patient wearing?: Pull over shirt    Upper body assist Assist Level: Maximal Assistance - Patient 25 - 49%    Lower Body Dressing/Undressing Lower body dressing      What is the patient wearing?: Incontinence brief, Pants     Lower body assist Assist for lower body dressing: Total Assistance - Patient < 25%     Toileting Toileting    Toileting assist Assist for toileting: 2 Helpers     Transfers Chair/bed transfer  Transfers assist  Chair/bed transfer activity did not occur: Safety/medical concerns  Chair/bed transfer assist level: Dependent - mechanical lift     Locomotion Ambulation   Ambulation assist   Ambulation activity did not occur: Safety/medical concerns          Walk 10 feet activity   Assist  Walk 10 feet activity did not occur: Safety/medical concerns        Walk 50 feet activity   Assist Walk 50 feet with 2 turns activity did not occur: Safety/medical concerns         Walk 150 feet activity   Assist Walk 150 feet activity did not occur: Safety/medical concerns         Walk 10 feet on uneven surface  activity   Assist Walk 10 feet on uneven surfaces activity did not occur: Safety/medical Engineer, technical sales activity did not occur: Safety/medical concerns         Wheelchair 50 feet with 2 turns activity    Assist    Wheelchair 50 feet with 2 turns activity did not occur: Safety/medical concerns       Wheelchair 150 feet activity     Assist  Wheelchair 150 feet activity did not occur: Safety/medical concerns       Blood pressure 137/70, pulse 68, temperature 98.7 F (37.1 C), resp. rate 18, height 6' (1.829 m), weight 116.7 kg, SpO2 100 %.    Medical Problem List and Plan: 1.  Right side weakness with aphasia secondary to multifocal ischemia in the left hemisphere and left brainstem              -patient may shower             -ELOS/Goals: 2 weeks MinA  Continue CIR 2.  Antithrombotics: -DVT/anticoagulation:  Pharmaceutical: Lovenox             -antiplatelet therapy: Aspirin 81 mg daily and Plavix 75 mg daily x90 days then aspirin alone 3. Pain Management: Tylenol as needed 4. Mood: Provide emotional support             -antipsychotic agents: N/A 5. Neuropsych: This patient is not capable of making decisions on his own behalf. 6. Skin/Wound Care: Routine skin checks 7. Fluids/Electrolytes/Nutrition: Routine in and outs with follow-up chemistries 8.  Dysphagia.  Mechanical soft thin liquids..  Patient limited Raymond.o. intake.  Continue Megace to stimulate appetite follow-up speech therapy 9.  Hyperlipidemia.  Lipitor. LDL reviewed- 124.  10.  Diabetes mellitus.  Hemoglobin A1c 10.1.  CBGs 143-171: Semglee 10 units daily.  Diabetic teaching. Increase metformin to 500mg . Repeat CMP tomorrow. Check CBGs AC/HS.  11.  Obesity.  BMI 35.37-->34.89.  Dietary follow-up. Discontinue feeding supplement.  12. Sub-optimal potassium: klor supplemented. Repeat K+ tomorrow.   LOS: 2 days A FACE TO FACE EVALUATION WAS PERFORMED  Raymond Kerr 10/26/2020, 12:16 PM

## 2020-10-26 NOTE — Discharge Instructions (Addendum)
Inpatient Rehab Discharge Instructions  Abdiaziz Klahn Discharge date and time: No discharge date for patient encounter.   Activities/Precautions/ Functional Status: Activity: As tolerated Diet: Diabetic diet/mechanical soft Wound Care: Routine skin checks Functional status:  ___ No restrictions     ___ Walk up steps independently ___ 24/7 supervision/assistance   ___ Walk up steps with assistance ___ Intermittent supervision/assistance  ___ Bathe/dress independently ___ Walk with walker     __x_ Bathe/dress with assistance ___ Walk Independently    ___ Shower independently ___ Walk with assistance    ___ Shower with assistance ___ No alcohol     ___ Return to work/school ________  Special Instructions: No driving smoking or alcohol  Aspirin 81 mg daily and Plavix 75 mg daily x90 days then aspirin  COMMUNITY REFERRALS UPON DISCHARGE:     NO HOME HEALTH AGENCY WOULD ACCEPT DUE TO UNINSURED  Medical Equipment/Items Ordered:HOSPITAL BED                                                 Agency/Supplier:[facility]   [phone]  [facility] [employer] [employer] [phone]  My questions have been answered and I understand these instructions. I will adhere to these goals and the provided educational materials after my discharge from the hospital.  Patient/Caregiver Signature _______________________________ Date __________  Clinician Signature _______________________________________ Date __________  Please bring this form and your medication list with you to all your follow-up doctor's appointments.  STROKE/TIA DISCHARGE INSTRUCTIONS SMOKING Cigarette smoking nearly doubles your risk of having a stroke & is the single most alterable risk factor  If you smoke or have smoked in the last 12 months, you are advised to quit smoking for your health. Most of the excess cardiovascular risk related to smoking disappears within a year of stopping. Ask you doctor about  anti-smoking medications [place] Quit Line: 1-800-QUIT NOW Free Smoking Cessation Classes [phone]  CHOLESTEROL Know your levels; limit fat & cholesterol in your diet  Lipid Panel     Component Value Date/Time   CHOL 174 10/06/2020 0818   TRIG 102 10/06/2020 0818   HDL 30 (L) 10/06/2020 0818   CHOLHDL 5.8 10/06/2020 0818   VLDL 20 10/06/2020 0818   LDLCALC 124 (H) 10/06/2020 0818     Many patients benefit from treatment even if their cholesterol is at goal. Goal: Total Cholesterol (CHOL) less than 160 Goal:  Triglycerides (TRIG) less than 150 Goal:  HDL greater than 40 Goal:  LDL (LDLCALC) less than 100   BLOOD PRESSURE American Stroke Association blood pressure target is less that 120/80 mm/Hg  Your discharge blood pressure is:  BP: 132/74 Monitor your blood pressure Limit your salt and alcohol intake Many individuals will require more than one medication for high blood pressure  DIABETES (A1c is a blood sugar average for last 3 months) Goal HGBA1c is under 7% (HBGA1c is blood sugar average for last 3 months)  Diabetes:    Lab Results  Component Value Date   HGBA1C 10.1 (H) 10/06/2020    Your HGBA1c can be lowered with medications, healthy diet, and exercise. Check your blood sugar as directed by your physician Call your physician if you experience unexplained or low blood sugars.  PHYSICAL ACTIVITY/REHABILITATION Goal is 30 minutes at least 4 days per week  Activity: Increase activity slowly, Therapies: Physical Therapy: Home Health  Return to work:  Activity decreases your risk of heart attack and stroke and makes your heart stronger.  It helps control your weight and blood pressure; helps you relax and can improve your mood. Participate in a regular exercise program. Talk with your doctor about the best form of exercise for you (dancing, walking, swimming, cycling).  DIET/WEIGHT Goal is to maintain a healthy weight  Your discharge diet is:  Diet Order              DIET DYS 3 Room service appropriate? Yes with Assist; Fluid consistency: Thin  Diet effective now                   liquids Your height is:  Height: 6' (182.9 cm) Your current weight is: Weight: 116.7 kg Your Body Mass Index (BMI) is:  BMI (Calculated): 34.89 Following the type of diet specifically designed for you will help prevent another stroke. Your goal weight range is:   Your goal Body Mass Index (BMI) is 19-24. Healthy food habits can help reduce 3 risk factors for stroke:  High cholesterol, hypertension, and excess weight.  RESOURCES Stroke/Support Group:  Call [phone]   STROKE EDUCATION PROVIDED/REVIEWED AND GIVEN TO PATIENT Stroke warning signs and symptoms How to activate emergency medical system (call 911). Medications prescribed at discharge. Need for follow-up after discharge. Personal risk factors for stroke. Pneumonia vaccine given: No Flu vaccine given: No My questions have been answered, the writing is legible, and I understand these instructions.  I will adhere to these goals & educational materials that have been provided to me after my discharge from the hospital.

## 2020-10-26 NOTE — Progress Notes (Signed)
Occupational Therapy Session Note  Patient Details  Name: Raymond Kerr MRN: [id number] Date of Birth: [birth year]  Today's Date: 10/27/2020 OT Individual Time: 4097-3532 OT Individual Time Calculation (min): 59 min   Skilled Therapeutic Interventions/Progress Updates:    Pt greeted in bed, appearing alert and trying to verbally communicate with therapist but words unintelligible. Note that pt shook his head to all questions asked today, no s/s pain throughout tx. Placed pt in chair position in bed to eat his breakfast while focusing on trunk control. Worked on initiation, praxis, and basic problem solving while eating his meal. He initiated reaching for his fork after ~1 minute post presentation. Pt scooped his food with the fork with ~50% accuracy, often brought fork to mouth without food on it and took a bite. He used the spoon more appropriately though OT did have to assist him with scooping when food was near the edge of his plate. He did well initiating when presented with items on his tray like juice, water, and napkin. OT facilitated alternating solids/liquids to improve oral clearance as pt tended to hold food in his mouth without swallowing. 1 cough when pt consumed his juice at a quickened pace, CGA when consuming beverages to ensure slow rate for safety. +2 assist for supine<sit with pt initiating transition to EOB. Note that pt presented with a heavy posterior Rt lean initially. When presented with a toothbrush with toothpaste on it, pt initiated taking the brush and using it appropriately. Close supervision for balance while he brushed his teeth. Pt also took a warm wash cloth from OT and brought it to his face to wash face. Note that pt exhibits some dysmetria during reaching, ?deficits with coordination vs proprioception or both. Pt returned to bed at close of session with +2 assistance. Left him with all needs within reach, bed alarm set, and hemiplegic side protected. Note that his affected  Rt arm has a lot of hypertonicity in the elbow, limb nonfunctional during session today.   Therapy Documentation Precautions:  Precautions Precautions: Fall Precaution Comments: R hemi Restrictions Weight Bearing Restrictions: No Pain Assessment Pain Scale: Faces Pain Score: 0-No pain Faces Pain Scale: No hurt ADL: ADL Upper Body Bathing: Moderate assistance Where Assessed-Upper Body Bathing: Bed level Lower Body Bathing: Dependent Where Assessed-Lower Body Bathing: Bed level Upper Body Dressing: Maximal assistance Where Assessed-Upper Body Dressing: Edge of bed Lower Body Dressing: Dependent Where Assessed-Lower Body Dressing: Bed level      Therapy/Group: Individual Therapy  [name] A Landis Dowdy 10/27/2020, 12:18 PM

## 2020-10-27 LAB — COMPREHENSIVE METABOLIC PANEL
ALT: 59 U/L — ABNORMAL HIGH (ref 0–44)
AST: 76 U/L — ABNORMAL HIGH (ref 15–41)
Albumin: 2.8 g/dL — ABNORMAL LOW (ref 3.5–5.0)
Alkaline Phosphatase: 67 U/L (ref 38–126)
Anion gap: 10 (ref 5–15)
BUN: 17 mg/dL (ref 8–23)
CO2: 21 mmol/L — ABNORMAL LOW (ref 22–32)
Calcium: 10 mg/dL (ref 8.9–10.3)
Chloride: 104 mmol/L (ref 98–111)
Creatinine, Ser: 0.96 mg/dL (ref 0.61–1.24)
GFR, Estimated: >60 mL/min (ref >60)
Glucose, Bld: 161 mg/dL — ABNORMAL HIGH (ref 70–99)
Potassium: 3.8 mmol/L (ref 3.5–5.1)
Sodium: 135 mmol/L (ref 135–145)
Total Bilirubin: 0.5 mg/dL (ref 0.3–1.2)
Total Protein: 7 g/dL (ref 6.5–8.1)

## 2020-10-27 LAB — CBC WITH DIFFERENTIAL/PLATELET
Abs Immature Granulocytes: 0.06 10*3/uL (ref 0.00–0.07)
Basophils Absolute: 0 10*3/uL (ref 0.0–0.1)
Basophils Relative: 0 %
Eosinophils Absolute: 0.1 10*3/uL (ref 0.0–0.5)
Eosinophils Relative: 2 %
HCT: 41.1 % (ref 39.0–52.0)
Hemoglobin: 13.5 g/dL (ref 13.0–17.0)
Immature Granulocytes: 1 %
Lymphocytes Relative: 23 %
Lymphs Abs: 1.6 10*3/uL (ref 0.7–4.0)
MCH: 28.9 pg (ref 26.0–34.0)
MCHC: 32.8 g/dL (ref 30.0–36.0)
MCV: 88 fL (ref 80.0–100.0)
Monocytes Absolute: 0.4 10*3/uL (ref 0.1–1.0)
Monocytes Relative: 5 %
Neutro Abs: 4.7 10*3/uL (ref 1.7–7.7)
Neutrophils Relative %: 69 %
Platelets: 313 10*3/uL (ref 150–400)
RBC: 4.67 MIL/uL (ref 4.22–5.81)
RDW: 14 % (ref 11.5–15.5)
WBC: 6.9 10*3/uL (ref 4.0–10.5)
nRBC: 0 % (ref 0.0–0.2)

## 2020-10-27 LAB — GLUCOSE, CAPILLARY
Glucose-Capillary: 144 mg/dL — ABNORMAL HIGH (ref 70–99)
Glucose-Capillary: 193 mg/dL — ABNORMAL HIGH (ref 70–99)
Glucose-Capillary: 129 mg/dL — ABNORMAL HIGH (ref 70–99)
Glucose-Capillary: 200 mg/dL — ABNORMAL HIGH (ref 70–99)

## 2020-10-27 NOTE — Progress Notes (Addendum)
Speech Language Pathology Daily Session Note  Patient Details  Name: Raymond Kerr MRN: [id number] Date of Birth: 06/29/1955  Today's Date: 10/27/2020 SLP Individual Time: 1116-1200 SLP Individual Time Calculation (min): 44 min  Short Term Goals: Week 1: SLP Short Term Goal 1 (Week 1): Pt will follow 1-step directions for orientation to communication board to increase expressive communication given moderate assist SLP Short Term Goal 2 (Week 1): Pt will imitate a single bilabial sound (m, b, or p) x1/session given maximal verbal, visual (mirror) and tactile cueing SLP Short Term Goal 3 (Week 1): Pt will increase sustained attention to functional tasks to 2-3 minutes with mod A SLP Short Term Goal 4 (Week 1): Pt will tolerate Dys 3/thin diet with minimal s/s aspiration with mod A cues for swallow strategies SLP Short Term Goal 5 (Week 1): Pt will answer simple yes/no questions via verbal/nonverbal responses to communicate wants/needs with mod A  Skilled Therapeutic Interventions: Pt seen for skilled ST with focus on communication and swallowing goals. Pt quite lethargic throughout tx session, benefiting from cues to maintain alertness and frequent rest breaks. Pt following 1-step directions 20% of opportunities, responding to yes/no questions 60% of opportunities however only with head shake "no". Pt with occ attempts to verbalize, mostly groping and unintelligible today. Attempting simple yes/no communication board however unsuccessful this date d/t pt lethargy and difficulty following directions. Pt denying solid trials, agreeable to thins via cup. Pt requiring hand over hand assist to reduce rate and bolus size of thin intake, 1 immediate and strong cough with thin liquids. Staff is reporting pt family bringing in regular textured foods, spoke with NT and RN about Dys 3 recommendations and pt provided extensive education and food list hung in room specifying foods allowed and foods to avoid. Family  will need education when present. Pt left in bed with alarm set and all needs met. Cont ST POC.  Pain Pain Assessment Pain Scale: Faces Pain Score: 0-No pain Faces Pain Scale: No hurt  Therapy/Group: Individual Therapy  Jenna R Stewart 10/27/2020, 11:44 AM 

## 2020-10-27 NOTE — Care Management (Signed)
[facility]patient [facility] Individual Statement of Services  Patient Name:  Jacon Whetzel  Date:  10/27/2020  Welcome to the [facility]patient [facility].  Our goal is to provide you with an individualized program based on your diagnosis and situation, designed to meet your specific needs.  With this comprehensive rehabilitation program, you will be expected to participate in at least 3 hours of rehabilitation therapies Monday-Friday, with modified therapy programming on the weekends.  Your rehabilitation program will include the following services:  Physical Therapy (PT), Occupational Therapy (OT), Speech Therapy (ST), 24 hour per day rehabilitation nursing, Therapeutic Recreaction (TR), Psychology, Neuropsychology, Care Coordinator, Rehabilitation Medicine, Nutrition Services, Pharmacy Services, and Other  Weekly team conferences will be held on Tuesdays to discuss your progress.  Your Inpatient Rehabilitation Care Coordinator will talk with you frequently to get your input and to update you on team discussions.  Team conferences with you and your family in attendance may also be held.  Expected length of stay: 28-34 days   Overall anticipated outcome: Minimal Assistance to Moderate Assistance  Depending on your progress and recovery, your program may change. Your Inpatient Rehabilitation Care Coordinator will coordinate services and will keep you informed of any changes. Your Inpatient Rehabilitation Care Coordinator's name and contact numbers are listed  below.  The following services may also be recommended but are not provided by the Inpatient Rehabilitation Center:  Driving Evaluations Home Health Rehabiltiation Services Outpatient Rehabilitation Services Vocational Rehabilitation   Arrangements will be made to provide these services after discharge if needed.  Arrangements include referral to agencies that provide these services.  Your insurance has been verified to be:   Uninsured  Your primary doctor is:  Office manager  Pertinent information will be shared with your doctor and your insurance company.  Inpatient Rehabilitation Care Coordinator:  Dossie Der, [place] [phone] or Luna Glasgow  Information discussed with and copy given to patient by: Gretchen Short, 10/27/2020, 8:46 AM

## 2020-10-27 NOTE — Progress Notes (Signed)
PROGRESS NOTE   Subjective/Complaints: Remains globally aphasic  Labs reviewed  ROS: Limited given aphasia   Objective:   No results found. Recent Labs    10/27/20 0552  WBC 6.9  HGB 13.5  HCT 41.1  PLT 313   Recent Labs    10/27/20 0552  NA 135  K 3.8  CL 104  CO2 21*  GLUCOSE 161*  BUN 17  CREATININE 0.96  CALCIUM 10.0    Intake/Output Summary (Last 24 hours) at 10/27/2020 1048 Last data filed at 10/27/2020 0849 Gross per 24 hour  Intake 420 ml  Output --  Net 420 ml         Physical Exam: Vital Signs Blood pressure (!) 141/78, pulse 75, temperature 98.8 F (37.1 C), resp. rate 16, height 6' (1.829 m), weight 116.7 kg, SpO2 98 %.  General: No acute distress Mood and affect are appropriate Heart: Regular rate and rhythm no rubs murmurs or extra sounds Lungs: Clear to auscultation, breathing unlabored, no rales or wheezes Abdomen: Positive bowel sounds, soft nontender to palpation, nondistended Extremities: No clubbing, cyanosis, or edema Skin: No evidence of breakdown, no evidence of rash   Neuro:Left eye ptosis, L pupil deviated laterally  No sensation to pinch RUE  Patient is alert.  He does exhibit expressive aphasia.  He does provide some yes no responses and was initially able to state his first name.  He does mimic some motor commands. right side 0/5 strength, left side 5/5   Assessment/Plan: 1. Functional deficits which require 3+ hours per day of interdisciplinary therapy in a comprehensive inpatient rehab setting. Physiatrist is providing close team supervision and 24 hour management of active medical problems listed below. Physiatrist and rehab team continue to assess barriers to discharge/monitor patient progress toward functional and medical goals  Care Tool:  Bathing    Body parts bathed by patient: Chest, Abdomen, Front perineal area, Left upper leg, Face   Body parts bathed by  helper: Right arm, Left arm, Buttocks, Right upper leg, Right lower leg, Left lower leg     Bathing assist Assist Level: Maximal Assistance - Patient 24 - 49%     Upper Body Dressing/Undressing Upper body dressing   What is the patient wearing?: Hospital gown only    Upper body assist Assist Level: Maximal Assistance - Patient 25 - 49%    Lower Body Dressing/Undressing Lower body dressing      What is the patient wearing?: Incontinence brief, Pants     Lower body assist Assist for lower body dressing: Total Assistance - Patient < 25%     Toileting Toileting    Toileting assist Assist for toileting: 2 Helpers     Transfers Chair/bed transfer  Transfers assist  Chair/bed transfer activity did not occur: Safety/medical concerns  Chair/bed transfer assist level: Dependent - mechanical lift     Locomotion Ambulation   Ambulation assist   Ambulation activity did not occur: Safety/medical concerns          Walk 10 feet activity   Assist  Walk 10 feet activity did not occur: Safety/medical concerns        Walk 50 feet activity  Assist Walk 50 feet with 2 turns activity did not occur: Safety/medical concerns         Walk 150 feet activity   Assist Walk 150 feet activity did not occur: Safety/medical concerns         Walk 10 feet on uneven surface  activity   Assist Walk 10 feet on uneven surfaces activity did not occur: Safety/medical concerns         Wheelchair     Assist     Wheelchair activity did not occur: Safety/medical concerns         Wheelchair 50 feet with 2 turns activity    Assist    Wheelchair 50 feet with 2 turns activity did not occur: Safety/medical concerns       Wheelchair 150 feet activity     Assist  Wheelchair 150 feet activity did not occur: Safety/medical concerns       Blood pressure (!) 141/78, pulse 75, temperature 98.8 F (37.1 C), resp. rate 16, height 6' (1.829 m), weight  116.7 kg, SpO2 98 %.    Medical Problem List and Plan: 1.  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  Antithrombotics: -DVT/anticoagulation:  Pharmaceutical: Lovenox             -antiplatelet therapy: Aspirin 81 mg daily and Plavix 75 mg daily x90 days then aspirin alone 3. Pain Management: Tylenol as needed 4. Mood: Provide emotional support             -antipsychotic agents: N/A 5. Neuropsych: This patient is not capable of making decisions on his own behalf. 6. Skin/Wound Care: Routine skin checks 7. Fluids/Electrolytes/Nutrition: Routine in and outs with follow-up chemistries 8.  Dysphagia.  Mechanical soft thin liquids..  Patient limited p.o. intake.  Continue Megace to stimulate appetite follow-up speech therapy 9.  Hyperlipidemia.  Lipitor. LDL reviewed- 124.  10.  Diabetes mellitus.  Hemoglobin A1c 10.1.  CBGs 143-171: Semglee 10 units daily.  Diabetic teaching. Increase metformin to 500mg . Repeat CMP shows nl BUN and Creat  CBG (last 3)  Recent Labs    10/26/20 1629 10/26/20 2133 10/27/20 0610  GLUCAP 178* 172* 144*  Fair control no change in meds today   11.  Obesity.  BMI 35.37-->34.89.  Dietary follow-up. Discontinue feeding supplement.  12. Sub-optimal potassium: 12/27/20 klor supplemented. Repeat K+ 3.8 normal on 9/5.   LOS: 3 days A FACE TO FACE EVALUATION WAS PERFORMED  11/5 10/27/2020, 10:48 AM

## 2020-10-27 NOTE — Progress Notes (Signed)
Patient ID: Raymond Kerr, male   DOB: Feb 13, 1956, 65 y.o.   MRN: [id number] Met with the patient to introduce self and role of the nurse CM. Patient with aphasia CVA and has difficulty w communication. Able to note things are not right and has three concerns however not able to communicate what those were. Patient with diplopia and left eye closed( cranial nerve 3 affected). Briefly reviewed secondary stroke risks and skin care issues. Noted will follow up with family to address educational needs and review medications for management of secondary risk factors. Continue to follow along to discharge to address questions about meds and skin care. Margarito Liner

## 2020-10-27 NOTE — Progress Notes (Signed)
Patient ID: Raymond Kerr, male   DOB: 03-Jan-1956, 65 y.o.   MRN: [id number]  This SW covering for primary SW, Becky Dupree.   SW made efforts to meet with pt to complete assessment, however, pt aphasic. SW called pt dtr Raymond Kerr ([phone]) to introduce self, explain role, and discuss discharge process. No answer, and SW left message encouraging her to f/u with SW or primary SW [place] tomorrow.   Cecile Sheerer, MSW, LCSWA Office: [phone] Cell: [phone] Fax: [phone]

## 2020-10-27 NOTE — Progress Notes (Signed)
RT NOTE:Patient refusing CPAP at this time.  Patient has not been wearing CPAP at home or at the facility.  Patient needs a sleep study.  Will continue to monitor

## 2020-10-27 NOTE — Progress Notes (Signed)
Inpatient Rehabilitation Care Coordinator Assessment and Plan Patient Details  Name: Raymond Kerr MRN: [id number] Date of Birth: Nov 16, 1955  Today's Date: 10/27/2020  Hospital Problems: Principal Problem:   Left thalamic infarction [facility])  Past Medical History:  Past Medical History:  Diagnosis Date   Diabetes mellitus without complication (HCC)    GERD (gastroesophageal reflux disease)    Hypertension    Stroke [facility])    Past Surgical History:  Past Surgical History:  Procedure Laterality Date   TEE WITHOUT CARDIOVERSION N/A 10/08/2020   Procedure: TRANSESOPHAGEAL ECHOCARDIOGRAM (TEE);  Surgeon: Lamar Blinks, MD;  Location: [facility] ORS;  Service: Cardiovascular;  Laterality: N/A;   Social History:  reports that he has never smoked. He has never used smokeless tobacco. He reports current alcohol use. He reports that he does not use drugs.  Family / Support Systems Marital Status: Widow/Widower Patient Roles: Parent Children: Raymond Kerr Other Supports: TBD Anticipated Caregiver: Dtr Raymond Kerr Ability/Limitations of Caregiver: TBD Caregiver Availability:  (TBD) Family Dynamics: Pt lives with his dtr Raymond Kerr.  Social History Preferred language: English Religion: None Cultural Background: Pt was working as a full time Naval architect. Education: Unable to assess Health Literacy - How often do you need to have someone help you when you read instructions, pamphlets, or other written material from your doctor or pharmacy?: Patient unable to respond Writes: Yes Guardian/Conservator: N/A   Abuse/Neglect Abuse/Neglect Assessment Can Be Completed: Unable to assess, patient is non-responsive or altered mental status  Patient response to: Social Isolation - How often do you feel lonely or isolated from those around you?: Patient unable to respond  Emotional Status Pt's affect, behavior and adjustment status: Pt appeared to be in a good mood at time of visit. Pt aphasiac and unable  to communicate with SW. Recent Psychosocial Issues: Unable to assess Psychiatric History: Unable to assess Substance Abuse History: Unable to assess  Patient / Family Perceptions, Expectations & Goals Pt/Family understanding of illness & functional limitations: TBD Premorbid pt/family roles/activities: Independent PTA Anticipated changes in roles/activities/participation: TBD Pt/family expectations/goals: TBD  [employer]: None Premorbid Home Care/DME Agencies: None Transportation available at discharge: TBD Is the patient able to respond to transportation needs?: Yes In the past 12 months, has lack of transportation kept you from medical appointments or from getting medications?: No In the past 12 months, has lack of transportation kept you from meetings, work, or from getting things needed for daily living?: No Resource referrals recommended: Neuropsychology  Discharge Planning Living Arrangements: Children Support Systems: Children Type of Residence: Private residence Insurance Resources: Customer service manager Resources: Employment, Garment/textile technologist Screen Referred: Yes Living Expenses: Lives with family Money Management: Patient Does the patient have any problems obtaining your medications?: No Home Management: Unable to assess Patient/Family Preliminary Plans: TBD Care Coordinator Barriers to Discharge: Decreased caregiver support, Lack of/limited family support Care Coordinator Barriers to Discharge Comments: Pt uninsured Care Coordinator Anticipated Follow Up Needs: HH/OP Expected length of stay: 28-34 days  Clinical Impression This SW covering for primary SW.   Assessment based on chart review. Per EMR, looks like the [employer] is working on getting pt Medicare A/B since he just turned 65 y.o.  Raymond Kerr A Lachell Rochette 10/27/2020, 11:31 AM

## 2020-10-27 NOTE — IPOC Note (Signed)
Overall Plan of Care [facility]) Patient Details Name: Raymond Kerr MRN: [id number] DOB: 12/05/1955  Admitting Diagnosis: Left thalamic infarction [facility])  Hospital Problems: Principal Problem:   Left thalamic infarction [facility])     Functional Problem List: Nursing Endurance, Medication Management, Safety, Skin Integrity, Nutrition, Bowel  PT Balance, Behavior, Edema, Endurance, Motor, Nutrition, Pain, Perception, Safety, Sensory, Skin Integrity  OT Balance, Cognition, Endurance, Motor, Perception, Safety, Sensory, Vision  SLP Cognition, Endurance, Linguistic, Motor, Nutrition, Safety  TR         Basic ADL's: OT Eating, Grooming, Bathing, Dressing, Toileting     Advanced  ADL's: OT       Transfers: PT Bed Mobility, Bed to Chair, Car, [employer], Civil Service fast streamer, Research scientist (life sciences): PT Ambulation, Psychologist, prison and probation services, Stairs     Additional Impairments: OT Fuctional Use of Upper Extremity  SLP Swallowing, Communication, Social Cognition comprehension, expression Memory, Problem Solving, Attention, Awareness  TR      Anticipated Outcomes Item Anticipated Outcome  Self Feeding supervision  Swallowing  Supervision A   Basic self-care  Min A  Toileting  Min A   Bathroom Transfers Min A  Bowel/Bladder  mod I for bowel management  Transfers  Mod A with LRAD  Locomotion  WC level with min assist. Ambulatory with PT.  Communication  min A for basic communication  Cognition  min A for basic cog  Pain  N/A  Safety/Judgment  maintain w cues   Therapy Plan: PT Intensity: Minimum of 1-2 x/day ,45 to 90 minutes PT Frequency: 5 out of 7 days PT Duration Estimated Length of Stay: 31-34 days OT Intensity: Minimum of 1-2 x/day, 45 to 90 minutes OT Frequency: 5 out of 7 days OT Duration/Estimated Length of Stay: 28-30 days SLP Intensity: Minumum of 1-2 x/day, 30 to 90 minutes SLP Frequency: 3 to 5 out of 7 days SLP Duration/Estimated Length of Stay: 4 weeks    Due to the current state of emergency, patients may not be receiving their 3-hours of Medicare-mandated therapy.   Team Interventions: Nursing Interventions Bowel Management, Patient/Family Education, Medication Management, Disease Management/Prevention, Discharge Planning, Skin Care/Wound Management, Dysphagia/Aspiration Precaution Training  PT interventions Ambulation/gait training, Warden/ranger, Cognitive remediation/compensation, Disease management/prevention, Discharge planning, Community reintegration, DME/adaptive equipment instruction, Functional electrical stimulation, Functional mobility training, Patient/family education, Pain management, Neuromuscular re-education, Psychosocial support, Skin care/wound management, Splinting/orthotics, Therapeutic Exercise, Therapeutic Activities, Stair training, UE/LE Strength taining/ROM, UE/LE Coordination activities, Visual/perceptual remediation/compensation, Wheelchair propulsion/positioning  OT Interventions Balance/vestibular training, Cognitive remediation/compensation, Discharge planning, Functional mobility training, DME/adaptive equipment instruction, Neuromuscular re-education, Patient/family education, Psychosocial support, Therapeutic Activities, Skin care/wound managment, Self Care/advanced ADL retraining, Therapeutic Exercise, UE/LE Strength taining/ROM, UE/LE Coordination activities, Visual/perceptual remediation/compensation  SLP Interventions Cognitive remediation/compensation, Dysphagia/aspiration precaution training, Internal/external aids, Speech/Language facilitation, Therapeutic Activities, Cueing hierarchy, Functional tasks, Multimodal communication approach, Patient/family education, Therapeutic Exercise  TR Interventions    SW/CM Interventions Discharge Planning, Psychosocial Support, Patient/Family Education   Barriers to Discharge MD  Medical stability and Nutritional means  Nursing Decreased caregiver support,  Home environment access/layout, New diabetic 1 level 2 ste no rails with disabled dtr  PT Inaccessible home environment, Decreased caregiver support, Home environment access/layout, Lack of/limited family support, Neurogenic Bowel & Bladder, Insurance for SNF coverage, Weight, Medication compliance    OT      SLP Decreased caregiver support 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 Daughter is disabled and unlikely able to physically assist at d/c  SW Decreased caregiver support, Lack  of/limited family support Pt uninsured   Team Discharge Planning: Destination: PT-Skilled Nursing Facility (SNF) ,OT- Home , SLP-Home Projected Follow-up: PT-Home health PT, Skilled nursing facility, OT-  Home health OT, SLP-Home Health SLP, Outpatient SLP, Skilled Nursing facility Projected Equipment Needs: PT-Wheelchair (measurements), Wheelchair cushion (measurements), To be determined, OT- Tub/shower bench, 3 in 1 bedside comode, SLP-To be determined Equipment Details: PT-slide board, OT-  Patient/family involved in discharge planning: PT- Patient, Family member/caregiver,  OT-Patient unable/family or caregiver not available, SLP-Patient  MD ELOS: 18-21d Medical Rehab Prognosis:  Good Assessment: 65 year old right-handed male with history of hypertension, diabetes mellitus as well as hyperlipidemia.  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  The home with a single-story home no steps to entry.  His daughter plans to provide assistance on discharge.  There are also 2 other sisters in the area.  Presented 10/06/2020 with dysarthria, diplopia, incoordination and right facial drop progressing over 2 days.  Cranial CT scan showed low-density area within the left thalamus and right frontal lobe compatible with subacute to chronic infarcts.  No intracranial hemorrhage.  MRI /MRA small acute infarcts of the left paramedian frontal lobe and  ventral medial left thalamus.  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  Moderate narrowing of the right ACA proximal A3 segment.  Patient did not receive tPA.  Echocardiogram with ejection fraction of 40 to 45% left ventricle demonstrated regional wall motion abnormality as well as some septal hypofunction.  Admission chemistries unremarkable aside glucose 260, alcohol negative, troponin 62-68, hemoglobin A1c 10.1, urine drug screen negative.  Cardiology services did follow-up to address elevated troponin.  EKG normal sinus rhythm with septal infarct age undetermined.  TEE completed 10/08/2020 ejection fraction 50 to 55% right ventricular systolic function was normal mild grade 2 layered and protruding plaque.  No atrial level shunt detected by color-flow Doppler.  Patient had been placed on dual antiplatelet therapy for CVA advised to continue and monitor no further cardiac diagnostics at this time.  Subcutaneous Lovenox for DVT prophylaxis.  Hospital course 3 days after his admission he developed increasing aphasia and right side weakness.  Repeat MRI revealed extension of both his left frontal and brainstem infarcts as well as new watershed infarct in the internal border zone on the left.  EEG was completed showing no seizure activity.  Neurology advised to continue aspirin and Plavix therapy x90 days given severe intracranial stenosis followed by aspirin 81 mg daily after that.  He is currently maintained on mechanical soft diet Limited p.o. intake concerns of aspiration pneumonia there was some discussion of possible need for PEG tube of which family adamantly at this time refused.  Therapy evaluations completed due to patient's aphasia and decreased functional mobility was admitted for a comprehensive rehab program. [place].       See Team Conference Notes for weekly updates to the plan of care

## 2020-10-27 NOTE — Progress Notes (Signed)
Physical Therapy Session Note  Patient Details  Name: Raymond Kerr MRN: [id number] Date of Birth: [birth year]  Today's Date: 10/27/2020 PT Individual Time: 1300-1414 PT Individual Time Calculation (min): 74 min   Short Term Goals: Week 1:  PT Short Term Goal 1 (Week 1): Pt will perform bed mobility with max assist of 1. PT Short Term Goal 2 (Week 1): Pt will perform sit<>stand with total A of 1. PT Short Term Goal 3 (Week 1): Pt will initiate gait training PT Short Term Goal 4 (Week 1): Pt will tolerate sitting in WC >2 hours between therapies  Skilled Therapeutic Interventions/Progress Updates:     Pt supine in bed at start - nonverbal but appears agreeable to therapy. Shakes head 'no' to pain. Donned pants at bed level with +2 totalA. Able to roll towards his R with modA and requires +2 totalA for rolling towards his stronger L side. Completed supine<>sit with +2 totalA with HOB flat and required +2 maxA for initial sitting EOB due to strong posterior lean and PUSHING to the paretic R side. Able to progress to minA for sitting balance with cues and time for upright. Used Stedy with EOB raised and completed Stedy transfer with +2 maxA and needing modA for sitting balance on perched position 2/2 pushing to the R. Wheeled down to main rehab gym for time in TIS w/c. Attempted to complete Stedy transfer from w/c to mat table but pt unable to stand due to strong posterior extension and pushing - used sliding board and required +2 totalA for sliding board transfer to mat table. Once sitting edge of mat, able to sit with CGA and used mirror for visual feedback. Focused remainder of session on sitting balance, midline awareness, targeted reaching, and R attention. Used cones for stacking in multiple plans, emphasizing reaching towards his R and down towards the floor to promote forward weight shift. Pt assisted back to his TIS w/c with +2 totalA sliding board transfer, required +2 totalA for repositioning  in chair. Pt returned to his room and safety belt alarm applied, remained seated and reclined in TIS w/c. RN notified of pt's status and mobility, recommending maximove for transfer device to return to bed later in the evening.   Therapy Documentation Precautions:  Precautions Precautions: Fall Precaution Comments: R hemi Restrictions Weight Bearing Restrictions: No General:     Therapy/Group: Individual Therapy  Orrin Brigham 10/27/2020, 7:46 AM

## 2020-10-28 LAB — GLUCOSE, CAPILLARY
Glucose-Capillary: 168 mg/dL — ABNORMAL HIGH (ref 70–99)
Glucose-Capillary: 197 mg/dL — ABNORMAL HIGH (ref 70–99)
Glucose-Capillary: 195 mg/dL — ABNORMAL HIGH (ref 70–99)
Glucose-Capillary: 214 mg/dL — ABNORMAL HIGH (ref 70–99)

## 2020-10-28 MED ORDER — POTASSIUM CHLORIDE 20 MEQ PO PACK
20.0000 meq | PACK | Freq: Every day | ORAL | Status: DC
  Administered 2020-10-28 – 2020-11-25 (×29): 20 meq via ORAL
  Filled 2020-10-28 (×30): qty 1

## 2020-10-28 MED ORDER — BACLOFEN 5 MG HALF TABLET
5.0000 mg | ORAL_TABLET | Freq: Every day | ORAL | Status: DC
  Administered 2020-10-28: 5 mg via ORAL
  Filled 2020-10-28: qty 1

## 2020-10-28 MED ORDER — METFORMIN HCL 500 MG PO TABS
250.0000 mg | ORAL_TABLET | Freq: Two times a day (BID) | ORAL | Status: DC
  Administered 2020-10-28 – 2020-10-30 (×4): 250 mg via ORAL
  Filled 2020-10-28 (×4): qty 1

## 2020-10-28 NOTE — Progress Notes (Addendum)
Patient ID: Raymond Kerr, male   DOB: 09-16-1955, 65 y.o.   MRN: [id number] Attempted to call daughter-[name] and left a message. Will await return call from her and work on discharge needs.  3:22 PM Spoke with [name]-daughter via telephone to discuss team conference goals of min-mod and target discharge date of 10/1. She reports she is disabled and does not work, asked if she can provide physical care due to being disabled and she voiced between she and her two sister's they can provide assist. Not sure she understands the amount of care that is being discussed. Hopefully pt will do better than the goals set but will need to see. She has spoken with Virl Diamond regarding pt's medicare. This worker will also reach out to [place] to see if anything needed and time frame of when will be approved. Will continue to work on discharge plan and have daughter's come in sooner than later to begin education on Dad's care.

## 2020-10-28 NOTE — Progress Notes (Addendum)
Speech Language Pathology Daily Session Note  Patient Details  Name: Raymond Kerr MRN: [id number] Date of Birth: [birth year]  Today's Date: 10/28/2020 SLP Individual Time: 1003-1102 SLP Individual Time Calculation (min): 59 min  Short Term Goals: Week 1: SLP Short Term Goal 1 (Week 1): Pt will follow 1-step directions for orientation to communication board to increase expressive communication given moderate assist SLP Short Term Goal 2 (Week 1): Pt will imitate a single bilabial sound (m, b, or p) x1/session given maximal verbal, visual (mirror) and tactile cueing SLP Short Term Goal 3 (Week 1): Pt will increase sustained attention to functional tasks to 2-3 minutes with mod A SLP Short Term Goal 4 (Week 1): Pt will tolerate Dys 3/thin diet with minimal s/s aspiration with mod A cues for swallow strategies SLP Short Term Goal 5 (Week 1): Pt will answer simple yes/no questions via verbal/nonverbal responses to communicate wants/needs with mod A  Skilled Therapeutic Interventions:Skilled ST services focused on language skills. Pt requires initial hand over hand to follow most commands, however was able to demonstrate increase comprehension skills after assistance with initiation. Pt was able to respond to yes/no questions using yes/no communication board with 70% accuracy (7/10 trials) and verbalized "yes" low vocal intensity x1, given biographical and environmental information. Pt was able to write first name fading to mod A multimodal cues to correct spelling/legibility errors. Pt was able to identify common objects matching with photograph in a field of 2 with initial hand over hand required then increasing to 5/5 accuracy, as well as 5/5 accuracy in matching common objects to black/white picture with supervision A semantic cues. Pt was able to match common object to word in field of 2 in 4 out 5 opportunities. SLP presented communication board with 6 black/white ADL pictures and yes/no on back, Pt  was able to identify 5 out 6 requested pictures and selected "bed" when asked to communicate current wants/needs and confirmed via yes/no board. Pt was left in room x2 NT assisting with maximove transfer. SLP recommends to continue skilled services.     Pain Pain Assessment Faces Pain Scale: No hurt  Therapy/Group: Individual Therapy  Raymond Kerr  [facility] 10/28/2020, 11:19 AM

## 2020-10-28 NOTE — Progress Notes (Signed)
Inpatient Rehabilitation  Patient information reviewed and entered into eRehab system by Marquitta Persichetti Tenita Cue, OTR/L.   Information including medical coding, functional ability and quality indicators will be reviewed and updated through discharge.    

## 2020-10-28 NOTE — Progress Notes (Signed)
Physical Therapy Session Note  Patient Details  Name: Raymond Kerr MRN: [id number] Date of Birth: August 30, 1955  Today's Date: 10/28/2020 PT Individual Time: 1430-1530 PT Individual Time Calculation (min): 60 min   Short Term Goals: Week 1:  PT Short Term Goal 1 (Week 1): Pt will perform bed mobility with max assist of 1. PT Short Term Goal 2 (Week 1): Pt will perform sit<>stand with total A of 1. PT Short Term Goal 3 (Week 1): Pt will initiate gait training PT Short Term Goal 4 (Week 1): Pt will tolerate sitting in WC >2 hours between therapies  Skilled Therapeutic Interventions/Progress Updates:     Pt supine in bed to start, RN at bedside administering medications. Pt nonverbal throughout sessions but does follow some simple commands. He had instances of attempting to vocalize but unable to produce. Pt completed supine<>sit with maxA of 1 person with +2 on standby, use of bed features. Able to sit unsupported EOB with modA, progressing to minA with max cues. Completed sliding board transfer with +2 totalA via over-the-back technique in efforts to limit/reduce pushing to his paretic R side. Pt with significant thoracic extension and difficulty initiating/maintaining forward weight shift during transfer. Required +2 totalA for repositioning in chair as well. Pt wheeled down to ortho rehab gym in TIS w/c and setup in the standing frame with mirror placed in front of him for visual feedback of midline orientation. Completed dependant stand in Standing Frame with +2 assist to facilitate upright posture and limit pushing to the R. Once standing, he was able to initiate upright posture and reduce his pushing. He tolerated standing for and 45 seconds without a seated rest break. While standing, worked on functional and targeted cone reaching with his LUE in multiplanes to promote L weight shift and L/R visual scanning (pt's L eye closed throughout session). Also positioned RUE for static prolonged  stretching of biceps/forearm due to his tone. He also completed a simple cognitive puzzle (colored peg borad) which required 75% cues for accuracy. Pt was transported back to his room at end of session where he remained reclined in TIS w/c at conclusion of session, all needs in reach, safety belt alarm on, pt made comfortable.   Therapy Documentation Precautions:  Precautions Precautions: Fall Precaution Comments: R hemi Restrictions Weight Bearing Restrictions: No General:     Therapy/Group: Individual Therapy  Orrin Brigham 10/28/2020, 7:51 AM

## 2020-10-28 NOTE — Progress Notes (Signed)
Occupational Therapy Session Note  Patient Details  Name: Raymond Kerr MRN: [id number] Date of Birth: [birth year]  Today's Date: 10/28/2020 OT Individual Time: 7017-7939 OT Individual Time Calculation (min): 56 min    Short Term Goals: Week 1:  OT Short Term Goal 1 (Week 1): Pt will demonstrate improved initiation with leaning forward and using left hand on bar of stedy to move to stand with max A of 1. OT Short Term Goal 2 (Week 1): Pt will demonstrate improved sit balance to sit EOB with min A while bathing UB with min a. OT Short Term Goal 3 (Week 1): Pt will engage in RUE self ROM with mod A. OT Short Term Goal 4 (Week 1): Pt will don tshirt with mod A.  Skilled Therapeutic Interventions/Progress Updates:  Pt greeted supine in bed. Pt nonverbal but nodding appropraitely throughout session.  Session focus on BADL reeducation, functional transfers and RUE PROM. Pt curerntly requires total A +2 for bed mobility supine>sitting and MAX- MIN A for static sitting balance. When LUE not pushing pt can sit statically with MIN A but when presented with ADL or dynamic task pt needs more of MOD A- MAX A. Pt required total A for LB dressing from bed level and total A +2 for SB transfer to TIS going towards pts L side. Pt transported to sink for ADLs wit pt able to wash RUE with MAX cues. Pt able to wash face with LUE with set- up assist. Remainder of session to focus on PROM to RUE. Initaited edcuation on self ROM with LUE to decreased tone in RUE.  Therex included: X10 reps of elbow flexion/ extension  X10 reps of shoulder felxion to 90* only  Shoulder ABD/ADD to 90*          Applied lotion to RUE with pt rubbing in lotion with set- up assist. pP left seated in TIS with safety belt activated and all needs within reach.            Therapy Documentation Precautions:  Precautions Precautions: Fall Precaution Comments: R hemi Restrictions Weight Bearing Restrictions: No    Pain: Pt grimacing to  pain when ranging RUE, decreased ROM and offered rest breaks as pain mgmt strategy.     Therapy/Group: Individual Therapy  Pollyann Glen [facility] 10/28/2020, 12:16 PM

## 2020-10-28 NOTE — Patient Care Conference (Signed)
Inpatient RehabilitationTeam Conference and Plan of Care Update Date: 10/28/2020   Time:  13:12 PM   Patient Name: Raymond Kerr      Medical Record Number: [id number]  Date of Birth: September 07, 1955 Sex: Male         Room/Bed: 4W22C/4W22C-01 Payor Info: Payor: /    Admit Date/Time:  10/24/2020  4:41 PM  Primary Diagnosis:  Left thalamic infarction [facility])  Hospital Problems: Principal Problem:   Left thalamic infarction [facility])    Expected Discharge Date: Expected Discharge Date: 11/22/20  Team Members Present: Physician leading conference: Dr. Sula Soda Social Worker Present: Dossie Der, LCSW Nurse Present: Chana Bode, RN PT Present: Wynelle Link, PT OT Present: Perrin Maltese, OT SLP Present: Colin Benton, SLP PPS Coordinator present : Fae Pippin, SLP     Current Status/Progress Goal Weekly Team Focus  Bowel/Bladder             Swallow/Nutrition/ Hydration   dys 3 textures and thin liquids, max-mod A  Min A  swallow strategies   ADL's   MAX A bed level bathing, MAX A UB dressing, total A LB dressing, +2 SB transfers to w/c with total A, MIN A for static sitting balance EOB but progressing to MOD at times with dynamic tasks, increased tone in RUE, nonverbal follows commands intermittently  MIN A - Supervision goals  dynamic sitting balance, BADL reeducation,  RUE ROM/NMR, transfers   Mobility   +2 totalA bed mobility, modA for sitting balance, +2 totalA for sliding board transfers, +2 maxA for [place] transfers. R inattention/neglect, L gaze, tone in R hemibody. Nonverbal but follows simple commands. Strong PUSHER to paretic R side  mod/maxA  RLE NMR, functional transfers, sitting balance, midline awareness, R attention, standing as able   Communication   nonverbal, max A expressive and mod A receptive (requires hand over hand assiatance for inititation)  MIn A  yes/no questions, communication board, writting at word level, vocalzing m/p/b sounds    Safety/Cognition/ Behavioral Observations  focused/sustained attention mod A, will add more cognitive goals if needed and when language improves  Min A  focused/sustained attention.   Pain             Skin               Discharge Planning:  Per EMR, pt to d/c to home with assistance from dtrs. SW will confirm no barriers to d/c. Will need to confirm plan-pt is uninsured   Team Discussion: Patient with tone and spasticity right side; MD added medications. T2DM; adjusting meds. Monitoring HTN and added supplement for hypokalemia.  Patient on target to meet rehab goals: Currently +2 assist overall;' EOB, bed mobility, etc., as pt has loss of balance and pushes on right side. Needs mod assist to maintain sitting balance. Requires total assist + 2 for slide board transfers and use of the stedy. Needs max assist for bathing. Progress limited by tone, and non verbal. Patient is able to follow some commands and tolerating a D3 thin diet. Requires mod -max assist for communication and swallowing. Completes yes/no responses with 70% accuracy. Able to match objects with supervision and able to write is name with mod assist. Goals for discharge set at mod - max assist and min assist for SLP.  *See Care Plan and progress notes for long and short-term goals.   Revisions to Treatment Plan:  Working on communication, vocalizations, writing, midline awareness, sitting balance, etc and using multimodal cues.   Teaching Needs: Dietary  modifications, medications, safety, transfers, toileting, etc  Current Barriers to Discharge: Decreased caregiver support, Home enviroment access/layout, Incontinence, Insurance for SNF coverage, Weight, and insurance for follow up services  Possible Resolutions to Barriers: Family education with daughter     Medical Summary Current Status: right sided spasticity, elevated systolic blood pressure, suboptimal potassium, uncontrolled type 2 DM, severe obesity (BMI 34.89)   Barriers to Discharge: Medical stability  Barriers to Discharge Comments: right sided spasticity, elevated systolic blood pressure, suboptimal potassium, uncontrolled type 2 DM, severe obesity (BMI 34.89) Possible Resolutions to [employer] Focus: start baclofen 10mg  HS, start daily potassium supplementation, start metformin, provide dietary counseling   Continued Need for Acute Rehabilitation Level of Care: The patient requires daily medical management by a physician with specialized training in physical medicine and rehabilitation for the following reasons: Direction of a multidisciplinary physical rehabilitation program to maximize functional independence : Yes Medical management of patient stability for increased activity during participation in an intensive rehabilitation regime.: Yes Analysis of laboratory values and/or radiology reports with any subsequent need for medication adjustment and/or medical intervention. : Yes   I attest that I was present, lead the team conference, and concur with the assessment and plan of the team.   B 10/28/2020, 3:35 PM

## 2020-10-28 NOTE — Progress Notes (Signed)
PROGRESS NOTE   Subjective/Complaints: C/o pain and tightness in right hand- worse at night  ROS: +right arm pain and tightness   Objective:   No results found. Recent Labs    10/27/20 0552  WBC 6.9  HGB 13.5  HCT 41.1  PLT 313   Recent Labs    10/27/20 0552  NA 135  K 3.8  CL 104  CO2 21*  GLUCOSE 161*  BUN 17  CREATININE 0.96  CALCIUM 10.0    Intake/Output Summary (Last 24 hours) at 10/28/2020 1125 Last data filed at 10/28/2020 0700 Gross per 24 hour  Intake 716 ml  Output --  Net 716 ml        Physical Exam: Vital Signs Blood pressure 132/74, pulse 77, temperature 98.5 F (36.9 C), resp. rate 16, height 6' (1.829 m), weight 116.7 kg, SpO2 100 %. Gen: no distress, normal appearing HEENT: oral mucosa pink and moist, NCAT Cardio: Reg rate Chest: normal effort, normal rate of breathing Abd: soft, non-distended Ext: no edema Psych: pleasant, normal affect Skin: intact Neuro:Left eye ptosis, L pupil deviated laterally  No sensation to pinch RUE  Patient is alert.  He does exhibit expressive aphasia.  He does provide some yes no responses and was initially able to state his first name.  He does mimic some motor commands. right side 0/5 strength, left side 5/5. Right elbow flexion MAS 3   Assessment/Plan: 1. Functional deficits which require 3+ hours per day of interdisciplinary therapy in a comprehensive inpatient rehab setting. Physiatrist is providing close team supervision and 24 hour management of active medical problems listed below. Physiatrist and rehab team continue to assess barriers to discharge/monitor patient progress toward functional and medical goals  Care Tool:  Bathing    Body parts bathed by patient: Chest, Abdomen, Front perineal area, Left upper leg, Face   Body parts bathed by helper: Right arm, Left arm, Buttocks, Right upper leg, Right lower leg, Left lower leg     Bathing  assist Assist Level: Maximal Assistance - Patient 24 - 49%     Upper Body Dressing/Undressing Upper body dressing   What is the patient wearing?: Hospital gown only    Upper body assist Assist Level: Maximal Assistance - Patient 25 - 49%    Lower Body Dressing/Undressing Lower body dressing      What is the patient wearing?: Incontinence brief     Lower body assist Assist for lower body dressing: Total Assistance - Patient < 25%     Toileting Toileting    Toileting assist Assist for toileting: 2 Helpers     Transfers Chair/bed transfer  Transfers assist  Chair/bed transfer activity did not occur: Safety/medical concerns  Chair/bed transfer assist level: Dependent - mechanical lift     Locomotion Ambulation   Ambulation assist   Ambulation activity did not occur: Safety/medical concerns          Walk 10 feet activity   Assist  Walk 10 feet activity did not occur: Safety/medical concerns        Walk 50 feet activity   Assist Walk 50 feet with 2 turns activity did not occur: Safety/medical concerns  Walk 150 feet activity   Assist Walk 150 feet activity did not occur: Safety/medical concerns         Walk 10 feet on uneven surface  activity   Assist Walk 10 feet on uneven surfaces activity did not occur: Safety/medical concerns         Wheelchair     Assist Is the patient using a wheelchair?:  (yes, per PT note) Type of Wheelchair:  (manual per PT note) Wheelchair activity did not occur: Safety/medical concerns         Wheelchair 50 feet with 2 turns activity    Assist    Wheelchair 50 feet with 2 turns activity did not occur: Safety/medical concerns       Wheelchair 150 feet activity     Assist  Wheelchair 150 feet activity did not occur: Safety/medical concerns       Blood pressure 132/74, pulse 77, temperature 98.5 F (36.9 C), resp. rate 16, height 6' (1.829 m), weight 116.7 kg, SpO2 100  %.    Medical Problem List and Plan: 1.  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  Antithrombotics: -DVT/anticoagulation:  Pharmaceutical: Lovenox             -antiplatelet therapy: Aspirin 81 mg daily and Plavix 75 mg daily x90 days then aspirin alone 3. Pain Management: Tylenol as needed 4. Mood: Provide emotional support             -antipsychotic agents: N/A 5. Neuropsych: This patient is not capable of making decisions on his own behalf. 6. Skin/Wound Care: Routine skin checks 7. Fluids/Electrolytes/Nutrition: Routine in and outs with follow-up chemistries 8.  Dysphagia.  Mechanical soft thin liquids..  Patient limited p.o. intake.  Continue Megace to stimulate appetite follow-up speech therapy 9.  Hyperlipidemia.  Lipitor. LDL reviewed- 124.  10.  Diabetes mellitus.  Hemoglobin A1c 10.1.  CBGs 143-171: Semglee 10 units daily.  Diabetic teaching. Increase metformin to 250mg  BID with meals. Monitor creatinine.  Recent Labs    10/27/20 1634 10/27/20 2131 10/28/20 0617  GLUCAP 129* 200* 168*  Fair control no change in meds today   11.  Obesity.  BMI 35.37-->34.89.  Dietary follow-up. Discontinue feeding supplement.  12. Sub-optimal potassium: start 12/28/20 daily supplement. Monitor K+/  13. Right arm spasticity: start Baclofen HS. Will benefit from outpatient Botox.   LOS: 4 days A FACE TO FACE EVALUATION WAS PERFORMED  Otis Burress P Valory Wetherby 10/28/2020, 11:25 AM

## 2020-10-29 LAB — GLUCOSE, CAPILLARY
Glucose-Capillary: 144 mg/dL — ABNORMAL HIGH (ref 70–99)
Glucose-Capillary: 190 mg/dL — ABNORMAL HIGH (ref 70–99)
Glucose-Capillary: 184 mg/dL — ABNORMAL HIGH (ref 70–99)
Glucose-Capillary: 151 mg/dL — ABNORMAL HIGH (ref 70–99)

## 2020-10-29 MED ORDER — BACLOFEN 10 MG PO TABS
10.0000 mg | ORAL_TABLET | Freq: Every day | ORAL | Status: DC
  Filled 2020-10-29: qty 1

## 2020-10-29 NOTE — Progress Notes (Signed)
Speech Language Pathology Daily Session Note  Patient Details  Name: Raymond Kerr MRN: [id number] Date of Birth: [birth year]  Today's Date: 10/29/2020 SLP Individual Time: 0802-0900 SLP Individual Time Calculation (min): 58 min  Short Term Goals: Week 1: SLP Short Term Goal 1 (Week 1): Pt will follow 1-step directions for orientation to communication board to increase expressive communication given moderate assist SLP Short Term Goal 2 (Week 1): Pt will imitate a single bilabial sound (m, b, or p) x1/session given maximal verbal, visual (mirror) and tactile cueing SLP Short Term Goal 3 (Week 1): Pt will increase sustained attention to functional tasks to 2-3 minutes with mod A SLP Short Term Goal 4 (Week 1): Pt will tolerate Dys 3/thin diet with minimal s/s aspiration with mod A cues for swallow strategies SLP Short Term Goal 5 (Week 1): Pt will answer simple yes/no questions via verbal/nonverbal responses to communicate wants/needs with mod A  Skilled Therapeutic Interventions:Skilled ST services focused on swallow and language skills. Pt initially responded "no" (via head nod) to PO consumption of breakfast (tray was not located on cart), but then appeared to recognize error and attempted to correct pointing to "yes" on communication board. Overall pt responded to yes/ no questions using communication board with what appeared to be 85% accuracy and began independently initiating flipping board over to yes/no side to confirm response. Pt was able to identify 6 out 6 pictures on communication board, however demonstrated unclear expression of wants at times pointing to chair then bed, with ability to clarify response when given yes/no questions. Pt demonstrated increase vocalization and expressive language in today's treatment session. 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 Pt supports via response to yes/no  questions, stutter is not a baseline characteristic, however family is not present to confirm. Pt was able to name common objects in 1 out 3 opportunities given sentence completion cues, then repeat name of common objects with max A verbal cues noting phonetic errors. Pt demonstrated improved verbal and functional error awareness in response to yes/no questions and in phonetic errors requiring mod A verbal cues. Pt required mod A verbal cues for sustained attention in 5-7 minute intervals. Pt was able to write first name (6 letters) with initial letter presented and 3 letter word with initial letter fading to mod I. Pt consumed thin liquids via cup with mild right anterior spillage, noting piecemeal swallow with no overt s/s aspiration. Pt politely refused via head nods to trials of solids.  Pt was left in room with call bell within reach and bed alarm set. SLP recommends to continue skilled services.     Pain Pain Assessment Pain Score: 0-No pain  Therapy/Group: Individual Therapy  Raymond Kerr  [facility] 10/29/2020, 2:37 PM

## 2020-10-29 NOTE — Progress Notes (Signed)
Occupational Therapy Session Note  Patient Details  Name: Raymond Kerr MRN: [id number] Date of Birth: 1955/10/10  Today's Date: 10/29/2020 OT Individual Time: 1030-1130 OT Individual Time Calculation (min): 60 min    Short Term Goals: Week 1:  OT Short Term Goal 1 (Week 1): Pt will demonstrate improved initiation with leaning forward and using left hand on bar of stedy to move to stand with max A of 1. OT Short Term Goal 2 (Week 1): Pt will demonstrate improved sit balance to sit EOB with min A while bathing UB with min a. OT Short Term Goal 3 (Week 1): Pt will engage in RUE self ROM with mod A. OT Short Term Goal 4 (Week 1): Pt will don tshirt with mod A.  Skilled Therapeutic Interventions/Progress Updates:    Pt received supine sleeping with no indications of pain. Throughout session pt would indicate pain via facial grimacing with PROM to his RUE and RLE. Pt incontinent of bowel and required total A +2 for hygiene and donning of new brief. Pt completed rolling to the R with mod A and L with max +2. Pt completed sidelying to sit transfer with max +2 assist. Once seated pt was intermittently able to maintain sitting balance with (S) for ~20-30 sec at at time, but often required verbal and tactile cueing for midline orientation to combat R pushing and lean. From EOB pt completed UB bathing with min A, reaching under his RUE with assist for shoulder flexion but no cueing required for R attention. Pt donned a shirt with max A overall, cueing/facilitation for hemi technique. Pt completed sit > stand from EOB with 3 musketeers technique with heavy max A- good initiation at the hips but HEAVY R push/lean. Pt returned to sitting and slideboard was positioned for transfer to the [place]. Max A for slideboard transfer, good reaching with the LUE, only mod cueing for positioning, demonstrating motor plan memory. Pt completed oral care at the sink with set up assist and no cueing necessary for initiation or  termination. Gentle PROM to the RUE and the tone decreased significantly with relaxation cues. Pt was left sitting up in the TIS w/c with all needs met, chair alarm set.    Therapy Documentation Precautions:  Precautions Precautions: Fall Precaution Comments: R hemi Restrictions Weight Bearing Restrictions: No  Therapy/Group: Individual Therapy  [name] 10/29/2020, 6:18 AM

## 2020-10-29 NOTE — Progress Notes (Signed)
PROGRESS NOTE   Subjective/Complaints: Sleepy this morning As per nursing he did not sleep well last night  ROS: +right arm pain and tightness, +insomnia   Objective:   No results found. Recent Labs    10/27/20 0552  WBC 6.9  HGB 13.5  HCT 41.1  PLT 313   Recent Labs    10/27/20 0552  NA 135  K 3.8  CL 104  CO2 21*  GLUCOSE 161*  BUN 17  CREATININE 0.96  CALCIUM 10.0    Intake/Output Summary (Last 24 hours) at 10/29/2020 1008 Last data filed at 10/28/2020 1300 Gross per 24 hour  Intake 236 ml  Output --  Net 236 ml        Physical Exam: Vital Signs Blood pressure 124/87, pulse 93, temperature 98.4 F (36.9 C), resp. rate 18, height 6' (1.829 m), weight 116.7 kg, SpO2 100 %. Gen: no distress, normal appearing HEENT: oral mucosa pink and moist, NCAT Cardio: Reg rate Chest: normal effort, normal rate of breathing Abd: soft, non-distended Ext: no edema Psych: pleasant, normal affect Skin: intact  Neuro:Left eye ptosis, L pupil deviated laterally  No sensation to pinch RUE  Patient is alert.  He does exhibit expressive aphasia.  He does provide some yes no responses and was initially able to state his first name.  He does mimic some motor commands. right side 0/5 strength, left side 5/5. Right elbow flexion MAS 3   Assessment/Plan: 1. Functional deficits which require 3+ hours per day of interdisciplinary therapy in a comprehensive inpatient rehab setting. Physiatrist is providing close team supervision and 24 hour management of active medical problems listed below. Physiatrist and rehab team continue to assess barriers to discharge/monitor patient progress toward functional and medical goals  Care Tool:  Bathing    Body parts bathed by patient: Face   Body parts bathed by helper: Right arm, Left arm, Buttocks, Right upper leg, Right lower leg, Left lower leg     Bathing assist Assist Level: Set  up assist     Upper Body Dressing/Undressing Upper body dressing   What is the patient wearing?: Hospital gown only    Upper body assist Assist Level: Maximal Assistance - Patient 25 - 49%    Lower Body Dressing/Undressing Lower body dressing      What is the patient wearing?: Pants     Lower body assist Assist for lower body dressing: Total Assistance - Patient < 25%     Toileting Toileting    Toileting assist Assist for toileting: 2 Helpers     Transfers Chair/bed transfer  Transfers assist  Chair/bed transfer activity did not occur: Safety/medical concerns  Chair/bed transfer assist level: 2 Helpers (SB transfer)     Locomotion Ambulation   Ambulation assist   Ambulation activity did not occur: Safety/medical concerns          Walk 10 feet activity   Assist  Walk 10 feet activity did not occur: Safety/medical concerns        Walk 50 feet activity   Assist Walk 50 feet with 2 turns activity did not occur: Safety/medical concerns         Walk 150  feet activity   Assist Walk 150 feet activity did not occur: Safety/medical concerns         Walk 10 feet on uneven surface  activity   Assist Walk 10 feet on uneven surfaces activity did not occur: Safety/medical concerns         Wheelchair     Assist Is the patient using a wheelchair?:  (yes, per PT note) Type of Wheelchair:  (manual per PT note) Wheelchair activity did not occur: Safety/medical concerns         Wheelchair 50 feet with 2 turns activity    Assist    Wheelchair 50 feet with 2 turns activity did not occur: Safety/medical concerns       Wheelchair 150 feet activity     Assist  Wheelchair 150 feet activity did not occur: Safety/medical concerns       Blood pressure 124/87, pulse 93, temperature 98.4 F (36.9 C), resp. rate 18, height 6' (1.829 m), weight 116.7 kg, SpO2 100 %.    Medical Problem List and Plan: 1.  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  Impaired mobility -DVT/anticoagulation:  Pharmaceutical: Continue Lovenox             -antiplatelet therapy: Aspirin 81 mg daily and Plavix 75 mg daily x90 days then aspirin alone 3. Pain Management: Tylenol as needed 4. Mood: Provide emotional support             -antipsychotic agents: N/A 5. Neuropsych: This patient is not capable of making decisions on his own behalf. 6. Skin/Wound Care: Routine skin checks 7. Fluids/Electrolytes/Nutrition: Routine in and outs with follow-up chemistries 8.  Dysphagia.  Mechanical soft thin liquids..  Patient limited p.o. intake.  Continue Megace to stimulate appetite follow-up speech therapy 9.  Hyperlipidemia. Continue Lipitor. LDL reviewed- 124.  10.  Diabetes mellitus.  Hemoglobin A1c 10.1.  CBGs 143-171: Semglee 10 units daily.  Diabetic teaching. Increase metformin to 250mg  BID with meals. Monitor creatinine.  Recent Labs    10/28/20 1625 10/28/20 2052 10/29/20 0617  GLUCAP 195* 214* 144*  Fair control no change in meds today   11.  Obesity.  BMI 35.37-->34.89.  Dietary follow-up. Discontinue feeding supplement.  12. Sub-optimal potassium: start 12/29/20 daily supplement. Monitor K+/  13. Right arm spasticity: continue Baclofen HS. Will benefit from outpatient Botox.   LOS: 5 days A FACE TO FACE EVALUATION WAS PERFORMED  Cornisha Zetino P Primrose Oler 10/29/2020, 10:08 AM

## 2020-10-29 NOTE — Progress Notes (Signed)
Physical Therapy Session Note  Patient Details  Name: Raymond Kerr MRN: [id number] Date of Birth: Dec 29, 1955  Today's Date: 10/29/2020 PT Individual Time: 1345-1454 PT Individual Time Calculation (min): 69 min   Short Term Goals: Week 1:  PT Short Term Goal 1 (Week 1): Pt will perform bed mobility with max assist of 1. PT Short Term Goal 2 (Week 1): Pt will perform sit<>stand with total A of 1. PT Short Term Goal 3 (Week 1): Pt will initiate gait training PT Short Term Goal 4 (Week 1): Pt will tolerate sitting in WC >2 hours between therapies  Skilled Therapeutic Interventions/Progress Updates:     Pt in TIS w/c at start of session, sleeping soundly. Had difficulty waking up and maintaining alertness once woken up. Per conversation with RN, he has been sleepy all day. Pt wheeled to ortho rehab gym in w/c for time and placed in front of the BITS system to work on visual scanning and cognitive problem solving. All lights turned on and blinds open to assist with improving alertness. Pt continued to fall asleep during instruction of visual scanning and problem solving tasks on the BITS system, despite max verbal cues. Eventually, he was able to participate in some of the visual scanning and user targets by reaching with his LUE but again, required MAX verbal cues for initiation. Avg reaction time around 15.5 seconds per target. Next, completed tracing large letters placed on his R side to promote R attention, drawing with his stronger LUE - required hand-over-hand assist to start initiation and then this progressed to only supervision after the 2nd letter. Attempted to work on problem solving with sequencing tasks with trail making #1-#8 however pt unable to follow instructions for this mildly complex puzzle. Next, focused rest of session on standing for RLE NMR and standing balance in standing frame. Assisted to stand in standing frame with dependant mechanical lift. Once standing in frame, required  minA for standing balance to reduce R pushing. Used mirror for visual feedback for midline awareness. In standing, worked on functional reaching in multiple plans with his stronger LUE to promote L weight shift to reduce R pushing. Also worked on reaching + placing resistive clothespins to target to incorporate [facility] and task sequencing. Pt assisted back to his room in W/c and then back to bed via sliding board +2 totalA transfer - used bedrail to assist with pulling over hips. Required +2 totalA for sit>supine and then +2 assist for repositioning in bed. Remained supine with HOB slightly elevated at end of session, needs in reach, RUE supported with pillow. Pt almost immediately falling back asleep once in the bed. Messaged provided for [facility] to assist with RLE positioning in bed.   Therapy Documentation Precautions:  Precautions Precautions: Fall Precaution Comments: R hemi Restrictions Weight Bearing Restrictions: No General:    Therapy/Group: Individual Therapy  Orrin Brigham 10/29/2020, 7:40 AM

## 2020-10-29 NOTE — Progress Notes (Signed)
Orthopedic Tech Progress Note Patient Details:  Raymond Kerr Jul 05, 1955 [id number]  Called in order to [employer] for a [employer] BOOT   Patient ID: Raymond Kerr, male   DOB: November 07, 1955, 65 y.o.   MRN: [id number]  [name] 10/29/2020, 3:10 PM

## 2020-10-30 LAB — GLUCOSE, CAPILLARY
Glucose-Capillary: 147 mg/dL — ABNORMAL HIGH (ref 70–99)
Glucose-Capillary: 158 mg/dL — ABNORMAL HIGH (ref 70–99)
Glucose-Capillary: 218 mg/dL — ABNORMAL HIGH (ref 70–99)
Glucose-Capillary: 199 mg/dL — ABNORMAL HIGH (ref 70–99)

## 2020-10-30 MED ORDER — BACLOFEN 5 MG HALF TABLET
5.0000 mg | ORAL_TABLET | Freq: Three times a day (TID) | ORAL | Status: DC
  Administered 2020-10-30 – 2020-10-31 (×2): 5 mg via ORAL
  Filled 2020-10-30 (×3): qty 1

## 2020-10-30 MED ORDER — METFORMIN HCL 500 MG PO TABS
500.0000 mg | ORAL_TABLET | Freq: Two times a day (BID) | ORAL | Status: DC
  Administered 2020-10-30 – 2020-10-31 (×2): 500 mg via ORAL
  Filled 2020-10-30 (×2): qty 1

## 2020-10-30 NOTE — Progress Notes (Signed)
PROGRESS NOTE   Subjective/Complaints: More alert and interactive this morning He expresses no complaints No nursing concerns Still with significant right sided tone- will increase baclofen.   ROS: +right arm pain and tightness, +insomnia, limited by aphasia   Objective:   No results found. No results for input(s): WBC, HGB, HCT, PLT in the last 72 hours.  No results for input(s): NA, K, CL, CO2, GLUCOSE, BUN, CREATININE, CALCIUM in the last 72 hours.   Intake/Output Summary (Last 24 hours) at 10/30/2020 1108 Last data filed at 10/30/2020 0700 Gross per 24 hour  Intake 120 ml  Output --  Net 120 ml        Physical Exam: Vital Signs Blood pressure 134/75, pulse 78, temperature (!) 97.3 F (36.3 C), temperature source Oral, resp. rate 19, height 6' (1.829 m), weight 116.7 kg, SpO2 100 %. Gen: no distress, normal appearing HEENT: oral mucosa pink and moist, NCAT Cardio: Reg rate Chest: normal effort, normal rate of breathing Abd: soft, non-distended Ext: no edema Psych: pleasant, normal affect Skin: intact  Neuro:Left eye ptosis, L pupil deviated laterally  No sensation to pinch RUE  Patient is alert.  He does exhibit expressive aphasia.  He does provide some yes no responses and was initially able to state his first name.  He does mimic some motor commands. right side 0/5 strength, left side 5/5. Right elbow flexion MAS 3   Assessment/Plan: 1. Functional deficits which require 3+ hours per day of interdisciplinary therapy in a comprehensive inpatient rehab setting. Physiatrist is providing close team supervision and 24 hour management of active medical problems listed below. Physiatrist and rehab team continue to assess barriers to discharge/monitor patient progress toward functional and medical goals  Care Tool:  Bathing    Body parts bathed by patient: Face   Body parts bathed by helper: Right arm, Left  arm, Buttocks, Right upper leg, Right lower leg, Left lower leg     Bathing assist Assist Level: Set up assist     Upper Body Dressing/Undressing Upper body dressing   What is the patient wearing?: Hospital gown only    Upper body assist Assist Level: Maximal Assistance - Patient 25 - 49%    Lower Body Dressing/Undressing Lower body dressing      What is the patient wearing?: Pants     Lower body assist Assist for lower body dressing: Total Assistance - Patient < 25%     Toileting Toileting    Toileting assist Assist for toileting: 2 Helpers     Transfers Chair/bed transfer  Transfers assist  Chair/bed transfer activity did not occur: Safety/medical concerns  Chair/bed transfer assist level: 2 Helpers (SB transfer)     Locomotion Ambulation   Ambulation assist   Ambulation activity did not occur: Safety/medical concerns          Walk 10 feet activity   Assist  Walk 10 feet activity did not occur: Safety/medical concerns        Walk 50 feet activity   Assist Walk 50 feet with 2 turns activity did not occur: Safety/medical concerns         Walk 150 feet activity   Assist  Walk 150 feet activity did not occur: Safety/medical concerns         Walk 10 feet on uneven surface  activity   Assist Walk 10 feet on uneven surfaces activity did not occur: Safety/medical concerns         Wheelchair     Assist Is the patient using a wheelchair?:  (yes, per PT note) Type of Wheelchair:  (manual per PT note) Wheelchair activity did not occur: Safety/medical concerns         Wheelchair 50 feet with 2 turns activity    Assist    Wheelchair 50 feet with 2 turns activity did not occur: Safety/medical concerns       Wheelchair 150 feet activity     Assist  Wheelchair 150 feet activity did not occur: Safety/medical concerns       Blood pressure 134/75, pulse 78, temperature (!) 97.3 F (36.3 C), temperature source  Oral, resp. rate 19, height 6' (1.829 m), weight 116.7 kg, SpO2 100 %.    Medical Problem List and Plan: 1.  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  Impaired mobility -DVT/anticoagulation:  Pharmaceutical: Continue Lovenox             -antiplatelet therapy: Aspirin 81 mg daily and Plavix 75 mg daily x90 days then aspirin alone 3. Pain Management: Tylenol as needed 4. Mood: Provide emotional support             -antipsychotic agents: N/A 5. Neuropsych: This patient is not capable of making decisions on his own behalf. 6. Skin/Wound Care: Routine skin checks 7. Fluids/Electrolytes/Nutrition: Routine in and outs with follow-up chemistries 8.  Dysphagia.  Mechanical soft thin liquids..  Patient limited p.o. intake.  Continue Megace to stimulate appetite follow-up speech therapy 9.  Hyperlipidemia. Continue Lipitor. LDL reviewed- 124.  10.  Diabetes mellitus.  Hemoglobin A1c 10.1.  CBGs 143-171: Semglee 10 units daily.  Diabetic teaching. Increase metformin to 500 mg BID with meals. Monitor creatinine. Discontinue ISS.  Recent Labs    10/29/20 1730 10/29/20 2049 10/30/20 0551  GLUCAP 184* 151* 147*  Fair control no change in meds today   11.  Obesity.  BMI 35.37-->34.89.  Dietary follow-up. Discontinue feeding supplement.  12. Sub-optimal potassium: start daily supplement. Repeat potassium tomorrow.  13. Right arm spasticity: increase baclofen to 5mg  TID.   LOS: 6 days A FACE TO FACE EVALUATION WAS PERFORMED  Woodie Trusty P Sharina Petre 10/30/2020, 11:08 AM

## 2020-10-30 NOTE — Progress Notes (Signed)
Physical Therapy Session Note  Patient Details  Name: Raymond Kerr MRN: [id number] Date of Birth: 10-Feb-1956  Today's Date: 10/30/2020 PT Co-Treatment Time: 1330-1430 PT Co-Treatment Time Calculation (min): 60 min  Short Term Goals: Week 1:  PT Short Term Goal 1 (Week 1): Pt will perform bed mobility with max assist of 1. PT Short Term Goal 2 (Week 1): Pt will perform sit<>stand with total A of 1. PT Short Term Goal 3 (Week 1): Pt will initiate gait training PT Short Term Goal 4 (Week 1): Pt will tolerate sitting in WC >2 hours between therapies  Skilled Therapeutic Interventions/Progress Updates:      Pt in TIS w/c on arrival, sleeping but awakens to voice. Session completed as Co-Treatment with OT. Time above reflects total time with PT charging the time. Pt wheeled to day room rehab gym and completed squat<>pivot transfer from TIS w/c to mat table with maxA with +2 assist on standby - used over-theb-back Bobath method to facilitate forward weight shift, head/hip relationship, and reduce pushing towards paretic R side. He has great difficulty initiating posterior chain to clear hips during transfer. Once sitting edge of mat, he's able to sit with CGA and then progressing to close supervision. He demonstrates some mild pushing tendencies when he has his RUE on mat table but this decrease once it's placed in his lap. Worked on forward weight shifts while seated to promote hip clearance during transfers. Also worked on targeting reaching towards his stronger LUE to reduce R pushing and trunk elongation on R. Then worked on short sitting to 1/2 squat transitions with +2 modA to facilitate hip clearance and posterior chain activation. Able to progress this to standing with +2 maxA via 3-muskateer technique, monitoring L & R foot placement for positioning. In standing, used mirror for visual feedback for midline awareness and also worked on functional reaching with LUE towards ipsilateral and upward  side to promote thoracic extension and R trunk elongation. Required +2 maxA for standing balance while doing these activities. Assisted back to his w/c via squat<>pivot transfer with +2 maxA via bobath over the back method. Pt returned to his room at conclusion of session, remained seated in TIS with all needs in reach, safety belt alarm on.   Therapy Documentation Precautions:  Precautions Precautions: Fall Precaution Comments: R hemi Restrictions Weight Bearing Restrictions: No General:    Therapy/Group: Co-Treatment  Antinio Sanderfer P Kaisyn Millea PT 10/30/2020, 3:36 PM

## 2020-10-30 NOTE — Progress Notes (Signed)
Speech Language Pathology Daily Session Note  Patient Details  Name: Raymond Kerr MRN: [id number] Date of Birth: 1956/01/01  Today's Date: 10/30/2020 SLP Individual Time: 0800-0900 SLP Individual Time Calculation (min): 60 min  Short Term Goals: Week 1: SLP Short Term Goal 1 (Week 1): Pt will follow 1-step directions for orientation to communication board to increase expressive communication given moderate assist SLP Short Term Goal 2 (Week 1): Pt will imitate a single bilabial sound (m, b, or p) x1/session given maximal verbal, visual (mirror) and tactile cueing SLP Short Term Goal 3 (Week 1): Pt will increase sustained attention to functional tasks to 2-3 minutes with mod A SLP Short Term Goal 4 (Week 1): Pt will tolerate Dys 3/thin diet with minimal s/s aspiration with mod A cues for swallow strategies SLP Short Term Goal 5 (Week 1): Pt will answer simple yes/no questions via verbal/nonverbal responses to communicate wants/needs with mod A  Skilled Therapeutic Interventions:   Patient seen for skilled ST session focusing on aphasia/language goals. Patient was awake and alert when SLP arrived. He was able to point to correct spelling of his first and last name in field of 3 without difficult for accuracy but with cues to initiate pointing. He pointed to pictures in field of 3 when named with 90% accuracy and when function described with 80% accuracy. Without cues, when trying to imitate or produce word with bilabial consonant in beginning, he exhibited repetition of initial phoneme and was not able to transition to next. He was able to imitate to produce bilabials when in medial position with phrases, ie: "little bear". He answered yes/no questions related to immediate wants/needs with questionable accuracy as patient has very flat affect and does not initiate without cues. He performed automatic speech tasks of saying alphabet, requiring  min-mod cues. Patient continues to benefit from skilled  SLP intervention to maximize speech-language and swallow function goals prior to discharge.  Pain Pain Assessment Pain Scale: Faces Pain Score: 0-No pain Faces Pain Scale: No hurt  Therapy/Group: Individual Therapy  Angela Nevin, MA, CCC-SLP Speech Therapy

## 2020-10-30 NOTE — Progress Notes (Signed)
Physical Therapy Session Note  Patient Details  Name: Raymond Kerr MRN: [id number] Date of Birth: 1955/12/30  Today's Date: 10/30/2020 PT Individual Time: 0915-1025 PT Individual Time Calculation (min): 70 min   Short Term Goals: Week 1:  PT Short Term Goal 1 (Week 1): Pt will perform bed mobility with max assist of 1. PT Short Term Goal 2 (Week 1): Pt will perform sit<>stand with total A of 1. PT Short Term Goal 3 (Week 1): Pt will initiate gait training PT Short Term Goal 4 (Week 1): Pt will tolerate sitting in WC >2 hours between therapies  Skilled Therapeutic Interventions/Progress Updates:     Pt supine in bed at start of session with RN at bedside administering morning medications. Pt noted to be incontinent of urine with saturated linen and brief. Bed level brief change and maxA +2 for rolling to his R and totalA +2 for rolling to his L. Donned TED's and hospital socks at dependant level for time. Assisted to EOB with maxA of 1 person with +2 assist on standby. Able to sit EOB with minA, progressing to close supervision after a few minutes. No pushing in static sitting. Noted pants not be full over hips - performed several bouts of lateral leans in sitting with maxA for facilitation and totalA for managing pants. Next, completed sliding board transfer with +2 totalA to his TIS w/c. Required facilitation for forward weight shift and sequencing of steps during transfer - noted some minimal improvement in pt participation and understanding of instructions. He requires totalA +2 for repositioning in the TIS w/c. Pt then wheeled to main rehab gym. Attempted to perform sit<>stands to [facility] with mirror for visual support but despite totalA +2, pt unable to rise to stand. Question pushing into extension during sit<>stand transfer. Attempted a few times with different techniques to reduce pushing but still no success. Focused remainder of session on gentle, static, prolonged stretching to RLE and RUE  to significant tone with pt sitting in TIS w/c. Targeted heel cord complex, hamstring, hip abd/add, biceps, forearm, and pectorals. Stretching to tolerance. Pt returned to his room at conclusion of session, remained seated in TIS w/c with safety belt alarm on and needs within reach.  Therapy Documentation Precautions:  Precautions Precautions: Fall Precaution Comments: R hemi Restrictions Weight Bearing Restrictions: No General:    Therapy/Group: Individual Therapy  Orrin Brigham 10/30/2020, 7:31 AM

## 2020-10-31 LAB — CBC
HCT: 41.8 % (ref 39.0–52.0)
Hemoglobin: 13.9 g/dL (ref 13.0–17.0)
MCH: 29 pg (ref 26.0–34.0)
MCHC: 33.3 g/dL (ref 30.0–36.0)
MCV: 87.1 fL (ref 80.0–100.0)
Platelets: 299 10*3/uL (ref 150–400)
RBC: 4.8 MIL/uL (ref 4.22–5.81)
RDW: 14.1 % (ref 11.5–15.5)
WBC: 6.2 10*3/uL (ref 4.0–10.5)
nRBC: 0 % (ref 0.0–0.2)

## 2020-10-31 LAB — GLUCOSE, CAPILLARY
Glucose-Capillary: 157 mg/dL — ABNORMAL HIGH (ref 70–99)
Glucose-Capillary: 174 mg/dL — ABNORMAL HIGH (ref 70–99)
Glucose-Capillary: 161 mg/dL — ABNORMAL HIGH (ref 70–99)
Glucose-Capillary: 186 mg/dL — ABNORMAL HIGH (ref 70–99)

## 2020-10-31 LAB — BASIC METABOLIC PANEL
Anion gap: 8 (ref 5–15)
BUN: 20 mg/dL (ref 8–23)
CO2: 19 mmol/L — ABNORMAL LOW (ref 22–32)
Calcium: 9.4 mg/dL (ref 8.9–10.3)
Chloride: 107 mmol/L (ref 98–111)
Creatinine, Ser: 1.09 mg/dL (ref 0.61–1.24)
GFR, Estimated: >60 mL/min (ref >60)
Glucose, Bld: 171 mg/dL — ABNORMAL HIGH (ref 70–99)
Potassium: 4 mmol/L (ref 3.5–5.1)
Sodium: 134 mmol/L — ABNORMAL LOW (ref 135–145)

## 2020-10-31 MED ORDER — METFORMIN HCL 500 MG PO TABS
750.0000 mg | ORAL_TABLET | Freq: Two times a day (BID) | ORAL | Status: DC
  Administered 2020-10-31 – 2020-11-04 (×8): 750 mg via ORAL
  Filled 2020-10-31 (×8): qty 2

## 2020-10-31 MED ORDER — BACLOFEN 10 MG PO TABS
10.0000 mg | ORAL_TABLET | Freq: Three times a day (TID) | ORAL | Status: DC
  Administered 2020-10-31 – 2020-11-02 (×6): 10 mg via ORAL
  Filled 2020-10-31 (×6): qty 1

## 2020-10-31 NOTE — Progress Notes (Signed)
PROGRESS NOTE   Subjective/Complaints: Alert, speech limited by aphasia Still very tight RUE- increase baclofen to 10mg  TID Participating with therapy   ROS: +right arm pain and tightness, +insomnia, limited by aphasia   Objective:   No results found. Recent Labs    10/31/20 0611  WBC 6.2  HGB 13.9  HCT 41.8  PLT 299    Recent Labs    10/31/20 0611  NA 134*  K 4.0  CL 107  CO2 19*  GLUCOSE 171*  BUN 20  CREATININE 1.09  CALCIUM 9.4     Intake/Output Summary (Last 24 hours) at 10/31/2020 1208 Last data filed at 10/31/2020 0846 Gross per 24 hour  Intake 297 ml  Output --  Net 297 ml        Physical Exam: Vital Signs Blood pressure (!) 143/85, pulse 85, temperature 98.1 F (36.7 C), resp. rate 18, height 6' (1.829 m), weight 116.7 kg, SpO2 100 %. Gen: no distress, normal appearing, BMI 34.89 HEENT: oral mucosa pink and moist, NCAT Cardio: Reg rate Chest: normal effort, normal rate of breathing Abd: soft, non-distended Ext: no edema Psych: pleasant, flat affect  Neuro:Left eye ptosis, L pupil deviated laterally  No sensation to pinch RUE  Patient is alert.  He does exhibit expressive aphasia.  He does provide some yes no responses and was initially able to state his first name.  He does mimic some motor commands. right side 0/5 strength, left side 5/5. Right elbow flexion MAS 3   Assessment/Plan: 1. Functional deficits which require 3+ hours per day of interdisciplinary therapy in a comprehensive inpatient rehab setting. Physiatrist is providing close team supervision and 24 hour management of active medical problems listed below. Physiatrist and rehab team continue to assess barriers to discharge/monitor patient progress toward functional and medical goals  Care Tool:  Bathing    Body parts bathed by patient: Face   Body parts bathed by helper: Right arm, Left arm, Buttocks, Right upper leg,  Right lower leg, Left lower leg     Bathing assist Assist Level: Set up assist     Upper Body Dressing/Undressing Upper body dressing   What is the patient wearing?: Hospital gown only    Upper body assist Assist Level: Maximal Assistance - Patient 25 - 49%    Lower Body Dressing/Undressing Lower body dressing      What is the patient wearing?: Pants     Lower body assist Assist for lower body dressing: Total Assistance - Patient < 25%     Toileting Toileting    Toileting assist Assist for toileting: 2 Helpers     Transfers Chair/bed transfer  Transfers assist  Chair/bed transfer activity did not occur: Safety/medical concerns  Chair/bed transfer assist level: 2 Helpers (SB transfer)     Locomotion Ambulation   Ambulation assist   Ambulation activity did not occur: Safety/medical concerns          Walk 10 feet activity   Assist  Walk 10 feet activity did not occur: Safety/medical concerns        Walk 50 feet activity   Assist Walk 50 feet with 2 turns activity did not occur: Safety/medical  concerns         Walk 150 feet activity   Assist Walk 150 feet activity did not occur: Safety/medical concerns         Walk 10 feet on uneven surface  activity   Assist Walk 10 feet on uneven surfaces activity did not occur: Safety/medical concerns         Wheelchair     Assist Is the patient using a wheelchair?:  (yes, per PT note) Type of Wheelchair:  (manual per PT note) Wheelchair activity did not occur: Safety/medical concerns         Wheelchair 50 feet with 2 turns activity    Assist    Wheelchair 50 feet with 2 turns activity did not occur: Safety/medical concerns       Wheelchair 150 feet activity     Assist  Wheelchair 150 feet activity did not occur: Safety/medical concerns       Blood pressure (!) 143/85, pulse 85, temperature 98.1 F (36.7 C), resp. rate 18, height 6' (1.829 m), weight 116.7 kg,  SpO2 100 %.    Medical Problem List and Plan: 1.  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  Impaired mobility -DVT/anticoagulation:  Pharmaceutical: Continue Lovenox             -antiplatelet therapy: Aspirin 81 mg daily and Plavix 75 mg daily x90 days then aspirin alone 3. Pain Management: Tylenol as needed 4. Mood: Provide emotional support             -antipsychotic agents: N/A 5. Neuropsych: This patient is not capable of making decisions on his own behalf. 6. Skin/Wound Care: Routine skin checks 7. Fluids/Electrolytes/Nutrition: Routine in and outs with follow-up chemistries 8.  Dysphagia.  Mechanical soft thin liquids..  Patient limited p.o. intake.  Continue Megace to stimulate appetite follow-up speech therapy 9.  Hyperlipidemia. Continue Lipitor. LDL reviewed- 124.  10.  Diabetes mellitus.  Hemoglobin A1c 10.1.  CBGs 143-171: Semglee 10 units daily.  Diabetic teaching. Increase metformin to 750 mg BID with meals on 9/9. Monitor creatinine. Discontinue ISS.  Recent Labs    10/30/20 2115 10/31/20 0618 10/31/20 1128  GLUCAP 199* 157* 174*    11.  Obesity.  BMI 35.37-->34.89.  Dietary follow-up. Discontinue feeding supplement.  12. Sub-optimal potassium: start daily supplement. Repeat potassium tomorrow.  13. Right arm spasticity: Increase baclofen to 10mg  TID.   LOS: 7 days A FACE TO FACE EVALUATION WAS PERFORMED  Chloie Loney P Keturah Yerby 10/31/2020, 12:08 PM

## 2020-10-31 NOTE — Progress Notes (Signed)
Speech Language Pathology Weekly Progress and Session Note  Patient Details  Name: Raymond Kerr MRN: [id number] Date of Birth: 06-Sep-1955  Beginning of progress report period:  10/25/2020 End of progress report period:  10/31/2020  Today's Date: 10/31/2020 SLP Individual Time: 1000-1100 SLP Individual Time Calculation (min): 60 min  Short Term Goals: Week 1: SLP Short Term Goal 1 (Week 1): Pt will follow 1-step directions for orientation to communication board to increase expressive communication given moderate assist SLP Short Term Goal 1 - Progress (Week 1): Not met SLP Short Term Goal 2 (Week 1): Pt will imitate a single bilabial sound (m, b, or p) x1/session given maximal verbal, visual (mirror) and tactile cueing SLP Short Term Goal 2 - Progress (Week 1): Met SLP Short Term Goal 3 (Week 1): Pt will increase sustained attention to functional tasks to 2-3 minutes with mod A SLP Short Term Goal 3 - Progress (Week 1): Met SLP Short Term Goal 4 (Week 1): Pt will tolerate Dys 3/thin diet with minimal s/s aspiration with mod A cues for swallow strategies SLP Short Term Goal 4 - Progress (Week 1): Progressing toward goal SLP Short Term Goal 5 (Week 1): Pt will answer simple yes/no questions via verbal/nonverbal responses to communicate wants/needs with mod A SLP Short Term Goal 5 - Progress (Week 1): Progressing toward goal    New Short Term Goals: Week 2: SLP Short Term Goal 1 (Week 2): Patient will tolerate current diet of Dys 3 solids, thin liquids with modA cues swallow safety and clearing oral cavity. SLP Short Term Goal 2 (Week 2): Patient will imitate at 2-3 syllable word level with mod-maxA cues to initiate and achieve adequate voicing. SLP Short Term Goal 3 (Week 2): Patient will point to identify object pictures/photos in field of 3-4 with 80% accuracy and modA cues. SLP Short Term Goal 4 (Week 2): Patient will imitate to produce words with initial bilabial (/p, b/) consonants with  modA cues. SLP Short Term Goal 5 (Week 2): Patient will demonstrate ability to use basic level 4-6 cell communication board to request during structured tasks and/or for basic needs with modA cues. SLP Short Term Goal 6 (Week 2): Patient will initiate to perform tasks such as brushing teeth, feeding self, etc. with mod A cues.  Weekly Progress Updates:  Patient made a little progress, meeting 2/6 STG's and progressing with others. He continues to require significant amount of cues to initiate and maintain active participation (holding utensil, etc) and currently is only verbalizing when cued for automatic speech, imitation, attempts at naming. Patient is total A for self feeding but tolerating Dys 3 solids, thin liquids diet.    Intensity: Minumum of 1-2 x/day, 30 to 90 minutes Frequency: 3 to 5 out of 7 days Duration/Length of Stay: 10/1 Treatment/Interventions: Cognitive remediation/compensation;Dysphagia/aspiration precaution training;Internal/external aids;Speech/Language facilitation;Therapeutic Activities;Cueing hierarchy;Functional tasks;Multimodal communication approach;Patient/family education;Therapeutic Exercise   Daily Session  Skilled Therapeutic Interventions: Patient seen for skilled ST session focusing on speech-language goals. Patient was less vocal/verbal today than previous date but did participate and maintain adequate alertness. He required maxA cues to initiate and maintain holding toothbrush and brushing teeth. He participated in automatic speech task of saying alphabet, requiring modA cues to initiate. Once initiated, he would perseverate until redirected. Patient unable to name any objects but did imitate to produce one and two syllable words. He pointed to words in field of three to match to object drawings with less than 50% accuracy and favoring words on left side  of page. Patient continues to benefit from skilled SLP intervention to maximize language and swallow function  prior to discharge.    General    Pain Pain Assessment Pain Scale: Faces Faces Pain Scale: No hurt  Therapy/Group: Individual Therapy  Sonia Baller, MA, CCC-SLP Speech Therapy

## 2020-10-31 NOTE — Progress Notes (Signed)
Occupational Therapy Weekly Progress Note  Patient Details  Name: Raymond Kerr MRN: [id number] Date of Birth: [birth year]  Beginning of progress report period: October 25, 2020 End of progress report period: October 31, 2020  Today's Date: 10/31/2020 OT Individual Time: 1435-1535 OT Individual Time Calculation (min): 60 min    Patient has met 0 of 4 short term goals.  Mr. Hyson is making slow progress with OT at this time.  Currently, he continues to need overall total assist to total +2 for LB selfcare bed level.  UB bathing is at mod assist level with UB dressing at max assist level sitting unsupported EOB.  He is able to maintain static sitting balance with supervision and dynamic with min to mod assist.  RUE function is still at Brunnstrum stage II in the arm and stage I in the hand.  Increased flexor tone is noted in the elbow, which increases with demand on his posture as well as with transitional movements.  He continues with increased pusher tendencies to the right with transfers and sit to stand.  Total +2 (pt 20%) is needed for scoot pivot transfers using Bobath Method as well as total +2 (pt 20%) for sit to stand and static standing balance.  Decreased initiation of movement is noted with transfers as well with decreased activation of trunk flexion to initiate the movement.  Expressive and receptive deficits are still present as well with inconsistent responses noted with simple "yes/no" questions.  Feel he will benefit from continued CIR level therapy at this time based on the severity of his deficits.    Patient continues to demonstrate the following deficits: muscle weakness, muscle joint tightness, and muscle paralysis, impaired timing and sequencing, abnormal tone, unbalanced muscle activation, and decreased coordination, decreased midline orientation and decreased motor planning, decreased initiation, decreased attention, and delayed processing, and decreased sitting balance,  decreased standing balance, decreased postural control, hemiplegia, and decreased balance strategies and therefore will continue to benefit from skilled OT intervention to enhance overall performance with BADL and Reduce care partner burden.  Patient progressing toward long term goals..  Continue plan of care.  OT Short Term Goals Week 2:  OT Short Term Goal 1 (Week 2): Pt will demonstrate improved initiation with leaning forward and using left hand on bar of stedy to move to stand with max A of 1. OT Short Term Goal 2 (Week 2): Pt will demonstrate improved sit balance to sit EOB with min A while bathing UB with min a. OT Short Term Goal 3 (Week 2): Pt will engage in RUE self ROM with mod A. OT Short Term Goal 4 (Week 2): Pt will don tshirt with mod A.  Skilled Therapeutic Interventions/Progress Updates:    Pt in bed to start session resting.  He was able to awaken with increased time with focus on donning his pants to start.  Total assist +2 (pt 25%) for rolling to the left side in bed with mod assist for rolling to the right.  Total assist to donn over his feet and for pulling up over his hips.  He was able to then transfer to sitting EOB with total assist +2 (pt 25%).  Initial sitting balance was at mod assist secondary to posterior bias but progressed to supervision level.  Therapist donned his shoes at total assist with total +2 (pt 20%) needed for transfer to the wheelchair using Bobath method to the right.  He was transported to the therapy gym and then transferred onto the  mat at the same previously stated level.  Worked on sit to stand and standing balance from EOM with it slightly elevated.  Total +2 (pt 20%) for sit to stand using three muskateers technique for several intervals.  Pushing to the right fluctuated from minimal to moderate.  He was able to tolerate standing for intervals of 3-4 mins with use of a mirror for feedback.  Worked on small side to side weightshifts briefly with emphasis  on shifting to the left to decrease pushing.  Mod facilitation needed to maintain trunk and cervical extension as over time he would relax into flexion.  Decreased ability to support weight through the RLE and at times, exhibiting a slight flexor withdrawal with some dorsiflexion.  He was able to take a few small pre-gait steps with the LLE when weightshifted enough over to the RLE.  Max facilitation needed at the left knee for stabilization with  facilitation through the trunk to promote upright alignment in preparation for weightbearing.  Decreased ability to efficiently unweight the LLE however overall.  Finished session with scoot pivot transfer to the wheelchair on the left Bobath method with total assist +2 (pt 20%).  He was returned to the room where he remained up in the wheelchair with the call button and phone in reach.  Safety belt in place and RUE supported on pillows.    Therapy Documentation Precautions:  Precautions Precautions: Fall Precaution Comments: R hemi Restrictions Weight Bearing Restrictions: No  Pain: Pain Assessment Pain Scale: Faces Pain Score: 0-No pain Faces Pain Scale: No hurt ADL: See Care Tool Section for some details of mobility and selfcare  Therapy/Group: Individual Therapy  Deyon Chizek OTR/L 10/31/2020, 5:21 PM

## 2020-10-31 NOTE — Progress Notes (Signed)
Physical Therapy Session Note  Patient Details  Name: Raymond Kerr MRN: [id number] Date of Birth: 1955-07-30  Today's Date: 10/31/2020 PT Individual Time: 0800-0915 PT Individual Time Calculation (min): 75 min   Short Term Goals: Week 1:  PT Short Term Goal 1 (Week 1): Pt will perform bed mobility with max assist of 1. PT Short Term Goal 2 (Week 1): Pt will perform sit<>stand with total A of 1. PT Short Term Goal 3 (Week 1): Pt will initiate gait training PT Short Term Goal 4 (Week 1): Pt will tolerate sitting in WC >2 hours between therapies  Skilled Therapeutic Interventions/Progress Updates:    Pt asleep on arrival, initially difficult to rouse but awoke with lights on in room and therapist and NT speaking to him. NT present to assist pt with eating breakfast. Therapist was without +2 assist, so session focused on sitting balance while eating breakfast. Pt was able to maintain upright sitting for several minutes at a time, requiring rest breaks leaning against therapist. Slight push/lean to R side, but able to maintain upright posture. Pt occ participated in self feeding, and demoed posterior lean when using L arm to eat. Pt able to articulate short words and phrases, including "yes please" at times. Pt directed in anterior weight shift for improved transfers, with facilitation for improved pelvic positioning for anterior reach. Pt performed reach toward chair positioned in front of pt x 5 before becoming fatigued. Pt returned to bed with max A and was left with all needs in reach and alarm active.   Therapy Documentation Precautions:  Precautions Precautions: Fall Precaution Comments: R hemi Restrictions Weight Bearing Restrictions: No     Therapy/Group: Individual Therapy  Juluis Rainier 10/31/2020, 7:52 PM

## 2020-11-01 DIAGNOSIS — G8111 Spastic hemiplegia affecting right dominant side: Secondary | ICD-10-CM

## 2020-11-01 LAB — GLUCOSE, CAPILLARY
Glucose-Capillary: 145 mg/dL — ABNORMAL HIGH (ref 70–99)
Glucose-Capillary: 133 mg/dL — ABNORMAL HIGH (ref 70–99)
Glucose-Capillary: 138 mg/dL — ABNORMAL HIGH (ref 70–99)
Glucose-Capillary: 136 mg/dL — ABNORMAL HIGH (ref 70–99)

## 2020-11-01 NOTE — Progress Notes (Signed)
Speech Language Pathology Daily Session Note  Patient Details  Name: Raymond Kerr MRN: [id number] Date of Birth: [birth year]  Today's Date: 11/01/2020 SLP Individual Time: 1118-1200 SLP Individual Time Calculation (min): 42 min  Short Term Goals: Week 2: SLP Short Term Goal 1 (Week 2): Patient will tolerate current diet of Dys 3 solids, thin liquids with modA cues swallow safety and clearing oral cavity. SLP Short Term Goal 2 (Week 2): Patient will imitate at 2-3 syllable word level with mod-maxA cues to initiate and achieve adequate voicing. SLP Short Term Goal 3 (Week 2): Patient will point to identify object pictures/photos in field of 3-4 with 80% accuracy and modA cues. SLP Short Term Goal 4 (Week 2): Patient will imitate to produce words with initial bilabial (/p, b/) consonants with modA cues. SLP Short Term Goal 5 (Week 2): Patient will demonstrate ability to use basic level 4-6 cell communication board to request during structured tasks and/or for basic needs with modA cues. SLP Short Term Goal 6 (Week 2): Patient will initiate to perform tasks such as brushing teeth, feeding self, etc. with mod A cues.  Skilled Therapeutic Interventions: Pt seen for skilled ST with focus on communication and swallowing goals. Pt received in bed sleeping soundly, roused with extra time and HOB raised to maintain alertness. Pt agreeable to thin liquids via straw, seen with single and consecutive sips with no overt s/s aspiration or difficulty. During 1 trial, pt becoming distracted and benefiting from verbal cue to swallow bolus which was effective. Recommend diet change to allow straws with thin liquids at this time. SLP facilitating automatic speech tasks by providing max A cues to attempt to initiate (ABCs, counting 1-10, singing happy birthday) however pt unable to elicit any automatic speech this date. SLP attempting to utilize yes/no communication board however pt unable to initiate pointing or  significant eye gaze despite max cues including hand over hand assist. Pt able to imitate monosyllabic word on 1/15 trials with max A. Pt producing single voiced "ahh" provided max cues and imitation. Pt remains very flat during tx session tracking SLP with eye, however did laugh a few times in response to SLP! Still mostly shakes head "no" to questions, appears to mumble "mmm" for affirmative response at times. Pt left in bed with alarm set and all needs within reach. Cont ST POC.   Pain Pain Assessment Pain Scale: Faces Faces Pain Scale: No hurt Pain Intervention(s): Repositioned  Therapy/Group: Individual Therapy  Tacey Ruiz 11/01/2020, 11:52 AM

## 2020-11-01 NOTE — Progress Notes (Signed)
Physical Therapy Session Note  Patient Details  Name: Raymond Kerr MRN: [id number] Date of Birth: Dec 18, 1955  Today's Date: 11/01/2020 PT Individual Time: 6226-3335 PT Individual Time Calculation (min): 55 min   Short Term Goals: Week 1:  PT Short Term Goal 1 (Week 1): Pt will perform bed mobility with max assist of 1. PT Short Term Goal 2 (Week 1): Pt will perform sit<>stand with total A of 1. PT Short Term Goal 3 (Week 1): Pt will initiate gait training PT Short Term Goal 4 (Week 1): Pt will tolerate sitting in WC >2 hours between therapies   Skilled Therapeutic Interventions/Progress Updates:   Pt received supine in bed. PT performed gentle PROM/AAROM on the RLE for hip/knee flexion/extension in synergy. Able to increase Rhip flexion to ~95 deg from ~60 deg initially. Noted to have been incontinent of bladder. PT instructed pt in supine>sit with total A through log roll on the L side of the bed. Sitting balance EOB mod assist initially fading into supervision assist x 5 minutes. Sit<>stand x 5 time in stedy with total-max assist from PT to facilitate anterior weight shift from in front of patient and then max assist to prevent LOB to the R in standing and facilitate weight shift to the R once in standing. Pt performed pericare sitting on Stedy with min assist to improve midline to the R. PT performed all clothing management throughout session while pt maintaining UE support on the L side with instruction to prevent pushers response without only mild improvement with weight shift to the L. Sit>supine completed with total A to trunk and BLE, and left supine in bed with call bell in reach and all needs met.        Therapy Documentation Precautions:  Precautions Precautions: Fall Precaution Comments: R hemi Restrictions Weight Bearing Restrictions: No  Pain: Pain Assessment Pain Scale: Faces Faces Pain Scale: Hurts a little bit Pain Intervention(s): Repositioned   Therapy/Group:  Individual Therapy  Lorie Phenix 11/01/2020, 10:04 AM

## 2020-11-01 NOTE — Progress Notes (Signed)
PROGRESS NOTE   Subjective/Complaints: Lying in bed. Appears a little restless. No problems reported by nursing.   ROS: limited due to language/communication    Objective:   No results found. Recent Labs    10/31/20 0611  WBC 6.2  HGB 13.9  HCT 41.8  PLT 299    Recent Labs    10/31/20 0611  NA 134*  K 4.0  CL 107  CO2 19*  GLUCOSE 171*  BUN 20  CREATININE 1.09  CALCIUM 9.4     Intake/Output Summary (Last 24 hours) at 11/01/2020 1104 Last data filed at 11/01/2020 0900 Gross per 24 hour  Intake 120 ml  Output --  Net 120 ml        Physical Exam: Vital Signs Blood pressure 96/63, pulse 89, temperature 98.6 F (37 C), resp. rate 18, height 6' (1.829 m), weight 116.7 kg, SpO2 100 %. Constitutional: No distress . Vital signs reviewed. obese HEENT: NCAT, EOMI, oral membranes moist Neck: supple Cardiovascular: RRR without murmur. No JVD    Respiratory/Chest: CTA Bilaterally without wheezes or rales. Normal effort    GI/Abdomen: BS +, non-tender, non-distended Ext: no clubbing, cyanosis, or edema Psych: a little restless cooperative  Neuro:Left eye ptosis, L pupil deviated laterally  No sensation to pinch RUE  Patient is alert.  He does exhibit expressive aphasia.  He does provide some yes no responses and was initially able to state his first name.  He does mimic some motor commands. right side 0/5 strength, left side 5/5. Right elbow flexion still approx MAS 2-3   Assessment/Plan: 1. Functional deficits which require 3+ hours per day of interdisciplinary therapy in a comprehensive inpatient rehab setting. Physiatrist is providing close team supervision and 24 hour management of active medical problems listed below. Physiatrist and rehab team continue to assess barriers to discharge/monitor patient progress toward functional and medical goals  Care Tool:  Bathing    Body parts bathed by patient: Face    Body parts bathed by helper: Right arm, Left arm, Buttocks, Right upper leg, Right lower leg, Left lower leg     Bathing assist Assist Level: Set up assist     Upper Body Dressing/Undressing Upper body dressing   What is the patient wearing?: Hospital gown only    Upper body assist Assist Level: Maximal Assistance - Patient 25 - 49%    Lower Body Dressing/Undressing Lower body dressing      What is the patient wearing?: Pants     Lower body assist Assist for lower body dressing: Total Assistance - Patient < 25% (supine in bed)     Toileting Toileting    Toileting assist Assist for toileting: 2 Helpers     Transfers Chair/bed transfer  Transfers assist  Chair/bed transfer activity did not occur: Safety/medical concerns  Chair/bed transfer assist level: 2 Helpers (scoot pivot)     Locomotion Ambulation   Ambulation assist   Ambulation activity did not occur: Safety/medical concerns          Walk 10 feet activity   Assist  Walk 10 feet activity did not occur: Safety/medical concerns        Walk 50 feet activity  Assist Walk 50 feet with 2 turns activity did not occur: Safety/medical concerns         Walk 150 feet activity   Assist Walk 150 feet activity did not occur: Safety/medical concerns         Walk 10 feet on uneven surface  activity   Assist Walk 10 feet on uneven surfaces activity did not occur: Safety/medical concerns         Wheelchair     Assist Is the patient using a wheelchair?:  (yes, per PT note) Type of Wheelchair:  (manual per PT note) Wheelchair activity did not occur: Safety/medical concerns         Wheelchair 50 feet with 2 turns activity    Assist    Wheelchair 50 feet with 2 turns activity did not occur: Safety/medical concerns       Wheelchair 150 feet activity     Assist  Wheelchair 150 feet activity did not occur: Safety/medical concerns       Blood pressure 96/63, pulse  89, temperature 98.6 F (37 C), resp. rate 18, height 6' (1.829 m), weight 116.7 kg, SpO2 100 %.    Medical Problem List and Plan: 1.  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  Impaired mobility -DVT/anticoagulation:  Pharmaceutical: Continue Lovenox             -antiplatelet therapy: Aspirin 81 mg daily and Plavix 75 mg daily x90 days then aspirin alone 3. Pain Management: Tylenol as needed 4. Mood: Provide emotional support             -antipsychotic agents: N/A 5. Neuropsych: This patient is not capable of making decisions on his own behalf. 6. Skin/Wound Care: Routine skin checks 7. Fluids/Electrolytes/Nutrition: Routine in and outs with follow-up chemistries 8.  Dysphagia.  Mechanical soft thin liquids..  Patient limited p.o. intake.  Continue Megace to stimulate appetite follow-up speech therapy 9.  Hyperlipidemia. Continue Lipitor. LDL reviewed- 124.  10.  Diabetes mellitus.  Hemoglobin A1c 10.1.  CBGs 143-171: Semglee 10 units daily.  Diabetic teaching. Increase metformin to 750 mg BID with meals on 9/9. Monitor creatinine. Discontinue ISS.  Recent Labs    10/31/20 1629 10/31/20 2145 11/01/20 0606  GLUCAP 161* 186* 145*   -9/10 cbg's controlled  11.  Obesity.  BMI 35.37-->34.89.  Dietary follow-up. Discontinue feeding supplement.  12. Sub-optimal potassium: start daily supplement. Repeat potassium tomorrow.  13. Right arm spasticity: Increased baclofen to 10mg  TID on 9/9--observe for effect. Continue rom, splinting, etc   LOS: 8 days A FACE TO FACE EVALUATION WAS PERFORMED  11/01/2020, 11:04 AM

## 2020-11-02 DIAGNOSIS — E669 Obesity, unspecified: Secondary | ICD-10-CM

## 2020-11-02 DIAGNOSIS — E1169 Type 2 diabetes mellitus with other specified complication: Secondary | ICD-10-CM

## 2020-11-02 LAB — GLUCOSE, CAPILLARY
Glucose-Capillary: 118 mg/dL — ABNORMAL HIGH (ref 70–99)
Glucose-Capillary: 126 mg/dL — ABNORMAL HIGH (ref 70–99)
Glucose-Capillary: 122 mg/dL — ABNORMAL HIGH (ref 70–99)
Glucose-Capillary: 142 mg/dL — ABNORMAL HIGH (ref 70–99)

## 2020-11-02 MED ORDER — BACLOFEN 10 MG PO TABS
10.0000 mg | ORAL_TABLET | Freq: Four times a day (QID) | ORAL | Status: DC
  Administered 2020-11-02 – 2020-11-04 (×7): 10 mg via ORAL
  Filled 2020-11-02 (×8): qty 1

## 2020-11-02 NOTE — Progress Notes (Signed)
PROGRESS NOTE   Subjective/Complaints: No problems reported overnight. Appears generally comfortable  ROS: limited due to language/communication    Objective:   No results found. Recent Labs    10/31/20 0611  WBC 6.2  HGB 13.9  HCT 41.8  PLT 299    Recent Labs    10/31/20 0611  NA 134*  K 4.0  CL 107  CO2 19*  GLUCOSE 171*  BUN 20  CREATININE 1.09  CALCIUM 9.4     Intake/Output Summary (Last 24 hours) at 11/02/2020 0908 Last data filed at 11/02/2020 0825 Gross per 24 hour  Intake 320 ml  Output --  Net 320 ml        Physical Exam: Vital Signs Blood pressure 137/83, pulse 87, temperature 98.1 F (36.7 C), resp. rate 18, height 6' (1.829 m), weight 116.7 kg, SpO2 96 %. Constitutional: No distress . Vital signs reviewed. HEENT: NCAT, EOMI, oral membranes moist Neck: supple Cardiovascular: RRR without murmur. No JVD    Respiratory/Chest: CTA Bilaterally without wheezes or rales. Normal effort    GI/Abdomen: BS +, non-tender, non-distended Ext: no clubbing, cyanosis, or edema Psych: pleasant and cooperative  Neuro:Left eye ptosis, L pupil deviated laterally  No sensation to pinch RUE  Patient is generally alert.  expressive aphasia.  He does provide some yes no responses and was initially able to state his first name.  He does mimic some motor commands. right side 0/5 strength, left side 5/5. RUE flexor tone, approx MAS 2-3 still   Assessment/Plan: 1. Functional deficits which require 3+ hours per day of interdisciplinary therapy in a comprehensive inpatient rehab setting. Physiatrist is providing close team supervision and 24 hour management of active medical problems listed below. Physiatrist and rehab team continue to assess barriers to discharge/monitor patient progress toward functional and medical goals  Care Tool:  Bathing    Body parts bathed by patient: Face   Body parts bathed by  helper: Right arm, Left arm, Buttocks, Right upper leg, Right lower leg, Left lower leg     Bathing assist Assist Level: Set up assist     Upper Body Dressing/Undressing Upper body dressing   What is the patient wearing?: Hospital gown only    Upper body assist Assist Level: Maximal Assistance - Patient 25 - 49%    Lower Body Dressing/Undressing Lower body dressing      What is the patient wearing?: Pants     Lower body assist Assist for lower body dressing: Total Assistance - Patient < 25% (supine in bed)     Toileting Toileting    Toileting assist Assist for toileting: 2 Helpers     Transfers Chair/bed transfer  Transfers assist  Chair/bed transfer activity did not occur: Safety/medical concerns  Chair/bed transfer assist level: 2 Helpers (scoot pivot)     Locomotion Ambulation   Ambulation assist   Ambulation activity did not occur: Safety/medical concerns          Walk 10 feet activity   Assist  Walk 10 feet activity did not occur: Safety/medical concerns        Walk 50 feet activity   Assist Walk 50 feet with 2 turns activity  did not occur: Safety/medical concerns         Walk 150 feet activity   Assist Walk 150 feet activity did not occur: Safety/medical concerns         Walk 10 feet on uneven surface  activity   Assist Walk 10 feet on uneven surfaces activity did not occur: Safety/medical concerns         Wheelchair     Assist Is the patient using a wheelchair?:  (yes, per PT note) Type of Wheelchair:  (manual per PT note) Wheelchair activity did not occur: Safety/medical concerns         Wheelchair 50 feet with 2 turns activity    Assist    Wheelchair 50 feet with 2 turns activity did not occur: Safety/medical concerns       Wheelchair 150 feet activity     Assist  Wheelchair 150 feet activity did not occur: Safety/medical concerns       Blood pressure 137/83, pulse 87, temperature 98.1  F (36.7 C), resp. rate 18, height 6' (1.829 m), weight 116.7 kg, SpO2 96 %.    Medical Problem List and Plan: 1.  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  Impaired mobility -DVT/anticoagulation:  Pharmaceutical: Continue Lovenox             -antiplatelet therapy: Aspirin 81 mg daily and Plavix 75 mg daily x90 days then aspirin alone 3. Pain Management: Tylenol as needed 4. Mood: Provide emotional support             -antipsychotic agents: N/A 5. Neuropsych: This patient is not capable of making decisions on his own behalf. 6. Skin/Wound Care: Routine skin checks 7. Fluids/Electrolytes/Nutrition: Routine in and outs with follow-up chemistries 8.  Dysphagia.  Mechanical soft thin liquids..  Patient limited p.o. intake.  Continue Megace to stimulate appetite follow-up speech therapy 9.  Hyperlipidemia. Continue Lipitor. LDL reviewed- 124.  10.  Diabetes mellitus.  Hemoglobin A1c 10.1.  CBGs 143-171: Semglee 10 units daily.  Diabetic teaching. Increase metformin to 750 mg BID with meals on 9/9. Monitor creatinine. Discontinue ISS.  Recent Labs    11/01/20 1801 11/01/20 2047 11/02/20 0609  GLUCAP 138* 136* 118*   -9/11 cbg's controlled  11.  Obesity.  BMI 35.37-->34.89.  Dietary follow-up. Discontinue feeding supplement.  12. Sub-optimal potassium: start daily supplement. Repeat potassium tomorrow.  13. Right arm spasticity: Increased baclofen to 10mg  TID on 9/9  -still with significant tone  -increase baclofen to 10mg  qid  -continue splinting, ROM LOS: 9 days A FACE TO FACE EVALUATION WAS PERFORMED  11/9 11/02/2020, 9:08 AM

## 2020-11-02 NOTE — Progress Notes (Signed)
Speech Language Pathology Daily Session Note  Patient Details  Name: Raymond Kerr MRN: [id number] Date of Birth: February 28, 1955  Today's Date: 11/02/2020 SLP Individual Time: 1300-1345 SLP Individual Time Calculation (min): 45 min  Short Term Goals: Week 2: SLP Short Term Goal 1 (Week 2): Patient will tolerate current diet of Dys 3 solids, thin liquids with modA cues swallow safety and clearing oral cavity. SLP Short Term Goal 2 (Week 2): Patient will imitate at 2-3 syllable word level with mod-maxA cues to initiate and achieve adequate voicing. SLP Short Term Goal 3 (Week 2): Patient will point to identify object pictures/photos in field of 3-4 with 80% accuracy and modA cues. SLP Short Term Goal 4 (Week 2): Patient will imitate to produce words with initial bilabial (/p, b/) consonants with modA cues. SLP Short Term Goal 5 (Week 2): Patient will demonstrate ability to use basic level 4-6 cell communication board to request during structured tasks and/or for basic needs with modA cues. SLP Short Term Goal 6 (Week 2): Patient will initiate to perform tasks such as brushing teeth, feeding self, etc. with mod A cues.  Skilled Therapeutic Interventions: Pt seen for skilled ST with focus on communication and swallowing goals. SLP facilitating continued assessment of thin liquids via straw by providing Supervision level cues for small, single sips. Pt demonstrating no overt s/s aspiration or difficulty. Pt denying solid trials at this time 2' just finishing lunch meal. SLP creating vertical yes/no communication board to assess if pt able to understand and utilize more consistently vs horizontal board, however pt continues to require max A multimodal cues to attempt to initiate use of communication board. SLP attempting hand over hand point to yes/no on board regarding basic questions, however pt becoming distracted by use of L hand similar to previous tx session (looking at it, wiggling fingers, etc). Pt  cued to attempt thumbs up and thumbs down but unable to imitate following SLP model. Pt did produce minimal verbalizations this date, most sustained vowel "o", however unable to imitate any other sustained vowels, simple words or automatic speech despite max A. Pt left in bed with alarm set and all needs within reach. Cont ST POC.  Pain Pain Assessment Pain Scale: 0-10 Pain Score: 0-No pain  Therapy/Group: Individual Therapy  Tacey Ruiz 11/02/2020, 1:24 PM

## 2020-11-03 ENCOUNTER — Inpatient Hospital Stay (HOSPITAL_COMMUNITY): Payer: Medicare Other

## 2020-11-03 LAB — GLUCOSE, CAPILLARY
Glucose-Capillary: 168 mg/dL — ABNORMAL HIGH (ref 70–99)
Glucose-Capillary: 169 mg/dL — ABNORMAL HIGH (ref 70–99)
Glucose-Capillary: 172 mg/dL — ABNORMAL HIGH (ref 70–99)
Glucose-Capillary: 142 mg/dL — ABNORMAL HIGH (ref 70–99)

## 2020-11-03 NOTE — Progress Notes (Signed)
Physical Therapy Weekly Progress Note  Patient Details  Name: Raymond Kerr MRN: [id number] Date of Birth: 02-Feb-1956  Beginning of progress report period: October 25, 2020 End of progress report period: November 03, 2020  Today's Date: 11/03/2020 PT Individual Time: 7209-4709 PT Individual Time Calculation (min): 54 min   Patient has met 1 of 4 short term goals. Pt is making very slow progress with PT at this time. He requires totalA +2 for bed mobility, is able to sit unsupported with minA, +2 totalA for scoot pivot transfers via Bobath Method, and totalA +2 for sit<>stand transfers and static standing balance. He continues to show strong pusher tendencies in dynamic movements in both sitting and standing, decreased initiation of movement, expressive and receptive deficits, and dense R hemibody weakness.   Patient continues to demonstrate the following deficits muscle weakness and muscle joint tightness, decreased cardiorespiratoy endurance, abnormal tone, unbalanced muscle activation, motor apraxia, decreased coordination, and decreased motor planning, decreased visual acuity, decreased visual perceptual skills, and decreased visual motor skills, decreased midline orientation, decreased attention to right, decreased motor planning, and ideational apraxia, decreased initiation, decreased attention, decreased awareness, decreased problem solving, decreased safety awareness, decreased memory, and delayed processing, and decreased sitting balance, decreased standing balance, decreased postural control, hemiplegia, and decreased balance strategies and therefore will continue to benefit from skilled PT intervention to increase functional independence with mobility.  Patient progressing toward long term goals..  Continue plan of care.  PT Short Term Goals Week 1:  PT Short Term Goal 1 (Week 1): Pt will perform bed mobility with max assist of 1. PT Short Term Goal 1 - Progress (Week 1): Not met PT  Short Term Goal 2 (Week 1): Pt will perform sit<>stand with total A of 1. PT Short Term Goal 2 - Progress (Week 1): Not met PT Short Term Goal 3 (Week 1): Pt will initiate gait training PT Short Term Goal 3 - Progress (Week 1): Not met PT Short Term Goal 4 (Week 1): Pt will tolerate sitting in WC >2 hours between therapies PT Short Term Goal 4 - Progress (Week 1): Met Week 2:  PT Short Term Goal 1 (Week 2): Pt will complete bed mobility with maxA of 1 person PT Short Term Goal 2 (Week 2): Pt will complete dynamic sitting balance tasks with maxA for balance PT Short Term Goal 3 (Week 2): Pt will complete bed<>chair transfers with maxA of 1 person PT Short Term Goal 4 (Week 2): Pt will maintain static standing for 2 minutes or greater with maxA of 1 person  Skilled Therapeutic Interventions/Progress Updates:     Pt supine in bed at start of session, sleeping heavily but awakens to voice and tacile feedback. He does not appear to be in any pain; aphasia limiting. Donned knee-high TED's and shoes at bed level with totalA. Required totalA +2 for donning pants at bed level requires totalA (<20%) for rolling in bed. Supine<>sit with HOB flat with +2 totalA via log rolling, facilitating trunk and BLE management. Requires modA for initial sitting balance, progressing to minA with cues for midline and upright. No pushing tendencies in static sitting but become present during dynamic movements such as lateral scoots, forward leans, and transfers. Completed slide board transfer with +2 totalA from EOB to TIS w/c - completed via Bobath Method to reduce pushing tendencies and also to facilitate forward weight shift. Pt transported down to ortho rehab gym in w/c for time. Setup standing frame in front of BITS system  to work on [employer] in standing. Completed dependant stand in frame and then in standing, completed visual user paced targets with large font circles - pt with very significant delayed and sometimes absent  initiation. Even with hand-over-hand assist for his stronger LUE, he would have difficulty initiating tasks, sometimes staring blankly at the screen. Also attempted other activities in [place] such as large font letter tracing which he also required hand-over-hand assist for. Noted onset of pushing tendencies with fatigue. Pt returned to his room in TIS w/c where he remained slightly reclined for safety. Alarm belt placed and all needs within reach at conclusion of session.  Therapy Documentation Precautions:  Precautions Precautions: Fall Precaution Comments: R hemi Restrictions Weight Bearing Restrictions: No General:    Therapy/Group: Individual Therapy  Gladstone Rosas P Lenaya Pietsch PT 11/03/2020, 7:36 AM

## 2020-11-03 NOTE — Progress Notes (Signed)
PROGRESS NOTE   Subjective/Complaints: Unable to tolerate therapy today due to lethargy- stat Head CT ordered to assess for stroke extension.   ROS: Limited due to language/communication    Objective:   No results found. No results for input(s): WBC, HGB, HCT, PLT in the last 72 hours.   No results for input(s): NA, K, CL, CO2, GLUCOSE, BUN, CREATININE, CALCIUM in the last 72 hours.    Intake/Output Summary (Last 24 hours) at 11/03/2020 1259 Last data filed at 11/02/2020 1829 Gross per 24 hour  Intake 300 ml  Output --  Net 300 ml        Physical Exam: Vital Signs Blood pressure (!) 162/93, pulse 90, temperature 98 F (36.7 C), resp. rate 18, height 6' (1.829 m), weight 116.7 kg, SpO2 99 %. Gen: no distress, normal appearing HEENT: oral mucosa pink and moist, NCAT, left eye ptosis Cardio: Reg rate Chest: normal effort, normal rate of breathing Abd: soft, non-distended Ext: no edema Neuro: lethargic, opens right eye but not following commands   Assessment/Plan: 1. Functional deficits which require 3+ hours per day of interdisciplinary therapy in a comprehensive inpatient rehab setting. Physiatrist is providing close team supervision and 24 hour management of active medical problems listed below. Physiatrist and rehab team continue to assess barriers to discharge/monitor patient progress toward functional and medical goals  Care Tool:  Bathing    Body parts bathed by patient: Face   Body parts bathed by helper: Right arm, Left arm, Buttocks, Right upper leg, Right lower leg, Left lower leg     Bathing assist Assist Level: Set up assist     Upper Body Dressing/Undressing Upper body dressing   What is the patient wearing?: Hospital gown only    Upper body assist Assist Level: Maximal Assistance - Patient 25 - 49%    Lower Body Dressing/Undressing Lower body dressing      What is the patient  wearing?: Pants     Lower body assist Assist for lower body dressing: Total Assistance - Patient < 25% (supine in bed)     Toileting Toileting    Toileting assist Assist for toileting: 2 Helpers     Transfers Chair/bed transfer  Transfers assist  Chair/bed transfer activity did not occur: Safety/medical concerns  Chair/bed transfer assist level: 2 Helpers (scoot pivot)     Locomotion Ambulation   Ambulation assist   Ambulation activity did not occur: Safety/medical concerns          Walk 10 feet activity   Assist  Walk 10 feet activity did not occur: Safety/medical concerns        Walk 50 feet activity   Assist Walk 50 feet with 2 turns activity did not occur: Safety/medical concerns         Walk 150 feet activity   Assist Walk 150 feet activity did not occur: Safety/medical concerns         Walk 10 feet on uneven surface  activity   Assist Walk 10 feet on uneven surfaces activity did not occur: Safety/medical concerns         Wheelchair     Assist Is the patient using a wheelchair?:  (  yes, per PT note) Type of Wheelchair:  (manual per PT note) Wheelchair activity did not occur: Safety/medical concerns         Wheelchair 50 feet with 2 turns activity    Assist    Wheelchair 50 feet with 2 turns activity did not occur: Safety/medical concerns       Wheelchair 150 feet activity     Assist  Wheelchair 150 feet activity did not occur: Safety/medical concerns       Blood pressure (!) 162/93, pulse 90, temperature 98 F (36.7 C), resp. rate 18, height 6' (1.829 m), weight 116.7 kg, SpO2 99 %.    Medical Problem List and Plan: 1.  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  Impaired  mobility -DVT/anticoagulation:  Pharmaceutical: Continue Lovenox             -antiplatelet therapy: Aspirin 81 mg daily and Plavix 75 mg daily x90 days then aspirin alone 3. Pain Management: Tylenol as needed 4. Mood: Provide emotional support             -antipsychotic agents: N/A 5. Neuropsych: This patient is not capable of making decisions on his own behalf. 6. Skin/Wound Care: Routine skin checks 7. Fluids/Electrolytes/Nutrition: Routine in and outs with follow-up chemistries 8.  Dysphagia.  Mechanical soft thin liquids..  Patient limited p.o. intake.  Continue Megace to stimulate appetite follow-up speech therapy 9.  Hyperlipidemia. Continue Lipitor. LDL reviewed- 124.  10.  Diabetes mellitus.  Hemoglobin A1c 10.1.  CBGs 143-171: Semglee 10 units daily.  Diabetic teaching. Increase metformin to 750 mg BID with meals on 9/9. Monitor creatinine. Discontinue ISS.  Recent Labs    11/02/20 2103 11/03/20 0605 11/03/20 1123  GLUCAP 142* 168* 169*   -9/11 cbg's controlled  11.  Obesity.  BMI 35.37-->34.89.  Dietary follow-up. Discontinue feeding supplement.  12. Sub-optimal potassium: start daily supplement. Continue supplement.  13. Right arm spasticity: Increased baclofen to 10mg  TID on 9/9  -still with significant tone  -increase baclofen to 10mg  qid  -continue splinting, ROM 14. Lethargy: stat head CT ordered. WBC reviewed and normal. 15. Transaminitis: d/c tylenol and monitor LFTs weekly.  LOS: 10 days A FACE TO FACE EVALUATION WAS PERFORMED  11/9 Raymond Kerr 11/03/2020, 12:59 PM

## 2020-11-03 NOTE — Progress Notes (Signed)
Occupational Therapy Note  Patient Details  Name: Raymond Kerr MRN: [id number] Date of Birth: 11/20/1955  Today's Date: 11/03/2020 OT Missed Time: 30 Minutes Missed Time Reason: Patient fatigue  Pt sleeping upon arrival. Pt opened Rt eye when his name called out and with min multimodal cues but unable to keep eyes open and did not follow commands. Pt unable to participate 2/2 lethargy.   Lavone Neri [facility] 11/03/2020, 10:50 AM

## 2020-11-03 NOTE — Progress Notes (Signed)
Speech Language Pathology Daily Session Note  Patient Details  Name: Raymond Kerr MRN: [id number] Date of Birth: Apr 18, 1955  Today's Date: 11/03/2020 SLP Individual Time: 0803-0900 SLP Individual Time Calculation (min): 57 min  Short Term Goals: Week 2: SLP Short Term Goal 1 (Week 2): Patient will tolerate current diet of Dys 3 solids, thin liquids with modA cues swallow safety and clearing oral cavity. SLP Short Term Goal 2 (Week 2): Patient will imitate at 2-3 syllable word level with mod-maxA cues to initiate and achieve adequate voicing. SLP Short Term Goal 3 (Week 2): Patient will point to identify object pictures/photos in field of 3-4 with 80% accuracy and modA cues. SLP Short Term Goal 4 (Week 2): Patient will imitate to produce words with initial bilabial (/p, b/) consonants with modA cues. SLP Short Term Goal 5 (Week 2): Patient will demonstrate ability to use basic level 4-6 cell communication board to request during structured tasks and/or for basic needs with modA cues. SLP Short Term Goal 6 (Week 2): Patient will initiate to perform tasks such as brushing teeth, feeding self, etc. with mod A cues.  Skilled Therapeutic Interventions:Skilled ST services focused on swallow and language skills. Pt was asleep upon entering room requiring extra time to maintain alertness and continued with lethagry during the session/reduced focused/sustained attention. SLP left room to get breakfast tray and when SLP returned to room pt had produced BM in brief. SLP and NT changed brief in bed, pt was able to follow 1 step commands with mod A multimodal cues, requiring +2 for physical assist.  Pt demonstrated dramatic changes in communication and initiation as last observed by writer on 9/7. Pt only attempted verbalizations x2 producing initial "m" and "w" speech sounds with inability to imitate or shape sounds to functional word nor bilabial repetition. Pt favored "no" response with slight head nod and  was unable to confirm response with yes/no communication board, requiring hand over hand assistance. Pt's response to simple yes/no question pertaining to wants/needs during PO intake of dys 3 textures and thin liquids via cup/straw on breakfast tray demonstrated less than 50% accuracy with max A multimodal cues. Pt was able to self feed with fork once items were placed on fork with initial hand over hand fading to mod A verbal cues, however after 7-8 minutes pt required total A fading to max A verbal cues.  Pt was able to self feed with once cup was placed in hand with only min A verbal cues. Pt completed oral care with mild left residue noted, pt required mod A verbal cues. Pt demonstrated poor focused and sustained attention, staring straight ahead and required max A verbal cues to make eye contact with SLP. Pt was left in room with call bell within reach and bed alarm set. SLP recommends to continue skilled services.  SLP communicated with therapy team via secure chat on EPIC pertaining to functional changes in communication, initiation and attention.      Pain Pain Assessment Pain Score: 0-No pain  Therapy/Group: Individual Therapy  Raymond Kerr  [facility] 11/03/2020, 9:53 AM

## 2020-11-03 NOTE — Progress Notes (Signed)
Physical Therapy Session Note  Patient Details  Name: Raymond Kerr MRN: [id number] Date of Birth: Jul 17, 1955  Today's Date: 11/03/2020 PT Individual Time: 1445-1525 PT Individual Time Calculation (min): 40 min   Short Term Goals: Week 1:  PT Short Term Goal 1 (Week 1): Pt will perform bed mobility with max assist of 1. PT Short Term Goal 1 - Progress (Week 1): Not met PT Short Term Goal 2 (Week 1): Pt will perform sit<>stand with total A of 1. PT Short Term Goal 2 - Progress (Week 1): Not met PT Short Term Goal 3 (Week 1): Pt will initiate gait training PT Short Term Goal 3 - Progress (Week 1): Not met PT Short Term Goal 4 (Week 1): Pt will tolerate sitting in WC >2 hours between therapies PT Short Term Goal 4 - Progress (Week 1): Met  Skilled Therapeutic Interventions/Progress Updates:     Patient in TIS w/c asleep upon PT arrival. Patient slow to arouse, but maintained arousal once awake. Patient participated throughout session, following simple, one-step commands. He did not verbalize during session, but did use shaking his head no appropriately and would not respond when the answer was yes. Tried use of Yes/No board without success.   Therapeutic Activity: Bed Mobility: Patient performed sit to supine with max A of 1 person. Provided max multimodal cues for lowering to his bottom elbow and bringing knees to chest to lift his legs onto the bed. Patient performed bridging x2 with facilitation to R leg to allow PT to remove pants with total A. He performed rolling R/L to doff/don incontinence brief with total A due to bladder incontinence. Performed peri-care with total A due to patient fatigue.  Transfers: Patient performed a slide board transfer TIS w/c>bed with max A +2 and total A for board placement. Provided cues and facilitation for hand placement, board placement, and head-hips relationship for proper technique and decreased assist with transfers.   Neuromuscular Re-ed: Patient  performed the following sitting balance activities EOB >5 min: -static sitting balance working on midline orientation to correct mild R lean and L attention with cervical rotation and use of R hand to support trunk in sitting -dynamic sitting balance lifting legs to remove shoes then to continue knee flex/extension with 5 repetitions each side, required total A for R leg with intermittent spontaneous muscle twitch to touch or initiation of movement -in lying, performed prolonged stretch and rhythmic initiation to improved R upper extremity extension and reduce upper extremity flexor tone, supported arm with pillows to maintain ROM following  Patient in bed at end of session with breaks locked, bed alarm set, and all needs within reach.   Therapy Documentation Precautions:  Precautions Precautions: Fall Precaution Comments: R hemi Restrictions Weight Bearing Restrictions: No    Therapy/Group: Individual Therapy  Shequita Peplinski L Emylie Amster PT, DPT  11/03/2020, 3:59 PM

## 2020-11-04 LAB — GLUCOSE, CAPILLARY
Glucose-Capillary: 196 mg/dL — ABNORMAL HIGH (ref 70–99)
Glucose-Capillary: 176 mg/dL — ABNORMAL HIGH (ref 70–99)
Glucose-Capillary: 149 mg/dL — ABNORMAL HIGH (ref 70–99)
Glucose-Capillary: 165 mg/dL — ABNORMAL HIGH (ref 70–99)

## 2020-11-04 MED ORDER — METFORMIN HCL 500 MG PO TABS
500.0000 mg | ORAL_TABLET | Freq: Two times a day (BID) | ORAL | Status: DC
  Administered 2020-11-04 – 2020-11-25 (×41): 500 mg via ORAL
  Filled 2020-11-04 (×42): qty 1

## 2020-11-04 MED ORDER — BACLOFEN 5 MG HALF TABLET
5.0000 mg | ORAL_TABLET | Freq: Four times a day (QID) | ORAL | Status: DC
  Administered 2020-11-04 – 2020-11-10 (×23): 5 mg via ORAL
  Filled 2020-11-04 (×24): qty 1

## 2020-11-04 MED ORDER — METHYLPHENIDATE HCL 5 MG PO TABS
5.0000 mg | ORAL_TABLET | Freq: Every day | ORAL | Status: DC
  Administered 2020-11-05: 5 mg via ORAL
  Filled 2020-11-04: qty 1

## 2020-11-04 NOTE — Progress Notes (Signed)
Physical Therapy Session Note  Patient Details  Name: Raymond Kerr MRN: [id number] Date of Birth: May 04, 1955  Today's Date: 11/04/2020 PT Individual Time: 1015-1045 PT Individual Time Calculation (min): 30 min   Short Term Goals: Week 2:  PT Short Term Goal 1 (Week 2): Pt will complete bed mobility with maxA of 1 person PT Short Term Goal 2 (Week 2): Pt will complete dynamic sitting balance tasks with maxA for balance PT Short Term Goal 3 (Week 2): Pt will complete bed<>chair transfers with maxA of 1 person PT Short Term Goal 4 (Week 2): Pt will maintain static standing for 2 minutes or greater with maxA of 1 person  Skilled Therapeutic Interventions/Progress Updates:     Pt reclined in TIS w/c at start - sleeping soundly and somewhat difficulty to arouse. Once awaken, he falls back asleep. Adjusted TIS w/c to upright sitting and max verbal cues, pt able to wake up. Transported in w/c to day room rehab gym to high/low mat table to work on seated therapeutic activity as well as allowing natural sunlight from windows to improve circadian rhythm. Attempted to engage pt in a variety of activities such as rainbow arc, card matching, and simple puzzles however pt would continue to fall asleep or stare blankly out the window. He would not follow verbal commands or make effort to engage with hand-over-hand assist. Returned pt back to his room and remained reclined in TIS w/c with safety belt alarm on. All needs in reach. He missed 30 minutes of skilled therapy due to fatigue and inability to functionally participate.  Therapy Documentation Precautions:  Precautions Precautions: Fall Precaution Comments: R hemi Restrictions Weight Bearing Restrictions: No General: PT Amount of Missed Time (min): 30 Minutes PT Missed Treatment Reason: Patient fatigue  Therapy/Group: Individual Therapy  Hy Swiatek P Franca Stakes 11/04/2020, 7:30 AM

## 2020-11-04 NOTE — Progress Notes (Addendum)
PROGRESS NOTE   Subjective/Complaints: Vomiting this morning- will decrease metformin in case this is contributing.  Discussed with therapy that his Head CT does not show new infarcts.  ROS: Limited due to language/communication    Objective:   CT HEAD WO CONTRAST ( )  Result Date: 11/03/2020 CLINICAL DATA:  Altered mental status EXAM: CT HEAD WITHOUT CONTRAST TECHNIQUE: Contiguous axial images were obtained from the base of the skull through the vertex without intravenous contrast. COMPARISON:  Brain MRI 10/09/2020, CT head 10/10/2020 FINDINGS: Brain: There is hypodensity in the left cerebral peduncle, thalamus, periventricular white matter, and paracentral lobule region consistent with evolving infarcts as seen on the prior brain MRI. There are remote infarcts in the right frontal lobe and left cerebellar hemisphere. There is no evidence of acute intracranial hemorrhage, extra-axial fluid collection, or new infarct. The ventricles are stable in size, with unchanged mild ex vacuo dilatation of the right lateral ventricle. There is no mass lesion. There is no midline shift. Vascular: There is calcification of the bilateral cavernous ICAs. Skull: Normal. Negative for fracture or focal lesion. Sinuses/Orbits: The paranasal sinuses are clear. The globes and orbits are unremarkable. Other: None. IMPRESSION: 1. 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 2. No new infarct or hemorrhage. 3. Remote infarcts in the right frontal lobe and left cerebellar hemisphere, unchanged. Electronically Signed   By: Lesia Hausen M.D.   On: 11/03/2020 16:16   No results for input(s): WBC, HGB, HCT, PLT in the last 72 hours.   No results for input(s): NA, K, CL, CO2, GLUCOSE, BUN, CREATININE, CALCIUM in the last 72 hours.    Intake/Output Summary (Last 24 hours) at  11/04/2020 1030 Last data filed at 11/04/2020 0824 Gross per 24 hour  Intake 490 ml  Output --  Net 490 ml        Physical Exam: Vital Signs Blood pressure (!) 143/83, pulse (!) 101, temperature 98.9 F (37.2 C), resp. rate 18, height 6' (1.829 m), weight 116.7 kg, SpO2 97 %. Gen: no distress, normal appearing HEENT: oral mucosa pink and moist, NCAT, left eye ptosis Cardio: Tachycardic Chest: normal effort, normal rate of breathing Abd: soft, non-distended Ext: no edema Neuro: more alert today, again following simple commands- able to wash his face with cloth Psych: smiles in recognition, makes good eye contact.    Assessment/Plan: 1. Functional deficits which require 3+ hours per day of interdisciplinary therapy in a comprehensive inpatient rehab setting. Physiatrist is providing close team supervision and 24 hour management of active medical problems listed below. Physiatrist and rehab team continue to assess barriers to discharge/monitor patient progress toward functional and medical goals  Care Tool:  Bathing    Body parts bathed by patient: Face, Right arm, Chest, Abdomen   Body parts bathed by helper: Left arm     Bathing assist Assist Level: Moderate Assistance - Patient 50 - 74% (Mod A UB bathing at sink level in TIS)     Upper Body Dressing/Undressing Upper body dressing   What is the patient wearing?: Pull over shirt    Upper body assist Assist Level: Moderate Assistance -  Patient 50 - 74%    Lower Body Dressing/Undressing Lower body dressing      What is the patient wearing?: Pants     Lower body assist Assist for lower body dressing: Total Assistance - Patient < 25% (Bed level)     Toileting Toileting    Toileting assist Assist for toileting: 2 Helpers     Transfers Chair/bed transfer  Transfers assist  Chair/bed transfer activity did not occur: Safety/medical concerns  Chair/bed transfer assist level: Dependent - mechanical lift      Locomotion Ambulation   Ambulation assist   Ambulation activity did not occur: Safety/medical concerns          Walk 10 feet activity   Assist  Walk 10 feet activity did not occur: Safety/medical concerns        Walk 50 feet activity   Assist Walk 50 feet with 2 turns activity did not occur: Safety/medical concerns         Walk 150 feet activity   Assist Walk 150 feet activity did not occur: Safety/medical concerns         Walk 10 feet on uneven surface  activity   Assist Walk 10 feet on uneven surfaces activity did not occur: Safety/medical concerns         Wheelchair     Assist Is the patient using a wheelchair?:  (yes, per PT note) Type of Wheelchair:  (manual per PT note) Wheelchair activity did not occur: Safety/medical concerns         Wheelchair 50 feet with 2 turns activity    Assist    Wheelchair 50 feet with 2 turns activity did not occur: Safety/medical concerns       Wheelchair 150 feet activity     Assist  Wheelchair 150 feet activity did not occur: Safety/medical concerns       Blood pressure (!) 143/83, pulse (!) 101, temperature 98.9 F (37.2 C), resp. rate 18, height 6' (1.829 m), weight 116.7 kg, SpO2 97 %.    Medical Problem List and Plan: 1.  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   -Interdisciplinary Team Conference today   2.  Impaired mobility -DVT/anticoagulation:  Pharmaceutical: Continue Lovenox             -antiplatelet therapy: Aspirin 81 mg daily and Plavix 75 mg daily x90 days then aspirin alone 3. Pain Management: Tylenol as needed 4. Mood: Provide emotional support             -antipsychotic agents: N/A 5.  Neuropsych: This patient is not capable of making decisions on his own behalf. 6. Skin/Wound Care: Routine skin checks 7. Fluids/Electrolytes/Nutrition: Routine in and outs with follow-up chemistries 8.  Dysphagia.  Mechanical soft thin liquids..  Patient limited p.o. intake.  Continue Megace to stimulate appetite follow-up speech therapy 9.  Hyperlipidemia. Continue Lipitor. LDL reviewed- 124.  10.  Diabetes mellitus.  Hemoglobin A1c 10.1.  CBGs 143-171: Semglee 10 units daily.  Diabetic teaching. Decrease metformin to 500mg  BID in case contributing to nausea. Monitor creatinine. Discontinue ISS.  Recent Labs    11/03/20 1621 11/03/20 2058 11/04/20  0600  GLUCAP 172* 142* 196*   9/13: CBGs well controlled.   11.  Obesity.  BMI 35.37-->34.89.  Dietary follow-up. Discontinue feeding supplement.  12. Sub-optimal potassium: start daily supplement. Continue supplement.  13. Right arm spasticity: Decrease Baclofen to 5mg  QID given lethargy, will try outpatient Botox.   -still with significant tone  -continue splinting, ROM 14. Lethargy: stat head CT ordered. WBC reviewed and normal. Decrease Baclofen to 5mg  4 times per day.  15. Transaminitis: d/c tylenol and monitor LFTs weekly. 16. Impaired initiation: start ritalin 5mg  tomorrow 16. Disposition: d/c 10/1. F/u with me on 10/21  LOS: 11 days A FACE TO FACE EVALUATION WAS PERFORMED  Raymond Kerr P Emmanuel Ercole 11/04/2020, 10:30 AM

## 2020-11-04 NOTE — Progress Notes (Signed)
Pres shift reported vomiting, unsure of time and activity. During breakfast pt really didn't want to eat much, administered morning medications and pt vomited small amount. BM current, BS slow with ABD non tender.

## 2020-11-04 NOTE — Patient Care Conference (Signed)
Inpatient RehabilitationTeam Conference and Plan of Care Update Date: 11/04/2020   Time: 13:17 PM    Patient Name: Raymond Kerr      Medical Record Number: [id number]  Date of Birth: 09-28-1955 Sex: Male         Room/Bed: 4W22C/4W22C-01 Payor Info: Payor: /    Admit Date/Time:  10/24/2020  4:41 PM  Primary Diagnosis:  Left thalamic infarction [facility])  Hospital Problems: Principal Problem:   Left thalamic infarction [facility])    Expected Discharge Date: Expected Discharge Date: 11/22/20  Team Members Present: Physician leading conference: Dr. Sula Soda Social Worker Present: Dossie Der, LCSW Nurse Present: Chana Bode, RN PT Present: Wynelle Link, PT OT Present: Lina Sayre, OT SLP Present: Colin Benton, SLP     Current Status/Progress Goal Weekly Team Focus  Bowel/Bladder             Swallow/Nutrition/ Hydration   dys 3 textures and thin liquids, mod A  Min A  swallow strategies   ADL's   Mod A UB bath/dress at sink, Total A LB bath/dress at bed level, Mod A grooming at sink, increased time to process all verbal information, non-verbal.  MIN A - Supervision goals  Self-care re-education, supported/unsupported sitting balance, NMR and functional transfers.   Mobility   +2 totalA bed mobility, minA for static sitting balance, +2 totalA for SB transfers, +2 totalA for sit<>stand transfer. R inattention/neglect, L gaze, significant tone in R hemibody. Nonverbal. Strong PUSHER to paretic R side during dynamic functional tasks  mod/maxA  RLE NMR, standing in standing frame, functional transfers, activity tolerance   Communication   fluctuating last week postive changes (naming at word level, writting 3 letter words) since then max A (limited gestural yes/no response, limited verbalization)  MIn A  yes/no, communication board, writting and vocalizating   Safety/Cognition/ Behavioral Observations  fluctuating min A (last week) and max A this week  Min A   focused and sustained attention   Pain             Skin               Discharge Planning:  Home with daughter';s who are aware of his need for 24/7 physical care, do not feel they are realistic regarding pt's care needs   Team Discussion: Patient with lethargy; MD adjusting medications for tone and DM due to nausea/vomiting.  Patient on target to meet rehab goals: no, currently mod assist for bating and dressing at the sink with max cues. Needs total assist for lower body ADLs. Total assist +2 for bed mobility, slide -board or squat pivots. Recommend hoyer for nursing transfers. Non-ambulatory for discharge. Has a neurological stutter but able to write a three letter word. Goals for discharge set at mod assist overall.  *See Care Plan and progress notes for long and short-term goals.   Revisions to Treatment Plan:  Ritalin trial, wean baclofen   Teaching Needs: Safety, medications, secondary risk management, transfers, toileting, etc  Current Barriers to Discharge: Decreased caregiver support, Home enviroment access/layout, Incontinence, and Insurance for SNF coverage  Possible Resolutions to Barriers: Family education     Medical Summary Current Status: left eye ptosis, lethargic, right elbow flexor spasticity, global aphasia, uncontrolled type 2 diabetes mellitus  Barriers to Discharge: Decreased family/caregiver support;Neurogenic Bowel & Bladder;Weight;Medical stability  Barriers to Discharge Comments: left eye ptosis, lethargic, right elbow flexor spasticity, global aphasia, uncontrolled type 2 diabetes mellitus Possible Resolutions to [employer] Focus: decreased baclofen to 5mg  QID  given lethargy, CT Head reviewed and is stable, decrease metformin given nausea today   Continued Need for Acute Rehabilitation Level of Care: The patient requires daily medical management by a physician with specialized training in physical medicine and rehabilitation for the following  reasons: Direction of a multidisciplinary physical rehabilitation program to maximize functional independence : Yes Medical management of patient stability for increased activity during participation in an intensive rehabilitation regime.: Yes Analysis of laboratory values and/or radiology reports with any subsequent need for medication adjustment and/or medical intervention. : Yes   I attest that I was present, lead the team conference, and concur with the assessment and plan of the team.   Chana Bode B 11/04/2020, 2:26 PM

## 2020-11-04 NOTE — Progress Notes (Signed)
Speech Language Pathology Daily Session Note  Patient Details  Name: Raymond Kerr MRN: [id number] Date of Birth: Jul 23, 1955  Today's Date: 11/04/2020 SLP Individual Time: 1400-1430 and 1212- 1245 SLP Individual Time Calculation (min): 30 min and 33 min   Short Term Goals: Week 2: SLP Short Term Goal 1 (Week 2): Patient will tolerate current diet of Dys 3 solids, thin liquids with modA cues swallow safety and clearing oral cavity. SLP Short Term Goal 2 (Week 2): Patient will imitate at 2-3 syllable word level with mod-maxA cues to initiate and achieve adequate voicing. SLP Short Term Goal 3 (Week 2): Patient will point to identify object pictures/photos in field of 3-4 with 80% accuracy and modA cues. SLP Short Term Goal 4 (Week 2): Patient will imitate to produce words with initial bilabial (/p, b/) consonants with modA cues. SLP Short Term Goal 5 (Week 2): Patient will demonstrate ability to use basic level 4-6 cell communication board to request during structured tasks and/or for basic needs with modA cues. SLP Short Term Goal 6 (Week 2): Patient will initiate to perform tasks such as brushing teeth, feeding self, etc. with mod A cues.  Skilled Therapeutic Interventions: #1 Skilled ST services focused on swallow skills. SLP treated pt during lunch to assess swallowing skills and to engage pt in functional tasks. Pt was asleep upon entering room and required tactile and verbal cues, along was cold compress to gain alertness opening eyes for 10 seconds increasing throughout session to 30-45 second intervals. Pt demonstrated no attempts of verbalization and required hand over hand assist for all task, ultimately total A for limited PO intake. NT and nurse reporting vomiting this am with little PO intake. Pt indicated no response to yes/no questions pertaining to wants/needs in attempts of feeding pt items from lunch tray. Pt did raise eye brows when SLP asked about ginger ale. SLP provided ginger  ale, pt consumed via straw when presented to lips. Pt consumed a total of 6oz with piecemeal swallow and delayed initiation and no overt s/s aspiration. Pt keep bilabials sealed when presented with items from lunch tray except for saltine cracker x1 and x1 bite of bread, demonstrating again piecemeal swallow and delayed swallow initiated. Pt kept bilabials sealed when presented with ginger ale via straw and SLP confirmed gesture with yes/no question, pt shake head "no" indicating completion of meal. Pt was left in room with call bell within reach and chair alarm set. SLP recommends to continue skilled services.  #2 Skilled ST services focused on speech skills. Pt opened eyes easy to greeting upon entering room and right eye remained open during treatment session. Pt attempted to verbalize x4, vowel and consonant (V/C) sounds however unable to imitate V/C sounds or confirm attempted verbalization response via yes/no questions with max multimodal cues. Pt began masticating and SLP was able to see mild oral residue in left buccal cavity. Pt required max A multimodal cues to expel bolus and then SLP completed oral care. Pt consumed thin liquids via straw after oral care with minimal swallow delay. SLP educated NT to complete oral care after meals assessing left buccal pocketing and updated diet order to reflect precautions. SLP attempted to facilitate following 1 step commands and initiation in color sorting task in a field of 2 ( with large bean bag), however pt required total A even after hand over hand assistance and demonstration cues given. While pt did keep right eye open during session, pt continued to demonstrated reduced focused attention in  30 second intervals and limited gestures or vocalizations to yes/no questions. Pt was left in room with call bell within reach and chair alarm set. SLP recommends to continue skilled services.       Pain Pain Assessment Pain Score: 0-No pain  Therapy/Group:  Individual Therapy  Chantrice Hagg  [facility] 11/04/2020, 2:35 PM

## 2020-11-04 NOTE — Progress Notes (Signed)
Occupational Therapy Session Note  Patient Details  Name: Raymond Kerr MRN: [id number] Date of Birth: 11/22/1955  Today's Date: 11/04/2020 OT Individual Time: 1761-6073 OT Individual Time Calculation (min): 55 min    Short Term Goals: Week 1:  OT Short Term Goal 1 (Week 1): Pt will demonstrate improved initiation with leaning forward and using left hand on bar of stedy to move to stand with max A of 1. OT Short Term Goal 1 - Progress (Week 1): Not met OT Short Term Goal 2 (Week 1): Pt will demonstrate improved sit balance to sit EOB with min A while bathing UB with min a. OT Short Term Goal 2 - Progress (Week 1): Not met OT Short Term Goal 3 (Week 1): Pt will engage in RUE self ROM with mod A. OT Short Term Goal 3 - Progress (Week 1): Not met OT Short Term Goal 4 (Week 1): Pt will don tshirt with mod A. OT Short Term Goal 4 - Progress (Week 1): Not met Week 2:  OT Short Term Goal 1 (Week 2): Pt will demonstrate improved initiation with leaning forward and using left hand on bar of stedy to move to stand with max A of 1. OT Short Term Goal 2 (Week 2): Pt will demonstrate improved sit balance to sit EOB with min A while bathing UB with min a. OT Short Term Goal 3 (Week 2): Pt will engage in RUE self ROM with mod A. OT Short Term Goal 4 (Week 2): Pt will don tshirt with mod A.   Skilled Therapeutic Interventions/Progress Updates:  Patient met lying supine in bed with L eye open (continued R eye ptosis). 0/10 on [employer] at rest and with activity. Sheets soiled with emesis. LB dressing at bed level with Total A. Total A with maximove for transfer to TIS wc for patient/therapist safety. At sink level patient able to wash face with Mod A for thoroughness. Patient also able to wash RUE, chest and abdomen with Mod A and max cues for sequencing and continuation. Assist to wash/dry LUE. Patient able to don UB clothing with Mod A and max cues as stated above. Oral hygiene seated at sink level with  Mod A. Total A for wc transport to dayroom in prep for there. act. With max multimodal cues for sequencing, patient able to reach forward and grab bean bag presented in central vision. Difficulty sequencing tossing of bean bag indicating decreased motor planning, decreased initiation and decreased problem solving. Session concluded with patient seated in TIS wc with call bell within reach, belt alarm activated and all needs met.   Therapy Documentation Precautions:  Precautions Precautions: Fall Precaution Comments: R hemi Restrictions Weight Bearing Restrictions: No General:    Therapy/Group: Individual Therapy  [name] 11/04/2020, 10:02 AM

## 2020-11-05 LAB — GLUCOSE, CAPILLARY
Glucose-Capillary: 161 mg/dL — ABNORMAL HIGH (ref 70–99)
Glucose-Capillary: 180 mg/dL — ABNORMAL HIGH (ref 70–99)
Glucose-Capillary: 156 mg/dL — ABNORMAL HIGH (ref 70–99)
Glucose-Capillary: 162 mg/dL — ABNORMAL HIGH (ref 70–99)

## 2020-11-05 MED ORDER — METHYLPHENIDATE HCL 5 MG PO TABS
5.0000 mg | ORAL_TABLET | Freq: Two times a day (BID) | ORAL | Status: DC
  Administered 2020-11-05 – 2020-11-11 (×13): 5 mg via ORAL
  Filled 2020-11-05 (×12): qty 1

## 2020-11-05 NOTE — Progress Notes (Signed)
Physical Therapy Session Note  Patient Details  Name: Raymond Kerr MRN: [id number] Date of Birth: 17-Sep-1955  Today's Date: 11/05/2020 PT Individual Time: 1034-1100 PT Individual Time Calculation (min): 26 min   Short Term Goals:  Week 2:  PT Short Term Goal 1 (Week 2): Pt will complete bed mobility with maxA of 1 person PT Short Term Goal 2 (Week 2): Pt will complete dynamic sitting balance tasks with maxA for balance PT Short Term Goal 3 (Week 2): Pt will complete bed<>chair transfers with maxA of 1 person PT Short Term Goal 4 (Week 2): Pt will maintain static standing for 2 minutes or greater with maxA of 1 person   Skilled Therapeutic Interventions/Progress Updates:   Pt received sitting in WC and agreeable to PT. Pt transported to rail in hall. Sit<>stand x 2 with max assist with +2 for safety. PT required to facilitate anterior weight shift as well to force weight through the RLE. Assist from PT to then maintain midline with visual feedback from mirror. Weight shift L and R to improve WB through the LLE to reduce pushing response as well as reduce flexor tone on the RLE. Performed x 2 bouts, 8 reps bil for each bout. Patient returned to room and left sitting in [facility] with call bell in reach and all needs met.         Therapy Documentation Precautions:  Precautions Precautions: Fall Precaution Comments: R hemi, global aphasia Restrictions Weight Bearing Restrictions: No  Vital Signs: Therapy Vitals Temp: 98.6 F (37 C) Temp Source: Oral Pulse Rate: 95 Resp: 17 BP: (!) 146/93 Patient Position (if appropriate): Sitting Oxygen Therapy SpO2: 100 % O2 Device: Room Air Pain: Pain Assessment Pain Scale: Faces Pain Score: 0-No pain     Therapy/Group: Individual Therapy  Lorie Phenix 11/05/2020, 5:45 PM

## 2020-11-05 NOTE — Progress Notes (Signed)
Occupational Therapy Session Note  Patient Details  Name: Raymond Kerr MRN: [id number] Date of Birth: 1955/12/06  Today's Date: 11/05/2020 OT Individual Time: 0800-0902 OT Individual Time Calculation (min): 62 min    Short Term Goals: Week 2:  OT Short Term Goal 1 (Week 2): Pt will demonstrate improved initiation with leaning forward and using left hand on bar of stedy to move to stand with max A of 1. OT Short Term Goal 2 (Week 2): Pt will demonstrate improved sit balance to sit EOB with min A while bathing UB with min a. OT Short Term Goal 3 (Week 2): Pt will engage in RUE self ROM with mod A. OT Short Term Goal 4 (Week 2): Pt will don tshirt with mod A.  Skilled Therapeutic Interventions/Progress Updates:    Pt in bed to start session with focus on self care retraining supine to sit.  He was able to be awoken with mod stimulation and maintained sustained attention to bathing and dressing tasks throughout.  Still demonstrates left eye ptosis.  Max demonstrational cueing for initiation of washing front peri area in supine with HOB up using the LUE.  He was able to then roll to the right with min assist and to the left with total assist for therapist to assist with washing buttocks and re-donning the clean brief.  Therapist provided total assist for washing lower legs and feet as well as for donning paper scrub pants over feet and pulling up over legs.  Total assist for rolling to pull them up over his hips with transition to sitting at the same level from left sidelying.  He then worked on UB bathing from EOB with overall mod assist for balance secondary to posterior lean and pushing to the right.  Worked on having him reach to grasp the rung out washcloth to the left to promote weightshift to the pushing side.  Max hand over hand for initiation of washing face with perseveration noted, and hands on cueing also needed to transition to washing his abdomen and chest.  Therapist assisted with washing  his back.  Mod assist with mod demonstrational cueing for donning pullover shirt with transition back to supine at total assist for donning TEDs.  Noted increased tone in the right elbow flexors at 3/4 on the Modified Ashworth Scale.  Therapist able to range into full elbow extension.  Pt left with the call button and phone in reach and safety alarm in place.    Therapy Documentation Precautions:  Precautions Precautions: Fall Precaution Comments: R hemi, global aphasia Restrictions Weight Bearing Restrictions: No  Pain: Pain Assessment Pain Scale: Faces Pain Score: 0-No pain ADL: See Care Tool Section for details of mobility and selfcare    Therapy/Group: Individual Therapy  Shandon Burlingame OTR/L 11/05/2020, 9:07 AM

## 2020-11-05 NOTE — Progress Notes (Signed)
Physical Therapy Session Note  Patient Details  Name: Raymond Kerr MRN: [id number] Date of Birth: 09-10-1955  Today's Date: 11/05/2020 PT Individual Time: 0900-0957 PT Individual Time Calculation (min): 57 min   Short Term Goals: Week 2:  PT Short Term Goal 1 (Week 2): Pt will complete bed mobility with maxA of 1 person PT Short Term Goal 2 (Week 2): Pt will complete dynamic sitting balance tasks with maxA for balance PT Short Term Goal 3 (Week 2): Pt will complete bed<>chair transfers with maxA of 1 person PT Short Term Goal 4 (Week 2): Pt will maintain static standing for 2 minutes or greater with maxA of 1 person  Skilled Therapeutic Interventions/Progress Updates:     Pt seen supine in bed at start of session, sleeping soundly but awakens to voice. He does not appear to be in any pain during our session. Donned shoes at bed level for time. Noted breakfast tray untouched at bedside. Pt assisted to sitting at EOB with +2 totalA (pt <20%). At EOB, initially required modA for sitting balance but this progressed to CGA with multi-modal cueing. Setup breakfast tray for him to eat which he did take a few bites - required assist for opening containers and cues for visual scanning to different food items. Also required instruction for finishing chewing and swallowing b/w bites. Pt then assisted to w/c via lateral scoot transfer with +2 totalA, towards his paretic R side - continues to lack forward weight shift and initiation to assist with functional transfers. Pt then wheeled sinkside where he was provided a washcloth to wash his face, requiring hand-over-hand assist for facilitate. Pt wheeled to ortho rehab gym and placed in front of standing frame with BITS system in reach. Completed dependant stand in frame with +2 assist for safety - pushing tendencies to the paretic R side which needed corrected. In standing, worked on Haematologist with stronger LUE with bias towards L side to promote L weight  shift and reduce pushing. Pt with absent initiation during this task, despite hand-over-hand assist. Often would stare blankly at the screen and not visually scan with head turns or oculomotor. Pt returned to his room at completion of session - remained reclined in TIS (for safety) with safety belt alarm on and all needs within reach.   Therapy Documentation Precautions:  Precautions Precautions: Fall Precaution Comments: R hemi Restrictions Weight Bearing Restrictions: No General:    Therapy/Group: Individual Therapy  Orrin Brigham 11/05/2020, 7:47 AM

## 2020-11-05 NOTE — Progress Notes (Signed)
PROGRESS NOTE   Subjective/Complaints: He has no complaints  More emotional this morning when I discussed going home.  Spasticity in right arm and leg has improved Denies pain during sessions  ROS: Limited due to language/communication    Objective:   CT HEAD WO CONTRAST ( )  Result Date: 11/03/2020 CLINICAL DATA:  Altered mental status EXAM: CT HEAD WITHOUT CONTRAST TECHNIQUE: Contiguous axial images were obtained from the base of the skull through the vertex without intravenous contrast. COMPARISON:  Brain MRI 10/09/2020, CT head 10/10/2020 FINDINGS: Brain: There is hypodensity in the left cerebral peduncle, thalamus, periventricular white matter, and paracentral lobule region consistent with evolving infarcts as seen on the prior brain MRI. There are remote infarcts in the right frontal lobe and left cerebellar hemisphere. There is no evidence of acute intracranial hemorrhage, extra-axial fluid collection, or new infarct. The ventricles are stable in size, with unchanged mild ex vacuo dilatation of the right lateral ventricle. There is no mass lesion. There is no midline shift. Vascular: There is calcification of the bilateral cavernous ICAs. Skull: Normal. Negative for fracture or focal lesion. Sinuses/Orbits: The paranasal sinuses are clear. The globes and orbits are unremarkable. Other: None. IMPRESSION: 1. 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 2. No new infarct or hemorrhage. 3. Remote infarcts in the right frontal lobe and left cerebellar hemisphere, unchanged. Electronically Signed   By: Lesia Hausen M.D.   On: 11/03/2020 16:16   No results for input(s): WBC, HGB, HCT, PLT in the last 72 hours.   No results for input(s): NA, K, CL, CO2, GLUCOSE, BUN, CREATININE, CALCIUM in the last 72 hours.    Intake/Output Summary (Last 24 hours)  at 11/05/2020 1142 Last data filed at 11/04/2020 1851 Gross per 24 hour  Intake 640 ml  Output --  Net 640 ml        Physical Exam: Vital Signs Blood pressure (!) 145/84, pulse 83, temperature 97.9 F (36.6 C), resp. rate 20, height 6' (1.829 m), weight 116.7 kg, SpO2 99 %. Gen: no distress, normal appearing HEENT: oral mucosa pink and moist, NCAT, left eye ptosis Cardio: Tachycardic Chest: normal effort, normal rate of breathing Abd: soft, non-distended Ext: no edema Neuro: more alert today, again following simple commands- able to wash his face with cloth Psych: smiles in recognition, makes good eye contact.    Assessment/Plan: 1. Functional deficits which require 3+ hours per day of interdisciplinary therapy in a comprehensive inpatient rehab setting. Physiatrist is providing close team supervision and 24 hour management of active medical problems listed below. Physiatrist and rehab team continue to assess barriers to discharge/monitor patient progress toward functional and medical goals  Care Tool:  Bathing    Body parts bathed by patient: Face, Right arm, Chest, Abdomen, Front perineal area   Body parts bathed by helper: Buttocks, Right upper leg, Left upper leg, Left lower leg, Right lower leg, Left arm     Bathing assist Assist Level: Maximal Assistance - Patient 24 - 49%     Upper Body Dressing/Undressing Upper body dressing   What is the patient wearing?: Pull over shirt  Upper body assist Assist Level: Moderate Assistance - Patient 50 - 74%    Lower Body Dressing/Undressing Lower body dressing      What is the patient wearing?: Pants, Incontinence brief     Lower body assist Assist for lower body dressing: Total Assistance - Patient < 25% (bed level)     Toileting Toileting    Toileting assist Assist for toileting: 2 Helpers     Transfers Chair/bed transfer  Transfers assist  Chair/bed transfer activity did not occur: Safety/medical  concerns  Chair/bed transfer assist level: Dependent - mechanical lift     Locomotion Ambulation   Ambulation assist   Ambulation activity did not occur: Safety/medical concerns          Walk 10 feet activity   Assist  Walk 10 feet activity did not occur: Safety/medical concerns        Walk 50 feet activity   Assist Walk 50 feet with 2 turns activity did not occur: Safety/medical concerns         Walk 150 feet activity   Assist Walk 150 feet activity did not occur: Safety/medical concerns         Walk 10 feet on uneven surface  activity   Assist Walk 10 feet on uneven surfaces activity did not occur: Safety/medical concerns         Wheelchair     Assist Is the patient using a wheelchair?:  (yes, per PT note) Type of Wheelchair:  (manual per PT note) Wheelchair activity did not occur: Safety/medical concerns         Wheelchair 50 feet with 2 turns activity    Assist    Wheelchair 50 feet with 2 turns activity did not occur: Safety/medical concerns       Wheelchair 150 feet activity     Assist  Wheelchair 150 feet activity did not occur: Safety/medical concerns       Blood pressure (!) 145/84, pulse 83, temperature 97.9 F (36.6 C), resp. rate 20, height 6' (1.829 m), weight 116.7 kg, SpO2 99 %.    Medical Problem List and Plan: 1.  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  2.  Impaired mobility -DVT/anticoagulation:  Pharmaceutical: Continue Lovenox             -antiplatelet therapy: Aspirin 81 mg daily and Plavix 75 mg daily x90 days then aspirin alone 3. Pain Management: Tylenol as needed 4. Mood: Provide emotional support              -antipsychotic agents: N/A 5. Neuropsych: This patient is not capable of making decisions on his own behalf. 6. Skin/Wound Care: Routine skin checks 7. Fluids/Electrolytes/Nutrition: Routine in and outs with follow-up chemistries 8.  Dysphagia.  Mechanical soft thin liquids..  Patient limited p.o. intake.  Continue Megace to stimulate appetite follow-up speech therapy 9.  Hyperlipidemia. Continue Lipitor. LDL reviewed- 124.  10.  Diabetes mellitus.  Hemoglobin A1c 10.1.  CBGs 143-171: Semglee 10 units daily.  Diabetic teaching. Decrease metformin to 500mg  BID in case contributing to nausea. Monitor creatinine. Discontinue ISS.  Recent Labs    11/04/20 1627 11/04/20  2055 11/05/20 0557  GLUCAP 149* 165* 161*   9/14: Cbgs well controlled, continue to monitor 11.  Obesity.  BMI 35.37-->34.89.  Dietary follow-up. Discontinue feeding supplement.  12. Sub-optimal potassium: start daily supplement. Continue supplement.  13. Right arm spasticity: Decrease Baclofen to 5mg  QID given lethargy, will try outpatient Botox.   -still with significant tone  -continue splinting, ROM 14. Lethargy: stat head CT ordered. WBC reviewed and normal. Decrease Baclofen to 5mg  4 times per day.  15. Transaminitis: d/c tylenol and monitor LFTs weekly. 16. Impaired initiation: good response- increase to 5mg  BID 16. Disposition: d/c 10/1. F/u with me on 10/21  LOS: 12 days A FACE TO FACE EVALUATION WAS PERFORMED  P Sebastian Dzik 11/05/2020, 11:42 AM

## 2020-11-05 NOTE — Progress Notes (Signed)
Speech Language Pathology Daily Session Note  Patient Details  Name: Raymond Kerr MRN: [id number] Date of Birth: Dec 10, 1955  Today's Date: 11/05/2020 SLP Individual Time: 1330-1430 SLP Individual Time Calculation (min): 60 min  Short Term Goals: Week 2: SLP Short Term Goal 1 (Week 2): Patient will tolerate current diet of Dys 3 solids, thin liquids with modA cues swallow safety and clearing oral cavity. SLP Short Term Goal 2 (Week 2): Patient will imitate at 2-3 syllable word level with mod-maxA cues to initiate and achieve adequate voicing. SLP Short Term Goal 3 (Week 2): Patient will point to identify object pictures/photos in field of 3-4 with 80% accuracy and modA cues. SLP Short Term Goal 4 (Week 2): Patient will imitate to produce words with initial bilabial (/p, b/) consonants with modA cues. SLP Short Term Goal 5 (Week 2): Patient will demonstrate ability to use basic level 4-6 cell communication board to request during structured tasks and/or for basic needs with modA cues. SLP Short Term Goal 6 (Week 2): Patient will initiate to perform tasks such as brushing teeth, feeding self, etc. with mod A cues.  Skilled Therapeutic Interventions:   Patient seen for skilled ST session focusing on aphasia and dysphagia goals. Patient in [facility] when SLP arrived and was in agreement to be taken to speech office for therapy session. Patient was awake and alert and did exhibit more frequent changes in facial expressions than this SLP has noticed. He picked up objects when named in field of two with 40% accuracy. He then completed novel task of matching colored balls with corresponding colored cup and demonstrated improved accuracy with increasing field from 3 to 6 following SLP modeling and initially needing hand over hand to pick up balls. He did start to exhibit some apraxic movements such as motioning/gesturing as if picking object up although was reaching in the air. He exhibited repetition of initial  phoneme (bilabials /p/, /b/) during naming/repetition activity and required maxA verbal cues to redirect from this. SLP set patient up with oral care and after handing him toothbrush, he was able to brush teeth, moving from bottom to top teeth and then rinse with water from cup with trace to mild suspected PO residuals cleared. He then consumed consecutive straw sips of water without overt s/s aspiration or penetration. Patient was brought back to room and left in [facility] with all immediate needs in reach and safety alarm belt connected.  Pain Pain Assessment Pain Scale: Faces Pain Score: 0-No pain  Therapy/Group: Individual Therapy  Angela Nevin, MA, CCC-SLP Speech Therapy

## 2020-11-06 LAB — GLUCOSE, CAPILLARY
Glucose-Capillary: 144 mg/dL — ABNORMAL HIGH (ref 70–99)
Glucose-Capillary: 159 mg/dL — ABNORMAL HIGH (ref 70–99)
Glucose-Capillary: 137 mg/dL — ABNORMAL HIGH (ref 70–99)
Glucose-Capillary: 161 mg/dL — ABNORMAL HIGH (ref 70–99)

## 2020-11-06 NOTE — Progress Notes (Signed)
Physical Therapy Session Note  Patient Details  Name: Raymond Kerr MRN: [id number] Date of Birth: 1955-10-24  Today's Date: 11/06/2020 PT Individual Time: 1445-1525 PT Individual Time Calculation (min): 40 min   Short Term Goals: Week 2:  PT Short Term Goal 1 (Week 2): Pt will complete bed mobility with maxA of 1 person PT Short Term Goal 2 (Week 2): Pt will complete dynamic sitting balance tasks with maxA for balance PT Short Term Goal 3 (Week 2): Pt will complete bed<>chair transfers with maxA of 1 person PT Short Term Goal 4 (Week 2): Pt will maintain static standing for 2 minutes or greater with maxA of 1 person  Skilled Therapeutic Interventions/Progress Updates:     Pt seen supine in bed, sleeping on arrival but awakens to voice (awakens easier compared to previous days). Attempted to use yes/no communication board but inconsistent responses provided. Noted lunch tray untouched at bedside. Assisted pt to EOB with +2 totalA. Requires modA for sitting balance due to posterior bias and able to progress to intermittent minA while eating his lunch. He required hand-over-hand assist for using fork to get his food and he was able to take a few bites of different textures. Required instruction for chewing and swallowing prior to taking more bites. At times, he would lift the fork into his mouth even though no food was on it. We then worked on his sit<>stand transfers with +2 maxA via 3-muskateers technique. In standing, he required +2 modA for standing balance due to R pushing towards paretic side. He completed a few reps of these stands from an elevated bed surface. Attempted to perform a stand<>pivot 3-muskateers transfer to the TIS w/c but due to safety concerns from R pushing and heavy R lean, deferred to squat pivot. He completed the squat<>pivot transfer with +2 totalA to his w/c with improved initiation noted. He concluded session reclined in TIS w/c with safety belt alarm on and all needs  within reach.   Therapy Documentation Precautions:  Precautions Precautions: Fall Precaution Comments: R hemi, global aphasia Restrictions Weight Bearing Restrictions: No General:    Therapy/Group: Individual Therapy  Kayman Snuffer P Lyah Millirons PT 11/06/2020, 7:39 AM

## 2020-11-06 NOTE — Progress Notes (Signed)
Speech Language Pathology Daily Session Note  Patient Details  Name: Raymond Kerr MRN: [id number] Date of Birth: 09-Apr-1955  Today's Date: 11/06/2020 SLP Individual Time: 0930-1030 SLP Individual Time Calculation (min): 60 min  Short Term Goals: Week 2: SLP Short Term Goal 1 (Week 2): Patient will tolerate current diet of Dys 3 solids, thin liquids with modA cues swallow safety and clearing oral cavity. SLP Short Term Goal 2 (Week 2): Patient will imitate at 2-3 syllable word level with mod-maxA cues to initiate and achieve adequate voicing. SLP Short Term Goal 3 (Week 2): Patient will point to identify object pictures/photos in field of 3-4 with 80% accuracy and modA cues. SLP Short Term Goal 4 (Week 2): Patient will imitate to produce words with initial bilabial (/p, b/) consonants with modA cues. SLP Short Term Goal 5 (Week 2): Patient will demonstrate ability to use basic level 4-6 cell communication board to request during structured tasks and/or for basic needs with modA cues. SLP Short Term Goal 6 (Week 2): Patient will initiate to perform tasks such as brushing teeth, feeding self, etc. with mod A cues.  Skilled Therapeutic Interventions:   Patient seen for skilled ST session focusing on speech-language and swallow function goals. He was able to name 13 of 15 object pictures when given initial phoneme and partial phrase cues (roll the_____), etc. He imitated at one and two syllable word level with min-modA verbal and visual cues for attention and to initiate. SLP handed toothette swab soaked in water and patient then able to perform oral care of top and bottom teeth. He then consumed straw sips of thin liquids and a bite of cereal bar, exhibiting prolonged mastication but no significant amount of oral residuals and no overt s/s aspiration or penetration. Patient left in bed with all needs in reach and bed alarm set. He continues to benefit from skilled SLP intervention to maximize  cognitive-linguistic, speech ans swallow function prior to discharge.  Pain Pain Assessment Pain Scale: Faces Faces Pain Scale: No hurt  Therapy/Group: Individual Therapy  Angela Nevin, MA, CCC-SLP Speech Therapy

## 2020-11-06 NOTE — Progress Notes (Signed)
Occupational Therapy Session Note  Patient Details  Name: Raymond Kerr MRN: [id number] Date of Birth: [birth year]  Today's Date: 11/06/2020 OT Individual Time: 1105-1200; 1027-2536 OT Individual Time Calculation (min): 55 min ; 33 min   Short Term Goals: Week 2:  OT Short Term Goal 1 (Week 2): Pt will demonstrate improved initiation with leaning forward and using left hand on bar of stedy to move to stand with max A of 1. OT Short Term Goal 2 (Week 2): Pt will demonstrate improved sit balance to sit EOB with min A while bathing UB with min a. OT Short Term Goal 3 (Week 2): Pt will engage in RUE self ROM with mod A. OT Short Term Goal 4 (Week 2): Pt will don tshirt with mod A.  Skilled Therapeutic Interventions/Progress Updates:    First session: Pt semi reclined in bed, attempting to verbalize something however speaking very softly and therapist having difficulty understanding pt despite cues provided to increase volume.  Pt provided yes/no communication board and asked simple needs questions.  Pt shaking head no to washing up.  Pt pointing to "yes" when asked if wanting to use toilet.    Pt completed left roll and sideyling to sit with total assist +2.  Pt initially heavily pushing posteriorly and needing +2 assist to achieve forward trunk lean and hip flexion for functional sitting position. After a few moments of heavy pushing, pt able to sit upright with CGA.  Pt donned bilateral shoes with max assist.  Pt stood with bed elevated at stedy needing max assist +2 and step by step Vcs for forward leaning and hand placement.  Upon standing, pt heavily pushed to to right needing max multimodal cues and external targeting ("touch your left shoulder to my shoulder") to reduce pushing. With cues pt able to static stand with mod assist.  Transported to TIS w/c using stedy.  Transported using w/c to bathroom and attempted to stand at stedy for toilet transfer.  Pt attempted x 3 sit to stand at stedy  however pt pushing posteriorly during boost and unable to stand despite total assist +2 provided.  Maximove total assist transfer back to bed.  Therapist asked pt if he would like the bedpan for toileting and pt saying "no" very quietly.  Needs in reach, bed alarm on at end of session.  Throughout session, pt needed significantly increased time to process questions and instructions and appearing to have difficulty processing information at times to follow commands.  Very limited attention to right side of body including extremities and pushing posteriorly and to the right significantly which limits his independence with functional transfers and toileting.  May benefit from sit<>stands at [facility] using stedy to enable elevation of seated surface.    Second session:  Pt asleep in bed, reclined, needing positional change to arouse.  Total assist +2 to boost towards [facility] to enable raising into upright position.  Pt awakened after sitting upright (supported long sitting in bed) and sternal rub, increased lighting, and verbal cues provided.  Pt provided lunch tray and setup, however no initiation noted despite max multimodal cues given.  Forward chaining provided and pt able to bring spoon to mouth after therapist scooped small bite of food.  Pt furrowing brow and appearing to dislike food in his mouth.  Tried magic cup and pt agreeable to take two bites then declined more.  Asked pt if he wanted a bit of roll and he shook head no, however when therapist placed in  pts hand he brought it to his mouth and took a bite.  No pocketing of food noted at end.  Pt dozing off to sleep therefore discontinued further self feeding for safety. Provided educational handout above [facility] for safe positioning of RUE as well as donning of resting hand orthosis at night to prevent joint contractures.  Call bell in reach, bed alarm on.    Therapy Documentation Precautions:  Precautions Precautions: Fall Precaution Comments: R hemi, global  aphasia Restrictions Weight Bearing Restrictions: No   Therapy/Group: Individual Therapy  Amie Critchley 11/06/2020, 1:23 PM

## 2020-11-06 NOTE — Progress Notes (Signed)
Patient ID: Raymond Kerr, male   DOB: Jun 02, 1955, 65 y.o.   MRN: [id number]  Met with pt and left message with daughter-[name] to update regarding team conference progress and MD adjusting medications, due to lethargy but is much better today. Have contacted Chuck-regarding pt's medicare. Have not hear back form him. Will await daughter's return call.

## 2020-11-06 NOTE — Progress Notes (Signed)
PROGRESS NOTE   Subjective/Complaints: No new complaints this morning Verbalizing much better! Smiling and pleasant Being changed by MAs  ROS: Limited due to language/communication    Objective:   No results found. No results for input(s): WBC, HGB, HCT, PLT in the last 72 hours.   No results for input(s): NA, K, CL, CO2, GLUCOSE, BUN, CREATININE, CALCIUM in the last 72 hours.    Intake/Output Summary (Last 24 hours) at 11/06/2020 0855 Last data filed at 11/06/2020 0816 Gross per 24 hour  Intake 1000 ml  Output --  Net 1000 ml        Physical Exam: Vital Signs Blood pressure (!) 147/94, pulse 83, temperature 97.9 F (36.6 C), resp. rate 19, height 6' (1.829 m), weight 116.7 kg, SpO2 100 %. Gen: no distress, normal appearing HEENT: oral mucosa pink and moist, NCAT, left eye ptosis Cardio: Tachycardic Chest: normal effort, normal rate of breathing Abd: soft, non-distended Ext: no edema Neuro: more alert today, again following simple commands- able to wash his face with cloth. Right sided spasticity much improved- down to MAS 1 Psych: smiles in recognition, makes good eye contact.    Assessment/Plan: 1. Functional deficits which require 3+ hours per day of interdisciplinary therapy in a comprehensive inpatient rehab setting. Physiatrist is providing close team supervision and 24 hour management of active medical problems listed below. Physiatrist and rehab team continue to assess barriers to discharge/monitor patient progress toward functional and medical goals  Care Tool:  Bathing    Body parts bathed by patient: Face, Right arm, Chest, Abdomen, Front perineal area   Body parts bathed by helper: Buttocks, Right upper leg, Left upper leg, Left lower leg, Right lower leg, Left arm     Bathing assist Assist Level: Maximal Assistance - Patient 24 - 49%     Upper Body Dressing/Undressing Upper body dressing    What is the patient wearing?: Pull over shirt    Upper body assist Assist Level: Moderate Assistance - Patient 50 - 74%    Lower Body Dressing/Undressing Lower body dressing      What is the patient wearing?: Pants, Incontinence brief     Lower body assist Assist for lower body dressing: Total Assistance - Patient < 25% (bed level)     Toileting Toileting    Toileting assist Assist for toileting: 2 Helpers     Transfers Chair/bed transfer  Transfers assist  Chair/bed transfer activity did not occur: Safety/medical concerns  Chair/bed transfer assist level: Dependent - mechanical lift     Locomotion Ambulation   Ambulation assist   Ambulation activity did not occur: Safety/medical concerns          Walk 10 feet activity   Assist  Walk 10 feet activity did not occur: Safety/medical concerns        Walk 50 feet activity   Assist Walk 50 feet with 2 turns activity did not occur: Safety/medical concerns         Walk 150 feet activity   Assist Walk 150 feet activity did not occur: Safety/medical concerns         Walk 10 feet on uneven surface  activity   Assist  Walk 10 feet on uneven surfaces activity did not occur: Safety/medical concerns         Wheelchair     Assist Is the patient using a wheelchair?:  (yes, per PT note) Type of Wheelchair:  (manual per PT note) Wheelchair activity did not occur: Safety/medical concerns         Wheelchair 50 feet with 2 turns activity    Assist    Wheelchair 50 feet with 2 turns activity did not occur: Safety/medical concerns       Wheelchair 150 feet activity     Assist  Wheelchair 150 feet activity did not occur: Safety/medical concerns       Blood pressure (!) 147/94, pulse 83, temperature 97.9 F (36.6 C), resp. rate 19, height 6' (1.829 m), weight 116.7 kg, SpO2 100 %.    Medical Problem List and Plan: 1.  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  2.  Impaired mobility -DVT/anticoagulation:  Pharmaceutical: Continue Lovenox             -antiplatelet therapy: Aspirin 81 mg daily and Plavix 75 mg daily x90 days then aspirin alone 3. Pain Management: Tylenol as needed 4. Mood: Provide emotional support             -antipsychotic agents: N/A 5. Neuropsych: This patient is not capable of making decisions on his own behalf. 6. Skin/Wound Care: Routine skin checks 7. Fluids/Electrolytes/Nutrition: Routine in and outs with follow-up chemistries 8.  Dysphagia.  Mechanical soft thin liquids..  Patient limited p.o. intake.  Continue Megace to stimulate appetite follow-up speech therapy 9.  Hyperlipidemia. Continue Lipitor. LDL reviewed- 124.  10.  Diabetes mellitus.  Hemoglobin A1c 10.1.  CBGs 143-171: Semglee 10 units daily.  Diabetic teaching. Maintain metformin to 500mg  BID- will not increase further given nausea with higher dose. Monitor creatinine. Discontinue ISS.  Recent Labs    11/05/20 1707 11/05/20 2119 11/06/20 0607  GLUCAP 156* 162* 144*   9/14: Cbgs well controlled, continue to monitor 11.  Obesity.  BMI 35.37-->34.89.  Dietary follow-up. Discontinue feeding supplement.  12. Sub-optimal potassium: start 10/14 daily supplement. Continue supplement.  13. Right arm spasticity: Decrease Baclofen to 5mg  QID given lethargy, will try outpatient Botox.   -still with significant tone  -continue splinting, ROM 14. Lethargy: stat head CT ordered. WBC reviewed and normal. Decrease Baclofen to 5mg  4 times per day.  15. Transaminitis: d/c tylenol and monitor LFTs weekly. 16. Impaired initiation: good response- increase to 5mg  BID- will maintain this dose as do not want to  increase seizure risk given concurrent baclofen 16. Disposition: d/c 10/1 (29 days total). F/u with me on 10/21  LOS: 13 days A FACE TO FACE EVALUATION WAS PERFORMED  Raymond Kerr 11/06/2020, 8:55 AM

## 2020-11-07 LAB — CBC
HCT: 42.1 % (ref 39.0–52.0)
Hemoglobin: 14 g/dL (ref 13.0–17.0)
MCH: 28.9 pg (ref 26.0–34.0)
MCHC: 33.3 g/dL (ref 30.0–36.0)
MCV: 87 fL (ref 80.0–100.0)
Platelets: 283 10*3/uL (ref 150–400)
RBC: 4.84 MIL/uL (ref 4.22–5.81)
RDW: 14 % (ref 11.5–15.5)
WBC: 5.6 10*3/uL (ref 4.0–10.5)
nRBC: 0 % (ref 0.0–0.2)

## 2020-11-07 LAB — GLUCOSE, CAPILLARY
Glucose-Capillary: 125 mg/dL — ABNORMAL HIGH (ref 70–99)
Glucose-Capillary: 137 mg/dL — ABNORMAL HIGH (ref 70–99)
Glucose-Capillary: 168 mg/dL — ABNORMAL HIGH (ref 70–99)
Glucose-Capillary: 139 mg/dL — ABNORMAL HIGH (ref 70–99)

## 2020-11-07 LAB — COMPREHENSIVE METABOLIC PANEL
ALT: 127 U/L — ABNORMAL HIGH (ref 0–44)
AST: 115 U/L — ABNORMAL HIGH (ref 15–41)
Albumin: 3 g/dL — ABNORMAL LOW (ref 3.5–5.0)
Alkaline Phosphatase: 62 U/L (ref 38–126)
Anion gap: 9 (ref 5–15)
BUN: 19 mg/dL (ref 8–23)
CO2: 21 mmol/L — ABNORMAL LOW (ref 22–32)
Calcium: 9.5 mg/dL (ref 8.9–10.3)
Chloride: 106 mmol/L (ref 98–111)
Creatinine, Ser: 1.15 mg/dL (ref 0.61–1.24)
GFR, Estimated: >60 mL/min (ref >60)
Glucose, Bld: 135 mg/dL — ABNORMAL HIGH (ref 70–99)
Potassium: 3.9 mmol/L (ref 3.5–5.1)
Sodium: 136 mmol/L (ref 135–145)
Total Bilirubin: 0.7 mg/dL (ref 0.3–1.2)
Total Protein: 6.9 g/dL (ref 6.5–8.1)

## 2020-11-07 MED ORDER — ENSURE ENLIVE PO LIQD
237.0000 mL | Freq: Two times a day (BID) | ORAL | Status: DC
  Administered 2020-11-08 – 2020-11-09 (×3): 237 mL via ORAL

## 2020-11-07 NOTE — Plan of Care (Signed)
  Problem: Consults Goal: RH STROKE PATIENT EDUCATION Description: See Patient Education module for education specifics  Outcome: Progressing   Problem: RH BOWEL ELIMINATION Goal: RH STG MANAGE BOWEL WITH ASSISTANCE Description: STG Manage Bowel with mod I Assistance. Outcome: Progressing Goal: RH STG MANAGE BOWEL W/MEDICATION W/ASSISTANCE Description: STG Manage Bowel with Medication with mod I Assistance. Outcome: Progressing   Problem: RH SKIN INTEGRITY Goal: RH STG SKIN FREE OF INFECTION/BREAKDOWN Description: With min assist Outcome: Progressing Goal: RH STG MAINTAIN SKIN INTEGRITY WITH ASSISTANCE Description: STG Maintain Skin Integrity With min Assistance. Outcome: Progressing Goal: RH STG ABLE TO PERFORM INCISION/WOUND CARE W/ASSISTANCE Description: STG Able To Perform Incision/Wound Care With min Assistance. Outcome: Progressing   Problem: RH SAFETY Goal: RH STG ADHERE TO SAFETY PRECAUTIONS W/ASSISTANCE/DEVICE Description: STG Adhere to Safety Precautions With cues Assistance/Device. Outcome: Progressing   Problem: RH KNOWLEDGE DEFICIT Goal: RH STG INCREASE KNOWLEDGE OF DIABETES Description: Patient will be able to manage DM with medications and dietary modifications using handouts and educational resources w cues/reminders Outcome: Progressing Goal: RH STG INCREASE KNOWLEDGE OF HYPERTENSION Description: Patient will be able to manage HTN with medications and dietary modifications using handouts and educational resources w cues/reminders  Outcome: Progressing Goal: RH STG INCREASE KNOWLEDGE OF DYSPHAGIA/FLUID INTAKE Description: Patient will be able to manage Dysphagia, medications management and dietary modifications using handouts and educational resources w cues Outcome: Progressing Goal: RH STG INCREASE KNOWLEGDE OF HYPERLIPIDEMIA Description: Patient will be able to manage HLD with medications and dietary modifications using handouts and educational resources  independently Outcome: Progressing Goal: RH STG INCREASE KNOWLEDGE OF STROKE PROPHYLAXIS Description: Patient will be able to manage secondary stroke risks with medications and dietary modifications using handouts and educational resources independently Outcome: Progressing

## 2020-11-07 NOTE — Progress Notes (Signed)
Physical Therapy Session Note  Patient Details  Name: Raymond Kerr MRN: [id number] Date of Birth: 22-Jun-1955  Today's Date: 11/07/2020 PT Individual Time: 1015-1109 PT Individual Time Calculation (min): 54 min   Short Term Goals: Week 2:  PT Short Term Goal 1 (Week 2): Pt will complete bed mobility with maxA of 1 person PT Short Term Goal 2 (Week 2): Pt will complete dynamic sitting balance tasks with maxA for balance PT Short Term Goal 3 (Week 2): Pt will complete bed<>chair transfers with maxA of 1 person PT Short Term Goal 4 (Week 2): Pt will maintain static standing for 2 minutes or greater with maxA of 1 person  Skilled Therapeutic Interventions/Progress Updates:     Pt greeted supine in bed to start session - does not appear to be in any pain but aphasia limiting. He's awake and alert during session, remains nonverbal but there were a few attempts at soft vocalizations. Donned knee-high TED's and shoes with totalA in bed. Required +2 totalA for donning pants with rolling. Able to roll towards the R with modA and requires +2 totalA for rolling L. Supine<>sit completed with +2 totalA with HOB flat. Requires modA for sitting balance due to posterior lean - progresses to SBA with cues. Completes squat<>pivot transfer with +2 maxA from EOB to TIS w/c - improved initiation in posterior chain from patient compared to prior sessions. Pt wheeled to main rehab gym for time. Assisted to therapy mat table in similar manner with +2 maxA via squat<>pivot transfer. At edge of mat, worked on sit<>stand transfers with +2 maxA via 3-muskateer''s technique and having mirror for visual aid. Worked on static standing, standing lateral weight shifts, standing cone reaching/stacking on L to promote L weight shift (pusher to the paretic R side), and standing reaching for horseshoe on mirror. All activities required +2 maxA for standing balance and facilitating upright/neutral posture. Pt with limited weight bearing  on paretic R side and had difficulty with equal weight shift despite cues and facilitation. Multiple sit<>stands completed during these activities with seated rest breaks needed. Pt assisted back to his TIS w/c via squat<>pivot with +2 maxA and then returned to his room. Remained slightly reclined in w/c at end of session with safety belt alarm on and call bell in reach.   Therapy Documentation Precautions:  Precautions Precautions: Fall Precaution Comments: R hemi, global aphasia Restrictions Weight Bearing Restrictions: No General:    Therapy/Group: Individual Therapy  Kristol Almanzar P Castella Lerner 11/07/2020, 7:59 AM

## 2020-11-07 NOTE — Progress Notes (Signed)
Speech Language Pathology Weekly Progress and Session Note  Patient Details  Name: Raymond Kerr MRN: [id number] Date of Birth: June 09, 1955  Beginning of progress report period: 10/31/2020 End of progress report period:  11/07/2020 Today's Date: 11/07/2020 SLP Individual Time: 9323-5573 SLP Individual Time Calculation (min): 55 min  Short Term Goals: Week 2: SLP Short Term Goal 1 (Week 2): Patient will tolerate current diet of Dys 3 solids, thin liquids with modA cues swallow safety and clearing oral cavity. SLP Short Term Goal 1 - Progress (Week 2): Met SLP Short Term Goal 2 (Week 2): Patient will imitate at 2-3 syllable word level with mod-maxA cues to initiate and achieve adequate voicing. SLP Short Term Goal 2 - Progress (Week 2): Met SLP Short Term Goal 3 (Week 2): Patient will point to identify object pictures/photos in field of 3-4 with 80% accuracy and modA cues. SLP Short Term Goal 3 - Progress (Week 2): Progressing toward goal SLP Short Term Goal 4 (Week 2): Patient will imitate to produce words with initial bilabial (/p, b/) consonants with modA cues. SLP Short Term Goal 4 - Progress (Week 2): Progressing toward goal SLP Short Term Goal 5 (Week 2): Patient will demonstrate ability to use basic level 4-6 cell communication board to request during structured tasks and/or for basic needs with modA cues. SLP Short Term Goal 5 - Progress (Week 2): Not met SLP Short Term Goal 6 (Week 2): Patient will initiate to perform tasks such as brushing teeth, feeding self, etc. with mod A cues. SLP Short Term Goal 6 - Progress (Week 2): Met    New Short Term Goals: Week 3: SLP Short Term Goal 1 (Week 3): Patient will demonstrate efficient mastication and oral clearance of dysphagia 3 solids with modA verbal, visual and tactile cues. SLP Short Term Goal 2 (Week 3): Patient will point to identify object pictures/photos in field of 4 when named or basic function/category described with 80% accuracy  and modA tactile and verbal cues to initiate pointing. SLP Short Term Goal 3 (Week 3): Patient will name basic level objects/object pictures/photos at word level with modA phrase completion cues, initial phoneme cues. SLP Short Term Goal 4 (Week 3): Patient will initiate to perform gestures (pointing) and for performing basic functional tasks (toothbrushing, self-feeding) with min-modA verbal and tactile cues. SLP Short Term Goal 5 (Week 3): Patient will respond with yes/no verbal, gestural or yes/no communication board to basic/immediate needs questions with minA to initiate reponse.  Weekly Progress Updates:  Patient met 3/6 STG's and is making some progress to 2 of the 3 goals he did not meet. Unfortunately, patient did exhibit some apparent regression in performance however on 9/16 and 9/15 sessions, patient exhibiting improvements and per MD, medication changes have been made to improve his alertness. He continues to require modA cues to initiate to perform tasks and attempt verbal responses. Vocal intensity is very low but he is able to name some objects and object pictures with partial phrase and initial phoneme cues. He continues to benefit from skilled SLP intervention to maximize speech-language and swallow function goals prior to discharge.   Intensity: Minumum of 1-2 x/day, 30 to 90 minutes Frequency: 3 to 5 out of 7 days Duration/Length of Stay: 10/1 Treatment/Interventions: Cognitive remediation/compensation;Dysphagia/aspiration precaution training;Internal/external aids;Speech/Language facilitation;Therapeutic Activities;Cueing hierarchy;Functional tasks;Multimodal communication approach;Patient/family education;Therapeutic Exercise   Daily Session  Skilled Therapeutic Interventions: Patient seen for skilled ST session. Patient in bed but awake and alert when SLP entered room. He was demonstrating head  turning and more visual tracking of SLP with verbal cues to initiate. He was able  to name 10 of 13 object pictures/photos with phrase completion and initial phoneme cues. He did name 2 verb/action pictures, one of which he described at 2-word phrase level "brush teeth". He performed oral care after SLP setup and handed him toothbrush, initially putting wrong end in mouth but then self-correcting. Patient continues to benefit from skilled SLP intervention to maximize speech-language and swallow function goals prior to discharge. He was left in bed with needs in reach.   General    Pain Pain Assessment Pain Scale: Faces Faces Pain Scale: No hurt  Therapy/Group: Individual Therapy  Sonia Baller, MA, CCC-SLP Speech Therapy

## 2020-11-07 NOTE — Progress Notes (Signed)
PROGRESS NOTE   Subjective/Complaints: More alert and participatory in sessions.  Still struggling with expressive aphasia, receptive much improved Tightness in right side improved  ROS: Limited due to language/communication    Objective:   No results found. Recent Labs    11/07/20 0510  WBC 5.6  HGB 14.0  HCT 42.1  PLT 283     Recent Labs    11/07/20 0510  NA 136  K 3.9  CL 106  CO2 21*  GLUCOSE 135*  BUN 19  CREATININE 1.15  CALCIUM 9.5      Intake/Output Summary (Last 24 hours) at 11/07/2020 0918 Last data filed at 11/07/2020 0700 Gross per 24 hour  Intake 400 ml  Output --  Net 400 ml        Physical Exam: Vital Signs Blood pressure (!) 143/72, pulse 80, temperature 97.9 F (36.6 C), resp. rate 19, height 6' (1.829 m), weight 116.7 kg, SpO2 100 %. Gen: no distress, normal appearing HEENT: oral mucosa pink and moist, NCAT, left eye ptosis Cardio: Tachycardic Chest: normal effort, normal rate of breathing Abd: soft, non-distended Ext: no edema Neuro: more alert today, again following simple commands- able to wash his face with cloth. Right sided spasticity much improved- down to MAS 1 Psych: smiles in recognition, makes good eye contact.    Assessment/Plan: 1. Functional deficits which require 3+ hours per day of interdisciplinary therapy in a comprehensive inpatient rehab setting. Physiatrist is providing close team supervision and 24 hour management of active medical problems listed below. Physiatrist and rehab team continue to assess barriers to discharge/monitor patient progress toward functional and medical goals  Care Tool:  Bathing    Body parts bathed by patient: Face, Right arm, Chest, Abdomen, Front perineal area   Body parts bathed by helper: Buttocks, Right upper leg, Left upper leg, Left lower leg, Right lower leg, Left arm     Bathing assist Assist Level: Maximal  Assistance - Patient 24 - 49%     Upper Body Dressing/Undressing Upper body dressing   What is the patient wearing?: Pull over shirt    Upper body assist Assist Level: Moderate Assistance - Patient 50 - 74%    Lower Body Dressing/Undressing Lower body dressing      What is the patient wearing?: Pants, Incontinence brief     Lower body assist Assist for lower body dressing: Total Assistance - Patient < 25% (bed level)     Toileting Toileting    Toileting assist Assist for toileting: 2 Helpers     Transfers Chair/bed transfer  Transfers assist  Chair/bed transfer activity did not occur: Safety/medical concerns  Chair/bed transfer assist level: Dependent - mechanical lift     Locomotion Ambulation   Ambulation assist   Ambulation activity did not occur: Safety/medical concerns          Walk 10 feet activity   Assist  Walk 10 feet activity did not occur: Safety/medical concerns        Walk 50 feet activity   Assist Walk 50 feet with 2 turns activity did not occur: Safety/medical concerns         Walk 150 feet activity  Assist Walk 150 feet activity did not occur: Safety/medical concerns         Walk 10 feet on uneven surface  activity   Assist Walk 10 feet on uneven surfaces activity did not occur: Safety/medical concerns         Wheelchair     Assist Is the patient using a wheelchair?:  (yes, per PT note) Type of Wheelchair:  (manual per PT note) Wheelchair activity did not occur: Safety/medical concerns         Wheelchair 50 feet with 2 turns activity    Assist    Wheelchair 50 feet with 2 turns activity did not occur: Safety/medical concerns       Wheelchair 150 feet activity     Assist  Wheelchair 150 feet activity did not occur: Safety/medical concerns       Blood pressure (!) 143/72, pulse 80, temperature 97.9 F (36.6 C), resp. rate 19, height 6' (1.829 m), weight 116.7 kg, SpO2 100 %.     Medical Problem List and Plan: 1.  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  2.  Impaired mobility -DVT/anticoagulation:  Pharmaceutical: Continue Lovenox             -antiplatelet therapy: Aspirin 81 mg daily and Plavix 75 mg daily x90 days then aspirin alone 3. Pain Management: Tylenol as needed 4. Mood: Provide emotional support             -antipsychotic agents: N/A 5. Neuropsych: This patient is not capable of making decisions on his own behalf. 6. Skin/Wound Care: Routine skin checks 7. Fluids/Electrolytes/Nutrition: Routine in and outs with follow-up chemistries 8.  Dysphagia.  Mechanical soft thin liquids..  Patient limited p.o. intake.  Continue Megace to stimulate appetite follow-up speech therapy 9.  Hyperlipidemia. Continue Lipitor. LDL reviewed- 124.  10.  Diabetes mellitus.  Hemoglobin A1c 10.1.  CBGs 143-171: Semglee 10 units daily.  Diabetic teaching. Maintain metformin to 500mg  BID- will not increase further given nausea with higher dose. Monitor creatinine. Discontinue ISS.  Recent Labs    11/06/20 1637 11/06/20 2058 11/07/20 0614  GLUCAP 137* 161* 125*   Cbgs well controlled, continue to monitor 11.  Obesity.  BMI 35.37-->34.89.  Dietary follow-up. Discontinue feeding supplement.  12. Sub-optimal potassium: start 11/09/20 daily supplement. Continue supplement.  13. Right arm spasticity: Decrease Baclofen to 5mg  QID given lethargy, will try outpatient Botox.   -tone improved with current dose, higher doses cause lethargy.   -continue splinting, ROM 14. Lethargy: resolved with decrease in baclofen- continue current dose which he is tolerating 15. Transaminitis: d/c tylenol and monitor LFTs weekly. 16.  Impaired initiation: good response to Ritalin- increase to 5mg  BID- will maintain this dose as do not want to increase seizure risk given concurrent baclofen 16. Disposition: d/c 10/1 (29 days total). F/u with me on 10/21  LOS: 14 days A FACE TO FACE EVALUATION WAS PERFORMED  P Aries Kasa 11/07/2020, 9:18 AM

## 2020-11-07 NOTE — Progress Notes (Signed)
Occupational Therapy Session Note  Patient Details  Name: Raymond Kerr MRN: [id number] Date of Birth: 02-Aug-1955  Today's Date: 11/07/2020 OT Individual Time: 1130-1230 OT Individual Time Calculation (min): 60 min    Short Term Goals: Week 2:  OT Short Term Goal 1 (Week 2): Pt will demonstrate improved initiation with leaning forward and using left hand on bar of stedy to move to stand with max A of 1. OT Short Term Goal 1 - Progress (Week 2): Not progressing OT Short Term Goal 2 (Week 2): Pt will demonstrate improved sit balance to sit EOB with min A while bathing UB with min a. OT Short Term Goal 2 - Progress (Week 2): Progressing toward goal OT Short Term Goal 3 (Week 2): Pt will engage in RUE self ROM with mod A. OT Short Term Goal 3 - Progress (Week 2): Met OT Short Term Goal 4 (Week 2): Pt will don tshirt with mod A. OT Short Term Goal 4 - Progress (Week 2): Progressing toward goal OT Short Term Goal 5 (Week 2): Pt will demonstrate improved sit balance to sit EOB with min A while bathing UB with min a. Week 3:  OT Short Term Goal 1 (Week 3): Pt will don tshirt with mod A. OT Short Term Goal 2 (Week 3): Pt will demonstrate improved sit balance to sit EOB with min A while bathing UB with min a. OT Short Term Goal 3 (Week 3): Pt will scan plate and initiate self feeding using left hand with supervision. OT Short Term Goal 4 (Week 3): Pt will bathe UB with min assist.  Skilled Therapeutic Interventions/Progress Updates:    First session: Pt sitting up in TIS w/c, flat affect and relatively nonverbal throughout session.  Pt required max multimodal cues throughout to complete UB bathing and dressing.  Max assist to doff and donn shirt overhead.  Max assist with hand over hand and manual support of RUE to wash left side of body.  Pt did initiate brushing teeth when handed toothbrush with toothpaste applied and completed entire sequence without cues.  Pt did need mod manual assist for  forward leaning to maintain functional seated position while completing oral care.  Pt setup with meal tray after self care and instructed to self feed desired food with spoon.  Pt did not scan plate despite max multimodal cues and did not initiate self feeding.  When spoon with scooped food placed in left hand pt did bring to his mouth x 5 trials of various foods.  Also able to bring cup to mouth with straw when placed in his hand. Direct hand off to nurse tech at end of session.  Second Session: Pt very drowsy this session with difficulty arousing despite sternal rub and multimodal cues.  +2 total assist to transfer to bed from TIS w/c using maximove hoyer lift.  Pt more alert after transfer to bed therefore educated pt on self PROM technique RUE needing mod assist to complete (shoulder FF to 90 degrees and elbow extension).  Pt completed 10 reps with 10 second holds.  Call bell in reach, bed alarm on at end of session.  Therapy Documentation Precautions:  Precautions Precautions: Fall Precaution Comments: R hemi, global aphasia Restrictions Weight Bearing Restrictions: No    Therapy/Group: Individual Therapy  Ezekiel Slocumb 11/07/2020, 3:50 PM

## 2020-11-08 LAB — GLUCOSE, CAPILLARY
Glucose-Capillary: 132 mg/dL — ABNORMAL HIGH (ref 70–99)
Glucose-Capillary: 111 mg/dL — ABNORMAL HIGH (ref 70–99)
Glucose-Capillary: 132 mg/dL — ABNORMAL HIGH (ref 70–99)
Glucose-Capillary: 169 mg/dL — ABNORMAL HIGH (ref 70–99)

## 2020-11-08 NOTE — Progress Notes (Signed)
Speech Language Pathology Daily Session Note  Patient Details  Name: Raymond Kerr MRN: [id number] Date of Birth: [birth year]  Today's Date: 11/08/2020 SLP Individual Time: 0750-0830 SLP Individual Time Calculation (min): 40 min  Short Term Goals: Week 3: SLP Short Term Goal 1 (Week 3): Patient will demonstrate efficient mastication and oral clearance of dysphagia 3 solids with modA verbal, visual and tactile cues. SLP Short Term Goal 2 (Week 3): Patient will point to identify object pictures/photos in field of 4 when named or basic function/category described with 80% accuracy and modA tactile and verbal cues to initiate pointing. SLP Short Term Goal 3 (Week 3): Patient will name basic level objects/object pictures/photos at word level with modA phrase completion cues, initial phoneme cues. SLP Short Term Goal 4 (Week 3): Patient will initiate to perform gestures (pointing) and for performing basic functional tasks (toothbrushing, self-feeding) with min-modA verbal and tactile cues. SLP Short Term Goal 5 (Week 3): Patient will respond with yes/no verbal, gestural or yes/no communication board to basic/immediate needs questions with minA to initiate reponse.  Skilled Therapeutic Interventions: Skilled treatment session focused on dysphagia and communication goals. Upon arrival, patient was awake in bed. SLP facilitated session by providing tray set-up with breakfast meal of Dys. 3 textures with thin liquids. Patient self-fed initial bite with Mod verbal and contextual cues, however, after the first bite, the patient pushed the table away. SLP attempted to assist in feeding the patient with him not opening his oral cavity and turning his head. Patient agreeable to a banana and consumed ~4 bites. No overt s/s of aspiration noted with any consistency, however, prolonged mastication was noted. Throughout session, patient utilized gestures and verbalizations to make needs known. SLP attempted to use the  yes/no communication board with minimal success. RN and NT made aware of minimal PO intake. Patient left upright in bed with alarm on and all needs within reach. Continue with current plan of care.      Pain No/Denies Pain   Therapy/Group: Individual Therapy  [name] 11/08/2020, 8:33 AM

## 2020-11-08 NOTE — Plan of Care (Signed)
  Problem: Consults Goal: RH STROKE PATIENT EDUCATION Description: See Patient Education module for education specifics  Outcome: Progressing   Problem: RH BOWEL ELIMINATION Goal: RH STG MANAGE BOWEL WITH ASSISTANCE Description: STG Manage Bowel with mod I Assistance. Outcome: Not Progressing Goal: RH STG MANAGE BOWEL W/MEDICATION W/ASSISTANCE Description: STG Manage Bowel with Medication with mod I Assistance. Outcome: Progressing   Problem: RH SKIN INTEGRITY Goal: RH STG SKIN FREE OF INFECTION/BREAKDOWN Description: With min assist Outcome: Progressing Goal: RH STG MAINTAIN SKIN INTEGRITY WITH ASSISTANCE Description: STG Maintain Skin Integrity With min Assistance. Outcome: Progressing Goal: RH STG ABLE TO PERFORM INCISION/WOUND CARE W/ASSISTANCE Description: STG Able To Perform Incision/Wound Care With min Assistance. Outcome: Progressing   Problem: RH SAFETY Goal: RH STG ADHERE TO SAFETY PRECAUTIONS W/ASSISTANCE/DEVICE Description: STG Adhere to Safety Precautions With cues Assistance/Device. Outcome: Progressing   Problem: RH KNOWLEDGE DEFICIT Goal: RH STG INCREASE KNOWLEDGE OF DIABETES Description: Patient will be able to manage DM with medications and dietary modifications using handouts and educational resources w cues/reminders Outcome: Progressing Goal: RH STG INCREASE KNOWLEDGE OF HYPERTENSION Description: Patient will be able to manage HTN with medications and dietary modifications using handouts and educational resources w cues/reminders  Outcome: Progressing Goal: RH STG INCREASE KNOWLEDGE OF DYSPHAGIA/FLUID INTAKE Description: Patient will be able to manage Dysphagia, medications management and dietary modifications using handouts and educational resources w cues Outcome: Progressing Goal: RH STG INCREASE KNOWLEGDE OF HYPERLIPIDEMIA Description: Patient will be able to manage HLD with medications and dietary modifications using handouts and educational resources  independently Outcome: Progressing Goal: RH STG INCREASE KNOWLEDGE OF STROKE PROPHYLAXIS Description: Patient will be able to manage secondary stroke risks with medications and dietary modifications using handouts and educational resources independently Outcome: Progressing

## 2020-11-08 NOTE — Progress Notes (Signed)
Physical Therapy Session Note  Patient Details  Name: Raymond Kerr MRN: [id number] Date of Birth: [birth year]  Today's Date: 11/08/2020 PT Individual Time: 1005-1045 PT Individual Time Calculation (min): 40 min   Short Term Goals:  Week 2:  PT Short Term Goal 1 (Week 2): Pt will complete bed mobility with maxA of 1 person PT Short Term Goal 2 (Week 2): Pt will complete dynamic sitting balance tasks with maxA for balance PT Short Term Goal 3 (Week 2): Pt will complete bed<>chair transfers with maxA of 1 person PT Short Term Goal 4 (Week 2): Pt will maintain static standing for 2 minutes or greater with maxA of 1 person Week 3:     Skilled Therapeutic Interventions/Progress Updates:   Pt received supine in bed. PT performed AAROM/PROM RLE hip/knee flexion/extension 2 x 12  and RUE elbow extension x 10 wih prolonged hold at end range.   Supine trunkal NMR: Rolling R and L performed with contralateral LE in flexion and reaching in PNF D1 pattern with the UE. Performed x 3 Bil with mod assist to the R and max assist to the The L:   LLE AAROM hip abduction/adduction x 10, hip/knee flexion/extension x 10, SLR x 10. Ankle PF/DF x 20. Pt falling asleep between exerices and nodding Yes when asked if pt needed to sleep. Pt left supine in bed with call bell in reach with all needs met.        Therapy Documentation Precautions:  Precautions Precautions: Fall Precaution Comments: R hemi, global aphasia Restrictions Weight Bearing Restrictions: No General: PT Amount of Missed Time (min): 20 Minutes PT Missed Treatment Reason: Patient fatigue    Pain: Pain Assessment Pain Scale: Faces Faces Pain Scale: No hurt    Therapy/Group: Individual Therapy  Lorie Phenix 11/08/2020, 11:12 AM

## 2020-11-09 DIAGNOSIS — E1165 Type 2 diabetes mellitus with hyperglycemia: Secondary | ICD-10-CM

## 2020-11-09 DIAGNOSIS — G8110 Spastic hemiplegia affecting unspecified side: Secondary | ICD-10-CM

## 2020-11-09 DIAGNOSIS — I69951 Hemiplegia and hemiparesis following unspecified cerebrovascular disease affecting right dominant side: Secondary | ICD-10-CM

## 2020-11-09 DIAGNOSIS — I10 Essential (primary) hypertension: Secondary | ICD-10-CM

## 2020-11-09 LAB — GLUCOSE, CAPILLARY
Glucose-Capillary: 150 mg/dL — ABNORMAL HIGH (ref 70–99)
Glucose-Capillary: 152 mg/dL — ABNORMAL HIGH (ref 70–99)
Glucose-Capillary: 162 mg/dL — ABNORMAL HIGH (ref 70–99)
Glucose-Capillary: 172 mg/dL — ABNORMAL HIGH (ref 70–99)

## 2020-11-09 MED ORDER — MIRTAZAPINE 15 MG PO TABS
7.5000 mg | ORAL_TABLET | Freq: Every day | ORAL | Status: DC
  Administered 2020-11-09 – 2020-11-10 (×2): 7.5 mg via ORAL
  Filled 2020-11-09 (×3): qty 1

## 2020-11-09 MED ORDER — ENSURE ENLIVE PO LIQD
237.0000 mL | Freq: Three times a day (TID) | ORAL | Status: DC
  Administered 2020-11-09 – 2020-11-10 (×3): 237 mL via ORAL

## 2020-11-09 MED ORDER — AMLODIPINE BESYLATE 2.5 MG PO TABS
2.5000 mg | ORAL_TABLET | Freq: Every day | ORAL | Status: DC
  Administered 2020-11-09 – 2020-11-10 (×2): 2.5 mg via ORAL
  Filled 2020-11-09 (×2): qty 1

## 2020-11-09 NOTE — Progress Notes (Signed)
Occupational Therapy Session Note  Patient Details  Name: Raymond Kerr MRN: [id number] Date of Birth: Jan 15, 1956  Today's Date: 11/09/2020 OT Individual Time: 9024-0973 OT Individual Time Calculation (min): 55 min    Short Term Goals: Week 3:  OT Short Term Goal 1 (Week 3): Pt will don tshirt with mod A. OT Short Term Goal 2 (Week 3): Pt will demonstrate improved sit balance to sit EOB with min A while bathing UB with min a. OT Short Term Goal 3 (Week 3): Pt will scan plate and initiate self feeding using left hand with supervision. OT Short Term Goal 4 (Week 3): Pt will bathe UB with min assist.  Skilled Therapeutic Interventions/Progress Updates:    Pt resting in bed upon arrival. Pt minimally verbal when responding to simple questions but agreeable to UB bathing/dressing at bed level, bed mobility, and RUE PROM and SROM. UB bathing with min A to bathe LUE. Pt required max A to doff shirt and don clean paper scrub. Pt initiated donning shirt but had to be redirected to use hemi dressing technique. Pt used LUE to pull up from bed rails to facilitate pulling shirt down in back. RUE PROM and pt instructed in SROM. Pt required max verbal cues when return demonstrating. Pt required max verbal cues to scan to his Lt and Rt when presented with wash cloth and shirt. Max A for bed mobility. Pt remained in bed with bed in chair position. Bil bed rails in up postion and bed alarm activated. All needs within reach.   Therapy Documentation Precautions:  Precautions Precautions: Fall Precaution Comments: R hemi, global aphasia Restrictions Weight Bearing Restrictions: No  Pain:  Pt initially denied pain but grimaced with PROM of Rt shoulder at end range flexion; repositioned   Therapy/Group: Individual Therapy  Rich Brave 11/09/2020, 2:29 PM

## 2020-11-09 NOTE — Progress Notes (Signed)
Initial Nutrition Assessment  DOCUMENTATION CODES:   Obesity unspecified  INTERVENTION:  Provide Ensure Enlive po TID, each supplement provides 350 kcal and 20 grams of protein  Encourage adequate PO intake.   NUTRITION DIAGNOSIS:   Increased nutrient needs related to acute illness as evidenced by estimated needs.  GOAL:   Patient will meet greater than or equal to 90% of their needs  MONITOR:   PO intake, Supplement acceptance, Skin, Weight trends, Labs, I & O's  REASON FOR ASSESSMENT:   Malnutrition Screening Tool    ASSESSMENT:   65 year old right-handed male with history of hypertension, diabetes mellitus as well as hyperlipidemia. Presented 10/06/2020 with dysarthria, diplopia, incoordination and right facial drop. Cranial CT scan showed low-density area within the left thalamus and right frontal lobe compatible with subacute to chronic infarcts. MRI /MRA small acute infarcts of the left paramedian frontal lobe and ventral medial left thalamus. Repeat MRI revealed extension of both his left frontal and brainstem infarcts as well as new watershed infarct in the internal border zone on the left. Therapy evaluations completed due to patient's aphasia and decreased functional mobility was admitted for a comprehensive rehab program.  Pt unavailable during attempted time of contact. Pt is currently on a dysphagia 3 diet with thin liquids. Meal completion has been 25-50%. Pt currently has Ensure ordered and has been consuming them. RD to continue with current orders to aid in caloric and protein needs. Unable to complete Nutrition-Focused physical exam at this time.   Labs and medications reviewed. Megace and remeron ordered.   Diet Order:   Diet Order             DIET DYS 3 Room service appropriate? Yes with Assist; Fluid consistency: Thin  Diet effective now                   EDUCATION NEEDS:   Not appropriate for education at this time  Skin:  Skin Assessment:  Reviewed RN Assessment  Last BM:  9/16  Height:   Ht Readings from Last 1 Encounters:  10/24/20 6' (1.829 m)    Weight:   Wt Readings from Last 1 Encounters:  11/07/20 109.9 kg   BMI:  Body mass index is 32.86 kg/m.  Estimated Nutritional Needs:   Kcal:  2150-2350  Protein:  115-130 grams  Fluid:  >/= 2 L/day  Roslyn Smiling, MS, RD, LDN RD pager number/after hours weekend pager number on Amion.

## 2020-11-09 NOTE — Progress Notes (Signed)
PROGRESS NOTE   Subjective/Complaints: Patient seen laying in bed this morning.  He does not engage or make eye contact.  No reported issues overnight.  ROS: Limited due to language/cognition   Objective:   No results found. Recent Labs    11/07/20 0510  WBC 5.6  HGB 14.0  HCT 42.1  PLT 283      Recent Labs    11/07/20 0510  NA 136  K 3.9  CL 106  CO2 21*  GLUCOSE 135*  BUN 19  CREATININE 1.15  CALCIUM 9.5       Intake/Output Summary (Last 24 hours) at 11/09/2020 0921 Last data filed at 11/09/2020 0700 Gross per 24 hour  Intake 220 ml  Output --  Net 220 ml         Physical Exam: Vital Signs Blood pressure (!) 158/95, pulse 90, temperature 97.7 F (36.5 C), temperature source Oral, resp. rate 18, height 6' (1.829 m), weight 109.9 kg, SpO2 98 %. Constitutional: No distress . Vital signs reviewed. HENT: Normocephalic.  Atraumatic. Eyes: Left eye ptosis. No discharge. Cardiovascular: No JVD.  RRR. Respiratory: Normal effort.  No stridor.  Bilateral clear to auscultation. GI: Non-distended.  BS +. Skin: Warm and dry.  Intact. Psych: Flat.  Disengaged. Musc: No edema in extremities.  No tenderness in extremities. Neuro: Alert Global aphasia Does not make eye contact, does not engage or follow commands Tone appears to be improving  Assessment/Plan: 1. Functional deficits which require 3+ hours per day of interdisciplinary therapy in a comprehensive inpatient rehab setting. Physiatrist is providing close team supervision and 24 hour management of active medical problems listed below. Physiatrist and rehab team continue to assess barriers to discharge/monitor patient progress toward functional and medical goals  Care Tool:  Bathing    Body parts bathed by patient: Face, Right arm, Chest, Abdomen, Front perineal area   Body parts bathed by helper: Buttocks, Right upper leg, Left upper leg, Left  lower leg, Right lower leg, Left arm     Bathing assist Assist Level: Maximal Assistance - Patient 24 - 49%     Upper Body Dressing/Undressing Upper body dressing   What is the patient wearing?: Pull over shirt    Upper body assist Assist Level: Moderate Assistance - Patient 50 - 74%    Lower Body Dressing/Undressing Lower body dressing      What is the patient wearing?: Pants, Incontinence brief     Lower body assist Assist for lower body dressing: Total Assistance - Patient < 25% (bed level)     Toileting Toileting    Toileting assist Assist for toileting: 2 Helpers     Transfers Chair/bed transfer  Transfers assist  Chair/bed transfer activity did not occur: Safety/medical concerns  Chair/bed transfer assist level: 2 Helpers     Locomotion Ambulation   Ambulation assist   Ambulation activity did not occur: Safety/medical concerns          Walk 10 feet activity   Assist  Walk 10 feet activity did not occur: Safety/medical concerns        Walk 50 feet activity   Assist Walk 50 feet with 2 turns activity did  not occur: Safety/medical concerns         Walk 150 feet activity   Assist Walk 150 feet activity did not occur: Safety/medical concerns         Walk 10 feet on uneven surface  activity   Assist Walk 10 feet on uneven surfaces activity did not occur: Safety/medical concerns         Wheelchair     Assist Is the patient using a wheelchair?:  (yes, per PT note) Type of Wheelchair:  (manual per PT note) Wheelchair activity did not occur: Safety/medical concerns         Wheelchair 50 feet with 2 turns activity    Assist    Wheelchair 50 feet with 2 turns activity did not occur: Safety/medical concerns       Wheelchair 150 feet activity     Assist  Wheelchair 150 feet activity did not occur: Safety/medical concerns       Blood pressure (!) 158/95, pulse 90, temperature 97.7 F (36.5 C),  temperature source Oral, resp. rate 18, height 6' (1.829 m), weight 109.9 kg, SpO2 98 %.    Medical Problem List and Plan: 1.  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  Impaired mobility -DVT/anticoagulation:  Pharmaceutical: Continue Lovenox             -antiplatelet therapy: Aspirin 81 mg daily and Plavix 75 mg daily x90 days then aspirin alone 3. Pain Management: Tylenol as needed 4. Mood: Provide emotional support             -antipsychotic agents: N/A 5. Neuropsych: This patient is not capable of making decisions on his own behalf. 6. Skin/Wound Care: Routine skin checks 7. Fluids/Electrolytes/Nutrition: Routine in and outs 8.  Post stroke dysphagia.  Mechanical soft thin liquids..  Patient limited p.o. intake.   Continue Megace to stimulate appetite follow-up speech therapy Remeraon added on 9/18 9.  Hyperlipidemia. Continue Lipitor. LDL reviewed- 124.  10. Uncontrolled Diabetes mellitus with hyperglycemia.  Hemoglobin A1c 10.1.  CBGs 143-171: Semglee 10 units daily.  Diabetic teaching. Maintain metformin to 500mg  BID- will not increase further given nausea with higher dose. Monitor creatinine. Discontinue ISS.  Recent Labs    11/08/20 1705 11/08/20 2106 11/09/20 0614  GLUCAP 132* 169* 150*    Mildly elevated on 9/18 11.  Obesity.  BMI 35.37-->34.89.  Dietary follow-up. Discontinue feeding supplement.  12. Sub-optimal potassium: start 10/18 daily supplement. Continue supplement.  13. Right arm spasticity: Decrease Baclofen to 5mg  QID given lethargy, will try outpatient Botox.   -tone improved with current dose, higher doses cause lethargy.   -continue splinting, ROM 14. Lethargy: resolved with decrease in baclofen- continue current dose which he is tolerating 15. Transaminitis: d/c tylenol and monitor LFTs weekly. 16. Impaired initiation: good response  to Ritalin- increase to 5mg  BID- will maintain this dose as do not want to increase seizure risk given concurrent baclofen 16. Disposition: d/c 10/1 (29 days total). F/u with me on 10/21 17.  Essential hypertension  Norvasc 2.5 started on 9/18  LOS: 16 days A FACE TO FACE EVALUATION WAS PERFORMED  Raymond Kerr 11/09/2020, 9:21 AM

## 2020-11-09 NOTE — Plan of Care (Signed)
  Problem: Consults Goal: RH STROKE PATIENT EDUCATION Description: See Patient Education module for education specifics  Outcome: Progressing   Problem: RH BOWEL ELIMINATION Goal: RH STG MANAGE BOWEL WITH ASSISTANCE Description: STG Manage Bowel with mod I Assistance. Outcome: Progressing Goal: RH STG MANAGE BOWEL W/MEDICATION W/ASSISTANCE Description: STG Manage Bowel with Medication with mod I Assistance. Outcome: Progressing   Problem: RH SKIN INTEGRITY Goal: RH STG SKIN FREE OF INFECTION/BREAKDOWN Description: With min assist Outcome: Progressing Goal: RH STG MAINTAIN SKIN INTEGRITY WITH ASSISTANCE Description: STG Maintain Skin Integrity With min Assistance. Outcome: Progressing Goal: RH STG ABLE TO PERFORM INCISION/WOUND CARE W/ASSISTANCE Description: STG Able To Perform Incision/Wound Care With min Assistance. Outcome: Progressing   Problem: RH SAFETY Goal: RH STG ADHERE TO SAFETY PRECAUTIONS W/ASSISTANCE/DEVICE Description: STG Adhere to Safety Precautions With cues Assistance/Device. Outcome: Progressing   Problem: RH KNOWLEDGE DEFICIT Goal: RH STG INCREASE KNOWLEDGE OF DIABETES Description: Patient will be able to manage DM with medications and dietary modifications using handouts and educational resources w cues/reminders Outcome: Progressing Goal: RH STG INCREASE KNOWLEDGE OF HYPERTENSION Description: Patient will be able to manage HTN with medications and dietary modifications using handouts and educational resources w cues/reminders  Outcome: Progressing Goal: RH STG INCREASE KNOWLEDGE OF DYSPHAGIA/FLUID INTAKE Description: Patient will be able to manage Dysphagia, medications management and dietary modifications using handouts and educational resources w cues Outcome: Progressing Goal: RH STG INCREASE KNOWLEGDE OF HYPERLIPIDEMIA Description: Patient will be able to manage HLD with medications and dietary modifications using handouts and educational resources  independently Outcome: Progressing Goal: RH STG INCREASE KNOWLEDGE OF STROKE PROPHYLAXIS Description: Patient will be able to manage secondary stroke risks with medications and dietary modifications using handouts and educational resources independently Outcome: Progressing   

## 2020-11-10 LAB — GLUCOSE, CAPILLARY
Glucose-Capillary: 137 mg/dL — ABNORMAL HIGH (ref 70–99)
Glucose-Capillary: 130 mg/dL — ABNORMAL HIGH (ref 70–99)
Glucose-Capillary: 173 mg/dL — ABNORMAL HIGH (ref 70–99)
Glucose-Capillary: 115 mg/dL — ABNORMAL HIGH (ref 70–99)
Glucose-Capillary: 143 mg/dL — ABNORMAL HIGH (ref 70–99)

## 2020-11-10 MED ORDER — AMLODIPINE BESYLATE 5 MG PO TABS
5.0000 mg | ORAL_TABLET | Freq: Every day | ORAL | Status: DC
  Administered 2020-11-11 – 2020-11-17 (×7): 5 mg via ORAL
  Filled 2020-11-10 (×7): qty 1

## 2020-11-10 MED ORDER — AMLODIPINE BESYLATE 2.5 MG PO TABS
2.5000 mg | ORAL_TABLET | Freq: Once | ORAL | Status: AC
  Administered 2020-11-10: 2.5 mg via ORAL
  Filled 2020-11-10: qty 1

## 2020-11-10 NOTE — Progress Notes (Addendum)
Occupational Therapy Weekly Progress Note  Patient Details  Name: Raymond Kerr MRN: [id number] Date of Birth: 1955-09-29  Beginning of progress report period: October 25, 2020 End of progress report period: November 10, 2020  Today's Date: 11/10/2020 OT Individual Time: 8127-5170 OT Individual Time Calculation (min): 38 min    Patient has met 1 of 5 short term goals.  Poor pt progress with barriers including poor alertness and arousal, poor processing, dense hemiplegia on right side, strong push posteriorly and to right when sitting and standing due to impaired midline orientation, significant right sided inattention, expressive aphasia (and question receptive aphasia possibly) which hinders pts ability to retain skilled training and to be able to participate during skilled sessions. He requires total assist +2 for all LB self care, functional mobility, and performance fluctuates for UB self care depending on pts arousal level but ranges from min assist to total assist. Pt requires further skilled OT to maximize gains, prevent joint contractures and reduced joint integrity, and to focus on caregiver education to prepare family members for pt discharge to home setting safely.    Patient continues to demonstrate the following deficits: muscle weakness, muscle joint tightness, and muscle paralysis, decreased cardiorespiratoy endurance, impaired timing and sequencing, abnormal tone, unbalanced muscle activation, motor apraxia, decreased coordination, and decreased motor planning, decreased visual acuity, decreased visual perceptual skills, and decreased visual motor skills, decreased midline orientation, decreased attention to right, right side neglect, decreased motor planning, and ideational apraxia, decreased initiation, decreased attention, decreased awareness, decreased problem solving, decreased safety awareness, decreased memory, and delayed processing, and decreased sitting balance, decreased  standing balance, decreased postural control, hemiplegia, and decreased balance strategies and therefore will continue to benefit from skilled OT intervention to enhance overall performance with BADL.  Patient not progressing toward long term goals.  See goal revision..  Plan of care revisions:  .  OT Short Term Goals Week 2:  OT Short Term Goal 1 (Week 2): Pt will demonstrate improved initiation with leaning forward and using left hand on bar of stedy to move to stand with max A of 1. OT Short Term Goal 1 - Progress (Week 2): Not progressing OT Short Term Goal 2 (Week 2): Pt will demonstrate improved sit balance to sit EOB with min A while bathing UB with min a. OT Short Term Goal 2 - Progress (Week 2): Progressing toward goal OT Short Term Goal 3 (Week 2): Pt will engage in RUE self ROM with mod A. OT Short Term Goal 3 - Progress (Week 2): Met OT Short Term Goal 4 (Week 2): Pt will don tshirt with mod A. OT Short Term Goal 4 - Progress (Week 2): Progressing toward goal OT Short Term Goal 5 (Week 2): Pt will demonstrate improved sit balance to sit EOB with min A while bathing UB with min a. Week 3:  OT Short Term Goal 1 (Week 3): Pt will don tshirt with mod A. OT Short Term Goal 2 (Week 3): Pt will demonstrate improved sit balance to sit EOB with min A while bathing UB with min a. OT Short Term Goal 3 (Week 3): Pt will scan plate and initiate self feeding using left hand with supervision. OT Short Term Goal 4 (Week 3): Pt will bathe UB with min assist.  Skilled Therapeutic Interventions/Progress Updates:    Pt semi tilted back in TIS w/c, asleep when OT arrived.  Pt required max multimodal cues to awaken.  Pt shaking head no when asked if he would  like to take a shower.  No response when asked if washing up at sink would be more desirable.  Pt transported to sink and offered warm washcloth in pts left visual field.  Pt initially did not scan over to verbal cues therefore OT provided visual cues  to help achieve increased attention to wash cloth.  Pt reached out with LUE partially then stopping and needing tactile cues to reach to target and grasp.  Max multimodal cues needed to initiate washing face but once pt started he was able to complete task without assist.  Pt provided with two simple choices (long sleeve shirt and short sleeve shirt) and question cue "which one?".  Pt needed significant time to process but then he did point to short sleeve shirt.  Pt then instructed to doff hospital scrub shirt.  Pt resisting when assistance offered and repetitively pulling shirt back down, therefore discontinued further self care.  Pt transported to rehab gym, however upon arriving pt had fallen asleep again.  Unable to arouse pt despite providing sternal rub, deep nailbed pressure, and max multimodal cues.  Returned pt to room via w/c and +2 dependent maximove transfer to bed completed. (Pt asleep through entire transfer).  Discontinued further skilled OT due to prevent safety hazards and secondary to poor pt arousal level.  Missed 37 minutes of treatment.  Therapy Documentation Precautions:  Precautions Precautions: Fall Precaution Comments: R hemi, global aphasia Restrictions Weight Bearing Restrictions: No    Therapy/Group: Individual Therapy  Ezekiel Slocumb 11/10/2020, 12:43 PM

## 2020-11-10 NOTE — Plan of Care (Signed)
  Problem: RH Ambulation Goal: LTG Patient will ambulate in controlled environment (PT) Description: LTG: Patient will ambulate in a controlled environment, # of feet with assistance (PT). Outcome: Not Applicable Note: DC goal - pt will not be a functional ambulator by discharge   Problem: RH Balance Goal: LTG Patient will maintain dynamic sitting balance (PT) Description: LTG:  Patient will maintain dynamic sitting balance with assistance during mobility activities (PT) Flowsheets (Taken 11/10/2020 1248) LTG: Pt will maintain dynamic sitting balance during mobility activities with:: Moderate Assistance - Patient 50 - 74% Note: Downgraded due to slow progress   Problem: Sit to Stand Goal: LTG:  Patient will perform sit to stand with assistance level (PT) Description: LTG:  Patient will perform sit to stand with assistance level (PT) Flowsheets (Taken 11/10/2020 1248) LTG: PT will perform sit to stand in preparation for functional mobility with assistance level: Maximal Assistance - Patient 25 - 49% Note: Downgraded due to slow progress   Problem: RH Bed Mobility Goal: LTG Patient will perform bed mobility with assist (PT) Description: LTG: Patient will perform bed mobility with assistance, with/without cues (PT). Flowsheets (Taken 11/10/2020 1248) LTG: Pt will perform bed mobility with assistance level of: Maximal Assistance - Patient 25 - 49% Note: Downgraded due to slow progress   Problem: RH Bed to Chair Transfers Goal: LTG Patient will perform bed/chair transfers w/assist (PT) Description: LTG: Patient will perform bed to chair transfers with assistance (PT). Flowsheets (Taken 11/10/2020 1248) LTG: Pt will perform Bed to Chair Transfers with assistance level: Maximal Assistance - Patient 25 - 49% Note: Downgraded due to slow progress

## 2020-11-10 NOTE — Progress Notes (Signed)
Speech Language Pathology Daily Session Note  Patient Details  Name: Damondre Pfeifle MRN: [id number] Date of Birth: Aug 12, 1955  Today's Date: 11/10/2020 SLP Individual Time: 0805-0900 SLP Individual Time Calculation (min): 55 min  Short Term Goals: Week 3: SLP Short Term Goal 1 (Week 3): Patient will demonstrate efficient mastication and oral clearance of dysphagia 3 solids with modA verbal, visual and tactile cues. SLP Short Term Goal 2 (Week 3): Patient will point to identify object pictures/photos in field of 4 when named or basic function/category described with 80% accuracy and modA tactile and verbal cues to initiate pointing. SLP Short Term Goal 3 (Week 3): Patient will name basic level objects/object pictures/photos at word level with modA phrase completion cues, initial phoneme cues. SLP Short Term Goal 4 (Week 3): Patient will initiate to perform gestures (pointing) and for performing basic functional tasks (toothbrushing, self-feeding) with min-modA verbal and tactile cues. SLP Short Term Goal 5 (Week 3): Patient will respond with yes/no verbal, gestural or yes/no communication board to basic/immediate needs questions with minA to initiate reponse.  Skilled Therapeutic Interventions: Skilled ST services focused on swallow and language skills. NT was providing total A for feeding breakfast tray, dys 3 textures and thin liquids via straw, handing off to SLP. Pt required total A for feeding reducing to max A with cup/fork placed in pt's hand and requiring hand over hand assist to bring item to mouth, pt was able to do this x3 and then required total A again suggest impacted by reduced attention. Pt was unable to respond to yes/no question via head nods during meal pertaining to wants/needs, however at the end of the meal pt nodded "no" to 7 items and then accepts applesauce and thin drink with no indication of gestures "yes" despite SLP's attempt to have pt return demonstration. Pt completed  oral care, following 1 step directions with suction toothbrush requiring initial hand over hand assistance to bring brush to oral cavity, however was able to brush bottom teeth with min A verbal cues and top teeth with mod A verbal cues. Pt was unable to imitate automatic language task, counting 1-3 or singing "happy birthday", however pt did demonstrate increase in vocalization attempts as the session continued, with low vocal intensity, CV and C sounds that were unintelligible. Pt was unable to name common objects with max A verbal cues, but was able to name (previous named by SLP objects) by pointing to yes/no on communication board with initial hand over hand cues to start task in 3 out 5 trials. Pt began to fall asleep eyes closing at the end of the session. Pt was left with call bell within reach and bed alarm set. Recommend to continued skilled ST services.     Pain Pain Assessment Pain Scale: 0-10 Pain Score: 0-No pain Faces Pain Scale: No hurt  Therapy/Group: Individual Therapy  Jaykob Minichiello  [facility] 11/10/2020, 12:15 PM

## 2020-11-10 NOTE — Plan of Care (Signed)
Downgraded the following goals due to limited progress made since initial evaluation:  Problem: RH Balance Goal: LTG: Patient will maintain dynamic sitting balance (OT) Description: LTG:  Patient will maintain dynamic sitting balance with assistance during activities of daily living (OT) Flowsheets (Taken 11/10/2020 1254) LTG: Pt will maintain dynamic sitting balance during ADLs with: Moderate Assistance - Patient 50 - 74% Goal: LTG Patient will maintain dynamic standing with ADLs (OT) Description: LTG:  Patient will maintain dynamic standing balance with assist during activities of daily living (OT)  Flowsheets (Taken 11/10/2020 1254) LTG: Pt will maintain dynamic standing balance during ADLs with: Maximal Assistance - Patient 25 - 49%   Problem: Sit to Stand Goal: LTG:  Patient will perform sit to stand in prep for activites of daily living with assistance level (OT) Description: LTG:  Patient will perform sit to stand in prep for activites of daily living with assistance level (OT) Flowsheets (Taken 11/10/2020 1254) LTG: PT will perform sit to stand in prep for activites of daily living with assistance level: Maximal Assistance - Patient 25 - 49%   Problem: RH Grooming Goal: LTG Patient will perform grooming w/assist,cues/equip (OT) Description: LTG: Patient will perform grooming with assist, with/without cues using equipment (OT) Flowsheets (Taken 11/10/2020 1254) LTG: Pt will perform grooming with assistance level of: Minimal Assistance - Patient > 75%   Problem: RH Bathing Goal: LTG Patient will bathe all body parts with assist levels (OT) Description: LTG: Patient will bathe all body parts with assist levels (OT) Flowsheets (Taken 11/10/2020 1254) LTG: Pt will perform bathing with assistance level/cueing: Moderate Assistance - Patient 50 - 74% LTG: Position pt will perform bathing: At sink   Problem: RH Dressing Goal: LTG Patient will perform upper body dressing (OT) Description:  LTG Patient will perform upper body dressing with assist, with/without cues (OT). Flowsheets (Taken 11/10/2020 1254) LTG: Pt will perform upper body dressing with assistance level of: Moderate Assistance - Patient 50 - 74% Goal: LTG Patient will perform lower body dressing w/assist (OT) Description: LTG: Patient will perform lower body dressing with assist, with/without cues in positioning using equipment (OT) Flowsheets (Taken 11/10/2020 1254) LTG: Pt will perform lower body dressing with assistance level of: Maximal Assistance - Patient 25 - 49%   Problem: RH Toileting Goal: LTG Patient will perform toileting task (3/3 steps) with assistance level (OT) Description: LTG: Patient will perform toileting task (3/3 steps) with assistance level (OT)  Flowsheets (Taken 11/10/2020 1254) LTG: Pt will perform toileting task (3/3 steps) with assistance level: Maximal Assistance - Patient 25 - 49%   Problem: RH Toilet Transfers Goal: LTG Patient will perform toilet transfers w/assist (OT) Description: LTG: Patient will perform toilet transfers with assist, with/without cues using equipment (OT) Flowsheets (Taken 11/10/2020 1254) LTG: Pt will perform toilet transfers with assistance level of: Maximal Assistance - Patient 25 - 49%   Problem: RH Tub/Shower Transfers Goal: LTG Patient will perform tub/shower transfers w/assist (OT) Description: LTG: Patient will perform tub/shower transfers with assist, with/without cues using equipment (OT) Flowsheets (Taken 11/10/2020 1254) LTG: Pt will perform tub/shower stall transfers with assistance level of: Maximal Assistance - Patient 25 - 49%

## 2020-11-10 NOTE — Progress Notes (Signed)
Physical Therapy Weekly Progress Note  Patient Details  Name: Raymond Kerr MRN: [id number] Date of Birth: 1955/09/07  Beginning of progress report period: November 03, 2020 End of progress report period: November 10, 2020  Today's Date: 11/10/2020 PT Individual Time: 7048-8891 PT Individual Time Calculation (min): 55 min   Patient has met 0 of 4 short term goals. Pt's progress has been limited - he continues to require +2 totalA for bed mobility, +2 maxA for lateral scoot vs squat<>pivot transfers, and +2 modA for sit<>stand transfers via 3-muskateers technique. He shows strong pushing tendencies towards his paretic R side, is mostly nonverbal with inconsistent responses to yes/no questions, has delayed initiation and processing impacting functional mobility tasks, and dense R hemiplegia. Lethargy and fatigue has also impacted his ability to actively participate in therapy sessions. Will begin to initiate family training to prepare for anticipated discharge home in 2 weeks.   Patient continues to demonstrate the following deficits muscle weakness and muscle joint tightness, decreased cardiorespiratoy endurance, impaired timing and sequencing, abnormal tone, unbalanced muscle activation, motor apraxia, decreased coordination, and decreased motor planning, decreased visual acuity, decreased visual perceptual skills, and decreased visual motor skills, decreased attention to right, right side neglect, decreased motor planning, and ideational apraxia, decreased initiation, decreased attention, decreased awareness, decreased problem solving, decreased safety awareness, decreased memory, and delayed processing, and decreased sitting balance, decreased standing balance, decreased postural control, hemiplegia, and decreased balance strategies and therefore will continue to benefit from skilled PT intervention to increase functional independence with mobility.  Patient progressing toward long term goals..   Continue plan of care.  PT Short Term Goals Week 3:  PT Short Term Goal 1 (Week 3): Pt will perform bed mobility with max assist of 1. PT Short Term Goal 2 (Week 3): Pt will complete bed<>chair transfers with maxA of 1 person PT Short Term Goal 3 (Week 3): Pt will maintain static standing for 2 minutes or greater with maxA of 1 person PT Short Term Goal 4 (Week 3): Pt will participate in family training to prepare for discharge  Skilled Therapeutic Interventions/Progress Updates:     Pt supine in bed at start of session - awake and appears agreeable to therapy without reports or signs of pain. Noted brief and bed linen to be saturated of urine. Assisted pt with brief change and pericare at bed level, requiring totalA +2 for rolling in bed and pericare. Instructed pt to perform frontal pericare and despite hand-over-hand assist, pt did not demonstrate initiation or effort and would raise his arm away. Donned disposable pants with +2 totalA in bed. Supine<>sit with HOB flat required +2 totalA for trunk and BLE management. At EOB, initially requires modA for sitting balance due to posterior lean but with multimodal cueing, this corrects to CGA. Completed lateral scoot/squat<>pivot transfer with +2 maxA from EOB to w/c, towards paretic R side - completed in x2 scoots. Pt then wheeled to main rehab gym for time. Completed additional lateral scoot transfer with +2 maxA to mat table with assist primarily for facilitating forward weight shift, R foot placement, and lifting. At edge of mat, pt demo's R lean - provided mirror for visual feedback but minimal improvement. Positioned patient in neutral but he would continue to lean R. Instructed on targeted reaching task with bean bags to work on sustained L lean and forward weight shift - pt needing max verbal cues and hand-over-hand facilitation with his stronger LUE. Completed x1 sit<>stand with +2 maxA via 3-muskateers technique -  in standing, required +2 modA with  R knee block and facilitation for upright and posterior chain activation. Pt noted to be very lethargic during session, frequently falling asleep. BP assessed in seated, reading 121/85. MD present during morning rounds and relayed lethargy to him. Pt assisted back to his w/c via lateral scoot +2 maxA in similar techniques as above. Returned to his room where he remained seated in w/c, reclined for safety/pressure relief, call bell in reach.    Therapy Documentation Precautions:  Precautions Precautions: Fall Precaution Comments: R hemi, global aphasia Restrictions Weight Bearing Restrictions: No General:    Therapy/Group: Individual Therapy  Alger Simons 11/10/2020, 7:33 AM

## 2020-11-10 NOTE — Progress Notes (Signed)
PROGRESS NOTE   Subjective/Complaints: Pt up in gym with therapy. PT reports that he's a lot more somnolent than he was last week.   ROS: Limited due to cognitive/behavioral    Objective:   No results found. No results for input(s): WBC, HGB, HCT, PLT in the last 72 hours.   No results for input(s): NA, K, CL, CO2, GLUCOSE, BUN, CREATININE, CALCIUM in the last 72 hours.    Intake/Output Summary (Last 24 hours) at 11/10/2020 1134 Last data filed at 11/09/2020 1819 Gross per 24 hour  Intake 120 ml  Output --  Net 120 ml        Physical Exam: Vital Signs Blood pressure (!) 145/109, pulse 98, temperature 98.1 F (36.7 C), temperature source Oral, resp. rate 18, height 6' (1.829 m), weight 109.9 kg, SpO2 100 %. Constitutional: No distress . Vital signs reviewed. HEENT: NCAT, EOMI, oral membranes moist Neck: supple Cardiovascular: RRR without murmur. No JVD    Respiratory/Chest: CTA Bilaterally without wheezes or rales. Normal effort    GI/Abdomen: BS +, non-tender, non-distended Ext: no clubbing, cyanosis, or edema Psych: flat, lethargic Skin: warm, intact Musc: No edema in extremities.  No tenderness in extremities. Neuro: Alert, right central 7. Has a hard time keeping eyes open. Leans to right.  Global aphasia Does not make eye contact, does not engage or follow commands No resting tone  Assessment/Plan: 1. Functional deficits which require 3+ hours per day of interdisciplinary therapy in a comprehensive inpatient rehab setting. Physiatrist is providing close team supervision and 24 hour management of active medical problems listed below. Physiatrist and rehab team continue to assess barriers to discharge/monitor patient progress toward functional and medical goals  Care Tool:  Bathing    Body parts bathed by patient: Face, Right arm, Chest, Abdomen, Front perineal area   Body parts bathed by helper:  Buttocks, Right upper leg, Left upper leg, Left lower leg, Right lower leg, Left arm     Bathing assist Assist Level: Maximal Assistance - Patient 24 - 49%     Upper Body Dressing/Undressing Upper body dressing   What is the patient wearing?: Pull over shirt    Upper body assist Assist Level: Moderate Assistance - Patient 50 - 74%    Lower Body Dressing/Undressing Lower body dressing      What is the patient wearing?: Pants, Incontinence brief     Lower body assist Assist for lower body dressing: Total Assistance - Patient < 25% (bed level)     Toileting Toileting    Toileting assist Assist for toileting: 2 Helpers     Transfers Chair/bed transfer  Transfers assist  Chair/bed transfer activity did not occur: Safety/medical concerns  Chair/bed transfer assist level: 2 Helpers     Locomotion Ambulation   Ambulation assist   Ambulation activity did not occur: Safety/medical concerns          Walk 10 feet activity   Assist  Walk 10 feet activity did not occur: Safety/medical concerns        Walk 50 feet activity   Assist Walk 50 feet with 2 turns activity did not occur: Safety/medical concerns  Walk 150 feet activity   Assist Walk 150 feet activity did not occur: Safety/medical concerns         Walk 10 feet on uneven surface  activity   Assist Walk 10 feet on uneven surfaces activity did not occur: Safety/medical concerns         Wheelchair     Assist Is the patient using a wheelchair?:  (yes, per PT note) Type of Wheelchair:  (manual per PT note) Wheelchair activity did not occur: Safety/medical concerns         Wheelchair 50 feet with 2 turns activity    Assist    Wheelchair 50 feet with 2 turns activity did not occur: Safety/medical concerns       Wheelchair 150 feet activity     Assist  Wheelchair 150 feet activity did not occur: Safety/medical concerns       Blood pressure (!) 145/109,  pulse 98, temperature 98.1 F (36.7 C), temperature source Oral, resp. rate 18, height 6' (1.829 m), weight 109.9 kg, SpO2 100 %.    Medical Problem List and Plan: 1.  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  Impaired mobility -DVT/anticoagulation:  Pharmaceutical: Continue Lovenox             -antiplatelet therapy: Aspirin 81 mg daily and Plavix 75 mg daily x90 days then aspirin alone 3. Pain Management: Tylenol as needed 4. Mood: Provide emotional support             -antipsychotic agents: N/A 5. Neuropsych: This patient is not capable of making decisions on his own behalf. 6. Skin/Wound Care: Routine skin checks 7. Fluids/Electrolytes/Nutrition: Routine in and outs 8.  Post stroke dysphagia.  Mechanical soft thin liquids..  Patient limited p.o. intake.   Continue Megace to stimulate appetite follow-up speech therapy Remeraon added on 9/18--ate 40% yeterday 9.  Hyperlipidemia. Continue Lipitor. LDL reviewed- 124.  10. Uncontrolled Diabetes mellitus with hyperglycemia.  Hemoglobin A1c 10.1.  CBGs 143-171: Semglee 10 units daily.  Diabetic teaching. Maintain metformin to 500mg  BID- will not increase further given nausea with higher dose. Monitor creatinine. Discontinue ISS.  Recent Labs    11/09/20 1635 11/09/20 2044 11/10/20 0625  GLUCAP 162* 172* 130*   Mildly elevated on 9/19--no changes until he's eating more consistently 11.  Obesity.  BMI 35.37-->34.89.  Dietary follow-up. Discontinue feeding supplement.  12. Sub-optimal potassium: start 04-21-1999 daily supplement. Continue supplement.  13. Right arm spasticity: Decrease Baclofen to 5mg  QID given lethargy, will try outpatient Botox.   -tone improved with current dose, higher doses cause lethargy.   -continue splinting, ROM  -9/19 dc baclofen given ongoing lethargy. May  have to tolerate some tone although he had 0 tone today 14. Lethargy: see above 15. Transaminitis: d/c tylenol and monitor LFTs weekly. 16. Impaired initiation: good response to Ritalin-continue 5mg  BID   -dc baclofen 16. Disposition: d/c 10/1 (29 days total). F/u with me on 10/21 17.  Essential hypertension  Norvasc 2.5 started on 9/18---increase to 5mg  9/19  LOS: 17 days A FACE TO FACE EVALUATION WAS PERFORMED  11/10/2020, 11:34 AM

## 2020-11-10 NOTE — Plan of Care (Signed)
  Problem: RH Comprehension Communication Goal: LTG Patient will comprehend basic/complex auditory (SLP) Description: LTG: Patient will comprehend basic/complex auditory information with cues (SLP). Flowsheets (Taken 11/10/2020 1221) LTG: Patient will comprehend auditory information with cueing (SLP): (downgraded due to slow progress) Moderate Assistance - Patient 50 - 74% Note: Downgraded due to slow progress   Problem: RH Expression Communication Goal: LTG Patient will express needs/wants via multi-modal(SLP) Description: LTG:  Patient will express needs/wants via multi-modal communication (gestures/written, etc) with cues (SLP) Flowsheets (Taken 11/10/2020 1221) LTG: Patient will express needs/wants via multimodal communication (gestures/written, etc) with cueing (SLP): (downgraded due to slow progress) Moderate Assistance - Patient 50 - 74% Note: Downgraded due to slow progress   Problem: RH Attention Goal: LTG Patient will demonstrate this level of attention during functional activites (SLP) Description: LTG:  Patient will will demonstrate this level of attention during functional activites (SLP) Flowsheets (Taken 11/10/2020 1221) Patient will demonstrate during cognitive/linguistic activities the attention type of:  Sustained  Focused LTG: Patient will demonstrate this level of attention during cognitive/linguistic activities with assistance of (SLP): (downgraded due to slow progress) Moderate Assistance - Patient 50 - 74% Note: Downgraded due to slow progress   Problem: RH Expression Communication Goal: LTG Patient will increase word finding of common (SLP) Description: LTG:  Patient will increase word finding of common objects/daily info/abstract thoughts with cues using compensatory strategies (SLP). Flowsheets (Taken 11/10/2020 1221) LTG: Patient will increase word finding of common (SLP): (donwgraded due to slow progress) Moderate Assistance - Patient 50 - 74% Note: Downgraded due  to slow progress

## 2020-11-11 LAB — GLUCOSE, CAPILLARY
Glucose-Capillary: 115 mg/dL — ABNORMAL HIGH (ref 70–99)
Glucose-Capillary: 119 mg/dL — ABNORMAL HIGH (ref 70–99)
Glucose-Capillary: 124 mg/dL — ABNORMAL HIGH (ref 70–99)
Glucose-Capillary: 149 mg/dL — ABNORMAL HIGH (ref 70–99)

## 2020-11-11 MED ORDER — B COMPLEX-C PO TABS
1.0000 | ORAL_TABLET | Freq: Every day | ORAL | Status: DC
  Administered 2020-11-11 – 2020-11-25 (×15): 1 via ORAL
  Filled 2020-11-11 (×16): qty 1

## 2020-11-11 MED ORDER — METHYLPHENIDATE HCL 5 MG PO TABS
10.0000 mg | ORAL_TABLET | Freq: Two times a day (BID) | ORAL | Status: DC
  Administered 2020-11-11 – 2020-11-25 (×29): 10 mg via ORAL
  Filled 2020-11-11 (×30): qty 2

## 2020-11-11 NOTE — Progress Notes (Signed)
Physical Therapy Session Note  Patient Details  Name: Raymond Kerr MRN: [id number] Date of Birth: 06-15-1955  Today's Date: 11/11/2020 PT Individual Time: 1130-1156 + 1430-1530 PT Individual Time Calculation (min): 26 min  + 60 min  Short Term Goals: Week 3:  PT Short Term Goal 1 (Week 3): Pt will perform bed mobility with max assist of 1. PT Short Term Goal 2 (Week 3): Pt will complete bed<>chair transfers with maxA of 1 person PT Short Term Goal 3 (Week 3): Pt will maintain static standing for 2 minutes or greater with maxA of 1 person PT Short Term Goal 4 (Week 3): Pt will participate in family training to prepare for discharge  Skilled Therapeutic Interventions/Progress Updates:     1st session: Notified by NT that patient will be transferring rooms - requested pt back into the bed at end of session. Upon entrance into room, pt in TIS w/c - awake and agreeable to therapy. Does not appear to be in any pain but aphasia limiting. Treatment to tolerance. He does seem to be more alert and awake compared to the past few days. Completed squat<>pivot transfer with +2 totalA from his TIS w/c to EOB with assistance from NT. Cues throughout transfer for setup, forward weight shift, R foot placement, and technique. Required x2 scoots to achieve squat<>pivot transfer. Required +2 totalA for sit>supine with HOB flat. While supine in bed, performed PROM to R hip/knee/ankle with verbal/tactile cues for initiation but none noted in R hemibody. Grimacing with end range of R hip flexion and provided hip abductor/adductor stretches as well. Worked on rolling in bed, to the R side, with cues for technique for reaching across body to bed rail and flexing R knee to assist in pushing. He initially required minA for rolling but after 3 reps, required modA. Pt remained supine in bed at completion of session with bed alarm on and all needs within reach.  2nd session: Pt supine in bed at start of session - awake and  appears agreeable to therapy treatment. Completed supine to sit with total +2 with HOB slightly raised. At EOB, he required modA for sitting balance due to posterior bias and inability to correct. Completed slide board transfer with +2 totalA to TIS w/c, completed in x4 scoots. Transported to day room rehab gym and placed in front of standing frame with BITS system in front of frame. Completed sit<>stand at dependant level in standing frame and required min/modA for standing balance in frame due to R pushing. Worked on sustained and lateral reaching towards his L side with stronger LUE to promote L weight shift. He demonstrates improved initiation but does require facilitation for reaching tasks. He was also instructed on sequencing #1-#10 in similar reaching manner and demonstrated 20% accuracy is selection with this task. Pt returned to his room at end of session, remained reclined in TIS w/c with all needs in reach, safety belt alarm on, NT notified of pt status.   Therapy Documentation Precautions:  Precautions Precautions: Fall Precaution Comments: R hemi, global aphasia Restrictions Weight Bearing Restrictions: No General:    Therapy/Group: Individual Therapy  Raymond Kerr 11/11/2020, 3:29 PM

## 2020-11-11 NOTE — Progress Notes (Addendum)
Occupational Therapy Session Note  Patient Details  Name: Raymond Kerr MRN: [id number] Date of Birth: [birth year]  Today's Date: 11/11/2020 OT Individual Time: 4098-1191 OT Individual Time Calculation (min): 55 min    Short Term Goals: Week 3:  OT Short Term Goal 1 (Week 3): Pt will don tshirt with mod A. OT Short Term Goal 2 (Week 3): Pt will demonstrate improved sit balance to sit EOB with min A while bathing UB with min a. OT Short Term Goal 3 (Week 3): Pt will scan plate and initiate self feeding using left hand with supervision. OT Short Term Goal 4 (Week 3): Pt will bathe UB with min assist.  Skilled Therapeutic Interventions/Progress Updates:    Pt asleep in supine, needing max multimodal cues including sternal rub, cold wash cloth, auditory cues, and elevated HOB upright to increase pt arousal/alertness.  Total assist to wash BLE in supine.  Pt did follow simple commands to attempt pulling pants over hips in bridging position using LUE.  Required total assist overall to donn pants at bed level.  Left sidelying with total assist and sidelying to sit with +2 total assist.  Pt initially requiring mod assist to maintain upright sitting however able to fade to CGA with only mild right sided leaning while completing UB self care. Pt required step by step multimodal cues to progress through sequence of dressing and bathing.  Max assist needed to [facility] hospital gown.  Pt able to wash face with setup but perseverating on task needing tactile cue to wash chest and right arm.  Hand over hand provided to right in order to wash LUE and facilitate neuro re-ed of affected extremity.  Educated pt on hemi technique to donn shirt and provided visual cues.  Pt did participate and attempted to pull fingers through sleeve once mostly threaded.  Overall needed max assist to fully donn shirt overhead.  Sliding board transfer EOB to TIS w/c with total assist +2 needing manual blocking of LUE to prevent pushing and a  lot of manual facilitation to promote anterior trunk lean and weight shifting as needed.  Call bell in reach, RUE supported with pillows, seat alarm on.  Improved alertness and participation noted today and also improved static sitting balance noted today.    Therapy Documentation Precautions:  Precautions Precautions: Fall Precaution Comments: R hemi, global aphasia Restrictions Weight Bearing Restrictions: No    Therapy/Group: Individual Therapy  Amie Critchley 11/11/2020, 9:56 AM

## 2020-11-11 NOTE — Progress Notes (Signed)
Speech Language Pathology Daily Session Note  Patient Details  Name: Raymond Kerr MRN: [id number] Date of Birth: Sep 18, 1955  Today's Date: 11/11/2020 SLP Individual Time: 9024-0973 SLP Individual Time Calculation (min): 43 min  Short Term Goals: Week 3: SLP Short Term Goal 1 (Week 3): Patient will demonstrate efficient mastication and oral clearance of dysphagia 3 solids with modA verbal, visual and tactile cues. SLP Short Term Goal 2 (Week 3): Patient will point to identify object pictures/photos in field of 4 when named or basic function/category described with 80% accuracy and modA tactile and verbal cues to initiate pointing. SLP Short Term Goal 3 (Week 3): Patient will name basic level objects/object pictures/photos at word level with modA phrase completion cues, initial phoneme cues. SLP Short Term Goal 4 (Week 3): Patient will initiate to perform gestures (pointing) and for performing basic functional tasks (toothbrushing, self-feeding) with min-modA verbal and tactile cues. SLP Short Term Goal 5 (Week 3): Patient will respond with yes/no verbal, gestural or yes/no communication board to basic/immediate needs questions with minA to initiate reponse.  Skilled Therapeutic Interventions:Skilled ST services focused on language skills. NT reported pt spitting food dys 3 textures during breakfast. Pt was only able to shake head "no" despite hand over hand assistance for use of yes/no communication board, in response to wants/needs for food and drinks. Pt turned head away when presented with food but drank water via straw at a fast rate when presented with straw despite gestural "no." Pt was able to identify with general hand tap "drink" black/white picture on communication board in a field of 2 with initial hand over hand assistance, pt was able to return demonstrated x1 out 2 attempts. Pt demonstrated slight increase in verbalization task with ability to repeat at word level name of common objects  2 out 5 opportunities and ability to produce at least the initial speech sound in 4 out 5 opportunities (words beginning with /b/, /p/ and /d/.) Pt was able to brush teeth with suction toothbrush with min A verbal cues increasing to max A due to fading due to reduce alertness. Pt demonstrated focused/sustained attention in a maximum of 3 minutes. Pt verbalized "no" with low vocal intensity, when SLP attempted to place pillow below pt's head prior to leaving room. Pt was left in room with call bell within reach and bed alarm set. SLP recommends to continue skilled services.     Pain Pain Assessment Pain Score: 0-No pain  Therapy/Group: Individual Therapy  Annaya Bangert  [facility] 11/11/2020, 11:56 AM

## 2020-11-11 NOTE — Progress Notes (Signed)
Orthopedic Tech Progress Note Patient Details:  Raymond Kerr 09/19/1955 [id number]  Called in order to [employer] for a HAND SPLINT   Patient ID: Raymond Kerr, male   DOB: 26-Oct-1955, 65 y.o.   MRN: [id number]  Raymond Kerr 11/11/2020, 1:26 PM

## 2020-11-11 NOTE — Patient Care Conference (Signed)
Inpatient RehabilitationTeam Conference and Plan of Care Update Date: 11/11/2020   Time: 13:10 PM    Patient Name: Raymond Kerr      Medical Record Number: [id number]  Date of Birth: Apr 24, 1955 Sex: Male         Room/Bed: 4M10C/4M10C-01 Payor Info: Payor: /    Admit Date/Time:  10/24/2020  4:41 PM  Primary Diagnosis:  Left thalamic infarction [facility])  Hospital Problems: Principal Problem:   Left thalamic infarction [facility]) Active Problems:   Essential hypertension   Spastic hemiparesis (HCC)   Uncontrolled type 2 diabetes mellitus with hyperglycemia [facility])    Expected Discharge Date: Expected Discharge Date: 11/22/20  Team Members Present: Physician leading conference: Dr. Sula Soda Social Worker Present: Dossie Der, LCSW Nurse Present: Chana Bode, RN PT Present: Wynelle Link, PT OT Present: Dolphus Jenny, OT SLP Present: Colin Benton, SLP PPS Coordinator present : Fae Pippin, SLP     Current Status/Progress Goal Weekly Team Focus  Bowel/Bladder             Swallow/Nutrition/ Hydration   dys 3 textures and thin liquids, swallowing mod-min A pocketing, total A feeding  Min A  swallow strategies and encouraging self feeding   ADL's   UB self care fluctuates CGA-dependent depending on arousal level, +2 total assist LB self care and functional transfers with posterior and right pushing  downgraded to mod- max assist due to slow progress  self care training, NMR, functional transfers, self PROM education   Mobility   +2 totalA bed mobility, minA static sitting balance (can be CGA at times). +2 maxA squat<>pivot transfers, +2 mod/maxA sit<>stand transfers.  Downgraded to maxA  activity tolerance, improving alertness and active participation, functional transfers, sitting balance, sit<>stand transfers, standing in standing frame and via +2 3-muskateers technique. Begining family education   Communication   max A, occasional attempts to vocalize/naming with  sentence completion and yes/no response head nod/communication board  Mod A - downgraded 9/19  yes/no, vocalizations/naming, communication board and writting   Safety/Cognition/ Behavioral Observations  Max A, slight increase in attention compared to last week  mod A - downgraded 9/19  focused and sustained attention   Pain             Skin               Discharge Planning:  Will need to try to get daughter's in for therapies due to pt's care needs. Pt is uninsured and does not have an option to go to SNF   Team Discussion: Patient is lethargic and refusing meals and meds. On appetite stim; patient notes he doesn't like the taste of the food. MD to adjust ritalin dosage. Check UA, C+S. Patient on target to meet rehab goals: Currently requires total assist for care and hand over hand assistance for activities. Able to sit with min - mod assist. Progress limited by activity intolerance and alertness. Hoyer for transfers. Anticipate will be bed level for discharge  *See Care Plan and progress notes for long and short-term goals.   Revisions to Treatment Plan:  Downgraded goals for discharge Resting hand splint requested   Teaching Needs: Medications, nutritional means, secondary risk management, safety, transfers, toileting, etc  Current Barriers to Discharge: Home enviroment access/layout, Incontinence, Lack of/limited family support, Weight, and Nutritional means and lack of insurance for SNF or HH follow up care  Possible Resolutions to Barriers: Family education with daughter and other family members Equipment recommended for home care  Medical Summary Current Status: Lethargy, decreased appetite, impaired ability to follow commands/impaired initation, spasticity has improved  Barriers to Discharge: Behavior;Neurogenic Bowel & Bladder;Weight  Barriers to Discharge Comments: Lethargy, decreased appetite, impaired ability to follow commands/impaired initation, overweight BMI  32 Possible Resolutions to [employer]: continue Ritalin/will increase to 10mg  BID, check UA/UC, continue Megace, d/c Ensure to promote eating of regular meals   Continued Need for Acute Rehabilitation Level of Care: The patient requires daily medical management by a physician with specialized training in physical medicine and rehabilitation for the following reasons: Direction of a multidisciplinary physical rehabilitation program to maximize functional independence : Yes Medical management of patient stability for increased activity during participation in an intensive rehabilitation regime.: Yes Analysis of laboratory values and/or radiology reports with any subsequent need for medication adjustment and/or medical intervention. : Yes   I attest that I was present, lead the team conference, and concur with the assessment and plan of the team.   B 11/11/2020, 5:01 PM

## 2020-11-11 NOTE — Progress Notes (Addendum)
PROGRESS NOTE   Subjective/Complaints: Continues to me more somnolent this morning despite off of Baclofen. Tone is much improved- no need to restart Baclofen at this time. B complex with Vitamin C started to promote stroke recovery  ROS: Limited due to cognitive/behavioral    Objective:   No results found. No results for input(s): WBC, HGB, HCT, PLT in the last 72 hours.   No results for input(s): NA, K, CL, CO2, GLUCOSE, BUN, CREATININE, CALCIUM in the last 72 hours.    Intake/Output Summary (Last 24 hours) at 11/11/2020 0957 Last data filed at 11/11/2020 8264 Gross per 24 hour  Intake 160 ml  Output --  Net 160 ml        Physical Exam: Vital Signs Blood pressure 140/81, pulse 89, temperature 99.2 F (37.3 C), temperature source Oral, resp. rate 17, height 6' (1.829 m), weight 109.9 kg, SpO2 99 %. Constitutional: No distress . Vital signs reviewed. HEENT: NCAT, EOMI, oral membranes moist Neck: supple Cardiovascular: RRR without murmur. No JVD    Respiratory/Chest: CTA Bilaterally without wheezes or rales. Normal effort    GI/Abdomen: BS +, non-tender, non-distended Ext: no clubbing, cyanosis, or edema Psych: flat, lethargic Skin: warm, intact Musc: No edema in extremities.  No tenderness in extremities. Neuro: Alert, right central 7. Has a hard time keeping eyes open. Leans to right.  Global aphasia Makes eye contact, engages temporarily, not following commands No resting tone  Assessment/Plan: 1. Functional deficits which require 3+ hours per day of interdisciplinary therapy in a comprehensive inpatient rehab setting. Physiatrist is providing close team supervision and 24 hour management of active medical problems listed below. Physiatrist and rehab team continue to assess barriers to discharge/monitor patient progress toward functional and medical goals  Care Tool:  Bathing    Body parts bathed by  patient: Face, Right arm, Chest, Abdomen, Front perineal area   Body parts bathed by helper: Buttocks, Right upper leg, Left upper leg, Left lower leg, Right lower leg, Left arm     Bathing assist Assist Level: Maximal Assistance - Patient 24 - 49%     Upper Body Dressing/Undressing Upper body dressing   What is the patient wearing?: Pull over shirt    Upper body assist Assist Level: Moderate Assistance - Patient 50 - 74%    Lower Body Dressing/Undressing Lower body dressing      What is the patient wearing?: Pants, Incontinence brief     Lower body assist Assist for lower body dressing: Total Assistance - Patient < 25% (bed level)     Toileting Toileting    Toileting assist Assist for toileting: 2 Helpers     Transfers Chair/bed transfer  Transfers assist  Chair/bed transfer activity did not occur: Safety/medical concerns  Chair/bed transfer assist level: 2 Helpers     Locomotion Ambulation   Ambulation assist   Ambulation activity did not occur: Safety/medical concerns          Walk 10 feet activity   Assist  Walk 10 feet activity did not occur: Safety/medical concerns        Walk 50 feet activity   Assist Walk 50 feet with 2 turns activity did not  occur: Safety/medical concerns         Walk 150 feet activity   Assist Walk 150 feet activity did not occur: Safety/medical concerns         Walk 10 feet on uneven surface  activity   Assist Walk 10 feet on uneven surfaces activity did not occur: Safety/medical concerns         Wheelchair     Assist Is the patient using a wheelchair?:  (yes, per PT note) Type of Wheelchair:  (manual per PT note) Wheelchair activity did not occur: Safety/medical concerns         Wheelchair 50 feet with 2 turns activity    Assist    Wheelchair 50 feet with 2 turns activity did not occur: Safety/medical concerns       Wheelchair 150 feet activity     Assist  Wheelchair 150  feet activity did not occur: Safety/medical concerns       Blood pressure 140/81, pulse 89, temperature 99.2 F (37.3 C), temperature source Oral, resp. rate 17, height 6' (1.829 m), weight 109.9 kg, SpO2 99 %.    Medical Problem List and Plan: 1.  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 2.  Impaired mobility -DVT/anticoagulation:  Pharmaceutical: Continue Lovenox             -antiplatelet therapy: Aspirin 81 mg daily and Plavix 75 mg daily x90 days then aspirin alone 3. Pain Management: Tylenol as needed 4. Mood: Provide emotional support             -antipsychotic agents: N/A 5. Neuropsych: This patient is not capable of making decisions on his own behalf. 6. Skin/Wound Care: Routine skin checks 7. Fluids/Electrolytes/Nutrition: Routine in and outs 8.  Post stroke dysphagia.  Mechanical soft thin liquids..  Patient limited p.o. intake.   Continue Megace to stimulate appetite follow-up speech therapy D/c remeron given worsening morning lethargy. Will try to determine what foods he likes- he does have an appetite but does not like the foods we are offering/D3 diet.   9.  Hyperlipidemia. Continue Lipitor. LDL reviewed- 124.  10. Uncontrolled Diabetes mellitus with hyperglycemia.  Hemoglobin A1c 10.1.  CBGs 143-171: Semglee 10 units daily.  Diabetic teaching. Maintain metformin to 500mg  BID- will not increase further given nausea with higher dose. Monitor creatinine. Discontinue ISS.  Recent Labs    11/10/20 1644 11/10/20 2122 11/11/20 0627  GLUCAP 115* 143* 115*   Mildly elevated on 9/19--no changes until he's eating more consistently 11.  Obesity.  BMI 32.86.  Dietary follow-up. Discontinue feeding supplement- try to promote regular foods. 12. Sub-optimal  potassium: start 04-21-1999 daily supplement. Continue supplement.  13. Right arm and leg spasticity:    -continue splinting, ROM  -maintain off baclofen given lethargy and much improved tone 14. Lethargy: d/c Baclofen and increase Ritalin to 10mg  BID 15. Transaminitis: d/c tylenol and monitor LFTs weekly. 16. Impaired initiation: good response to Ritalin-increase Ritalin to 10mg  BID.   -dc baclofen 17.  Essential hypertension  Norvasc 2.5 started on 9/18---increase to 5mg  9/19, continue as systolic elevated to 140 on 9/20.  18. Disposition: d/c 10/1 (29 days total). F/u with me on 10/21. Reaching out to family to let them know he will need 2 person support as well as  [employer]. Should initiate family training as soon as possible   LOS: 18 days A FACE TO FACE EVALUATION WAS PERFORMED  Drema Pry Vikas Wegmann 11/11/2020, 9:57 AM

## 2020-11-12 LAB — GLUCOSE, CAPILLARY
Glucose-Capillary: 139 mg/dL — ABNORMAL HIGH (ref 70–99)
Glucose-Capillary: 135 mg/dL — ABNORMAL HIGH (ref 70–99)
Glucose-Capillary: 135 mg/dL — ABNORMAL HIGH (ref 70–99)
Glucose-Capillary: 135 mg/dL — ABNORMAL HIGH (ref 70–99)

## 2020-11-12 NOTE — Progress Notes (Signed)
Speech Language Pathology Daily Session Note  Patient Details  Name: Raymond Kerr MRN: [id number] Date of Birth: March 11, 1955  Today's Date: 11/12/2020 SLP Individual Time: 9735-3299 SLP Individual Time Calculation (min): 40 min  Short Term Goals: Week 3: SLP Short Term Goal 1 (Week 3): Patient will demonstrate efficient mastication and oral clearance of dysphagia 3 solids with modA verbal, visual and tactile cues. SLP Short Term Goal 2 (Week 3): Patient will point to identify object pictures/photos in field of 4 when named or basic function/category described with 80% accuracy and modA tactile and verbal cues to initiate pointing. SLP Short Term Goal 3 (Week 3): Patient will name basic level objects/object pictures/photos at word level with modA phrase completion cues, initial phoneme cues. SLP Short Term Goal 4 (Week 3): Patient will initiate to perform gestures (pointing) and for performing basic functional tasks (toothbrushing, self-feeding) with min-modA verbal and tactile cues. SLP Short Term Goal 5 (Week 3): Patient will respond with yes/no verbal, gestural or yes/no communication board to basic/immediate needs questions with minA to initiate reponse.  Skilled Therapeutic Interventions:Skilled ST services focused on swallow and speech skills. NT handed off total A feeding breakfast to SLP. SLP scoops items on spoon and placed in pt's hand in which pt was able to bring to mouth for self-feeding with mod A verbal cues during meal. Pt demonstrated increase alertness and initiation compared to yesterday's session. Pt demonstrated increase response to yes/no questions pertaining wants/needs with immediate environment with head nods, nodding "yes" pt was unable to demonstrate this earlier in the week. Pt attempted x6 verbalizations however all were unintelligible except for "alright" at the end of the session. Pt demonstrated sustained attention in 5-7 minute intervals with mod A verbal cues. SLP set  up soft touch call bell in pt's new room and pt was able to return demonstration x2. Pt was left in room with call bell within reach and bed alarm set. SLP recommends to continue skilled services.     Pain Pain Assessment Pain Score: 0-No pain  Therapy/Group: Individual Therapy  Leilana Mcquire  [facility] 11/12/2020, 4:11 PM

## 2020-11-12 NOTE — Progress Notes (Signed)
Patient ID: Raymond Kerr, male   DOB: Jan 05, 1956, 65 y.o.   MRN: [id number] Spoke with daughter-[name] to apologize regarding not being told he was moving rooms and they were not allowed in when visited yesterday when did not know his room number. Updated her regarding team conference progress in therapies and the need for her to come in and see him in therapies to begin the training in preparation for discharge 10/1. She can come Sat from 1:00-3:00 pm and will go from there. Gave her the number for SS to call to get pt signed up for Medicare and can help with SS payments. Pt is doing better today and more alert since baclofen is coming out of his system. Daughter aware to call if has problems with SS sign up.

## 2020-11-12 NOTE — Progress Notes (Signed)
Physical Therapy Session Note  Patient Details  Name: Raymond Kerr MRN: [id number] Date of Birth: [birth year]  Today's Date: 11/12/2020 PT Individual Time: 0900-1000 PT Individual Time Calculation (min): 60 min   Short Term Goals: Week 3:  PT Short Term Goal 1 (Week 3): Pt will perform bed mobility with max assist of 1. PT Short Term Goal 2 (Week 3): Pt will complete bed<>chair transfers with maxA of 1 person PT Short Term Goal 3 (Week 3): Pt will maintain static standing for 2 minutes or greater with maxA of 1 person PT Short Term Goal 4 (Week 3): Pt will participate in family training to prepare for discharge  Skilled Therapeutic Interventions/Progress Updates:     Pt supine in bed at start of session - awake and appears agreeable to therapy. - does not show signs of pain during treatment. Pt noted to be heavily saturated of urine, bladder incontinent. Required +2 totalA for pericare and brief change at bed level. He's able to roll towards his R side with modA and requires totalA to roll to his L side - improved initiation noted compared to prior sessions. Supine<>sit required totalA of 1 person via log rolling technique with HOB Flat. Able to sit unsupported at EOB requiring minA, progressing to CGA. Completed lateral scoot squat<>pivot transfer with +2 totalA, requiring x3 scoots to achieve full transfer. Wheeled to ortho rehab gym in front of BITS system to work on initiation, attention, functional reaching with stronger LUE, and forward weight shift with reaching tasks. Worked on sequencing #1-#10 with 10% accuracy for selection and needing hand-over-hand assist for facilitating reaching. Needing max cues for visual scanning, especially to the R side. Pt returned to room at completion of session, remained reclined in TIS w/c with safety belt alarm on, all needs within reach.   Therapy Documentation Precautions:  Precautions Precautions: Fall Precaution Comments: R hemi, global  aphasia Restrictions Weight Bearing Restrictions: No General:    Therapy/Group: Individual Therapy  Savina Olshefski P Tierany Appleby 11/12/2020, 7:25 AM

## 2020-11-12 NOTE — Progress Notes (Signed)
Physical Therapy Session Note  Patient Details  Name: Raymond Kerr MRN: [id number] Date of Birth: 01-03-1956  Today's Date: 11/12/2020 PT Individual Time: 1500-1529 PT Individual Time Calculation (min): 29 min   Short Term Goals: Week 2:  PT Short Term Goal 1 (Week 2): Pt will complete bed mobility with maxA of 1 person PT Short Term Goal 2 (Week 2): Pt will complete dynamic sitting balance tasks with maxA for balance PT Short Term Goal 3 (Week 2): Pt will complete bed<>chair transfers with maxA of 1 person PT Short Term Goal 4 (Week 2): Pt will maintain static standing for 2 minutes or greater with maxA of 1 person  Skilled Therapeutic Interventions/Progress Updates:    Patient received sitting up in wc, agreeable to PT with brief head nod. He was unable to indicate if he had any pain, but did not appear to be in any pain. PT transporting patient in wc to therapy gym. Attempting initiation tasks with R LE, but patient unable to complete task. Patient was able to complete this task with L LE with minimal ROM, but appeared to not truly understand what was being asked of him. Patient not answering yes/no questions to clarify if he understands task. Tapping, deep pressure, vibration applied to R LE to attempt to achieve trace muscle activation- none felt. Patient returning to room in wc, seatbelt alarm on, call light within reach.   Therapy Documentation Precautions:  Precautions Precautions: Fall Precaution Comments: R hemi, global aphasia Restrictions Weight Bearing Restrictions: No     Therapy/Group: Individual Therapy  Elizebeth Koller, PT, DPT, CBIS  11/12/2020, 12:44 PM

## 2020-11-12 NOTE — Progress Notes (Signed)
PROGRESS NOTE   Subjective/Complaints: Continues to be very sleepy this morning Reviewed therapy note from [place]- demonstrating improved initiation but still requiring significant assist.   ROS: Limited due to cognitive/behavioral    Objective:   No results found. No results for input(s): WBC, HGB, HCT, PLT in the last 72 hours.   No results for input(s): NA, K, CL, CO2, GLUCOSE, BUN, CREATININE, CALCIUM in the last 72 hours.    Intake/Output Summary (Last 24 hours) at 11/12/2020 1014 Last data filed at 11/12/2020 0921 Gross per 24 hour  Intake 360 ml  Output --  Net 360 ml        Physical Exam: Vital Signs Blood pressure 120/88, pulse 85, temperature 98.3 F (36.8 C), temperature source Oral, resp. rate 18, height 6' (1.829 m), weight 109.9 kg, SpO2 100 %. Gen: no distress, normal appearing HEENT: oral mucosa pink and moist, NCAT Cardio: Reg rate Chest: normal effort, normal rate of breathing Abd: soft, non-distended Ext: no edema  Psych: flat, lethargic Skin: warm, intact Musc: No edema in extremities.  No tenderness in extremities. Neuro: Alert, right central 7. Has a hard time keeping eyes open. Leans to right.  Global aphasia Makes eye contact, engages temporarily, not following commands No resting tone  Assessment/Plan: 1. Functional deficits which require 3+ hours per day of interdisciplinary therapy in a comprehensive inpatient rehab setting. Physiatrist is providing close team supervision and 24 hour management of active medical problems listed below. Physiatrist and rehab team continue to assess barriers to discharge/monitor patient progress toward functional and medical goals  Care Tool:  Bathing    Body parts bathed by patient: Face, Right arm, Chest, Abdomen, Front perineal area   Body parts bathed by helper: Buttocks, Right upper leg, Left upper leg, Left lower leg, Right lower leg,  Left arm     Bathing assist Assist Level: Maximal Assistance - Patient 24 - 49%     Upper Body Dressing/Undressing Upper body dressing   What is the patient wearing?: Pull over shirt    Upper body assist Assist Level: Moderate Assistance - Patient 50 - 74%    Lower Body Dressing/Undressing Lower body dressing      What is the patient wearing?: Pants, Incontinence brief     Lower body assist Assist for lower body dressing: Total Assistance - Patient < 25% (bed level)     Toileting Toileting    Toileting assist Assist for toileting: 2 Helpers     Transfers Chair/bed transfer  Transfers assist  Chair/bed transfer activity did not occur: Safety/medical concerns  Chair/bed transfer assist level: 2 Helpers     Locomotion Ambulation   Ambulation assist   Ambulation activity did not occur: Safety/medical concerns          Walk 10 feet activity   Assist  Walk 10 feet activity did not occur: Safety/medical concerns        Walk 50 feet activity   Assist Walk 50 feet with 2 turns activity did not occur: Safety/medical concerns         Walk 150 feet activity   Assist Walk 150 feet activity did not occur: Safety/medical concerns  Walk 10 feet on uneven surface  activity   Assist Walk 10 feet on uneven surfaces activity did not occur: Safety/medical concerns         Wheelchair     Assist Is the patient using a wheelchair?:  (yes, per PT note) Type of Wheelchair:  (manual per PT note) Wheelchair activity did not occur: Safety/medical concerns         Wheelchair 50 feet with 2 turns activity    Assist    Wheelchair 50 feet with 2 turns activity did not occur: Safety/medical concerns       Wheelchair 150 feet activity     Assist  Wheelchair 150 feet activity did not occur: Safety/medical concerns       Blood pressure 120/88, pulse 85, temperature 98.3 F (36.8 C), temperature source Oral, resp. rate 18,  height 6' (1.829 m), weight 109.9 kg, SpO2 100 %.    Medical Problem List and Plan: 1.  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 2.  Impaired mobility -DVT/anticoagulation:  Pharmaceutical: Continue Lovenox             -antiplatelet therapy: Aspirin 81 mg daily and Plavix 75 mg daily x90 days then aspirin alone 3. Pain Management: Tylenol as needed 4. Mood: Provide emotional support             -antipsychotic agents: N/A 5. Neuropsych: This patient is not capable of making decisions on his own behalf. 6. Skin/Wound Care: Routine skin checks 7. Fluids/Electrolytes/Nutrition: Routine in and outs 8.  Post stroke dysphagia.  Mechanical soft thin liquids..  Patient limited p.o. intake.   Continue Megace to stimulate appetite follow-up speech therapy D/c remeron given worsening morning lethargy. Will try to determine what foods he likes- he does have an appetite but does not like the foods we are offering/D3 diet.   9.  Hyperlipidemia. Continue Lipitor. LDL reviewed- 124.  10. Uncontrolled Diabetes mellitus with hyperglycemia.  Hemoglobin A1c 10.1.  CBGs 143-171: Semglee 10 units daily.  Diabetic teaching. Maintain metformin to 500mg  BID- will not increase further given nausea with higher dose. Monitor creatinine. Discontinue ISS.  Recent Labs    11/11/20 1621 11/11/20 2111 11/12/20 0625  GLUCAP 124* 149* 139*   Mildly elevated on 9/21--no changes until he's eating more consistently 11.  Obesity.  BMI 32.86.  Dietary follow-up. Discontinue feeding supplement- try to promote regular foods. 12. Sub-optimal potassium: start 06-06-1976 daily supplement. Continue supplement.  13. Right arm and leg spasticity:    -continue splinting, ROM  -maintain off baclofen given lethargy and  much improved tone 14. Lethargy: d/c Baclofen and increase Ritalin to 10mg  BID 15. Transaminitis: d/c tylenol and monitor LFTs weekly. 16. Impaired initiation: good response to Ritalin-increase Ritalin to 10mg  BID.   -dc baclofen 17.  Essential hypertension  Norvasc 2.5 started on 9/18---increase to 5mg  9/19, continue as systolic elevated to 140 on 9/20.  18. Disposition: d/c 10/1 (29 days total). F/u with me on 10/21. Reaching out to family to let them know he will need 2 person support as well as . Should initiate family training as soon as possible   LOS: 19 days A FACE TO FACE EVALUATION WAS PERFORMED  Kristy Catoe P Marsean Elkhatib 11/12/2020, 10:14 AM

## 2020-11-12 NOTE — Progress Notes (Signed)
Occupational Therapy Session Note  Patient Details  Name: Raymond Kerr MRN: [id number] Date of Birth: 1956/01/18  Today's Date: 11/12/2020 OT Individual Time: 1306-1400 OT Individual Time Calculation (min): 54 min    Short Term Goals: Week 3:  OT Short Term Goal 1 (Week 3): Pt will don tshirt with mod A. OT Short Term Goal 2 (Week 3): Pt will demonstrate improved sit balance to sit EOB with min A while bathing UB with min a. OT Short Term Goal 3 (Week 3): Pt will scan plate and initiate self feeding using left hand with supervision. OT Short Term Goal 4 (Week 3): Pt will bathe UB with min assist.  Skilled Therapeutic Interventions/Progress Updates:  Pt received seated in w/c nodding "no" to pain and agreeable to OT intervention ( nodding "yes"). Pt transported to therapy gym with total A. Pt required total A +2 for squat pivot transfer from TIS>EOM going towards pts R side. Pt completed various therapeutic activities focused on dynamic sitting balance, NMR and core strength. Pt completed dynamic reach across midline with LUE with pt leaning on to RUE to provide NMR to hemiparetic UE with pt instructed to retrieve clothespins positioned on mirror in front of pt. Pt required MAX to laterally lean to R and return back to midline. Graded activity up and had pt anteriorly weight shift forward to retrieve clothespins with LUE, pt required MAX A +2 to anteriorly shift weight. Pt completed modified crunches with therapy ball positioned behind pt with pt needing MAX A to return to midline, x20 reps total. Pt completed additional squat pivot transfer from EOM>TIS with total A +2. Pt returned to room with total A where pt left up in TIS with alarm belt activated and all needs within reach.   Therapy Documentation Precautions:  Precautions Precautions: Fall Precaution Comments: R hemi, global aphasia Restrictions Weight Bearing Restrictions: No  Pain: no pain indicated during session.      Therapy/Group: Individual Therapy  Barron Schmid 11/12/2020, 3:35 PM

## 2020-11-13 LAB — BASIC METABOLIC PANEL
Anion gap: 13 (ref 5–15)
BUN: 20 mg/dL (ref 8–23)
CO2: 19 mmol/L — ABNORMAL LOW (ref 22–32)
Calcium: 9.6 mg/dL (ref 8.9–10.3)
Chloride: 104 mmol/L (ref 98–111)
Creatinine, Ser: 1.25 mg/dL — ABNORMAL HIGH (ref 0.61–1.24)
GFR, Estimated: >60 mL/min (ref >60)
Glucose, Bld: 200 mg/dL — ABNORMAL HIGH (ref 70–99)
Potassium: 4.5 mmol/L (ref 3.5–5.1)
Sodium: 136 mmol/L (ref 135–145)

## 2020-11-13 LAB — URINALYSIS, ROUTINE W REFLEX MICROSCOPIC
Glucose, UA: NEGATIVE mg/dL
Ketones, ur: NEGATIVE mg/dL
Leukocytes,Ua: NEGATIVE
Nitrite: NEGATIVE
Protein, ur: >300 mg/dL — AB
Specific Gravity, Urine: >1.03 — ABNORMAL HIGH (ref 1.005–1.030)
pH: 5.5 (ref 5.0–8.0)

## 2020-11-13 LAB — GLUCOSE, CAPILLARY
Glucose-Capillary: 140 mg/dL — ABNORMAL HIGH (ref 70–99)
Glucose-Capillary: 219 mg/dL — ABNORMAL HIGH (ref 70–99)
Glucose-Capillary: 204 mg/dL — ABNORMAL HIGH (ref 70–99)
Glucose-Capillary: 207 mg/dL — ABNORMAL HIGH (ref 70–99)

## 2020-11-13 LAB — URINALYSIS, MICROSCOPIC (REFLEX): Bacteria, UA: NONE SEEN

## 2020-11-13 MED ORDER — ENSURE MAX PROTEIN PO LIQD
11.0000 [oz_av] | Freq: Two times a day (BID) | ORAL | Status: DC
  Administered 2020-11-13: 11 [oz_av] via ORAL

## 2020-11-13 MED ORDER — GLIMEPIRIDE 1 MG PO TABS
4.0000 mg | ORAL_TABLET | Freq: Every day | ORAL | Status: DC
  Administered 2020-11-14 – 2020-11-19 (×5): 4 mg via ORAL
  Filled 2020-11-13 (×5): qty 4

## 2020-11-13 NOTE — Progress Notes (Signed)
Nutrition Follow-up  DOCUMENTATION CODES:   Obesity unspecified  INTERVENTION:  Provide Ensure Max po BID, each supplement provides 150 kcal and 30 grams of protein.   Encourage adequate PO intake.  NUTRITION DIAGNOSIS:   Increased nutrient needs related to acute illness as evidenced by estimated needs; ongoing  GOAL:   Patient will meet greater than or equal to 90% of their needs; progressing  MONITOR:   PO intake, Supplement acceptance, Skin, Weight trends, Labs, I & O's  REASON FOR ASSESSMENT:   Malnutrition Screening Tool    ASSESSMENT:   65 year old right-handed male with history of hypertension, diabetes mellitus as well as hyperlipidemia. Presented 10/06/2020 with dysarthria, diplopia, incoordination and right facial drop. Cranial CT scan showed low-density area within the left thalamus and right frontal lobe compatible with subacute to chronic infarcts. MRI /MRA small acute infarcts of the left paramedian frontal lobe and ventral medial left thalamus. Repeat MRI revealed extension of both his left frontal and brainstem infarcts as well as new watershed infarct in the internal border zone on the left. Therapy evaluations completed due to patient's aphasia and decreased functional mobility was admitted for a comprehensive rehab program.  No family at bedside. Pt able to nod head to RD assessment. Meal completion has been 25-75% with recent intake at 60-75%. Ensure Enlive supplements have been discontinued. RD to order Ensure Max instead to aid in caloric and protein needs. Pt encouraged to eat his food at meals and to drink his supplements.   NUTRITION - FOCUSED PHYSICAL EXAM:  Flowsheet Row Most Recent Value  Orbital Region No depletion  Upper Arm Region No depletion  Thoracic and Lumbar Region No depletion  Buccal Region No depletion  Temple Region No depletion  Clavicle Bone Region No depletion  Clavicle and Acromion Bone Region No depletion  Scapular Bone Region  Unable to assess  Dorsal Hand No depletion  Patellar Region No depletion  Anterior Thigh Region No depletion  Posterior Calf Region No depletion  Edema (RD Assessment) Mild  Hair Reviewed  Eyes Reviewed  Mouth Reviewed  Skin Reviewed  Nails Reviewed      Labs and medications reviewed.   Diet Order:   Diet Order             DIET DYS 3 Room service appropriate? Yes with Assist; Fluid consistency: Thin  Diet effective now                   EDUCATION NEEDS:   Not appropriate for education at this time  Skin:  Skin Assessment: Reviewed RN Assessment  Last BM:  9/22  Height:   Ht Readings from Last 1 Encounters:  10/24/20 6' (1.829 m)    Weight:   Wt Readings from Last 1 Encounters:  11/07/20 109.9 kg   BMI:  Body mass index is 32.86 kg/m.  Estimated Nutritional Needs:   Kcal:  2150-2350  Protein:  115-130 grams  Fluid:  >/= 2 L/day  Roslyn Smiling, MS, RD, LDN RD pager number/after hours weekend pager number on Amion.

## 2020-11-13 NOTE — Progress Notes (Signed)
Physical Therapy Session Note  Patient Details  Name: Raymond Kerr MRN: [id number] Date of Birth: 1956/02/05  Today's Date: 11/13/2020 PT Individual Time: 8280-0349 PT Individual Time Calculation (min): 39 min   Short Term Goals: Week 1:  PT Short Term Goal 1 (Week 1): Pt will perform bed mobility with max assist of 1. PT Short Term Goal 1 - Progress (Week 1): Not met PT Short Term Goal 2 (Week 1): Pt will perform sit<>stand with total A of 1. PT Short Term Goal 2 - Progress (Week 1): Not met PT Short Term Goal 3 (Week 1): Pt will initiate gait training PT Short Term Goal 3 - Progress (Week 1): Not met PT Short Term Goal 4 (Week 1): Pt will tolerate sitting in WC >2 hours between therapies PT Short Term Goal 4 - Progress (Week 1): Met Week 2:  PT Short Term Goal 1 (Week 2): Pt will complete bed mobility with maxA of 1 person PT Short Term Goal 2 (Week 2): Pt will complete dynamic sitting balance tasks with maxA for balance PT Short Term Goal 3 (Week 2): Pt will complete bed<>chair transfers with maxA of 1 person PT Short Term Goal 4 (Week 2): Pt will maintain static standing for 2 minutes or greater with maxA of 1 person Week 3:  PT Short Term Goal 1 (Week 3): Pt will perform bed mobility with max assist of 1. PT Short Term Goal 2 (Week 3): Pt will complete bed<>chair transfers with maxA of 1 person PT Short Term Goal 3 (Week 3): Pt will maintain static standing for 2 minutes or greater with maxA of 1 person PT Short Term Goal 4 (Week 3): Pt will participate in family training to prepare for discharge  Skilled Therapeutic Interventions/Progress Updates:  Pt received seated in TIS in room, denied pain by shaking head "no". Lateral board transfer from TIS to EOB on L side w/total A x2. Pt unable to initiate transfer despite multimodal cues and extra time for initiation. Sit <>stand x3 from elevated EOB w/max A x2 3-musketeer style for improved LE strength and midline orientation. Pt  demonstrated heavy R lean, unable to keep R foot flat on ground and required max A for upright posture and R knee block. Intermittent verbal cues for L weight shift, pt able to initiate but unable to hold longer than 5s. Sit <>supine w/max A x2 for trunk support and BLE management. Scoot to [facility] w/total A x2 and noted significant protrusion on pt's abdomen, superomedial to navel. Notified nursing. Pt was left supine in bed, all needs in reach, nursing present to examine abdomen.   Therapy Documentation Precautions:  Precautions Precautions: Fall Precaution Comments: R hemi, global aphasia Restrictions Weight Bearing Restrictions: No   Therapy/Group: Individual Therapy Cruzita Lederer Melodee Lupe, PT, DPT  11/13/2020, 7:47 AM

## 2020-11-13 NOTE — Progress Notes (Signed)
Speech Language Pathology Daily Session Note  Patient Details  Name: Raymond Kerr MRN: [id number] Date of Birth: Oct 13, 1955  Today's Date: 11/13/2020 SLP Individual Time: 3267-1245 SLP Individual Time Calculation (min): 45 min  Short Term Goals: Week 3: SLP Short Term Goal 1 (Week 3): Patient will demonstrate efficient mastication and oral clearance of dysphagia 3 solids with modA verbal, visual and tactile cues. SLP Short Term Goal 2 (Week 3): Patient will point to identify object pictures/photos in field of 4 when named or basic function/category described with 80% accuracy and modA tactile and verbal cues to initiate pointing. SLP Short Term Goal 3 (Week 3): Patient will name basic level objects/object pictures/photos at word level with modA phrase completion cues, initial phoneme cues. SLP Short Term Goal 4 (Week 3): Patient will initiate to perform gestures (pointing) and for performing basic functional tasks (toothbrushing, self-feeding) with min-modA verbal and tactile cues. SLP Short Term Goal 5 (Week 3): Patient will respond with yes/no verbal, gestural or yes/no communication board to basic/immediate needs questions with minA to initiate reponse.  Skilled Therapeutic Interventions:   Patient seen for skilled ST session focused on aphasia and dysphagia goals. Patient was finishing breakfast with NT when SLP entered room. He continues to demonstrate prolonged mastication with dysphagia 3 solids but only trace to minimal amount of oral residuals observed when completing oral care. He did also exhibit some hiccuping which resolved after drinking liquids. During language treatment portion of session, patient named 10 of 15 objects with initial phoneme, partial word and phrase completion cue. He named 2 objects without cues (ball, brush). He pointed to identify opposites photos (clean/dirty, open/closed, etc) and was 70% accuracy. Of note, patient required only minA cues overall to initiate  pointing. Patient continues to benefit from skilled SLP intervention to maximize speech-language and swallow function prior to discharge.  Pain Pain Assessment Pain Scale: Faces Pain Score: 0-No pain Faces Pain Scale: No hurt  Therapy/Group: Individual Therapy  Angela Nevin, MA, CCC-SLP Speech Therapy

## 2020-11-13 NOTE — Progress Notes (Signed)
Physical Therapy Session Note  Patient Details  Name: Patterson Hollenbaugh MRN: [id number] Date of Birth: 1955-04-23  Today's Date: 11/13/2020 PT Individual Time: 0915-1024 PT Individual Time Calculation (min): 69 min   Short Term Goals: Week 3:  PT Short Term Goal 1 (Week 3): Pt will perform bed mobility with max assist of 1. PT Short Term Goal 2 (Week 3): Pt will complete bed<>chair transfers with maxA of 1 person PT Short Term Goal 3 (Week 3): Pt will maintain static standing for 2 minutes or greater with maxA of 1 person PT Short Term Goal 4 (Week 3): Pt will participate in family training to prepare for discharge   Skilled Therapeutic Interventions/Progress Updates:     Pt supine in bed to start session - awake and appears agreeable to therapy Tx. Shakes head 'no' to pain. Donned TED's and shoes at bed level with totalA. Required +2 totalA for donning pants at bed leve with rolling +2 totalA in bed, despite use of bed rails and time for initiation. Supine<>sit completed with 1 person, maxA, with HOB flat and use of log roll technique, towards R EOB. Completes lateral scoot/squat<>pivot transfer with +2 maxA - completes in x2 scoots - max facilitation for forward weight shift to achieve lifting of the hips. Transported patient in w/c to main rehab gym for time and assisted to mat table in similar manner as above. At edge of mat, worked on sit<>stand transfers with +2 maxA via 3-muskateers technique. Completed a total of 5 stands during session and for each stand, was able to maintain for ~3-4 minutes with +2 maxA for standing balance due to STRONG pushing to the paretic R side. In standing, was able to work on reaching with LUE to targets on mirror and removing horseshoes from top of mirror with +2 maxA for balance. In sitting, worked on lateral reaching towards his stronger L side to reduce pushing R and also placing weighted balls in a chair in front of him, requiring him to forward weight shift to  reach. He completed squat<>pivot transfer with +2 maxA back to his w/c in similar method as above. Returned to his room, remained reclined in w/c with safety belt alarm on and all his needs in reach.   Therapy Documentation Precautions:  Precautions Precautions: Fall Precaution Comments: R hemi, global aphasia Restrictions Weight Bearing Restrictions: No General:    Therapy/Group: Individual Therapy  Orrin Brigham 11/13/2020, 7:28 AM

## 2020-11-13 NOTE — Progress Notes (Signed)
Occupational Therapy Session Note  Patient Details  Name: Raymond Kerr MRN: [id number] Date of Birth: 12/07/1955  Today's Date: 11/13/2020 OT Individual Time: 1345-1425 OT Individual Time Calculation (min): 40 min    Short Term Goals: Week 3:  OT Short Term Goal 1 (Week 3): Pt will don tshirt with mod A. OT Short Term Goal 2 (Week 3): Pt will demonstrate improved sit balance to sit EOB with min A while bathing UB with min a. OT Short Term Goal 3 (Week 3): Pt will scan plate and initiate self feeding using left hand with supervision. OT Short Term Goal 4 (Week 3): Pt will bathe UB with min assist.  Skilled Therapeutic Interventions/Progress Updates:    Pt resting in TIS w/c upon arrival. Pt's LLE was not resting on leg rest and was off to side of leg rest. Foot place needed adjusting and pt placed LLE on leg rest. Both leg rests adjusted for comfort. Pt's head rest also needed adjusting to provided support when tilted in w/c. Intervention included RLE stretching and PROM. Pt instructed in SROM and return demonstrated with multimodal cues. Pt remained in w/c with all needs within reach and belt alarm activated.   Therapy Documentation Precautions:  Precautions Precautions: Fall Precaution Comments: R hemi, global aphasia Restrictions Weight Bearing Restrictions: No Pain: Pain Assessment Pain Scale: Faces Pain Score: 0-No pain Faces Pain Scale: No hurt   Therapy/Group: Individual Therapy  Rich Brave 11/13/2020, 2:28 PM

## 2020-11-13 NOTE — Progress Notes (Signed)
PROGRESS NOTE   Subjective/Complaints: Continues to be very sleepy this morning Reviewed therapy note from [place]- demonstrating improved initiation but still requiring significant assist.   ROS: Limited due to cognitive/behavioral    Objective:   No results found. No results for input(s): WBC, HGB, HCT, PLT in the last 72 hours.   No results for input(s): NA, K, CL, CO2, GLUCOSE, BUN, CREATININE, CALCIUM in the last 72 hours.    Intake/Output Summary (Last 24 hours) at 11/13/2020 1234 Last data filed at 11/13/2020 0900 Gross per 24 hour  Intake 320 ml  Output 700 ml  Net -380 ml        Physical Exam: Vital Signs Blood pressure 127/82, pulse 91, temperature 98.6 F (37 C), temperature source Oral, resp. rate 14, height 6' (1.829 m), weight 109.9 kg, SpO2 100 %. Gen: no distress, normal appearing HEENT: oral mucosa pink and moist, NCAT Cardio: Reg rate Chest: normal effort, normal rate of breathing Abd: soft, non-distended Ext: no edema   Psych: flat, lethargic Skin: warm, intact Musc: No edema in extremities.  No tenderness in extremities. Neuro: Alert, right central 7. Has a hard time keeping eyes open. Leans to right.  Global aphasia Makes eye contact, engages temporarily, not following commands No resting tone  Assessment/Plan: 1. Functional deficits which require 3+ hours per day of interdisciplinary therapy in a comprehensive inpatient rehab setting. Physiatrist is providing close team supervision and 24 hour management of active medical problems listed below. Physiatrist and rehab team continue to assess barriers to discharge/monitor patient progress toward functional and medical goals  Care Tool:  Bathing    Body parts bathed by patient: Face, Right arm, Chest, Abdomen, Front perineal area   Body parts bathed by helper: Buttocks, Right upper leg, Left upper leg, Left lower leg, Right lower  leg, Left arm     Bathing assist Assist Level: Maximal Assistance - Patient 24 - 49%     Upper Body Dressing/Undressing Upper body dressing   What is the patient wearing?: Pull over shirt    Upper body assist Assist Level: Moderate Assistance - Patient 50 - 74%    Lower Body Dressing/Undressing Lower body dressing      What is the patient wearing?: Pants, Incontinence brief     Lower body assist Assist for lower body dressing: Total Assistance - Patient < 25% (bed level)     Toileting Toileting    Toileting assist Assist for toileting: 2 Helpers     Transfers Chair/bed transfer  Transfers assist  Chair/bed transfer activity did not occur: Safety/medical concerns  Chair/bed transfer assist level: 2 Helpers     Locomotion Ambulation   Ambulation assist   Ambulation activity did not occur: Safety/medical concerns          Walk 10 feet activity   Assist  Walk 10 feet activity did not occur: Safety/medical concerns        Walk 50 feet activity   Assist Walk 50 feet with 2 turns activity did not occur: Safety/medical concerns         Walk 150 feet activity   Assist Walk 150 feet activity did not occur: Safety/medical concerns  Walk 10 feet on uneven surface  activity   Assist Walk 10 feet on uneven surfaces activity did not occur: Safety/medical concerns         Wheelchair     Assist Is the patient using a wheelchair?:  (yes, per PT note) Type of Wheelchair:  (manual per PT note) Wheelchair activity did not occur: Safety/medical concerns         Wheelchair 50 feet with 2 turns activity    Assist    Wheelchair 50 feet with 2 turns activity did not occur: Safety/medical concerns       Wheelchair 150 feet activity     Assist  Wheelchair 150 feet activity did not occur: Safety/medical concerns       Blood pressure 127/82, pulse 91, temperature 98.6 F (37 C), temperature source Oral, resp. rate 14,  height 6' (1.829 m), weight 109.9 kg, SpO2 100 %.    Medical Problem List and Plan: 1.  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 2.  Impaired mobility -DVT/anticoagulation:  Pharmaceutical: Continue Lovenox             -antiplatelet therapy: Aspirin 81 mg daily and Plavix 75 mg daily x90 days then aspirin alone 3. Pain Management: Tylenol as needed 4. Mood: Provide emotional support             -antipsychotic agents: N/A 5. Neuropsych: This patient is not capable of making decisions on his own behalf. 6. Skin/Wound Care: Routine skin checks 7. Fluids/Electrolytes/Nutrition: Routine in and outs 8.  Post stroke dysphagia.  Mechanical soft thin liquids..  Patient limited p.o. intake.   Discontinue Megace since eating better D/c remeron given worsening morning lethargy. Will try to determine what foods he likes- he does have an appetite but does not like the foods we are offering/D3 diet.   9.  Hyperlipidemia. Continue Lipitor. LDL reviewed- 124.  10. Uncontrolled Diabetes mellitus with hyperglycemia.  Hemoglobin A1c 10.1.  CBGs 143-171: Semglee 10 units daily.  Diabetic teaching. Maintain metformin to 500mg  BID- will not increase further given nausea with higher dose. Monitor creatinine. Discontinue ISS.  Recent Labs    11/12/20 2131 11/13/20 0617 11/13/20 1158  GLUCAP 135* 140* 219*   Elevated to 219: will discontinue Megace since eating better.  11.  Obesity.  BMI 32.86.  Dietary follow-up. Discontinue feeding supplement- try to promote regular foods. 12. Sub-optimal potassium: start 11/15/20 daily supplement. Continue supplement.  13. Right arm and leg spasticity:    -continue splinting, ROM  -maintain off baclofen given lethargy and much improved tone 14.  Lethargy: d/c Baclofen and increase Ritalin to 10mg  BID 15. Transaminitis: d/c tylenol and monitor LFTs weekly. 16. Impaired initiation: good response to Ritalin-increase Ritalin to 10mg  BID.   -dc baclofen 17.  Essential hypertension  Norvasc 2.5 started on 9/18---increase to 5mg  9/19, continue as systolic elevated to 140 on 9/20.  18. Disposition: d/c 10/1 (29 days total). F/u with me on 10/21. Reaching out to family to let them know he will need 2 person support as well as . Should initiate family training as soon as possible   LOS: 20 days A FACE TO FACE EVALUATION WAS PERFORMED  10/19 11/13/2020, 12:34 PM

## 2020-11-13 NOTE — Progress Notes (Signed)
Patient ID: Raymond Kerr, male   DOB: 1955/05/27, 65 y.o.   MRN: [id number]  Spoke with daughter-[name] who did call Social Security to sign Dad up for medicare. Told will need to apply on Jan 2023 and coverage will begin July 2023. She plans to apply for medicaid for him and see if eligible. She is also working on [employer] for him. Will need to apply for Disability since not full retirement age until 19 and 3 months. She will come Sat from 1-3 pm to observe and begin family education. Continue to work on discharge needs.

## 2020-11-14 ENCOUNTER — Inpatient Hospital Stay (HOSPITAL_COMMUNITY): Payer: Medicare Other

## 2020-11-14 LAB — HEPATIC FUNCTION PANEL
ALT: 134 U/L — ABNORMAL HIGH (ref 0–44)
AST: 111 U/L — ABNORMAL HIGH (ref 15–41)
Albumin: 3 g/dL — ABNORMAL LOW (ref 3.5–5.0)
Alkaline Phosphatase: 67 U/L (ref 38–126)
Bilirubin, Direct: 0.1 mg/dL (ref 0.0–0.2)
Indirect Bilirubin: 0.6 mg/dL (ref 0.3–0.9)
Total Bilirubin: 0.7 mg/dL (ref 0.3–1.2)
Total Protein: 7.2 g/dL (ref 6.5–8.1)

## 2020-11-14 LAB — GLUCOSE, CAPILLARY
Glucose-Capillary: 151 mg/dL — ABNORMAL HIGH (ref 70–99)
Glucose-Capillary: 193 mg/dL — ABNORMAL HIGH (ref 70–99)
Glucose-Capillary: 113 mg/dL — ABNORMAL HIGH (ref 70–99)
Glucose-Capillary: 107 mg/dL — ABNORMAL HIGH (ref 70–99)

## 2020-11-14 LAB — CBC
HCT: 42.5 % (ref 39.0–52.0)
Hemoglobin: 14.1 g/dL (ref 13.0–17.0)
MCH: 29 pg (ref 26.0–34.0)
MCHC: 33.2 g/dL (ref 30.0–36.0)
MCV: 87.3 fL (ref 80.0–100.0)
Platelets: 311 10*3/uL (ref 150–400)
RBC: 4.87 MIL/uL (ref 4.22–5.81)
RDW: 14.2 % (ref 11.5–15.5)
WBC: 6.5 10*3/uL (ref 4.0–10.5)
nRBC: 0 % (ref 0.0–0.2)

## 2020-11-14 LAB — URINE CULTURE: Culture: NO GROWTH

## 2020-11-14 LAB — VITAMIN D 25 HYDROXY (VIT D DEFICIENCY, FRACTURES): Vit D, 25-Hydroxy: 21.06 ng/mL — ABNORMAL LOW (ref 30–100)

## 2020-11-14 IMAGING — US US ABDOMEN LIMITED
1 series · 14 of 25 positions shown · non-contrast
Comparison: None.

CLINICAL DATA: Elevated LFTs

EXAM:
ULTRASOUND ABDOMEN LIMITED RIGHT UPPER QUADRANT

[Series 1: us abdomen limited ruq (liver/gb) · 14 of 58 slices shown]
[im 1/58]
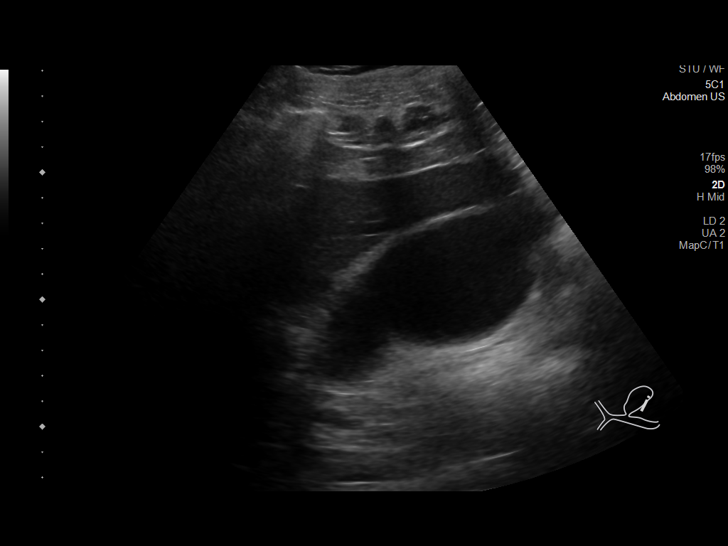
[im 5/58]
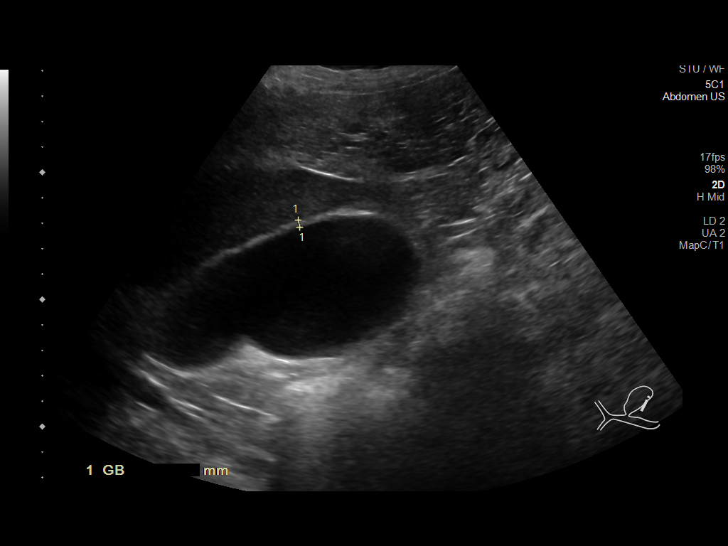
[im 10/58]
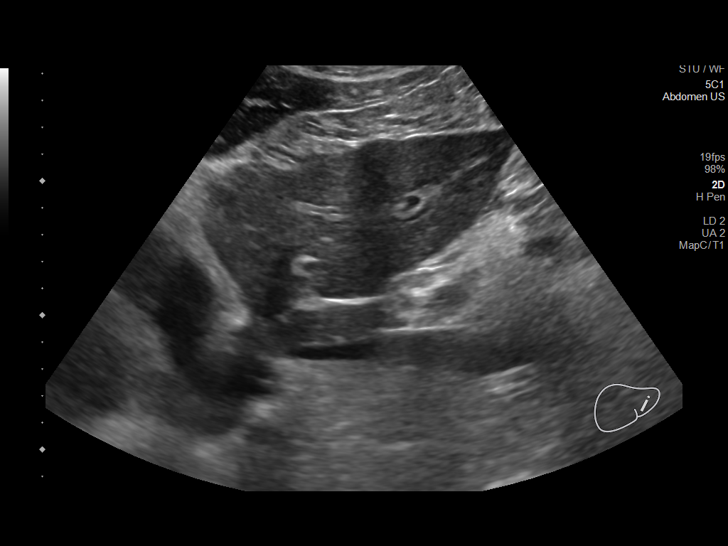
[im 15/58]
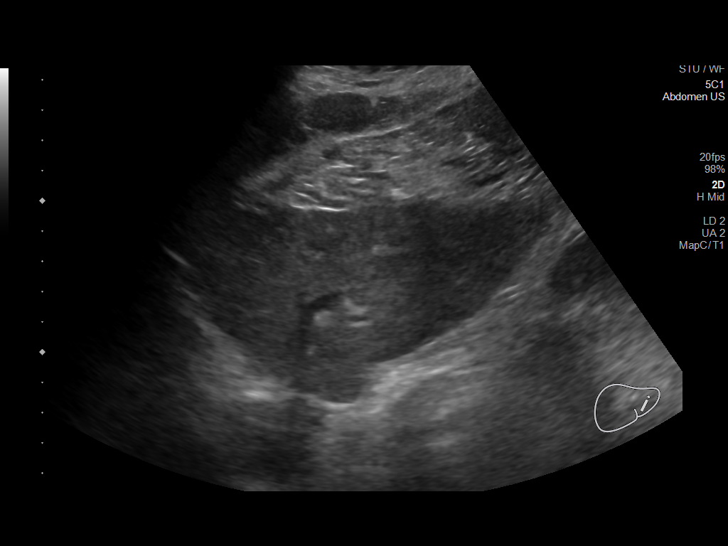
[im 20/58]
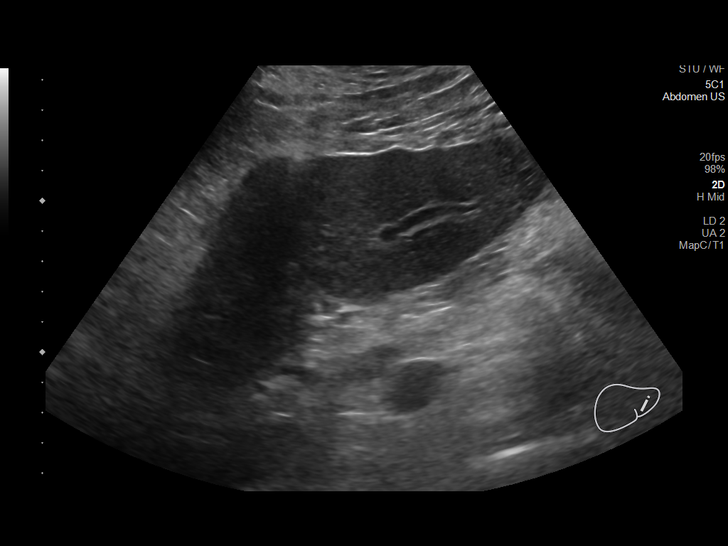
[im 22/58]
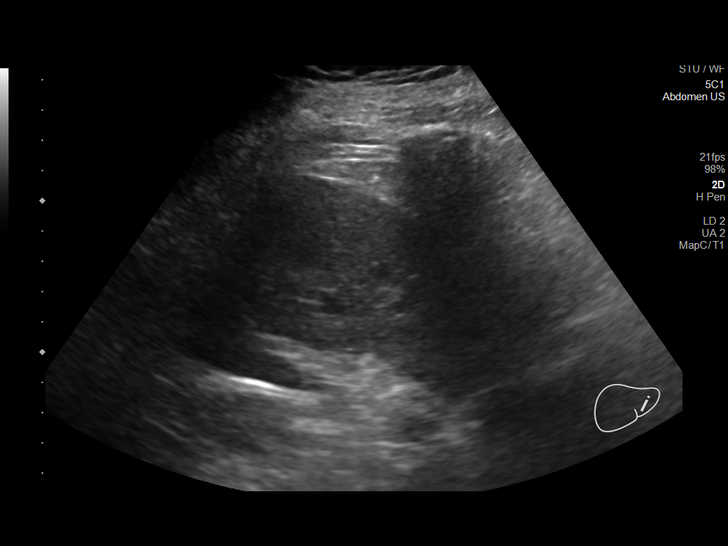
[im 27/58]
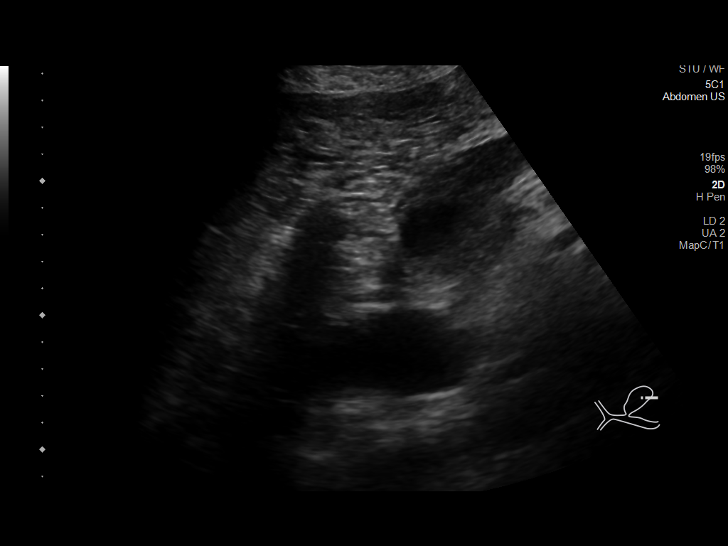
[im 31/58]
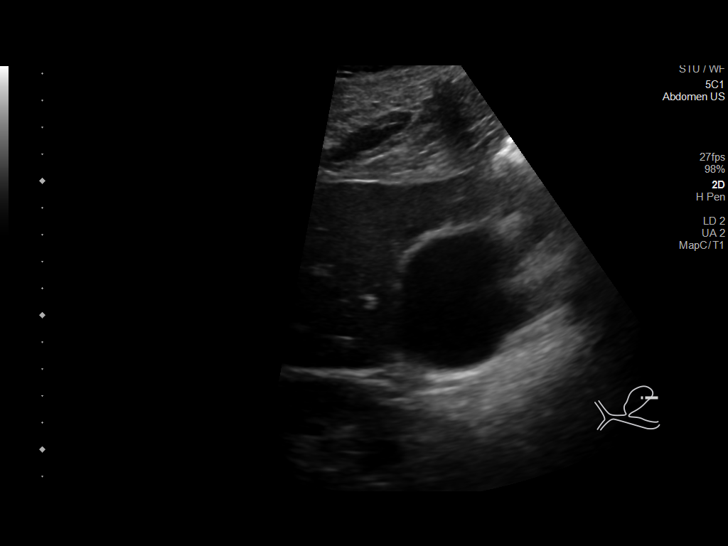
[im 36/58]
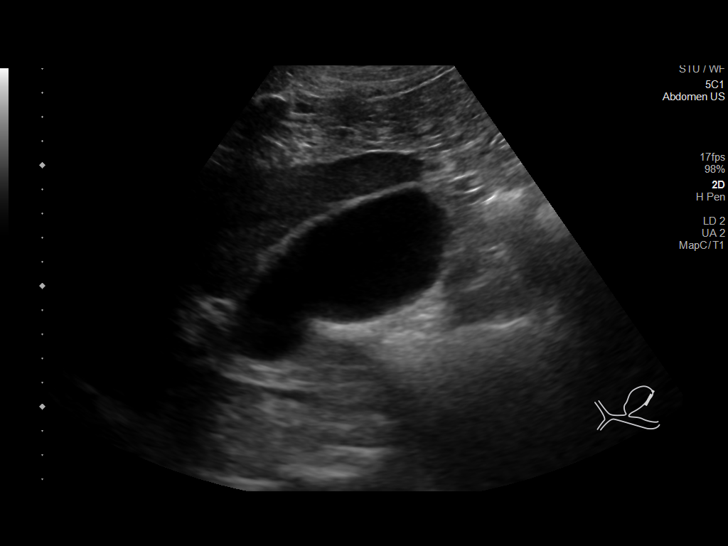
[im 39/58]
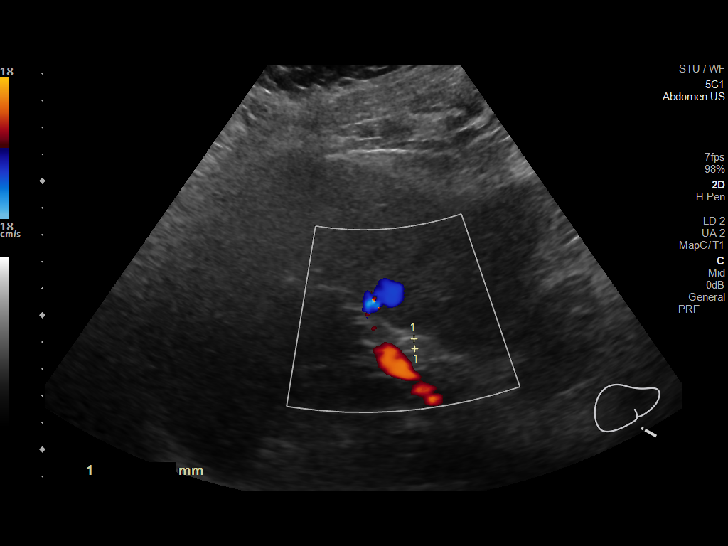
[im 43/58]
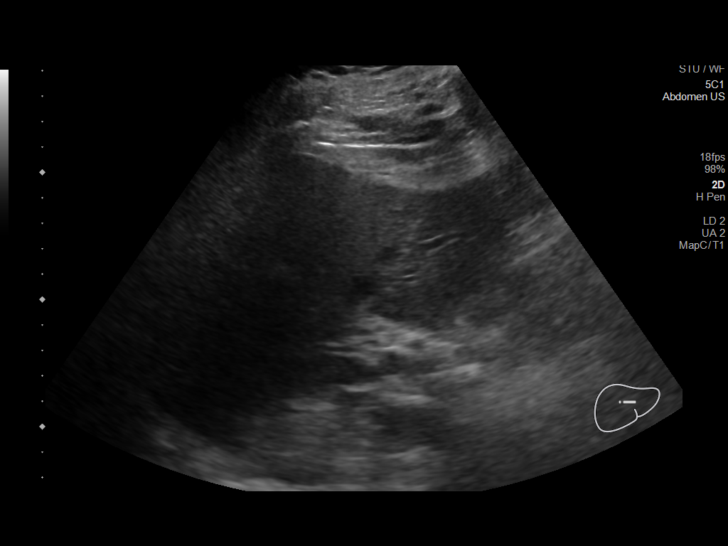
[im 48/58]
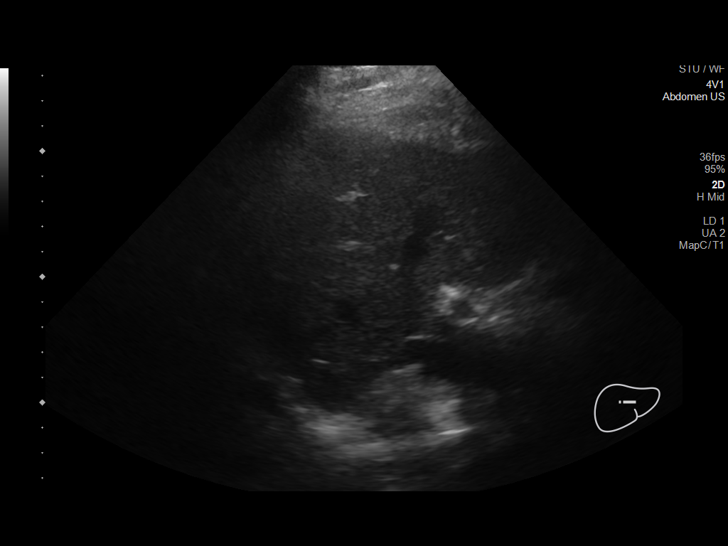
[im 53/58]
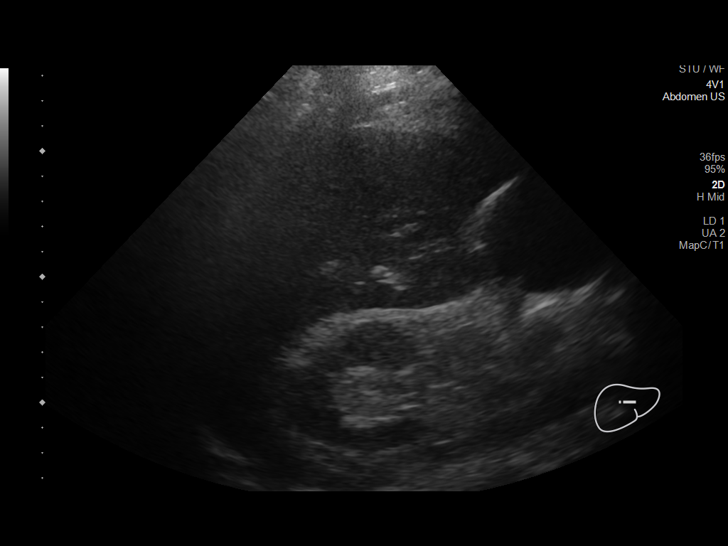
[im 58/58]
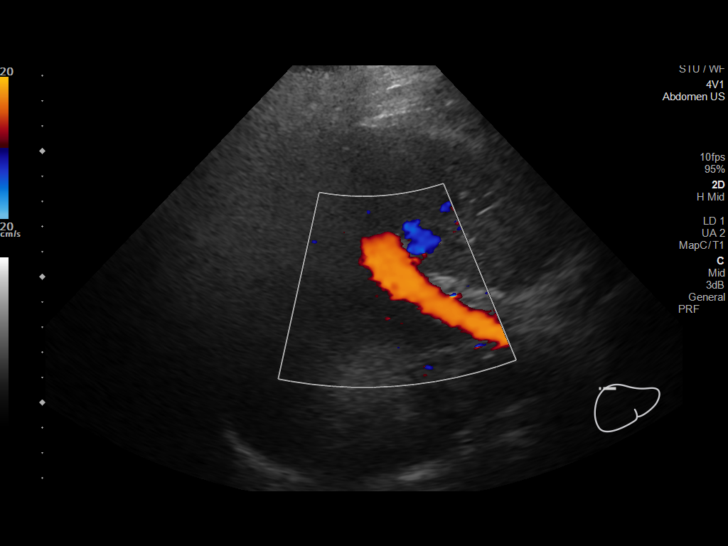

[14 of 25 positions shown; findings below may reference images not displayed]

FINDINGS: Gallbladder:

No gallstones or wall thickening visualized. No sonographic Murphy
sign noted by sonographer.

Common bile duct:

Diameter: 4 mm

Liver:

No focal lesion identified. Heterogeneous liver echotexture with
nodular surface contour. Portal vein is patent on color Doppler
imaging with normal direction of blood flow towards the liver.

Other: None.

Technical note: Examination is limited secondary to poor penetration
related to patient body habitus.
IMPRESSION: 1. Heterogeneous liver echotexture with nodular surface contour.
Sonographic appearance suggestive of cirrhosis. No focal liver
lesion is identified.
2. Unremarkable gallbladder.

## 2020-11-14 MED ORDER — VITAMIN D (ERGOCALCIFEROL) 1.25 MG (50000 UNIT) PO CAPS
50000.0000 [IU] | ORAL_CAPSULE | ORAL | Status: DC
  Administered 2020-11-14: 50000 [IU] via ORAL
  Filled 2020-11-14 (×2): qty 1

## 2020-11-14 MED ORDER — SODIUM CHLORIDE 0.9 % IV BOLUS
250.0000 mL | Freq: Once | INTRAVENOUS | Status: AC
  Administered 2020-11-14: 250 mL via INTRAVENOUS

## 2020-11-14 NOTE — Progress Notes (Signed)
Physical Therapy Session Note  Patient Details  Name: Raymond Kerr MRN: [id number] Date of Birth: May 20, 1955  Today's Date: 11/14/2020 PT Individual Time: 0910-0948 PT Individual Time Calculation (min): 38 min   Short Term Goals: Week 3:  PT Short Term Goal 1 (Week 3): Pt will perform bed mobility with max assist of 1. PT Short Term Goal 2 (Week 3): Pt will complete bed<>chair transfers with maxA of 1 person PT Short Term Goal 3 (Week 3): Pt will maintain static standing for 2 minutes or greater with maxA of 1 person PT Short Term Goal 4 (Week 3): Pt will participate in family training to prepare for discharge  Skilled Therapeutic Interventions/Progress Updates:   Pt received supine in bed.TRolling R and L with max assist to the R and total A* to the L to don pants. PT donned teds and grip socks with total A*. Supine>sit transfer with total A* and max cues for initiation of movement in UE and LE. Sitting balance EOB with supervision assist with LUE supported on foot board forcing while L weight shift into improve midline. PT assisted pt to perform Slide Board transfer to TIS WC. Pt unable to initiate movement to perform transfer to the L with mild pushers response limiting safety of transfer to the L without +2. Slide board transfer to  [facility] on the R with total A to allow pushers response to aid with lateral pelvic translation. Pt performed posterior scooting in WC with max assist and WC reclined to improve posterior movement of pelvis. Pt left sitting in WC with call bell in reach and all needs met.        Therapy Documentation Precautions:  Precautions Precautions: Fall Precaution Comments: R hemi, global aphasia Restrictions Weight Bearing Restrictions: No   Pain: Faces: no hurt     Therapy/Group: Individual Therapy  Lorie Phenix 11/14/2020, 10:47 AM

## 2020-11-14 NOTE — Progress Notes (Addendum)
Speech Language Pathology Weekly Progress and Session Note  Patient Details  Name: Raymond Kerr MRN: [id number] Date of Birth: Jul 09, 1955  Beginning of progress report period:  11/07/2020 End of progress report period:  11/14/2020  Today's Date: 11/14/2020 SLP Individual Time: 0800-0900 SLP Individual Time Calculation (min): 60 min  Short Term Goals: Week 3: SLP Short Term Goal 1 (Week 3): Patient will demonstrate efficient mastication and oral clearance of dysphagia 3 solids with modA verbal, visual and tactile cues. SLP Short Term Goal 1 - Progress (Week 3): Progressing toward goal SLP Short Term Goal 2 (Week 3): Patient will point to identify object pictures/photos in field of 4 when named or basic function/category described with 80% accuracy and modA tactile and verbal cues to initiate pointing. SLP Short Term Goal 2 - Progress (Week 3): Progressing toward goal SLP Short Term Goal 3 (Week 3): Patient will name basic level objects/object pictures/photos at word level with modA phrase completion cues, initial phoneme cues. SLP Short Term Goal 3 - Progress (Week 3): Met SLP Short Term Goal 4 (Week 3): Patient will initiate to perform gestures (pointing) and for performing basic functional tasks (toothbrushing, self-feeding) with min-modA verbal and tactile cues. SLP Short Term Goal 4 - Progress (Week 3): Not met SLP Short Term Goal 5 (Week 3): Patient will respond with yes/no verbal, gestural or yes/no communication board to basic/immediate needs questions with minA to initiate reponse. SLP Short Term Goal 5 - Progress (Week 3): Not met    New Short Term Goals: Week 4: SLP Short Term Goal 1 (Week 4): STG=LTG (anticipated discharge 10/1)  Weekly Progress Updates:  Patient met 1 of 5 STG's (object naming with modA cues) and is making progress towards two others. Unfortunately, he has exhibited some decline in performance this treatment week as compared to previous. He requires mod-maxA  verbal, tactile, visual, gestural cues to initiate tasks and modA verbal cues to initiate during verbal expression tasks. No spontaneous initiation of gestures, vocalizing, verbalizing have been observed. He is tolerating a Dys 3 solids, thin liquids diet with total A feeding and full supervision. Goals have been downgraded from minA to [place] due to slower than expected progress.   Intensity: Minumum of 1-2 x/day, 30 to 90 minutes Frequency: 3 to 5 out of 7 days Duration/Length of Stay: 10/1 Treatment/Interventions: Cognitive remediation/compensation;Dysphagia/aspiration precaution training;Internal/external aids;Speech/Language facilitation;Therapeutic Activities;Cueing hierarchy;Functional tasks;Multimodal communication approach;Patient/family education;Therapeutic Exercise   Daily Session  Skilled Therapeutic Interventions:   Patient seen for skilled ST session focusing on aphasia and dysphagia goals. Patient in bed with NT feeding him breakfast. SLP provided maxA verbal, visual, tactile cues for patient to hold fork with piece of food on it. He was able to then bring food into mouth one time, however all other times, he would start to put fork back on plate and would resist when SLP attempted hand over hand cues to bring food to mouth. At one point he seemed to say "finished" but SLP unable to get a consistent response when patient questioned further. He completed oral care with setup A and modA verbal, visual cues. He pointed to object pictures in field of 2 with 90% accuracy and field of 4 with 75% accuracy when they were named. He was less than 40% accurate for pointing to action/verb photos in field of two. Patient left in bed with all needs in place. He continues to benefit from skilled SLP intervention to maximize cognitive-linguistic, speech, and swallow function prior to discharge.  General    Pain Pain Assessment Pain Scale: Faces Faces Pain Scale: No hurt  Therapy/Group:  Individual Therapy  Sonia Baller, MA, CCC-SLP Speech Therapy

## 2020-11-14 NOTE — Progress Notes (Signed)
Pt unable to void overnight; I&O cath required; Urine output was and amber in color.

## 2020-11-14 NOTE — Progress Notes (Signed)
Occupational Therapy Session Note  Patient Details  Name: Raymond Kerr MRN: [id number] Date of Birth: April 23, 1955  Today's Date: 11/14/2020 OT Individual Time: 1015-1130 OT Individual Time Calculation (min): 75 min    Short Term Goals: Week 3:  OT Short Term Goal 1 (Week 3): Pt will don tshirt with mod A. OT Short Term Goal 2 (Week 3): Pt will demonstrate improved sit balance to sit EOB with min A while bathing UB with min a. OT Short Term Goal 3 (Week 3): Pt will scan plate and initiate self feeding using left hand with supervision. OT Short Term Goal 4 (Week 3): Pt will bathe UB with min assist.  Skilled Therapeutic Interventions/Progress Updates:    Pt sitting up in TIS w/c, flat affect, speaking softly but no intelligible words.  Pt transported to sink. Pt brushed teeth with mod assist to facilitate forward trunk lean in order to spit.  Pt then doffed shirt with mod assist, increased time to process, and step by step multimodal cues. Max assist needed to wash UB with pt perseverating on washing face and RUE despite cues provided to move to next step.  Donned shirt with max assist with pt showing strong desire to thread LUE first despite cues to thread over affected arm initially.  Transported to ortho gym and participated in standing task at standing frame to promote weight bearing and proprioceptive feedback through affected RLE and RUE (on table). Pt needing max assist +2 during sit<>stand to reduce strong right push.  Pt also needing significant manual and tactile cues to promote forward trunk flexion to reach cones and place reaching cross body.  Pt stood in standing frame 2 intervals of 8 minutes each.  Returned to room, tilted slightly back, RUE supported with pillow, call bell in reach, seat alarm on.  Therapy Documentation Precautions:  Precautions Precautions: Fall Precaution Comments: R hemi, global aphasia Restrictions Weight Bearing Restrictions: No    Therapy/Group:  Individual Therapy  Amie Critchley 11/14/2020, 1:25 PM

## 2020-11-14 NOTE — Progress Notes (Signed)
Pt has not had BM in over 72hours; pt has been given PRN senna but may need something in addition to encourage BM.

## 2020-11-14 NOTE — Progress Notes (Signed)
PROGRESS NOTE   Subjective/Complaints: Raymond Kerr voices no complaints this morning Alert and responsive, but speech unintelligible Liver ultrasound ordered given elevated LFTs- will monitor LFTs again tomorrow.    ROS: Limited due to cognitive/behavioral    Objective:   No results found. Recent Labs    11/14/20 0525  WBC 6.5  HGB 14.1  HCT 42.5  PLT 311     Recent Labs    11/13/20 1125  NA 136  K 4.5  CL 104  CO2 19*  GLUCOSE 200*  BUN 20  CREATININE 1.25*  CALCIUM 9.6      Intake/Output Summary (Last 24 hours) at 11/14/2020 1152 Last data filed at 11/14/2020 0700 Gross per 24 hour  Intake 357 ml  Output 400 ml  Net -43 ml        Physical Exam: Vital Signs Blood pressure (!) 149/94, pulse 90, temperature 98.9 F (37.2 C), temperature source Oral, resp. rate 16, height 6' (1.829 m), weight 109.9 kg, SpO2 100 %. Gen: no distress, normal appearing HEENT: oral mucosa pink and moist, NCAT Cardio: Reg rate Chest: normal effort, normal rate of breathing Abd: soft, non-distended Ext: no edema Psych: flat, lethargic Skin: warm, intact Musc: No edema in extremities.  No tenderness in extremities. Neuro: Alert, right central 7. Has a hard time keeping eyes open. Leans to right.  Global aphasia Makes eye contact, engages temporarily, not following commands No resting tone  Assessment/Plan: 1. Functional deficits which require 3+ hours per day of interdisciplinary therapy in a comprehensive inpatient rehab setting. Physiatrist is providing close team supervision and 24 hour management of active medical problems listed below. Physiatrist and rehab team continue to assess barriers to discharge/monitor patient progress toward functional and medical goals  Care Tool:  Bathing    Body parts bathed by patient: Face, Right arm, Chest, Abdomen, Front perineal area   Body parts bathed by helper: Buttocks,  Right upper leg, Left upper leg, Left lower leg, Right lower leg, Left arm     Bathing assist Assist Level: Maximal Assistance - Patient 24 - 49%     Upper Body Dressing/Undressing Upper body dressing   What is the patient wearing?: Pull over shirt    Upper body assist Assist Level: Moderate Assistance - Patient 50 - 74%    Lower Body Dressing/Undressing Lower body dressing      What is the patient wearing?: Pants, Incontinence brief     Lower body assist Assist for lower body dressing: Total Assistance - Patient < 25% (bed level)     Toileting Toileting    Toileting assist Assist for toileting: 2 Helpers     Transfers Chair/bed transfer  Transfers assist  Chair/bed transfer activity did not occur: Safety/medical concerns  Chair/bed transfer assist level: 2 Helpers (Slide board)     Locomotion Ambulation   Ambulation assist   Ambulation activity did not occur: Safety/medical concerns          Walk 10 feet activity   Assist  Walk 10 feet activity did not occur: Safety/medical concerns        Walk 50 feet activity   Assist Walk 50 feet with 2 turns activity  did not occur: Safety/medical concerns         Walk 150 feet activity   Assist Walk 150 feet activity did not occur: Safety/medical concerns         Walk 10 feet on uneven surface  activity   Assist Walk 10 feet on uneven surfaces activity did not occur: Safety/medical concerns         Wheelchair     Assist Is the patient using a wheelchair?:  (yes, per PT note) Type of Wheelchair:  (manual per PT note) Wheelchair activity did not occur: Safety/medical concerns         Wheelchair 50 feet with 2 turns activity    Assist    Wheelchair 50 feet with 2 turns activity did not occur: Safety/medical concerns       Wheelchair 150 feet activity     Assist  Wheelchair 150 feet activity did not occur: Safety/medical concerns       Blood pressure (!) 149/94,  pulse 90, temperature 98.9 F (37.2 C), temperature source Oral, resp. rate 16, height 6' (1.829 m), weight 109.9 kg, SpO2 100 %.    Medical Problem List and Plan: 1.  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 2.  Impaired mobility -DVT/anticoagulation:  Pharmaceutical: Continue Lovenox             -antiplatelet therapy: Aspirin 81 mg daily and Plavix 75 mg daily x90 days then aspirin alone 3. Pain Management: Tylenol as needed 4. Mood: Provide emotional support             -antipsychotic agents: N/A 5. Neuropsych: This patient is not capable of making decisions on his own behalf. 6. Skin/Wound Care: Routine skin checks 7. Fluids/Electrolytes/Nutrition: Routine in and outs 8.  Post stroke dysphagia.  Mechanical soft thin liquids..  Patient limited p.o. intake.   Discontinue Megace since eating better D/c remeron given worsening morning lethargy. Will try to determine what foods he likes- he does have an appetite but does not like the foods we are offering/D3 diet.   9.  Hyperlipidemia. Continue Lipitor. LDL reviewed- 124.  10. Uncontrolled Diabetes mellitus with hyperglycemia.  Hemoglobin A1c 10.1.  CBGs 143-171: Semglee 10 units daily.  Diabetic teaching. Maintain metformin to 500mg  BID- will not increase further given nausea with higher dose. Monitor creatinine. Discontinue ISS.  Recent Labs    11/13/20 2049 11/14/20 0609 11/14/20 1132  GLUCAP 207* 151* 193*   Elevated to 219: will discontinue Megace since eating better.   Restarted amaryl.  11.  Obesity.  BMI 32.86.  Dietary follow-up. Discontinue feeding supplement- try to promote regular foods. 12. Sub-optimal potassium: start 11/16/20 daily supplement. Continue supplement.  13. Right arm and leg spasticity:     -continue splinting, ROM  -maintain off baclofen given lethargy and much improved tone 14. Lethargy: d/c Baclofen and increase Ritalin to 10mg  BID 15. Transaminitis: d/c tylenol and monitor LFTs weekly. Still rising, discussed with pharmacy and statin may contribute weakly, will maintain for now given stroke risk and repeat next week. Liver ultrasound ordered 16. Impaired initiation: good response to Ritalin-increase Ritalin to 10mg  BID.   -dc baclofen 17.  Essential hypertension  Norvasc 2.5 started on 9/18---increase to 5mg  9/19, continue as systolic elevated to 140 on 9/20.  18.  Disposition: d/c 10/1 (29 days total). F/u with me on 10/21. Reaching out to family to let them know he will need 2 person support as well as Nurse, adult. Should initiate family training as soon as possible   LOS: 21 days A FACE TO FACE EVALUATION WAS PERFORMED  Raymond Kerr 11/14/2020, 11:52 AM

## 2020-11-15 LAB — COMPREHENSIVE METABOLIC PANEL
ALT: 136 U/L — ABNORMAL HIGH (ref 0–44)
AST: 116 U/L — ABNORMAL HIGH (ref 15–41)
Albumin: 3 g/dL — ABNORMAL LOW (ref 3.5–5.0)
Alkaline Phosphatase: 64 U/L (ref 38–126)
Anion gap: 8 (ref 5–15)
BUN: 13 mg/dL (ref 8–23)
CO2: 22 mmol/L (ref 22–32)
Calcium: 9.6 mg/dL (ref 8.9–10.3)
Chloride: 106 mmol/L (ref 98–111)
Creatinine, Ser: 1.15 mg/dL (ref 0.61–1.24)
GFR, Estimated: >60 mL/min (ref >60)
Glucose, Bld: 81 mg/dL (ref 70–99)
Potassium: 3.8 mmol/L (ref 3.5–5.1)
Sodium: 136 mmol/L (ref 135–145)
Total Bilirubin: 0.7 mg/dL (ref 0.3–1.2)
Total Protein: 7.2 g/dL (ref 6.5–8.1)

## 2020-11-15 LAB — GLUCOSE, CAPILLARY
Glucose-Capillary: 74 mg/dL (ref 70–99)
Glucose-Capillary: 97 mg/dL (ref 70–99)
Glucose-Capillary: 122 mg/dL — ABNORMAL HIGH (ref 70–99)
Glucose-Capillary: 76 mg/dL (ref 70–99)

## 2020-11-15 MED ORDER — SORBITOL 70 % SOLN
30.0000 mL | Freq: Every day | Status: DC | PRN
  Administered 2020-11-15: 30 mL via ORAL
  Filled 2020-11-15: qty 30

## 2020-11-15 MED ORDER — POLYETHYLENE GLYCOL 3350 17 G PO PACK
17.0000 g | PACK | Freq: Two times a day (BID) | ORAL | Status: DC
  Administered 2020-11-15 – 2020-11-24 (×19): 17 g via ORAL
  Filled 2020-11-15 (×21): qty 1

## 2020-11-15 NOTE — Progress Notes (Signed)
Speech Language Pathology Daily Session Note  Patient Details  Name: Raymond Kerr MRN: [id number] Date of Birth: [birth year]  Today's Date: 11/15/2020 SLP Individual Time: 8563-1497 SLP Individual Time Calculation (min): 43 min  Short Term Goals: Week 4: SLP Short Term Goal 1 (Week 4): STG=LTG (anticipated discharge 10/1)  Skilled Therapeutic Interventions: Pt seen for skilled ST with focus on speech and dysphagia goals as well as family education with patient's daughter. Pt and daughter provided education on rehab stay, including inconsistent ability to communicate basic wants and needs and fluctuating alertness/attention. Demonstrated multimodal communication methods to daughter including combination of yes/no verbalizations, gestures and yes/no communication board to increase understanding of wants/needs of patient. Pt continues to attempt verbalizations during structured language tasks, mostly unintelligible except for "OK". Pt consuming Dys 3 snack with thin liquids to educate daughter on effective compensatory strategies during intake (small bites/sips, upright 90 degrees, slow rate, alternate solids and liquids) and importantly to check for oral residue/pocketing after PO. Pt demonstrating mild oral stasis after initial swallow, cued for liquid wash which was effective in clearing. SLP providing daughter with handout for recommended foods and foods to avoid on a Dys 3 diet. Daughter with no speech related questions, not much input or engagement throughout. Pt and daughter will benefit from ongoing education and training to increase effective communication and safety with PO intake. Pt left in wheelchair with belt alarm on and daughter present. Cont ST POC.   Pain Pain Assessment Pain Scale: Faces Faces Pain Scale: No hurt  Therapy/Group: Individual Therapy  Tacey Ruiz 11/15/2020, 3:05 PM

## 2020-11-15 NOTE — Progress Notes (Signed)
Physical Therapy Session Note  Patient Details  Name: Raymond Kerr MRN: [id number] Date of Birth: 1955-12-06  Today's Date: 11/15/2020 PT Individual Time: 8979-1504 PT Individual Time Calculation (min): 43 min   Short Term Goals:  Week 3:  PT Short Term Goal 1 (Week 3): Pt will perform bed mobility with max assist of 1. PT Short Term Goal 2 (Week 3): Pt will complete bed<>chair transfers with maxA of 1 person PT Short Term Goal 3 (Week 3): Pt will maintain static standing for 2 minutes or greater with maxA of 1 person PT Short Term Goal 4 (Week 3): Pt will participate in family training to prepare for discharge   Skilled Therapeutic Interventions/Progress Updates:   Pt received supine in bed and agreeable to PT. Pt scheduled for family education, but family not present throughout session. Rolling R and L with max assist to the L and total A to the R to don pants. Supine>sit with. Total A for BLE and trunak control from the R side. Sitting balance with supervision assist x 5 minutes while setting up SB transfer with intermittent use of bed rail for balance. Slide board transfer to  [facility] with total A +2 for safety. Pt able initiate lateral scooted 25% of transfers on this day. Pt transported to rail in hall. Sit<>stand with max assist at rail. Gait training with Rail x 34ft with max assist-total A for RLE management for advancement and knee blocked into standing. Seated NME hip extension x 10 BLE and shoulder press within available with AAROM on the R side. Pt left sitting in WC with OT treatment.         Therapy Documentation Precautions:  Precautions Precautions: Fall Precaution Comments: R hemi, global aphasia Restrictions Weight Bearing Restrictions: No    Vital Signs: Therapy Vitals Temp: 98.5 F (36.9 C) Temp Source: Oral Pulse Rate: (!) 105 Resp: 18 BP: 136/85 Patient Position (if appropriate): Sitting Oxygen Therapy SpO2: 100 % O2 Device: Room Air Pain: Faces:  none   Therapy/Group: Individual Therapy  Golden Pop 11/15/2020, 2:28 PM

## 2020-11-15 NOTE — Progress Notes (Signed)
Occupational Therapy Session Note  Patient Details  Name: Raymond Kerr MRN: [id number] Date of Birth: 1955/12/05  Today's Date: 11/15/2020 OT Individual Time: 1345-1430 OT Individual Time Calculation (min): 45 min    Short Term Goals: Week 3:  OT Short Term Goal 1 (Week 3): Pt will don tshirt with mod A. OT Short Term Goal 2 (Week 3): Pt will demonstrate improved sit balance to sit EOB with min A while bathing UB with min a. OT Short Term Goal 3 (Week 3): Pt will scan plate and initiate self feeding using left hand with supervision. OT Short Term Goal 4 (Week 3): Pt will bathe UB with min assist.  Skilled Therapeutic Interventions/Progress Updates:    Pt sitting up in TIS w/c, attempting to communicate verbally however unintelligible verbiage and very soft spoken despite cues given. Pt shaking head no to changing clothes or washing up however he seemed more receptive to brushing teeth therefore transported to sink via w/c and setup with oral care items.  He required step by step multimodal cues to retrieve toothbrush and toothpaste and upon attempting to place toothpaste on brush, pt undershot and needed hand over hand to accurately place.  Pt also needing mod assist to facilitate forward bend in order to spit in sink.   Daughter arrived while pt brushing his teeth.  Discussed and collaborated with daughter in presence of pt regarding her home setup (first floor apartment with 3 STE, 2 bedrooms and one small bathroom per daughter).  Daughter reports she has a 52 year old son that lives with her.  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  She has a sister nearby but she works third shift full time and sleeps during the day, so caretaker responsibilities primarily fall on her alone.  Reviewed with daughter, pts current functional self care and transfer status and tentative recommendations of hoyer lift transfers and bed level ADLs.  Pt's  daughter quiet during session, but ultimately agreeable to further caregiver training next week Thursday,Friday, Saturday.  Direct hand off to speech therapy.    Therapy Documentation Precautions:  Precautions Precautions: Fall Precaution Comments: R hemi, global aphasia Restrictions Weight Bearing Restrictions: No    Therapy/Group: Individual Therapy  Amie Critchley 11/15/2020, 4:05 PM

## 2020-11-16 LAB — GLUCOSE, CAPILLARY
Glucose-Capillary: 79 mg/dL (ref 70–99)
Glucose-Capillary: 84 mg/dL (ref 70–99)
Glucose-Capillary: 84 mg/dL (ref 70–99)
Glucose-Capillary: 111 mg/dL — ABNORMAL HIGH (ref 70–99)

## 2020-11-16 MED ORDER — BISACODYL 10 MG RE SUPP
10.0000 mg | Freq: Every day | RECTAL | Status: DC | PRN
  Filled 2020-11-16: qty 1

## 2020-11-16 MED ORDER — MEGESTROL ACETATE 400 MG/10ML PO SUSP
400.0000 mg | Freq: Two times a day (BID) | ORAL | Status: DC
  Administered 2020-11-16 – 2020-11-25 (×19): 400 mg via ORAL
  Filled 2020-11-16 (×22): qty 10

## 2020-11-16 NOTE — Progress Notes (Signed)
Speech Language Pathology Daily Session Note  Patient Details  Name: Raymond Kerr MRN: [id number] Date of Birth: [birth year]  Today's Date: 11/16/2020 SLP Individual Time: 1125-1155 SLP Individual Time Calculation (min): 30 min  Short Term Goals: Week 4: SLP Short Term Goal 1 (Week 4): STG=LTG (anticipated discharge 10/1)  Skilled Therapeutic Interventions: Pt seen for skilled ST with focus on speech and swallowing goals. SLP facilitating oral care by providing min-mod A cues for thoroughness. Lips dry, SLP applying balm and providing water to increase hydration (EMR states pt with poor intake of both liquids and solids). Pt seeming to enjoy bottled water vs tap water (tap provided in previous tx session with pt making a displeased face with most sips). SLP facilitating simple yes/no questions related to wants and needs by encouraging multimodal communication strategies including communication board, gestures and verbalizations. Pt responding on 4/10 opportunities with max A and extra time. Pt left in bed with alarm set and all needs within reach. Cont ST POC.   Pain Pain Assessment Pain Scale: 0-10 Pain Score: 0-No pain  Therapy/Group: Individual Therapy  Tacey Ruiz 11/16/2020, 11:51 AM

## 2020-11-16 NOTE — Progress Notes (Signed)
PROGRESS NOTE   Subjective/Complaints: Not voiding  , poor fluid and caloric intake, no BM x several days, RN requ dulc supp    ROS: Limited due to cognitive/behavioral    Objective:   US Abdomen Limited RUQ (LIVER/GB)  Result Date: 11/14/2020 CLINICAL DATA:  Elevated LFTs EXAM: ULTRASOUND ABDOMEN LIMITED RIGHT UPPER QUADRANT COMPARISON:  None. FINDINGS: Gallbladder: No gallstones or wall thickening visualized. No sonographic Murphy sign noted by sonographer. Common bile duct: Diameter: 4 mm Liver: No focal lesion identified. Heterogeneous liver echotexture with nodular surface contour. Portal vein is patent on color Doppler imaging with normal direction of blood flow towards the liver. Other: None. Technical note: Examination is limited secondary to poor penetration related to patient body habitus. IMPRESSION: 1. Heterogeneous liver echotexture with nodular surface contour. Sonographic appearance suggestive of cirrhosis. No focal liver lesion is identified. 2. Unremarkable gallbladder. Electronically Signed   By: Duanne Guess D.O.   On: 11/14/2020 17:52   Recent Labs    11/14/20 0525  WBC 6.5  HGB 14.1  HCT 42.5  PLT 311      Recent Labs    11/13/20 1125 11/15/20 0637  NA 136 136  K 4.5 3.8  CL 104 106  CO2 19* 22  GLUCOSE 200* 81  BUN 20 13  CREATININE 1.25* 1.15  CALCIUM 9.6 9.6       Intake/Output Summary (Last 24 hours) at 11/16/2020 0935 Last data filed at 11/15/2020 1818 Gross per 24 hour  Intake 200 ml  Output 300 ml  Net -100 ml         Physical Exam: Vital Signs Blood pressure 134/90, pulse 91, temperature 98.6 F (37 C), resp. rate 19, height 6' (1.829 m), weight 109.9 kg, SpO2 100 %.  General: No acute distress Mood and affect are appropriate Heart: Regular rate and rhythm no rubs murmurs or extra sounds Lungs: Clear to auscultation, breathing unlabored, no rales or wheezes Abdomen:  Positive bowel sounds, soft nontender to palpation, nondistended Extremities: No clubbing, cyanosis, or edema Skin: No evidence of breakdown, no evidence of rash   Neuro: Alert, right central 7. Has a hard time keeping eyes open. Leans to right.  Global aphasia Makes eye contact, engages temporarily, not following commands No resting tone  Assessment/Plan: 1. Functional deficits which require 3+ hours per day of interdisciplinary therapy in a comprehensive inpatient rehab setting. Physiatrist is providing close team supervision and 24 hour management of active medical problems listed below. Physiatrist and rehab team continue to assess barriers to discharge/monitor patient progress toward functional and medical goals  Care Tool:  Bathing    Body parts bathed by patient: Face, Right arm, Chest, Abdomen, Front perineal area   Body parts bathed by helper: Buttocks, Right upper leg, Left upper leg, Left lower leg, Right lower leg, Left arm     Bathing assist Assist Level: Maximal Assistance - Patient 24 - 49%     Upper Body Dressing/Undressing Upper body dressing   What is the patient wearing?: Pull over shirt    Upper body assist Assist Level: Moderate Assistance - Patient 50 - 74%    Lower Body Dressing/Undressing Lower body dressing  What is the patient wearing?: Pants, Incontinence brief     Lower body assist Assist for lower body dressing: Total Assistance - Patient < 25% (bed level)     Toileting Toileting    Toileting assist Assist for toileting: 2 Helpers     Transfers Chair/bed transfer  Transfers assist  Chair/bed transfer activity did not occur: Safety/medical concerns  Chair/bed transfer assist level: 2 Helpers Building services engineer)     Locomotion Ambulation   Ambulation assist   Ambulation activity did not occur: Safety/medical concerns          Walk 10 feet activity   Assist  Walk 10 feet activity did not occur: Safety/medical  concerns        Walk 50 feet activity   Assist Walk 50 feet with 2 turns activity did not occur: Safety/medical concerns         Walk 150 feet activity   Assist Walk 150 feet activity did not occur: Safety/medical concerns         Walk 10 feet on uneven surface  activity   Assist Walk 10 feet on uneven surfaces activity did not occur: Safety/medical concerns         Wheelchair     Assist Is the patient using a wheelchair?:  (yes, per PT note) Type of Wheelchair:  (manual per PT note) Wheelchair activity did not occur: Safety/medical concerns         Wheelchair 50 feet with 2 turns activity    Assist    Wheelchair 50 feet with 2 turns activity did not occur: Safety/medical concerns       Wheelchair 150 feet activity     Assist  Wheelchair 150 feet activity did not occur: Safety/medical concerns       Blood pressure 134/90, pulse 91, temperature 98.6 F (37 C), resp. rate 19, height 6' (1.829 m), weight 109.9 kg, SpO2 100 %.    Medical Problem List and Plan: 1.  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 2.  Impaired mobility -DVT/anticoagulation:  Pharmaceutical: Continue Lovenox             -antiplatelet therapy: Aspirin 81 mg daily and Plavix 75 mg daily x90 days then aspirin alone 3. Pain Management: Tylenol as needed 4. Mood: Provide emotional support             -antipsychotic agents: N/A 5. Neuropsych: This patient is not capable of making decisions on his own behalf. 6. Skin/Wound Care: Routine skin checks 7. Fluids/Electrolytes/Nutrition: Routine in and outs, nsg to push fluids, hopefully megace will help as well  8.  Post stroke dysphagia.  Mechanical soft thin liquids..  Patient limited p.o. intake.    Discontinue Megace since eating better Eating is poor resume Megace 9/25  9.  Hyperlipidemia. Continue Lipitor. LDL reviewed- 124.  10. Uncontrolled Diabetes mellitus with hyperglycemia.  Hemoglobin A1c 10.1.  CBGs 143-171: Semglee 10 units daily.  Diabetic teaching. Maintain metformin to 500mg  BID- will not increase further given nausea with higher dose. Monitor creatinine. Discontinue ISS.  Recent Labs    11/15/20 1623 11/15/20 2110 11/16/20 0604  GLUCAP 122* 76 79    Elevated to 219: will discontinue Megace since eating better.   Hold  amaryl resume in am if appetite improved .  11.  Obesity.  BMI 32.86.  Dietary follow-up. Discontinue feeding supplement- try to promote regular foods. 12. Sub-optimal potassium: start daily supplement. Continue supplement.  13. Right arm and leg spasticity:    -continue splinting, ROM  -maintain off baclofen given lethargy and much improved tone 14. Lethargy: d/c Baclofen and increase Ritalin to 10mg  BID 15. Transaminitis: d/c tylenol and monitor LFTs weekly. Still rising, discussed with pharmacy and statin may contribute weakly, will maintain for now given stroke risk and repeat next week. Liver ultrasound ordered 16. Impaired initiation: good response to Ritalin-increase Ritalin to 10mg  BID.   -dc baclofen 17.  Essential hypertension  Norvasc 2.5 started on 9/18---increase to 5mg  9/19, continue as systolic elevated to 140 on 9/20.  Vitals:   11/15/20 1934 11/16/20 0603  BP: 130/89 134/90  Pulse: 100 91  Resp: 18 19  Temp: 99.2 F (37.3 C) 98.6 F (37 C)  SpO2: 100% 100%   Controlled 9/25 18. Disposition: d/c 10/1 (29 days total). F/u with me on 10/21. Reaching out to family to let them know he will need 2 person support as well as 11/18/20. Should initiate family training as soon as possible  62.  COnstipation add dulc supp today  LOS: 23 days A FACE TO FACE EVALUATION WAS PERFORMED  11/21 11/16/2020, 9:35 AM

## 2020-11-16 NOTE — Progress Notes (Signed)
Occupational Therapy Session Note  Patient Details  Name: Raymond Kerr MRN: [id number] Date of Birth: [birth year]  Today's Date: 11/17/2020 OT Individual Time: 0370-4888 OT Individual Time Calculation (min): 25 min   Skilled Therapeutic Interventions/Progress Updates:    Pt greeted in the TIS, audibly snoring. Played meaningful music and frequently provided sternal rub to increase alertness. Pt with minimal eye opening throughout session though did make eye contact with therapist (sitting on his Rt side) a few times. Gentle stretching and PROM completed to the Rt UE for tone management. Hand over hand for pt to perform self ROM for digits, thumb, wrist, and forearm supinators. Prolonged stretching in shoulder external rotation, elbow extension, and forearm supination due to hypertonicity. Pt remained in the TIS at close of session with degree of tilt changed for pressure relief. Touch call bell on lap and safety belt fastened.   Therapy Documentation Precautions:  Precautions Precautions: Fall Precaution Comments: R hemi, global aphasia Restrictions Weight Bearing Restrictions: No  Pain: no s/s pain during tx, difficult to inquire due to pt nonverbal and globally aphasic Pain Assessment Pain Scale: 0-10 Pain Score: 0-No pain ADL: ADL Upper Body Bathing: Moderate assistance Where Assessed-Upper Body Bathing: Bed level Lower Body Bathing: Dependent Where Assessed-Lower Body Bathing: Bed level Upper Body Dressing: Maximal assistance Where Assessed-Upper Body Dressing: Edge of bed Lower Body Dressing: Dependent Where Assessed-Lower Body Dressing: Bed level      :     Therapy/Group: Individual Therapy  Paxson Harrower A Araceli Coufal 11/17/2020, 12:25 PM

## 2020-11-16 NOTE — Plan of Care (Signed)
  Problem: Consults Goal: RH STROKE PATIENT EDUCATION Description: See Patient Education module for education specifics  Outcome: Progressing   Problem: RH BOWEL ELIMINATION Goal: RH STG MANAGE BOWEL WITH ASSISTANCE Description: STG Manage Bowel with mod Assistance. Outcome: Progressing Goal: RH STG MANAGE BOWEL W/MEDICATION W/ASSISTANCE Description: STG Manage Bowel with Medication with mod Assistance. Outcome: Progressing   Problem: RH SKIN INTEGRITY Goal: RH STG SKIN FREE OF INFECTION/BREAKDOWN Description: With max assist Outcome: Progressing Goal: RH STG MAINTAIN SKIN INTEGRITY WITH ASSISTANCE Description: STG Maintain Skin Integrity With max Assistance. Outcome: Progressing Goal: RH STG ABLE TO PERFORM INCISION/WOUND CARE W/ASSISTANCE Description: STG Able To Perform Incision/Wound Care With total Assistance. Outcome: Progressing   Problem: RH SAFETY Goal: RH STG ADHERE TO SAFETY PRECAUTIONS W/ASSISTANCE/DEVICE Description: STG Adhere to Safety Precautions With cues Assistance/Device. Outcome: Progressing   Problem: RH KNOWLEDGE DEFICIT Goal: RH STG INCREASE KNOWLEDGE OF DIABETES Description: Patient will be able to manage DM with medications and dietary modifications using handouts and educational resources w cues/reminders Outcome: Progressing Goal: RH STG INCREASE KNOWLEDGE OF HYPERTENSION Description: Patient will be able to manage HTN with medications and dietary modifications using handouts and educational resources w cues/reminders  Outcome: Progressing Goal: RH STG INCREASE KNOWLEDGE OF DYSPHAGIA/FLUID INTAKE Description: Patient will be able to manage Dysphagia, medications management and dietary modifications using handouts and educational resources w cues Outcome: Progressing Goal: RH STG INCREASE KNOWLEGDE OF HYPERLIPIDEMIA Description: Patient will be able to manage HLD with medications and dietary modifications using handouts and educational resources  independently Outcome: Progressing Goal: RH STG INCREASE KNOWLEDGE OF STROKE PROPHYLAXIS Description: Patient will be able to manage secondary stroke risks with medications and dietary modifications using handouts and educational resources independently Outcome: Progressing   Problem: RH BLADDER ELIMINATION Goal: RH STG MANAGE BLADDER WITH ASSISTANCE Description: STG Manage Bladder With total Assistance Outcome: Progressing Goal: RH STG MANAGE BLADDER WITH EQUIPMENT WITH ASSISTANCE Description: STG Manage Bladder With Equipment With total Assistance Outcome: Progressing

## 2020-11-17 LAB — GLUCOSE, CAPILLARY
Glucose-Capillary: 136 mg/dL — ABNORMAL HIGH (ref 70–99)
Glucose-Capillary: 177 mg/dL — ABNORMAL HIGH (ref 70–99)
Glucose-Capillary: 148 mg/dL — ABNORMAL HIGH (ref 70–99)

## 2020-11-17 MED ORDER — ENOXAPARIN SODIUM 60 MG/0.6ML IJ SOSY
55.0000 mg | PREFILLED_SYRINGE | INTRAMUSCULAR | Status: DC
  Administered 2020-11-18 – 2020-11-25 (×8): 55 mg via SUBCUTANEOUS
  Filled 2020-11-17 (×10): qty 0.55
  Filled 2020-11-17: qty 0.6
  Filled 2020-11-17 (×4): qty 0.55

## 2020-11-17 MED ORDER — AMLODIPINE BESYLATE 10 MG PO TABS
10.0000 mg | ORAL_TABLET | Freq: Every day | ORAL | Status: DC
  Administered 2020-11-18 – 2020-11-25 (×8): 10 mg via ORAL
  Filled 2020-11-17 (×9): qty 1

## 2020-11-17 NOTE — Progress Notes (Signed)
Speech Language Pathology Daily Session Note  Patient Details  Name: Raymond Kerr MRN: [id number] Date of Birth: 12-11-1955  Today's Date: 11/17/2020 SLP Individual Time: 4098-1191 SLP Individual Time Calculation (min): 42 min  Short Term Goals: Week 4: SLP Short Term Goal 1 (Week 4): STG=LTG (anticipated discharge 10/1)  Skilled Therapeutic Interventions: Skilled ST services focused on language and swallow skills. SLP and NT completed brief change in bed, Pt required total-Max A for bed mobility. SLP facilitated PO consumption of breakfast food. Pt was able to self-feed drink once placed in hand with max A verbal cues, however pt required total A for self-feeding solids. Pt demonstrated extended mastication and bolus preparation with dys 3 textures, although demonstrated ability to clear oral cavity eventually. Pt demonstrated response to yes/no questions for food preference (nodding head, turning head away, attempt at vocalizations and non-response in 40% opportunities) responding with head no "yes" to 2 out 20 request and required mod A multimodal cues to clarify response. Pt demonstrated ability to manipulate purred textures with ease. Pt was left in room with call bell within reach and bed alarm set. SLP communicated with NT to monitor mastication time and PO intake with lunch tray. Recommend to continue ST services.  As of note NT reported limited PO intake during lunch due to appetite,  but mastication time and oral clearance did not appear to impede PO intake. SLP will continue to assess appropriateness of dys 3 texture diet.      Pain Pain Assessment Pain Score: 0-No pain  Therapy/Group: Individual Therapy  Lourine Alberico  [facility] 11/17/2020, 6:03 PM

## 2020-11-17 NOTE — Progress Notes (Signed)
Physical Therapy Weekly Progress Note  Patient Details  Name: Raymond Kerr MRN: [id number] Date of Birth: May 29, 1955  Beginning of progress report period: November 10, 2020 End of progress report period: November 17, 2020  Today's Date: 11/17/2020 PT Individual Time: 0962-8366 PT Individual Time Calculation (min): 58 min   Patient has met 0 of 4 short term goals.  Patient is making slow and minimal progress toward his goals. He remains severely impaired in midline orientation with a strong presentation of pushing to the R. He presents with strong R LE flexor tone, R side inattention, decreased global functional strength, impaired transfers, limited wc mobility and is non-ambulatory.   Patient continues to demonstrate the following deficits muscle weakness, decreased cardiorespiratoy endurance, impaired timing and sequencing, abnormal tone, unbalanced muscle activation, motor apraxia, decreased coordination, and decreased motor planning, decreased visual perceptual skills, decreased motor planning, decreased initiation, decreased attention, decreased awareness, decreased problem solving, decreased safety awareness, decreased memory, and delayed processing, and decreased sitting balance, decreased standing balance, decreased postural control, hemiplegia, and decreased balance strategies and therefore will continue to benefit from skilled PT intervention to increase functional independence with mobility.  Patient  goals downgraded to reflect degree of progress in therapy .  Continue plan of care.  PT Short Term Goals Week 3:  PT Short Term Goal 1 (Week 3): Pt will perform bed mobility with max assist of 1. PT Short Term Goal 1 - Progress (Week 3): Progressing toward goal PT Short Term Goal 2 (Week 3): Pt will complete bed<>chair transfers with maxA of 1 person PT Short Term Goal 2 - Progress (Week 3): Progressing toward goal PT Short Term Goal 3 (Week 3): Pt will maintain static standing for 2  minutes or greater with maxA of 1 person PT Short Term Goal 3 - Progress (Week 3): Progressing toward goal PT Short Term Goal 4 (Week 3): Pt will participate in family training to prepare for discharge PT Short Term Goal 4 - Progress (Week 3): Progressing toward goal Week 4:  PT Short Term Goal 1 (Week 4): STG= LTG based on ELOS  Skilled Therapeutic Interventions/Progress Updates:  Patient received sitting up in TIS wc, agreeable to PT. He does not indicate that he is in pain at this time. He does indicate that he didn't like his lunch- he ate very little. NT reports encouraging increased PO intake, but patient declined. PT transporting patient in wc to therapy gym for time management and energy conservation. SW reporting that patient cannot receive TIS wc for home use due to insurance status. PT attempting to have patient transfer to standard wc to assess for safety. However, given degree of patients pushing to the R, R LE flexor tone, truncal rigidity, patient truly unsafe to complete squat pivot or slideboard at this time. PT attempting to have patient stand using Clarise Cruz lift. He required max multi modal cues and extended time to respond to any cues. Sit <> stand in [place] with mirror for visual feedback on midline orientation. Patient pushing with L UE/LE. R  LE flexor tone resulting in R foot sliding off of platform. Patient unsafe to transfer to standard chair using Clarise Cruz as well. Given patients poor postural awareness, he is unsafe to use a standard chair at home without significant risk of sliding out. As well, he lacks a safe transfer method to standard chair. Through a series of yes/no questions, patient reports minimal space in his daughters use for hoyer lift and even hospital bed. Patient likely not  safe to dc to dtrs house without these pieces of equipment. Patient returning to room in TIS wc, seatbelt alarm on, call light within reach.   Therapy Documentation Precautions:   Precautions Precautions: Fall Precaution Comments: R hemi, global aphasia Restrictions Weight Bearing Restrictions: No    Therapy/Group: Individual Therapy  Debbora Dus 11/17/2020, 7:49 AM

## 2020-11-17 NOTE — Progress Notes (Signed)
Occupational Therapy Session Note  Patient Details  Name: Raymond Kerr MRN: [id number] Date of Birth: 03/19/1955  Today's Date: 11/17/2020 OT Individual Time: 0905-1005 OT Individual Time Calculation (min): 60 min    Short Term Goals: Week 3:  OT Short Term Goal 1 (Week 3): Pt will don tshirt with mod A. OT Short Term Goal 2 (Week 3): Pt will demonstrate improved sit balance to sit EOB with min A while bathing UB with min a. OT Short Term Goal 3 (Week 3): Pt will scan plate and initiate self feeding using left hand with supervision. OT Short Term Goal 4 (Week 3): Pt will bathe UB with min assist.  Skilled Therapeutic Interventions/Progress Updates:    Pt semi reclined in bed, shaking head and stating "no" when asked if he has any pain. Pants donned with total assist +2 (second person required to assist with rolling left and right). Total dependence to donn ted hose and socks. Pt required mod assist +2 supine to sit.  Doffed gown and donned shirt overhead with max assist and max multimodal cues. While donning shirt, pt required intermittent multimodal cues and manual positioning to reduce right sided push however pt able to maintain balance with CGA.  Total assist +2 squat pivot to TIS w/c.  Pt transported to ortho gym via w/c and participated in functional reach and weight shifting task within standing frame needing max assist +2 during sit<>stand to reduce strong right push.  Pt also needing significant manual and tactile cues to promote forward trunk flexion to reach cones and place reaching cross body.  Pt stood in standing frame x 10 minutes.  Returned to room, tilted slightly back, RUE supported with pillow, call bell in reach, seat alarm on. Midline awareness impairment hindering progress.     Therapy Documentation Precautions:  Precautions Precautions: Fall Precaution Comments: R hemi, global aphasia Restrictions Weight Bearing Restrictions: No    Therapy/Group: Individual  Therapy  Amie Critchley 11/17/2020, 12:45 PM

## 2020-11-17 NOTE — Progress Notes (Signed)
Occupational Therapy Session Note  Patient Details  Name: Raymond Kerr MRN: [id number] Date of Birth: 23-Feb-1955  Today's Date: 11/17/2020 OT Individual Time: 1415-1445 OT Individual Time Calculation (min): 30 min    Short Term Goals: Week 3:  OT Short Term Goal 1 (Week 3): Pt will don tshirt with mod A. OT Short Term Goal 2 (Week 3): Pt will demonstrate improved sit balance to sit EOB with min A while bathing UB with min a. OT Short Term Goal 3 (Week 3): Pt will scan plate and initiate self feeding using left hand with supervision. OT Short Term Goal 4 (Week 3): Pt will bathe UB with min assist.  Skilled Therapeutic Interventions/Progress Updates:    Patient seated in w/c, alert and attentive to tasks.  Occ head nods but difficult to understand his wants and needs, he does not appear to be in pain, right side with significant flexor tightness.  Aide reports that he has been up since earlier this morning.  Completed sliding board transfer w/c to bed with max A of 2.  Completed unsupported sitting trunk mobility and right side facilitation of extensors/scapula, inhibition of flexors.  Sit to supine max A of 2.  Completed right arm PROM/stretch in supine position, he remained in bed at close of session, bed alarm set and call bell in hand.    Therapy Documentation Precautions:  Precautions Precautions: Fall Precaution Comments: R hemi, global aphasia Restrictions Weight Bearing Restrictions: No   Therapy/Group: Individual Therapy  Barrie Lyme 11/17/2020, 7:45 AM

## 2020-11-17 NOTE — Progress Notes (Addendum)
Patient ID: Raymond Kerr, male   DOB: 1956/02/06, 65 y.o.   MRN: [id number]  Message left for daughter-[name] regarding here on Sat to observe Dad in therapies. Trying to schedule time for this week for hands on training and if possible extending pt's stay. Await return call  3:00 PM Spoke with [name]-daughter to schedule family training for Th Fri And Sat. Thurs and Friday from 10-12 and 1-2 pm and Sat from 9-12. Aware this worker plans to ask for an extension for pt to see if can make more progress or be less care. Barbaraann Faster Ad Litem was here and answered his questions regarding pt's level of care and plan for discharge. Continue to work on a safe discharge plan.

## 2020-11-17 NOTE — Progress Notes (Signed)
PROGRESS NOTE   Subjective/Complaints: Denies pain Alert and  responsive today Working with SLP No concerns as per nursing   ROS: Denies pain   Objective:   No results found. No results for input(s): WBC, HGB, HCT, PLT in the last 72 hours.   Recent Labs    11/15/20 0637  NA 136  K 3.8  CL 106  CO2 22  GLUCOSE 81  BUN 13  CREATININE 1.15  CALCIUM 9.6      Intake/Output Summary (Last 24 hours) at 11/17/2020 1401 Last data filed at 11/17/2020 1100 Gross per 24 hour  Intake 240 ml  Output 650 ml  Net -410 ml        Physical Exam: Vital Signs Blood pressure 130/82, pulse (!) 108, temperature 98.4 F (36.9 C), temperature source Oral, resp. rate 18, height 6' (1.829 m), weight 109.9 kg, SpO2 100 %.  General: No acute distress Mood and affect are appropriate Heart: tachycardia Lungs: Clear to auscultation, breathing unlabored, no rales or wheezes Abdomen: Positive bowel sounds, soft nontender to palpation, nondistended Extremities: No clubbing, cyanosis, or edema Skin: No evidence of breakdown, no evidence of rash  Neuro: Alert, right central 7. Has a hard time keeping eyes open. Leans to right.  Global aphasia Makes eye contact, engages temporarily, not following commands No resting tone  Assessment/Plan: 1. Functional deficits which require 3+ hours per day of interdisciplinary therapy in a comprehensive inpatient rehab setting. Physiatrist is providing close team supervision and 24 hour management of active medical problems listed below. Physiatrist and rehab team continue to assess barriers to discharge/monitor patient progress toward functional and medical goals  Care Tool:  Bathing    Body parts bathed by patient: Face, Right arm, Chest, Abdomen, Front perineal area   Body parts bathed by helper: Buttocks, Right upper leg, Left upper leg, Left lower leg, Right lower leg, Left arm      Bathing assist Assist Level: Maximal Assistance - Patient 24 - 49%     Upper Body Dressing/Undressing Upper body dressing   What is the patient wearing?: Pull over shirt    Upper body assist Assist Level: Moderate Assistance - Patient 50 - 74%    Lower Body Dressing/Undressing Lower body dressing      What is the patient wearing?: Pants, Incontinence brief     Lower body assist Assist for lower body dressing: Total Assistance - Patient < 25% (bed level)     Toileting Toileting    Toileting assist Assist for toileting: 2 Helpers     Transfers Chair/bed transfer  Transfers assist  Chair/bed transfer activity did not occur: Safety/medical concerns  Chair/bed transfer assist level: 2 Helpers Building services engineer)     Locomotion Ambulation   Ambulation assist   Ambulation activity did not occur: Safety/medical concerns          Walk 10 feet activity   Assist  Walk 10 feet activity did not occur: Safety/medical concerns        Walk 50 feet activity   Assist Walk 50 feet with 2 turns activity did not occur: Safety/medical concerns         Walk 150 feet activity  Assist Walk 150 feet activity did not occur: Safety/medical concerns         Walk 10 feet on uneven surface  activity   Assist Walk 10 feet on uneven surfaces activity did not occur: Safety/medical concerns         Wheelchair     Assist Is the patient using a wheelchair?:  (yes, per PT note) Type of Wheelchair:  (manual per PT note) Wheelchair activity did not occur: Safety/medical concerns         Wheelchair 50 feet with 2 turns activity    Assist    Wheelchair 50 feet with 2 turns activity did not occur: Safety/medical concerns       Wheelchair 150 feet activity     Assist  Wheelchair 150 feet activity did not occur: Safety/medical concerns       Blood pressure 130/82, pulse (!) 108, temperature 98.4 F (36.9 C), temperature source Oral, resp. rate  18, height 6' (1.829 m), weight 109.9 kg, SpO2 100 %.    Medical Problem List and Plan: 1.  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 2.  Impaired mobility -DVT/anticoagulation:  Pharmaceutical: Continue Lovenox             -antiplatelet therapy: Aspirin 81 mg daily and Plavix 75 mg daily x90 days then aspirin alone 3. Pain Management: Tylenol as needed 4. Mood: Provide emotional support             -antipsychotic agents: N/A 5. Neuropsych: This patient is not capable of making decisions on his own behalf. 6. Skin/Wound Care: Routine skin checks 7. Fluids/Electrolytes/Nutrition: Routine in and outs, nsg to push fluids, hopefully megace will help as well  8.  Post stroke dysphagia.  Mechanical soft thin liquids..  Patient limited p.o. intake.   Discontinue Megace since eating better Eating is poor resume Megace 9/25  9.  Hyperlipidemia. Continue Lipitor. LDL reviewed- 124.  10. Uncontrolled Diabetes mellitus with hyperglycemia.  Hemoglobin A1c 10.1.  CBGs 143-171: Semglee 10 units daily.  Diabetic teaching. Maintain metformin to 500mg  BID- will not increase further given nausea with higher dose. Monitor creatinine. Discontinue ISS.  Recent Labs    11/16/20 2101 11/17/20 0621 11/17/20 1138  GLUCAP 111* 136* 177*   Elevated to 219: will discontinue Megace since eating better.   Hold  amaryl resume in am if appetite improved .  11.  Obesity.  BMI 32.86.  Dietary follow-up. Discontinue feeding supplement- try to promote regular foods. 12. Sub-optimal potassium: start 11/19/20 daily supplement. Continue supplement.  13. Right arm and leg spasticity:    -continue splinting, ROM  -maintain off baclofen given lethargy and much improved tone 14. Lethargy: d/c Baclofen and  increase Ritalin to 10mg  BID 15. Transaminitis: d/c tylenol and monitor LFTs weekly. Still rising, discussed with pharmacy and statin may contribute weakly, will maintain for now given stroke risk and repeat next week. Liver ultrasound ordered 16. Impaired initiation: good response to Ritalin-increase Ritalin to 10mg  BID.   -dc baclofen 17.  Essential hypertension: increase Norvasc to 10mg  Vitals:   11/17/20 0505 11/17/20 1349  BP: (!) 156/90 130/82  Pulse: (!) 101 (!) 108  Resp: 17 18  Temp: 98 F (36.7 C) 98.4 F (36.9 C)  SpO2: 100% 100%   18.  Constipation: continue suppository PRN and Miralax BID.  19. Tachycardia: EKG ordered,  19. Disposition: d/c 10/1 (29 days total). F/u with me on 10/21. Reaching out to family to let them know he will need 2 person support as well as Nurse, adult. Should initiate family training as soon as possible   LOS: 24 days A FACE TO FACE EVALUATION WAS PERFORMED  Lashundra Shiveley P [employer] [facility] 11/17/2020, 2:01 PM

## 2020-11-18 LAB — GLUCOSE, CAPILLARY
Glucose-Capillary: 104 mg/dL — ABNORMAL HIGH (ref 70–99)
Glucose-Capillary: 120 mg/dL — ABNORMAL HIGH (ref 70–99)
Glucose-Capillary: 123 mg/dL — ABNORMAL HIGH (ref 70–99)
Glucose-Capillary: 152 mg/dL — ABNORMAL HIGH (ref 70–99)
Glucose-Capillary: 162 mg/dL — ABNORMAL HIGH (ref 70–99)
Glucose-Capillary: 99 mg/dL (ref 70–99)

## 2020-11-18 MED ORDER — LIP MEDEX EX OINT
TOPICAL_OINTMENT | CUTANEOUS | Status: DC | PRN
  Filled 2020-11-18: qty 7

## 2020-11-18 MED ORDER — TAMSULOSIN HCL 0.4 MG PO CAPS
0.4000 mg | ORAL_CAPSULE | Freq: Every day | ORAL | Status: DC
  Administered 2020-11-18 – 2020-11-25 (×8): 0.4 mg via ORAL
  Filled 2020-11-18 (×8): qty 1

## 2020-11-18 NOTE — Progress Notes (Signed)
Occupational Therapy Session Note  Patient Details  Name: Raymond Kerr MRN: [id number] Date of Birth: [birth year]  Today's Date: 11/18/2020 OT Individual Time: 1330-1430 OT Individual Time Calculation (min): 60 min    Short Term Goals: Week 3:  OT Short Term Goal 1 (Week 3): Pt will don tshirt with mod A. OT Short Term Goal 2 (Week 3): Pt will demonstrate improved sit balance to sit EOB with min A while bathing UB with min a. OT Short Term Goal 3 (Week 3): Pt will scan plate and initiate self feeding using left hand with supervision. OT Short Term Goal 4 (Week 3): Pt will bathe UB with min assist.   Skilled Therapeutic Interventions/Progress Updates:    Pt sitting up in TIS w/c, attempting to verbalize needs however unintelligible vocals; therapist provided communication board however pt not pointing to need.  Pt shaking his head when therapist offered rest of meal tray.  Transported to sink via w/c and provided toothbrush and toothpaste within reach and visual field.  Pt grasping toothbrush but having difficulty problem solving through hemi technique to apply toothpaste needing assist to complete.  Pt brushed teeth with mod assist to facilitate forward trunk lean in order to spit.  Pt then doffed shirt with mod assist, increased time to process, and step by step multimodal cues. Max assist needed to wash UB with pt perseverating on washing face and RUE despite cues provided to move to next step.  Donned shirt with max assist with pt showing strong desire to thread LUE first despite cues to thread over affected arm initially.    Transported pt to outdoor [employer] to promote improved mood and increase alertness to encourage better participation and performance during OT session.  Pt completed dynamic sitting task with reach/grasp/throw component and facilitation of forward trunk lean and weight shifting to the left.  Pt completed horse shoe toss needing significantly increased time for  initiation and motor processing.  Also needing repetitive multimodal cues to promote further forward lean and weight shift.  Pt having difficulty with motor coordination to toss horseshoe therefore brought target closer to downgrade task and encourage pt success. Pt overall appearing to enjoy task and outdoor environment and receptive to giving therapist a high five when successfully tossing horseshoe to target.  Transported back to room, tilted posteriorly in TIS w/c for pressure relief, call bell in reach, seat alarm on.  Therapy Documentation Precautions:  Precautions Precautions: Fall Precaution Comments: R hemi, global aphasia Restrictions Weight Bearing Restrictions: No   Therapy/Group: Individual Therapy  Amie Critchley 11/18/2020, 3:15 PM

## 2020-11-18 NOTE — Progress Notes (Signed)
Speech Language Pathology Daily Session Note  Patient Details  Name: Raymond Kerr MRN: [id number] Date of Birth: 1955/09/18  Today's Date: 11/18/2020 SLP Individual Time: 4081-4481 SLP Individual Time Calculation (min): 45 min  Short Term Goals: Week 4: SLP Short Term Goal 1 (Week 4): STG=LTG (anticipated discharge 10/1)  Skilled Therapeutic Interventions:Skilled ST services focused on swallow and communication skills. Pt had oral residue in oral cavity and appeared upset by it with a facial grimace. SLP set up oral care via suction tooth brush, Pt completed with SLP providing mod A for thoroughness. Pt consumed thin liquids via straw with consecutive sips, with no overt s/a aspiration. SLP facilitated use of yes/no responses pertaining to biographical information and immediate environment, Pt was able to point to yes/no on communication board with 50% accuracy and max A multimodal cues. Pt's non verbal response to yes/no questions is still inconsistent when presented with items for PO intake, pt did not respond to 40% of opportunities but closed lips/turned head away to indicate no for a item to consume. Pt did demonstrate increase sustained attention to 5-7 minutes this session sitting in tilt chair. Pt was attempting to verbalize more this session compared to yesterday's session at the CV level, with ability to produce "wa" for water and "pa" for pen. SLP facilitated basic problem solving and following 1 step commands with use of soft touch call bell. Pt initially required total A to grasp/press call bell fading to max A in 70% of opportunities. Pt was unable to select requested items on communication board in a field of 2. Pt was tilted back for comfort, with soft touch within reach and chair alarm set. Recommend to continue skilled ST service.     Pain Pain Assessment Pain Score: 0-No pain  Therapy/Group: Individual Therapy  Simar Pothier  [facility] 11/18/2020, 12:52 PM

## 2020-11-18 NOTE — Patient Care Conference (Signed)
Inpatient RehabilitationTeam Conference and Plan of Care Update Date: 11/18/2020   Time: 13:14 PM    Patient Name: Raymond Kerr      Medical Record Number: [id number]  Date of Birth: 06/15/1955 Sex: Male         Room/Bed: 4M10C/4M10C-01 Payor Info: Payor: /    Admit Date/Time:  10/24/2020  4:41 PM  Primary Diagnosis:  Left thalamic infarction [facility])  Hospital Problems: Principal Problem:   Left thalamic infarction [facility]) Active Problems:   Essential hypertension   Spastic hemiparesis (HCC)   Uncontrolled type 2 diabetes mellitus with hyperglycemia [facility])    Expected Discharge Date: Expected Discharge Date: 11/25/20  Team Members Present: Physician leading conference: Dr. Sula Soda Social Worker Present: Dossie Der, LCSW Nurse Present: Chana Bode, RN PT Present: Rada Hay, PT OT Present: Dolphus Jenny, OT SLP Present: Colin Benton, SLP PPS Coordinator present : Fae Pippin, SLP     Current Status/Progress Goal Weekly Team Focus  Bowel/Bladder             Swallow/Nutrition/ Hydration   dys 3 textures and thin liquids, min-mod A, max-total A feeding  Min A  education and swallow strategies   ADL's   +2 total assist LB self care and functional transfers with posterior and right pushing, min assist oral hygiene, max assist UB bathing and dressing  mod-max assist  self care training, NMR, functional transfers, standing tolerance/endurance, sitting and standing balance, PROM RUE, caregiver education and dc planning   Mobility   total assist transfers, +2 required.  Downgraded to maxA, will likely need to dc wc goal  family education, dc planning, midline orientation, transfers, bed mobility   Communication   Max A  Mod A - downgraded 9/19  education, yes/no, vocalization and use of communication board   Safety/Cognition/ Behavioral Observations  Max-Mod A  mod A - downgraded 9/19  sustained attention and functional problem solving/call bell   Pain              Skin               Discharge Planning:  Daughter came in for observation Sat, coming in Thursday and Friday, along with Sat, Unsure how to sent pt home at plus 2 total assist   Team Discussion: Patient appears depressed with increased alertness. Reviewed discharge plans, home environment and support available. Patient with decreased appetite despite diet upgrade to D3. Incontinent and requires I+O catheterization. MD to address medication for retention.  Patient on target to meet rehab goals: Currently min assist for upper body care and total assist + 2 for lower body or sit - stand activities due to pushing. Patient has difficulty splitting attention and yes/no responses are inconsistent. Increased vocalizations however non intelligible vocalizations. Goals for discharge set at total assist.  *See Care Plan and progress notes for long and short-term goals.   Revisions to Treatment Plan:  Diet upgraded however patient eating less; appears to favor pureed foods and chewing a lot more with D3. May need to downgrade texture.   Teaching Needs: Toileting, care, transfers, medications, diet,secondary risk management, etc.  Current Barriers to Discharge:  Decreased caregiver support, Home enviroment access/layout, Incontinence, Insurance for SNF coverage, and lack of insurance for [facility] services, home equipment, etc  Possible Resolutions to Barriers: Family education with daughter completed Second round of education with daughter scheduled Recommend wheelchair, hospital bed, hoyer lift, bed pads, briefs, etc. SW to follow up with daughter on medicare application and  Submit  application for LOG for SNF Confirm with family space for bed, hoyer, etc in the home     Medical Summary Current Status: Neurogenic bowel and bladder, spasticity, impaired initation, expresive>receptive aphasia, type 2 DM  Barriers to Discharge: Weight;Medical stability;Neurogenic Bowel & Bladder  Barriers to  Discharge Comments: Neurogenic bowel and bladder, spasticity, impaired initation, expresive>receptive aphasia, type 2 DM Possible Resolutions to [employer] Focus: start tamsulosin HS, discontinued Baclofen, will try outpatient Botox, continue ritalin, continue metformin   Continued Need for Acute Rehabilitation Level of Care: The patient requires daily medical management by a physician with specialized training in physical medicine and rehabilitation for the following reasons: Direction of a multidisciplinary physical rehabilitation program to maximize functional independence : Yes Medical management of patient stability for increased activity during participation in an intensive rehabilitation regime.: Yes Analysis of laboratory values and/or radiology reports with any subsequent need for medication adjustment and/or medical intervention. : Yes   I attest that I was present, lead the team conference, and concur with the assessment and plan of the team.   Chana Bode B 11/18/2020, 3:12 PM

## 2020-11-18 NOTE — Progress Notes (Signed)
Patient ID: Raymond Kerr, male   DOB: 05/03/1955, 65 y.o.   MRN: [id number]  message left for daughter-[name] regarding team conference extended to 10/4, to get equipment, education and everything wrapped up for discharge. Daughter to be here Thursday for education.

## 2020-11-18 NOTE — Progress Notes (Addendum)
Physical Therapy Session Note  Patient Details  Name: Raymond Kerr MRN: [id number] Date of Birth: Jan 10, 1956  Today's Date: 11/18/2020 PT Individual Time: 0915-1000 and 3:00-3:30 PT Individual Time Calculation (min): 45 min and 30 min  Short Term Goals: Week 1:  PT Short Term Goal 1 (Week 1): Pt will perform bed mobility with max assist of 1. PT Short Term Goal 1 - Progress (Week 1): Not met PT Short Term Goal 2 (Week 1): Pt will perform sit<>stand with total A of 1. PT Short Term Goal 2 - Progress (Week 1): Not met PT Short Term Goal 3 (Week 1): Pt will initiate gait training PT Short Term Goal 3 - Progress (Week 1): Not met PT Short Term Goal 4 (Week 1): Pt will tolerate sitting in WC >2 hours between therapies PT Short Term Goal 4 - Progress (Week 1): Met Week 2:  PT Short Term Goal 1 (Week 2): Pt will complete bed mobility with maxA of 1 person PT Short Term Goal 2 (Week 2): Pt will complete dynamic sitting balance tasks with maxA for balance PT Short Term Goal 3 (Week 2): Pt will complete bed<>chair transfers with maxA of 1 person PT Short Term Goal 4 (Week 2): Pt will maintain static standing for 2 minutes or greater with maxA of 1 person Week 3:  PT Short Term Goal 1 (Week 3): Pt will perform bed mobility with max assist of 1. PT Short Term Goal 1 - Progress (Week 3): Progressing toward goal PT Short Term Goal 2 (Week 3): Pt will complete bed<>chair transfers with maxA of 1 person PT Short Term Goal 2 - Progress (Week 3): Progressing toward goal PT Short Term Goal 3 (Week 3): Pt will maintain static standing for 2 minutes or greater with maxA of 1 person PT Short Term Goal 3 - Progress (Week 3): Progressing toward goal PT Short Term Goal 4 (Week 3): Pt will participate in family training to prepare for discharge PT Short Term Goal 4 - Progress (Week 3): Progressing toward goal Week 4:  PT Short Term Goal 1 (Week 4): STG= LTG based on ELOS  Skilled Therapeutic  Interventions/Progress Updates:   AM Session Pain - pt grimaces w/ROM to RLE, performed gentle ROM, oscillations w/distraction during stretching. Care taken during rolling/mobility.  Pt initially supine, awake, inconsistently follows simple commands and responds w/fair consistendtly by nodding to yes/no questions.  Pt total assist for rolling and donning pants/socks.  Does assist w/rolling by reaching w/LUE to bedrail. PROM and stretching of RLE, hip/knee/ankle pt grimaces/see above Supine to sit w/total assist. Pt able to sit at edge of bed using LUE on footboard for support, supervision.  Therapist assisted pt w/eating breakfast, cues to hold head upright when swallowing. Cga to min assist w/sitting balance when using LUE to manage cups while drinking w/and w/o straw.   Worked on forward wt shift and reaching to L in sitting to address midline orientation. Bed to wc sliding board transfer total assist, second person for safety, stabilizing wc.  Pt attempts to push w/LUE but prevented when placing L hand on therapist lateral flank during transfer.  Is not able to assist in transfer in any meaningul way/attempts result in strong pushing to R w/LUE/trunk. Pt positioned comfortably in TIS w/mild tilt for safety.  NT w/pt and applying alarm belt/needs.   Pm session Pt initially oob in TIS.  Nods "yes" to confirm he would like to return to bed. TIS brought to upright.  Pt scoots  hips L w/cues, R w/max assist. Sit to stand in [place] w/mod assist to initiate, mod assist of 2 for safety due to heavy pushing to R w/LUE. Pt stood 2 min in stedy w/max assist for stabilization of R knee, to prevent R LOB due to pushing. Perched in stedy, worked on reaching to obtain objects placed on bed to L then handing them forward/L to PT tech, mod assist to guide wt shfitng, control/prevent retun to R lean/L pushing between.  Engaged in 15 min wt shifting activity. Sit to stand in stedy w/max assist as above, transfereed  to edge of bed w/max assist to control descent to sitting.   Pt sat at edge of bed w/good midline orientation following activity > 3 min. Sit to supine w/max assist of 2. Pt left supine w/rails up x 3, alarm set, bed in lowest position, and needs in reach.   Therapy Documentation Precautions:  Precautions Precautions: Fall Precaution Comments: R hemi, global aphasia Restrictions Weight Bearing Restrictions: No  Therapy/Group: Individual Therapy Callie Fielding, [place] 11/18/2020, 12:23 PM

## 2020-11-18 NOTE — Progress Notes (Signed)
PROGRESS NOTE   Subjective/Complaints: Tried regular wheelchair but he was pushing too hard and it was unsafe to leave him in it. No funding for tilt-in-space. Daughter is coming in this weekend for caregiver training.  Alert and makes good eye contact.    ROS: Denies pain, +spasticity   Objective:   No results found. No results for input(s): WBC, HGB, HCT, PLT in the last 72 hours.   No results for input(s): NA, K, CL, CO2, GLUCOSE, BUN, CREATININE, CALCIUM in the last 72 hours.     Intake/Output Summary (Last 24 hours) at 11/18/2020 1318 Last data filed at 11/18/2020 0945 Gross per 24 hour  Intake 520 ml  Output 728 ml  Net -208 ml        Physical Exam: Vital Signs Blood pressure 131/86, pulse (!) 106, temperature 98.6 F (37 C), temperature source Oral, resp. rate 19, height 6' (1.829 m), weight 109.9 kg, SpO2 100 %.  General: No acute distress Mood and affect are appropriate Heart: Tachycardia Lungs: Clear to auscultation, breathing unlabored, no rales or wheezes Abdomen: Positive bowel sounds, soft nontender to palpation, nondistended Extremities: No clubbing, cyanosis, or edema Skin: No evidence of breakdown, no evidence of rash  Neuro: Alert, right central 7. Has a hard time keeping eyes open. Leans to right.  Global aphasia Makes eye contact, engages temporarily, not following commands No resting tone  Assessment/Plan: 1. Functional deficits which require 3+ hours per day of interdisciplinary therapy in a comprehensive inpatient rehab setting. Physiatrist is providing close team supervision and 24 hour management of active medical problems listed below. Physiatrist and rehab team continue to assess barriers to discharge/monitor patient progress toward functional and medical goals  Care Tool:  Bathing    Body parts bathed by patient: Face, Right arm, Chest, Abdomen, Front perineal area   Body  parts bathed by helper: Buttocks, Right upper leg, Left upper leg, Left lower leg, Right lower leg, Left arm     Bathing assist Assist Level: Maximal Assistance - Patient 24 - 49%     Upper Body Dressing/Undressing Upper body dressing   What is the patient wearing?: Pull over shirt    Upper body assist Assist Level: Moderate Assistance - Patient 50 - 74%    Lower Body Dressing/Undressing Lower body dressing      What is the patient wearing?: Pants, Incontinence brief     Lower body assist Assist for lower body dressing: Total Assistance - Patient < 25% (bed level)     Toileting Toileting    Toileting assist Assist for toileting: 2 Helpers     Transfers Chair/bed transfer  Transfers assist  Chair/bed transfer activity did not occur: Safety/medical concerns  Chair/bed transfer assist level: 2 Helpers (Slide board)     Locomotion Ambulation   Ambulation assist   Ambulation activity did not occur: Safety/medical concerns          Walk 10 feet activity   Assist  Walk 10 feet activity did not occur: Safety/medical concerns        Walk 50 feet activity   Assist Walk 50 feet with 2 turns activity did not occur: Safety/medical concerns  Walk 150 feet activity   Assist Walk 150 feet activity did not occur: Safety/medical concerns         Walk 10 feet on uneven surface  activity   Assist Walk 10 feet on uneven surfaces activity did not occur: Safety/medical concerns         Wheelchair     Assist Is the patient using a wheelchair?:  (yes, per PT note) Type of Wheelchair:  (manual per PT note) Wheelchair activity did not occur: Safety/medical concerns         Wheelchair 50 feet with 2 turns activity    Assist    Wheelchair 50 feet with 2 turns activity did not occur: Safety/medical concerns       Wheelchair 150 feet activity     Assist  Wheelchair 150 feet activity did not occur: Safety/medical  concerns       Blood pressure 131/86, pulse (!) 106, temperature 98.6 F (37 C), temperature source Oral, resp. rate 19, height 6' (1.829 m), weight 109.9 kg, SpO2 100 %.    Medical Problem List and Plan: 1.  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  -Interdisciplinary Team Conference today   2.  Impaired mobility -DVT/anticoagulation:  Pharmaceutical: Continue Lovenox             -antiplatelet therapy: Aspirin 81 mg daily and Plavix 75 mg daily x90 days then aspirin alone 3. Pain Management: Tylenol as needed 4. Mood: Provide emotional support             -antipsychotic agents: N/A 5. Neuropsych: This patient is not capable of making decisions on his own behalf. 6. Skin/Wound Care: Routine skin checks 7. Fluids/Electrolytes/Nutrition: Routine in and outs, nsg to push fluids, hopefully megace will help as well  8.  Post stroke dysphagia.  Mechanical soft thin liquids..  Patient limited p.o. intake.   Eating is poor resume Megace 9/25 9.  Hyperlipidemia. Continue Lipitor. LDL reviewed- 124.  10. Uncontrolled Diabetes mellitus with hyperglycemia.  Hemoglobin A1c 10.1.  CBGs 143-171: Semglee 10 units daily.  Diabetic teaching. Maintain metformin to 500mg  BID- will not increase further given nausea with higher dose. Monitor creatinine. Discontinue ISS.  Recent Labs    11/18/20 0021 11/18/20 0838 11/18/20 1133  GLUCAP 123* 120* 162*   Elevated to 162  Hold  amaryl resume in am if appetite improved .  11.  Obesity.  BMI 32.86.  Dietary follow-up. Discontinue feeding supplement- try to promote regular foods. 12. Sub-optimal potassium: start 11/20/20 daily supplement. Continue supplement.  13. Right arm and leg spasticity:    -continue splinting, ROM  -maintain off  baclofen given lethargy and much improved tone 14. Lethargy: d/c Baclofen and increase Ritalin to 10mg  BID 15. Transaminitis: d/c tylenol and monitor LFTs weekly. Still rising, discussed with pharmacy and statin may contribute weakly, will maintain for now given stroke risk and repeat next week. Liver ultrasound ordered 16. Impaired initiation: good response to Ritalin-increase Ritalin to 10mg  BID.   -dc baclofen 17.  Essential hypertension: increase Norvasc to 10mg  Vitals:   11/18/20 0505 11/18/20 1258  BP: 130/74 131/86  Pulse: 98 (!) 106  Resp: 18 19  Temp: 98.6 F (37 C) 98.6 F (37 C)  SpO2: 97% 100%   18.  Constipation:  continue suppository PRN and Miralax BID.  19. Tachycardia: EKG shows sinus tachycardia: continue to monitor. 20. Urinary retention: start flomax 0.4mg  HS 21. Disposition: d/c 10/1 (29 days total). F/u with me on 10/21. Reaching out to family to let them know he will need 2 person support as well as Nurse, adult. Should initiate family training as soon as possible   LOS: 25 days A FACE TO FACE EVALUATION WAS PERFORMED  Jeanette Rauth P Khadeeja Elden 11/18/2020, 1:18 PM

## 2020-11-19 LAB — GLUCOSE, CAPILLARY
Glucose-Capillary: 68 mg/dL — ABNORMAL LOW (ref 70–99)
Glucose-Capillary: 82 mg/dL (ref 70–99)
Glucose-Capillary: 89 mg/dL (ref 70–99)
Glucose-Capillary: 93 mg/dL (ref 70–99)
Glucose-Capillary: 119 mg/dL — ABNORMAL HIGH (ref 70–99)
Glucose-Capillary: 150 mg/dL — ABNORMAL HIGH (ref 70–99)

## 2020-11-19 MED ORDER — GLIMEPIRIDE 1 MG PO TABS
2.0000 mg | ORAL_TABLET | Freq: Every day | ORAL | Status: DC
  Administered 2020-11-20 – 2020-11-25 (×6): 2 mg via ORAL
  Filled 2020-11-19 (×6): qty 2

## 2020-11-19 MED ORDER — ENSURE ENLIVE PO LIQD
237.0000 mL | Freq: Three times a day (TID) | ORAL | Status: DC
  Administered 2020-11-19 – 2020-11-23 (×3): 237 mL via ORAL

## 2020-11-19 MED ORDER — PROSOURCE PLUS PO LIQD
30.0000 mL | Freq: Two times a day (BID) | ORAL | Status: DC
  Administered 2020-11-19 – 2020-11-24 (×8): 30 mL via ORAL
  Filled 2020-11-19 (×9): qty 30

## 2020-11-19 NOTE — Progress Notes (Addendum)
Nutrition Follow-up  DOCUMENTATION CODES:   Obesity unspecified  INTERVENTION:  Provide 30 ml Prosource plus po BID, each supplement provides 100 kcal and 15 grams of protein.   Provide Ensure Enlive po TID, each supplement provides 350 kcal and 20 grams of protein.  Encourage adequate PO intake.   NUTRITION DIAGNOSIS:   Increased nutrient needs related to acute illness as evidenced by estimated needs; ongoing  GOAL:   Patient will meet greater than or equal to 90% of their needs; progressing  MONITOR:   PO intake, Supplement acceptance, Skin, Weight trends, Labs, I & O's  REASON FOR ASSESSMENT:   Malnutrition Screening Tool    ASSESSMENT:   65 year old right-handed male with history of hypertension, diabetes mellitus as well as hyperlipidemia. Presented 10/06/2020 with dysarthria, diplopia, incoordination and right facial drop. Cranial CT scan showed low-density area within the left thalamus and right frontal lobe compatible with subacute to chronic infarcts. MRI /MRA small acute infarcts of the left paramedian frontal lobe and ventral medial left thalamus. Repeat MRI revealed extension of both his left frontal and brainstem infarcts as well as new watershed infarct in the internal border zone on the left. Therapy evaluations completed due to patient's aphasia and decreased functional mobility was admitted for a comprehensive rehab program.  Pt is currently on a dysphagia 3 diet with thin liquids. Meal completion has been varied from 5-80% with 5% intake at lunch today. RD to order nutritional supplements to aid in caloric and protein needs.   Labs and medications reviewed. Megace ordered.  Diet Order:   Diet Order             DIET DYS 3 Room service appropriate? Yes with Assist; Fluid consistency: Thin  Diet effective now                   EDUCATION NEEDS:   Not appropriate for education at this time  Skin:  Skin Assessment: Reviewed RN Assessment  Last BM:   9/27  Height:   Ht Readings from Last 1 Encounters:  10/24/20 6' (1.829 m)    Weight:   Wt Readings from Last 1 Encounters:  11/07/20 109.9 kg   BMI:  Body mass index is 32.86 kg/m.  Estimated Nutritional Needs:   Kcal:  2150-2350  Protein:  115-130 grams  Fluid:  >/= 2 L/day  Roslyn Smiling, MS, RD, LDN RD pager number/after hours weekend pager number on Amion.

## 2020-11-19 NOTE — Progress Notes (Signed)
Occupational Therapy Session Note  Patient Details  Name: Ryken Nest MRN: [id number] Date of Birth: 12/21/1955  Today's Date: 11/19/2020 OT Individual Time: 1045-1110 OT Individual Time Calculation (min): 25 min    Short Term Goals: Week 3:  OT Short Term Goal 1 (Week 3): Pt will don tshirt with mod A. OT Short Term Goal 1 - Progress (Week 3): Progressing toward goal OT Short Term Goal 2 (Week 3): Pt will demonstrate improved sit balance to sit EOB with min A while bathing UB with min a. OT Short Term Goal 2 - Progress (Week 3): Progressing toward goal OT Short Term Goal 3 (Week 3): Pt will scan plate and initiate self feeding using left hand with supervision. OT Short Term Goal 3 - Progress (Week 3): Met OT Short Term Goal 4 (Week 3): Pt will bathe UB with min assist. OT Short Term Goal 4 - Progress (Week 3): Revised due to lack of progress  Skilled Therapeutic Interventions/Progress Updates:    Pt resting in TIS w/c upon arrival. OT intervention with focus on RUE PROM/AAROM and gentle stretching. Pt with significant tightness at elbow, shoulder, and wrist to facilitate increased functional use. Pt responded well to PROM and stretching. Pt nonverbal but with no s/s of discomfort. Pt remained in w/c with belt alarm activated and soft call bell placed where pt can activate.   Therapy Documentation Precautions:  Precautions Precautions: Fall Precaution Comments: R hemi, global aphasia Restrictions Weight Bearing Restrictions: No   Pain: Pt with no s/s of pain   Therapy/Group: Individual Therapy  Lanier, Thomas Chappell 11/19/2020, 12:16 PM 

## 2020-11-19 NOTE — Progress Notes (Signed)
Hypoglycemic Event  CBG: 68  Treatment: 4 oz juice/soda  Symptoms: None  Follow-up CBG: [id number] CBG Result:82  Possible Reasons for Event: Other: patient doesn't have much of an appetite.  Comments/MD notified: CBG rechecked and in okay range, MD was notified during rounds    Raymond Kerr [name]

## 2020-11-19 NOTE — Progress Notes (Signed)
Occupational Therapy Weekly Progress Note  Patient Details  Name: Quenten Nawaz MRN: [id number] Date of Birth: 1955-08-20  Beginning of progress report period: November 10, 2020 End of progress report period: November 19, 2020  Today's Date: 11/19/2020 OT Individual Time: 0915-1000 OT Individual Time Calculation (min): 45 min    Patient has met 2 of 4 short term goals.  Pt progressing very slowly due to significant persistent push towards right side with impaired midline awareness.  Pt also has very slow processing time and significant cognitive/perceptual awareness which hinders pt's carryover of skilled training including hemi techniques during self care and functional mobility.  Pt currently requires total assist +2 for squat pivot transfers.  He has improved ability to forward lean while seated with significant cueing.  Pt did donn and doff shirt today with only min assist with max multimodal cueing, so noting very recent improvements ( he was needing mod to max assist for UB dressing).  Pts daughter will be alone in caretaking responsibilities for pt and therefore needs significant caregiver education but is unable to attend until the end of this week.  Plan to educate caregiver on bed level self care and mobility to ensure safe transition to home setting.    Patient continues to demonstrate the following deficits: muscle weakness, muscle joint tightness, and muscle paralysis, decreased cardiorespiratoy endurance, impaired timing and sequencing, abnormal tone, unbalanced muscle activation, motor apraxia, decreased coordination, and decreased motor planning, decreased visual acuity, decreased visual perceptual skills, and decreased visual motor skills, decreased midline orientation, right side neglect, decreased motor planning, and ideational apraxia, decreased initiation, decreased attention, decreased awareness, decreased problem solving, decreased safety awareness, decreased memory, and  delayed processing, and decreased sitting balance, decreased standing balance, decreased postural control, hemiplegia, and decreased balance strategies and therefore will continue to benefit from skilled OT intervention to enhance overall performance with BADL.  Patient progressing toward long term goals..  Continue plan of care.  OT Short Term Goals Week 3:  OT Short Term Goal 1 (Week 3): Pt will don tshirt with mod A. OT Short Term Goal 1 - Progress (Week 3): Progressing toward goal OT Short Term Goal 2 (Week 3): Pt will demonstrate improved sit balance to sit EOB with min A while bathing UB with min a. OT Short Term Goal 2 - Progress (Week 3): Progressing toward goal OT Short Term Goal 3 (Week 3): Pt will scan plate and initiate self feeding using left hand with supervision. OT Short Term Goal 3 - Progress (Week 3): Met OT Short Term Goal 4 (Week 3): Pt will bathe UB with min assist. OT Short Term Goal 4 - Progress (Week 3): Revised due to lack of progress Week 4:  OT Short Term Goal 1 (Week 4): Pt will don tshirt with mod A. OT Short Term Goal 2 (Week 4): Pt will demonstrate improved sit balance to sit EOB with min A while bathing UB with min a. OT Short Term Goal 3 (Week 4): Caregiver will demonstrate independence with assisting pt during bed mobility and bed level self care. OT Short Term Goal 4 (Week 4): Pt will complete squat pivot with total assist of one in prep for sinkside self care.  Skilled Therapeutic Interventions/Progress Updates:    Pt semi reclined in bed but awake. Nursing present and performing catheterization therefore 15 minutes missed treatment initially.  Pt shaking his head "no" when asked if he has any pain.  Provided communication board, but pt not initiating pointing to needs.  Pt required total assist of one to donn ted hose and pants with pt pulling up over hips on left side partially with left sided bridging technique. Needing to roll to left to pull pants over  right hip dependently.  Right sidelying to sit with total assist +2.    Pt doffed shirt overhead with step by step cues and min assist to pull over head.  Pt needing increased time to problem solve and process cues when threading arms out of sleeves.  Pt reached for wash cloth placed on tray table in front and initiated washing RUE however appearing to perseverate on this and having difficulty moving to next step despite multimodal cueing provided.  Pt required max assist to bathe UB and hand over hand provided to right for neuro re-ed when washing left side of body with crossing of midline achieved.  Pt needing forward chaining to donn shirt using hemi technique due to pt neglecting RUE during task.  Pt able to finish donning of shirt with min assist after therapist threaded right hand in sleeve but still requiring max multimodal and repetitive cueing.   Pt performed block practice forward leans with multimodal cues and external target of "touch your toes" in preperation for squat pivot transfer.  Pt responded well to this cue and significantly increased forward lean achieved with decreased pushing through LUE and LLE.  Squat pivot completed to TIS w/c with total assist +2 however pt resisting less.  Therapist did need to reduce weightbearing through pts LLE by blocking in order to reduce posterior right pushing.  Call bell in reach, RUE supported, seat alarm on.  Therapy Documentation Precautions:  Precautions Precautions: Fall Precaution Comments: R hemi, global aphasia Restrictions Weight Bearing Restrictions: No    Therapy/Group: Individual Therapy  Ezekiel Slocumb 11/19/2020, 12:49 PM

## 2020-11-19 NOTE — Progress Notes (Signed)
Physical Therapy Session Note  Patient Details  Name: Raymond Kerr MRN: [id number] Date of Birth: 31-Oct-1955  Today's Date: 11/19/2020 PT Individual Time: 1400-1457 PT Individual Time Calculation (min): 57 min   Short Term Goals: Week 4:  PT Short Term Goal 1 (Week 4): STG= LTG based on ELOS  Skilled Therapeutic Interventions/Progress Updates:    Patient received sitting up in TIS wc, agreeable to PT. He does not appear to be in pain. PT transporting patient in wc to therapy gym for time management and energy conservation. Standing practice and midline orientation with mirror for visual feedback. Initial stand TotalA x2 with strong pushing with L LE. PT added vertical tape line to patients shirt with cue to keep the line vertical. While looking in the mirror, patient able to maintain more appropriate midline with visual cue of tape and MaxA x2. Less pushing with L LE noted. Patient standing 2 additional times with vertical tape visual cue + mirror + cue to reach anteriorly with L UE to tap cone. Patient noted to have posterior bias and excessive hip flexion in previous standing bouts. Cue to tap cone on anterior L bias encouraged anterior + left weight shift. Patient initiating tasks much more easily this session. Anterior reach while seated taping Aire[employer] with L UE completed x5. He did require max multimodal cues to initiate tasks, but was able to do so. Patient returning to room in TIS wc, seatbelt alarm on, tilted back, call light within reach.   Therapy Documentation Precautions:  Precautions Precautions: Fall Precaution Comments: R hemi, global aphasia Restrictions Weight Bearing Restrictions: No     Therapy/Group: Individual Therapy  Elizebeth Koller, PT, DPT, CBIS  11/19/2020, 7:37 AM

## 2020-11-19 NOTE — Progress Notes (Signed)
Speech Language Pathology Daily Session Note  Patient Details  Name: Raymond Kerr MRN: [id number] Date of Birth: [birth year]  Today's Date: 11/19/2020 SLP Individual Time: 1000-1045 SLP Individual Time Calculation (min): 45 min  Short Term Goals: Week 4: SLP Short Term Goal 1 (Week 4): STG=LTG (anticipated discharge 10/1)  Skilled Therapeutic Interventions:   Patient seen for skilled ST session focusing on aphasia goals. He was sitting up in [facility] having just finished OT session when SLP arrived. He was agreeable to going to speech therapy room down the hall. Patient was alert and did exhibit more expression, smiling, giving fist bumps to therapist's he knew and chuckling when SLP putting photo of a hat on SLP's head. He named 10 of 14 object photos with partial phrase cues and pointed to identify opposites in field of 2 with 5 of 7  correct. When SLP showing pictures and names of musicians, he would shake head or nod to show like or dislike, seeming to be fairly consistent as SLP would ask same questions repeatedly throughout. At times, patient would take photo cards and instead of pointing to respond in field of two, would pick cards up, place on top of each other and start to push away. SLP uncertain if patient trying to communicate he did not want to participate. He continues to benefit from skilled SLP intervention to maximize language and swallow function goals prior to discharge. SLP left patient in chair with alarm belt and next therapist in room with him.   Pain Pain Assessment Pain Scale: Faces Faces Pain Scale: No hurt  Therapy/Group: Individual Therapy  Angela Nevin, MA, CCC-SLP Speech Therapy

## 2020-11-19 NOTE — Progress Notes (Signed)
PROGRESS NOTE   Subjective/Complaints: Trying to verbalize more this morning, but having a hard time doing so.  CBGs down to 68- will decrease Amaryl to 2mg .    ROS: Denies pain, +spasticity   Objective:   No results found. No results for input(s): WBC, HGB, HCT, PLT in the last 72 hours.   No results for input(s): NA, K, CL, CO2, GLUCOSE, BUN, CREATININE, CALCIUM in the last 72 hours.     Intake/Output Summary (Last 24 hours) at 11/19/2020 1012 Last data filed at 11/19/2020 0900 Gross per 24 hour  Intake 360 ml  Output 650 ml  Net -290 ml        Physical Exam: Vital Signs Blood pressure 131/81, pulse 90, temperature 98.6 F (37 C), resp. rate 16, height 6' (1.829 m), weight 109.9 kg, SpO2 100 %. Gen: no distress, normal appearing HEENT: oral mucosa pink and moist, NCAT Cardio: Reg rate Chest: normal effort, normal rate of breathing Abd: soft, non-distended Ext: no edema Psych: pleasant, normal affect Skin: intact  Neuro: Alert, right central 7. Has a hard time keeping eyes open. Leans to right.  Global aphasia Makes eye contact, engages temporarily, not following commands No resting tone  Assessment/Plan: 1. Functional deficits which require 3+ hours per day of interdisciplinary therapy in a comprehensive inpatient rehab setting. Physiatrist is providing close team supervision and 24 hour management of active medical problems listed below. Physiatrist and rehab team continue to assess barriers to discharge/monitor patient progress toward functional and medical goals  Care Tool:  Bathing    Body parts bathed by patient: Face, Right arm, Chest, Abdomen, Front perineal area   Body parts bathed by helper: Buttocks, Right upper leg, Left upper leg, Left lower leg, Right lower leg, Left arm     Bathing assist Assist Level: Maximal Assistance - Patient 24 - 49%     Upper Body Dressing/Undressing Upper  body dressing   What is the patient wearing?: Pull over shirt    Upper body assist Assist Level: Moderate Assistance - Patient 50 - 74%    Lower Body Dressing/Undressing Lower body dressing      What is the patient wearing?: Pants, Incontinence brief     Lower body assist Assist for lower body dressing: Total Assistance - Patient < 25% (bed level)     Toileting Toileting    Toileting assist Assist for toileting: 2 Helpers     Transfers Chair/bed transfer  Transfers assist  Chair/bed transfer activity did not occur: Safety/medical concerns  Chair/bed transfer assist level: 2 Helpers 09-[birth year]-1984)     Locomotion Ambulation   Ambulation assist   Ambulation activity did not occur: Safety/medical concerns          Walk 10 feet activity   Assist  Walk 10 feet activity did not occur: Safety/medical concerns        Walk 50 feet activity   Assist Walk 50 feet with 2 turns activity did not occur: Safety/medical concerns         Walk 150 feet activity   Assist Walk 150 feet activity did not occur: Safety/medical concerns         Walk 10  feet on uneven surface  activity   Assist Walk 10 feet on uneven surfaces activity did not occur: Safety/medical concerns         Wheelchair     Assist Is the patient using a wheelchair?:  (yes, per PT note) Type of Wheelchair:  (manual per PT note) Wheelchair activity did not occur: Safety/medical concerns         Wheelchair 50 feet with 2 turns activity    Assist    Wheelchair 50 feet with 2 turns activity did not occur: Safety/medical concerns       Wheelchair 150 feet activity     Assist  Wheelchair 150 feet activity did not occur: Safety/medical concerns       Blood pressure 131/81, pulse 90, temperature 98.6 F (37 C), resp. rate 16, height 6' (1.829 m), weight 109.9 kg, SpO2 100 %.    Medical Problem List and Plan: 1.  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 2.  Impaired mobility -DVT/anticoagulation:  Pharmaceutical: Continue Lovenox             -antiplatelet therapy: Aspirin 81 mg daily and Plavix 75 mg daily x90 days then aspirin alone 3. Pain Management: Tylenol as needed 4. Mood: Provide emotional support             -antipsychotic agents: N/A 5. Neuropsych: This patient is not capable of making decisions on his own behalf. 6. Skin/Wound Care: Routine skin checks 7. Fluids/Electrolytes/Nutrition: Routine in and outs, nsg to push fluids, hopefully megace will help as well  8.  Post stroke dysphagia.  Mechanical soft thin liquids..  Patient limited p.o. intake.   Eating is poor resume Megace 9/25 9.  Hyperlipidemia. Continue Lipitor. LDL reviewed- 124.  10. Uncontrolled Diabetes mellitus with hyperglycemia.  Hemoglobin A1c 10.1.  CBGs 143-171: Semglee 10 units daily.  Diabetic teaching. Maintain metformin to 500mg  BID- will not increase further given nausea with higher dose. Monitor creatinine. Discontinue ISS.  Recent Labs    11/19/20 0751 11/19/20 0823 11/19/20 0900  GLUCAP 68* 82 89  Decrease Amaryl to 2mg .  11.  Obesity.  BMI 32.86.  Dietary follow-up. Discontinue feeding supplement- try to promote regular foods. 12. Sub-optimal potassium: start 11/21/20 daily supplement. Continue supplement.  13. Right arm and leg spasticity:    -continue splinting, ROM  -maintain off baclofen given lethargy and much improved tone 14. Lethargy: d/c Baclofen and increase Ritalin to 10mg  BID 15. Transaminitis: d/c tylenol and monitor LFTs weekly. Still rising, discussed with pharmacy and statin may contribute weakly, will maintain for now given stroke risk and repeat next week. Liver ultrasound ordered 16. Impaired initiation: good response  to Ritalin-increase Ritalin to 10mg  BID.   -dc baclofen 17.  Essential hypertension: increase Norvasc to 10mg  Vitals:   11/18/20 1927 11/19/20 0413  BP: 111/70 131/81  Pulse: 98 90  Resp: 18 16  Temp: 99.5 F (37.5 C) 98.6 F (37 C)  SpO2: 98% 100%   18.  Constipation: continue suppository PRN and Miralax BID.  19. Tachycardia: EKG shows sinus tachycardia: continue to monitor. 20. Urinary retention: start flomax 0.4mg  HS 21. Disposition: d/c 10/1 (29 days total). F/u with me on 10/21. Reaching out to family to let them know he will need 2 person support  as well as Nurse, adult. Should initiate family training as soon as possible   LOS: 26 days A FACE TO FACE EVALUATION WAS PERFORMED  Santanna Olenik P Missael Ferrari 11/19/2020, 10:12 AM

## 2020-11-20 LAB — GLUCOSE, CAPILLARY
Glucose-Capillary: 95 mg/dL (ref 70–99)
Glucose-Capillary: 130 mg/dL — ABNORMAL HIGH (ref 70–99)
Glucose-Capillary: 172 mg/dL — ABNORMAL HIGH (ref 70–99)
Glucose-Capillary: 160 mg/dL — ABNORMAL HIGH (ref 70–99)

## 2020-11-20 NOTE — Progress Notes (Signed)
Speech Language Pathology Daily Session Note  Patient Details  Name: Raymond Kerr MRN: [id number] Date of Birth: [birth year]  Today's Date: 11/20/2020 SLP Individual Time: 1100-1200 SLP Individual Time Calculation (min): 60 min  Short Term Goals: Week 4: SLP Short Term Goal 1 (Week 4): STG=LTG (anticipated discharge 10/1)  Skilled Therapeutic Interventions:  Patient seen for skilled SLP intervention with focus on education of daughter regarding speech, language, swallow function. Daughter asked questions about recovery time from stroke and SLP informed her that unfortunately patient will likely always require assistance/cues with communication of basic wants/needs and ability to safely consume liquids and solids. SLP demonstrated use of dry erase board with 2 cell and 4 cell printed words for patient to point to when making choices. Patient pointed to 'juice' in field of 4 choices, then pointed to 'grape' in field of 4 choices. SLP assessed reliability of patient's responses by changing order of choices and with patient generally pointing to same words. Patient able to name 3 picture cards without cues and for others, named with initial word/phrase cues. Patient did appear to become frustrated/emotional at some point and did point to "upset" in field of 4 choices for feelings. Of note, he did not attempt much interaction with daughter in room, requiring maxA verbal, tactile, visual cues to look at her (on right side). Patient continues to benefit from skilled SLP intervention to maximize cognitive-linguistic and swallow function prior to discharge.  Pain Pain Assessment Pain Scale: Faces Faces Pain Scale: No hurt  Therapy/Group: Individual Therapy  Angela Nevin, MA, CCC-SLP Speech Therapy

## 2020-11-20 NOTE — Progress Notes (Signed)
Physical Therapy Session Note  Patient Details  Name: Raymond Kerr MRN: [id number] Date of Birth: [birth year]  Today's Date: 11/20/2020 PT Individual Time: 1310-1408 PT Individual Time Calculation (min): 58 min   Short Term Goals: Week 4:  PT Short Term Goal 1 (Week 4): STG= LTG based on ELOS  Skilled Therapeutic Interventions/Progress Updates:    Pt received supported sitting in bed with his daughter, [name], present assisting pt with meal. Pt appears agreeable to therapy session with focus on family education/training.  Educated family on pt's CLOF of +2 max/total assist for supine<>sit and pt's significant pushing causing strong R posterior trunk lean/LOB therefore recommendation that pt remain at bed level upon discharge home as pt's daughter will not have consistent +2 assist.  Due to insurance and financial limitations pt will only be receiving a hospital bed upon D/C - pt is not safe to sit in a standard manual wheelchair due to risk of falling out (requires a custom TIS wheelchair for safety and for pressure relieving measures). Educated pt's daughter that once pt has insurance, she needs to discuss with MD starting therapy again and having assistance obtaining a custom wheelchair and additional DME needs (such as a hoyer lift - just showed this to her for reference).  Pt's daughter able to recall from OT session on how to have pt use L UE to assist with rolling in bed - educated her on recommendation to obtain some incontinence pads to assist with decreased laundry and to be used to assist pt with bed mobility. Pt's daughter able to assist him with rolling and scooting towards [facility] with pt doing good to initiate assisting with these tasks using L hemibody as able. Educated daughter on adjusting bed features to decrease her burden of care and reduce risk for injury.   Educated on importance of pressure relief via rolling every hour to avoid pressure injury and utilizing pillows to float  pt's heels. Therapist provided pt's daughter with pen and paper to write down education throughout session. Discussed that pt will require medical transport home at and this time will require medical transport to/from appointments - recommending following up with SW to obain information about this. Pt's daughter reports no questions at this time but will benefit from additional education/training and is planning to return tomorrow. At end of session, pt left R sidelying in bed for pressure relief with needs in reach and pt's daughter present.  Therapy Documentation Precautions:  Precautions Precautions: Fall Precaution Comments: R hemi, global aphasia Restrictions Weight Bearing Restrictions: No   Pain:  Pt continues to grimace with R LE movements into hip/knee flexion when assisting with bed mobility.    Therapy/Group: Individual Therapy  Ginny Forth , PT, DPT, NCS, CSRS  11/20/2020, 12:50 PM

## 2020-11-20 NOTE — Progress Notes (Signed)
PROGRESS NOTE   Subjective/Complaints: Daughter present for therapy- feeding him well. Discussed prognosis, plan for outpatient Botox No signs of pain    ROS: Denies pain, +spasticity   Objective:   No results found. No results for input(s): WBC, HGB, HCT, PLT in the last 72 hours.   No results for input(s): NA, K, CL, CO2, GLUCOSE, BUN, CREATININE, CALCIUM in the last 72 hours.     Intake/Output Summary (Last 24 hours) at 11/20/2020 1351 Last data filed at 11/20/2020 0830 Gross per 24 hour  Intake 240 ml  Output 700 ml  Net -460 ml        Physical Exam: Vital Signs Blood pressure 131/81, pulse 92, temperature 98.4 F (36.9 C), resp. rate 20, height 6' (1.829 m), weight 109.9 kg, SpO2 100 %. Gen: no distress, normal appearing HEENT: oral mucosa pink and moist, NCAT Cardio: Reg rate Chest: normal effort, normal rate of breathing Abd: soft, non-distended Ext: no edema Psych: pleasant, normal affect Skin: intact   Neuro: Alert, right central 7. Has a hard time keeping eyes open. Leans to right.  Global aphasia Makes eye contact, engages temporarily, not following commands No resting tone  Assessment/Plan: 1. Functional deficits which require 3+ hours per day of interdisciplinary therapy in a comprehensive inpatient rehab setting. Physiatrist is providing close team supervision and 24 hour management of active medical problems listed below. Physiatrist and rehab team continue to assess barriers to discharge/monitor patient progress toward functional and medical goals  Care Tool:  Bathing    Body parts bathed by patient: Face, Right arm, Chest, Abdomen, Front perineal area   Body parts bathed by helper: Buttocks, Right upper leg, Left upper leg, Left lower leg, Right lower leg, Left arm     Bathing assist Assist Level: Maximal Assistance - Patient 24 - 49%     Upper Body Dressing/Undressing Upper  body dressing   What is the patient wearing?: Pull over shirt    Upper body assist Assist Level: Moderate Assistance - Patient 50 - 74%    Lower Body Dressing/Undressing Lower body dressing      What is the patient wearing?: Pants, Incontinence brief     Lower body assist Assist for lower body dressing: Total Assistance - Patient < 25% (bed level)     Toileting Toileting    Toileting assist Assist for toileting: 2 Helpers     Transfers Chair/bed transfer  Transfers assist  Chair/bed transfer activity did not occur: Safety/medical concerns  Chair/bed transfer assist level: 2 Helpers Building services engineer)     Locomotion Ambulation   Ambulation assist   Ambulation activity did not occur: Safety/medical concerns          Walk 10 feet activity   Assist  Walk 10 feet activity did not occur: Safety/medical concerns        Walk 50 feet activity   Assist Walk 50 feet with 2 turns activity did not occur: Safety/medical concerns         Walk 150 feet activity   Assist Walk 150 feet activity did not occur: Safety/medical concerns         Walk 10 feet on uneven surface  activity   Assist Walk 10 feet on uneven surfaces activity did not occur: Safety/medical concerns         Wheelchair     Assist Is the patient using a wheelchair?:  (yes, per PT note) Type of Wheelchair:  (manual per PT note) Wheelchair activity did not occur: Safety/medical concerns         Wheelchair 50 feet with 2 turns activity    Assist    Wheelchair 50 feet with 2 turns activity did not occur: Safety/medical concerns       Wheelchair 150 feet activity     Assist  Wheelchair 150 feet activity did not occur: Safety/medical concerns       Blood pressure 131/81, pulse 92, temperature 98.4 F (36.9 C), resp. rate 20, height 6' (1.829 m), weight 109.9 kg, SpO2 100 %.    Medical Problem List and Plan: 1.  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 2.  Impaired mobility -DVT/anticoagulation:  Pharmaceutical: Continue Lovenox             -antiplatelet therapy: Aspirin 81 mg daily and Plavix 75 mg daily x90 days then aspirin alone 3. Pain Management: Tylenol as needed 4. Mood: Provide emotional support             -antipsychotic agents: N/A 5. Neuropsych: This patient is not capable of making decisions on his own behalf. 6. Skin/Wound Care: Routine skin checks 7. Fluids/Electrolytes/Nutrition: Routine in and outs, nsg to push fluids, hopefully megace will help as well  8.  Post stroke dysphagia.  Mechanical soft thin liquids..  Patient limited p.o. intake.  Eating better, continue megace 9.  Hyperlipidemia. Continue Lipitor. LDL reviewed- 124.  10. Uncontrolled Diabetes mellitus with hyperglycemia.  Hemoglobin A1c 10.1.  CBGs 143-171: Semglee 10 units daily.  Diabetic teaching. Maintain metformin to 500mg  BID- will not increase further given nausea with higher dose. Monitor creatinine. Discontinue ISS.  Recent Labs    11/19/20 2124 11/20/20 0623 11/20/20 1156  GLUCAP 150* 95 130*  Decrease Amaryl to 2mg .  11.  Obesity.  BMI 32.86.  Dietary follow-up. Discontinue feeding supplement- try to promote regular foods. 12. Sub-optimal potassium: start 11/22/20 daily supplement. Continue supplement.  13. Right arm and leg spasticity:    -continue splinting, ROM  -maintain off baclofen given lethargy and much improved tone 14. Lethargy: d/c Baclofen and increase Ritalin to 10mg  BID 15. Transaminitis: d/c tylenol and monitor LFTs weekly. Still rising, discussed with pharmacy and statin may contribute weakly, will maintain for now given stroke risk and repeat next week. Liver ultrasound ordered 16. Impaired initiation: good response to  Ritalin-increase Ritalin to 10mg  BID.   -dc baclofen 17.  Essential hypertension: increase Norvasc to 10mg  Vitals:   11/19/20 2053 11/20/20 0548  BP: 131/87 131/81  Pulse: 99 92  Resp: 18 20  Temp: 97.9 F (36.6 C) 98.4 F (36.9 C)  SpO2:  100%   18.  Constipation: continue suppository PRN and Miralax BID.  19. Tachycardia: EKG shows sinus tachycardia: continue to monitor. 20. Urinary retention: start flomax 0.4mg  HS 21. Disposition: d/c 10/4 (32 days total). F/u with me on 10/21. Reaching out to family to let them know he will need 2 person support as well as . Continue family training  LOS: 27 days A FACE TO FACE EVALUATION WAS PERFORMED  Clint Bolder P Diann Bangerter 11/20/2020, 1:51 PM

## 2020-11-20 NOTE — Progress Notes (Addendum)
Patient ID: Raymond Kerr, male   DOB: 1955/09/15, 65 y.o.   MRN: [id number]  Met with pt and daughter-Raymond Kerr who is here to begin hands on training. She had some medical questions have asked-messaged MD to call her regarding this. Discussed equipment-hospital bed, hoyer lift and ques wheelchair. Confirmed daughter's address. She will be here again tomorrow and Sat. Aware of discharge date of 10/4. She was granted temporary guardianship on 9/28 of pt. She reports between she and two sister's they will help each other but the majority will fall upon her. She is trying to take it all in and learn his care and is participating in his therapies.  2:30 PM Will try to ask management to give pt a LOG for going to a SNF. Have also completed a FL2 and will sent out to see if any facility would offer a bed. Pt's care seems to be more than daughter can provide. He will be high risk for re-admission.

## 2020-11-20 NOTE — Progress Notes (Signed)
Occupational Therapy Session Note  Patient Details  Name: Raymond Kerr MRN: [id number] Date of Birth: [birth year]  Today's Date: 11/20/2020 OT Individual Time: 2505-3976 OT Individual Time Calculation (min): 26 min    Short Term Goals: Week 4:  OT Short Term Goal 1 (Week 4): Pt will don tshirt with mod A. OT Short Term Goal 2 (Week 4): Pt will demonstrate improved sit balance to sit EOB with min A while bathing UB with min a. OT Short Term Goal 3 (Week 4): Caregiver will demonstrate independence with assisting pt during bed mobility and bed level self care. OT Short Term Goal 4 (Week 4): Pt will complete squat pivot with total assist of one in prep for sinkside self care.  Skilled Therapeutic Interventions/Progress Updates:  Pt greeted asleep in supine but able to arouse and agreeable to BUE therex session with a focus on PROM stretching. Worked on PROM shoulder flexion to 90* with pt completing self ROM with support at elbow as pt able. Also worked on PROM elbow flexion/ extension, wrist flexion/extension, and digit flexion/extension. Applied lotion to pts RUE with pt able to rub lotion in with LUE; left pts RUE positioned on pillow with bed alarm activated with all needs within reach.   Therapy Documentation Precautions:  Precautions Precautions: Fall Precaution Comments: R hemi, global aphasia Restrictions Weight Bearing Restrictions: No    Pain: Pt with no s/s of pain     Therapy/Group: Individual Therapy  Barron Schmid 11/20/2020, 12:49 PM

## 2020-11-20 NOTE — [place] FL2 (Signed)
[place] MEDICAID FL2 LEVEL OF CARE SCREENING TOOL     IDENTIFICATION  Patient Name: Raymond Kerr Birthdate: 03-16-1955 Sex: male Admission Date (Current Location): 10/24/2020  [facility] and [place] Number:  Chiropodist and Address:  [facility], [address] [place], [place] [address]      Provider Number: [id number]  Attending Physician Name and Address:  Horton Chin, MD  Relative Name and Phone Number:  Zebulun Deman [phone]    Current Level of Care: Other (Comment) (rehab) Recommended Level of Care: Skilled Nursing Facility Prior Approval Number:    Date Approved/Denied:   PASRR Number: [id number]  Discharge Plan: SNF    Current Diagnoses: Patient Active Problem List   Diagnosis Date Noted   Essential hypertension    Spastic hemiparesis (HCC)    Uncontrolled type 2 diabetes mellitus with hyperglycemia (HCC)    Left thalamic infarction (HCC) 10/24/2020   Hypotension 10/19/2020   Hypokalemia 10/17/2020   Hypomagnesemia 10/17/2020   Hypophosphatemia 10/17/2020   Pressure injury of skin 10/16/2020   Cerebrovascular accident (CVA) (HCC)    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) 10/07/2020   Paralysis of left third cranial nerve    Right sided weakness    TIA (transient ischemic attack) 10/06/2020   Acute ischemic left ACA stroke (HCC) 10/06/2020   Microhematuria 12/10/2016   Elevated PSA 12/10/2016   Diabetes mellitus (HCC) 11/28/2016   Benign essential hypertension 10/20/2015    Orientation RESPIRATION BLADDER Height & Weight     Self, Situation, Place  Normal Incontinent Weight: 242 lb 4.6 oz (109.9 kg) Height:  6' (182.9 cm)  BEHAVIORAL SYMPTOMS/MOOD NEUROLOGICAL BOWEL NUTRITION STATUS      Incontinent Diet (Dys 3 thin liquids)  AMBULATORY STATUS COMMUNICATION OF NEEDS Skin   Total Care Non-Verbally Normal                       Personal Care Assistance Level of  Assistance  Bathing, Feeding, Dressing Bathing Assistance: Maximum assistance Feeding assistance: Maximum assistance Dressing Assistance: Maximum assistance     Functional Limitations Info  Speech, Sight Sight Info: Impaired Hearing Info: Adequate Speech Info: Impaired    SPECIAL CARE FACTORS FREQUENCY  PT (By licensed PT), OT (By licensed OT), Speech therapy, Bowel and bladder program     PT Frequency: 5x week OT Frequency: 5x week Bowel and Bladder Program Frequency: Timed tolieting to achieve continence   Speech Therapy Frequency: 5x week      Contractures Contractures Info: Not present    Additional Factors Info  Code Status, Allergies Code Status Info: Full Allergies Info: NKDA           Current Medications (11/20/2020):  This is the current hospital active medication list Current Facility-Administered Medications  Medication Dose Route Frequency Provider Last Rate Last Admin   (feeding supplement) PROSource Plus liquid 30 mL  30 mL Oral BID BM Raulkar, Drema Pry, MD   30 mL at 11/20/20 1408   amLODipine (NORVASC) tablet 10 mg  10 mg Oral Daily Raulkar, Drema Pry, MD   10 mg at 11/20/20 1015   aspirin chewable tablet 81 mg  81 mg Oral Daily Charlton Amor, PA-C   81 mg at 11/20/20 1014   atorvastatin (LIPITOR) tablet 80 mg  80 mg Oral Daily Charlton Amor, PA-C   80 mg at 11/20/20 1014   B-complex with vitamin C tablet 1 tablet  1  tablet Oral Daily Raulkar, Drema Pry, MD   1 tablet at 11/20/20 1022   bisacodyl (DULCOLAX) suppository 10 mg  10 mg Rectal Daily PRN Erick Colace, MD       clopidogrel (PLAVIX) tablet 75 mg  75 mg Oral Daily Charlton Amor, PA-C   75 mg at 11/20/20 1014   enoxaparin (LOVENOX) injection 55 mg  55 mg Subcutaneous Q24H Norva Pavlov, RPH   55 mg at 11/20/20 1411   feeding supplement (ENSURE ENLIVE / ENSURE PLUS) liquid 237 mL  237 mL Oral TID BM Raulkar, Drema Pry, MD   237 mL at 11/19/20 1716   glimepiride (AMARYL)  tablet 2 mg  2 mg Oral Q breakfast Raulkar, Drema Pry, MD   2 mg at 11/20/20 1014   lip balm (CARMEX) ointment   Topical PRN Horton Chin, MD       megestrol (MEGACE) 400 MG/10ML suspension 400 mg  400 mg Oral BID Erick Colace, MD   400 mg at 11/20/20 1015   metFORMIN (GLUCOPHAGE) tablet 500 mg  500 mg Oral BID WC Raulkar, Drema Pry, MD   500 mg at 11/20/20 1014   methylphenidate (RITALIN) tablet 10 mg  10 mg Oral BID WC Raulkar, Drema Pry, MD   10 mg at 11/20/20 1408   ondansetron (ZOFRAN) tablet 4 mg  4 mg Oral Q6H PRN Charlton Amor, PA-C   4 mg at 11/13/20 6440   Or   ondansetron (ZOFRAN) injection 4 mg  4 mg Intravenous Q6H PRN Angiulli, Mcarthur Rossetti, PA-C       polyethylene glycol (MIRALAX / GLYCOLAX) packet 17 g  17 g Oral BID Charlton Amor, PA-C   17 g at 11/20/20 1014   potassium chloride (KLOR-CON) packet 20 mEq  20 mEq Oral Daily Raulkar, Drema Pry, MD   20 mEq at 11/20/20 1014   sennosides (SENOKOT) 8.8 MG/5ML syrup 5 mL  5 mL Oral QHS PRN AngiulliMcarthur Rossetti, PA-C   5 mL at 11/14/20 2130   sorbitol 70 % solution 30 mL  30 mL Oral Daily PRN Charlton Amor, PA-C   30 mL at 11/15/20 2124   tamsulosin (FLOMAX) capsule 0.4 mg  0.4 mg Oral QPC supper Horton Chin, MD   0.4 mg at 11/19/20 1716   traZODone (DESYREL) tablet 25 mg  25 mg Oral QHS PRN Charlton Amor, PA-C   25 mg at 11/19/20 2035   Vitamin D (Ergocalciferol) (DRISDOL) capsule 50,000 Units  50,000 Units Oral Q [id number] Horton Chin, MD   50,000 Units at 11/14/20 1756     Discharge Medications: Please see discharge summary for a list of discharge medications.  Relevant Imaging Results:  Relevant Lab Results:   Additional Information SSN: [id number] Has had two COVID vaccines  Jerzy Roepke, Lemar Livings, LCSW

## 2020-11-20 NOTE — Progress Notes (Signed)
Physical Therapy Session Note  Patient Details  Name: Raymond Kerr MRN: [id number] Date of Birth: 1956/02/09  Today's Date: 11/20/2020 PT Individual Time: 1633-1700   27 min   Short Term Goals: Week 4:  PT Short Term Goal 1 (Week 4): STG= LTG based on ELOS  Skilled Therapeutic Interventions/Progress Updates:   Pt received supine in bed no +2 available; Pt performed in bed. Hip/knee flexion/extension 2x 10 BLE. Hip abduction x 10 BLE all performed AAORM/PROM due to poor initiation of BLE movement. Roilling R and L x 3 bil with mod assist to the R and max assis to the L with max cues for sequencing of movement BLE positioning. Pt left supine in bed with pillows to the R side to reduce pressure and prevent skin breakdown at sacrum.         Therapy Documentation Precautions:  Precautions Precautions: Fall Precaution Comments: R hemi, global aphasia Restrictions Weight Bearing Restrictions: No  Vital Signs: Therapy Vitals Temp: 98.9 F (37.2 C) Temp Source: Oral Pulse Rate: 99 Resp: 20 BP: 126/78 Patient Position (if appropriate): Lying Oxygen Therapy SpO2: 100 % O2 Device: Room Air Pain: Pain Assessment Pain Scale: Faces Faces Pain Scale: No hurt   Therapy/Group: Individual Therapy  Golden Pop 11/20/2020, 5:01 PM

## 2020-11-20 NOTE — Progress Notes (Signed)
Occupational Therapy Session Note  Patient Details  Name: Raymond Kerr MRN: [id number] Date of Birth: Apr 27, 1955  Today's Date: 11/20/2020 OT Individual Time: 1005-1105 OT Individual Time Calculation (min): 60 min    Short Term Goals: Week 4:  OT Short Term Goal 1 (Week 4): Pt will don tshirt with mod A. OT Short Term Goal 2 (Week 4): Pt will demonstrate improved sit balance to sit EOB with min A while bathing UB with min a. OT Short Term Goal 3 (Week 4): Caregiver will demonstrate independence with assisting pt during bed mobility and bed level self care. OT Short Term Goal 4 (Week 4): Pt will complete squat pivot with total assist of one in prep for sinkside self care.  Skilled Therapeutic Interventions/Progress Updates:    Pt semi reclined in bed, able to verbally echo therapist's greeting of "Good morning".  Shaking head when asked if he had pain.  While awaiting daughter to arrive for family education, provision of slow prolonged PROM to RUE including shoulder all planes (FF and scaption to 90 degrees and ER to 30 degrees), elbow, wrist, forearm, and digits.  Increased tightness in biceps noted however digits and wrist still have good mobility.  Pts arm placed in gravity eliminated supported position and pt instructed to extend elbow while therapist providing external target of "touch my shoulder".  Pt instructed to observe his RUE while completing motion for increased motor learning and feedback.  Pt extended elbow through partial ROM in gravity eliminated plane consistently for 2 sets x 10 reps.    Daughter arrived and focus of session on caregiver education for bed level mobility and ADL care.  Pt noted with incontinence of bowel therefore instructed daughter on body mechanics and sequencing for clothing management and pericare while assisting pt to roll left and right using bed rails.  Daughter participated throughout however did need max Vcs for follow through of proper cueing of pt and  hand placement during rolling tasks.  Also educated pts daughter on hemi techniques to assist in doffing/donning shirt and pants at bed level.  Pt required total assist to donn pants and doff/donn shirt overhead at bed level.  Direct hand off to SPT.   Therapy Documentation Precautions:  Precautions Precautions: Fall Precaution Comments: R hemi, global aphasia Restrictions Weight Bearing Restrictions: No    Therapy/Group: Individual Therapy  Amie Critchley 11/20/2020, 3:21 PM

## 2020-11-21 LAB — GLUCOSE, CAPILLARY
Glucose-Capillary: 148 mg/dL — ABNORMAL HIGH (ref 70–99)
Glucose-Capillary: 157 mg/dL — ABNORMAL HIGH (ref 70–99)
Glucose-Capillary: 146 mg/dL — ABNORMAL HIGH (ref 70–99)

## 2020-11-21 LAB — CBC
HCT: 38.4 % — ABNORMAL LOW (ref 39.0–52.0)
Hemoglobin: 12.9 g/dL — ABNORMAL LOW (ref 13.0–17.0)
MCH: 28.7 pg (ref 26.0–34.0)
MCHC: 33.6 g/dL (ref 30.0–36.0)
MCV: 85.3 fL (ref 80.0–100.0)
Platelets: 311 10*3/uL (ref 150–400)
RBC: 4.5 MIL/uL (ref 4.22–5.81)
RDW: 14.4 % (ref 11.5–15.5)
WBC: 5.8 10*3/uL (ref 4.0–10.5)
nRBC: 0 % (ref 0.0–0.2)

## 2020-11-21 LAB — HEPATIC FUNCTION PANEL
ALT: 216 U/L — ABNORMAL HIGH (ref 0–44)
AST: 178 U/L — ABNORMAL HIGH (ref 15–41)
Albumin: 3 g/dL — ABNORMAL LOW (ref 3.5–5.0)
Alkaline Phosphatase: 61 U/L (ref 38–126)
Bilirubin, Direct: 0.1 mg/dL (ref 0.0–0.2)
Indirect Bilirubin: 0.7 mg/dL (ref 0.3–0.9)
Total Bilirubin: 0.8 mg/dL (ref 0.3–1.2)
Total Protein: 7 g/dL (ref 6.5–8.1)

## 2020-11-21 MED ORDER — CHOLECALCIFEROL 10 MCG/ML (400 UNIT/ML) PO LIQD
50000.0000 [IU] | ORAL | Status: DC
  Administered 2020-11-21: 50000 [IU] via ORAL
  Filled 2020-11-21 (×2): qty 125

## 2020-11-21 NOTE — Progress Notes (Signed)
Occupational Therapy Session Note  Patient Details  Name: Raymond Kerr MRN: [id number] Date of Birth: 16-Mar-1955  Today's Date: 11/21/2020 OT Individual Time: 1000-1100 OT Individual Time Calculation (min): 60 min    Short Term Goals: Week 3:  OT Short Term Goal 1 (Week 3): Pt will don tshirt with mod A. OT Short Term Goal 1 - Progress (Week 3): Progressing toward goal OT Short Term Goal 2 (Week 3): Pt will demonstrate improved sit balance to sit EOB with min A while bathing UB with min a. OT Short Term Goal 2 - Progress (Week 3): Progressing toward goal OT Short Term Goal 3 (Week 3): Pt will scan plate and initiate self feeding using left hand with supervision. OT Short Term Goal 3 - Progress (Week 3): Met OT Short Term Goal 4 (Week 3): Pt will bathe UB with min assist. OT Short Term Goal 4 - Progress (Week 3): Revised due to lack of progress  Skilled Therapeutic Interventions/Progress Updates:    Pt semi reclined, no visible signs of pain at rest.  Pt completed right roll and sidelying to sit with total assist of one using bed rail.  Pt able to self right to midline and shift left hip forward as needed with multimodal repetitive cues and increased time.  Pt sat EOB with CGA and completed oral hygiene.  Pt needed assist to apply toothpaste and pt did swallow water instead of spitting into tray despite cues provided.  Pt participated in dynamic sitting balance activity to promote forward lean and left leaning/weight shifting to retrieve items off of tray table placed to left of pt.  Pt required CGA throughout.  Pt then stood in stedy with max assist +2 noting significant right push initially and then weight shift migrating posteriorly requiring max assist +2 to correct to midline. Stand to sit on perch then sit<>stand to EOB all with max assist +2. Max assist sit to supine. Call bell in reach, bed alarm on.  Improved processing speed today and command following with increased dynamic sitting  balance and improved midline awareness today.  Therapy Documentation Precautions:  Precautions Precautions: Fall Precaution Comments: R hemi, global aphasia Restrictions Weight Bearing Restrictions: No   Therapy/Group: Individual Therapy  Ezekiel Slocumb 11/21/2020, 12:46 PM

## 2020-11-21 NOTE — Progress Notes (Signed)
Patient ID: Raymond Kerr, male   DOB: 1955/11/08, 65 y.o.   MRN: [id number] Will attempt to see if can find a NH facility to offer pt bed with LOG, for option for daughter. This is not promising though. Most facilities do not take LOG's. Will move forward and order equipment for pt to be delivered to daughter's apartment Monday in anticipation for discharge Tuesday. Continue to feel this is too much care for daughter.

## 2020-11-21 NOTE — Progress Notes (Signed)
Physical Therapy Session Note  Patient Details  Name: Raymond Kerr MRN: [id number] Date of Birth: [birth year]  Today's Date: 11/21/2020 PT Individual Time: 1105-1200 PT Individual Time Calculation (min): 55 min   Short Term Goals: Week 1:  PT Short Term Goal 1 (Week 1): Pt will perform bed mobility with max assist of 1. PT Short Term Goal 1 - Progress (Week 1): Not met PT Short Term Goal 2 (Week 1): Pt will perform sit<>stand with total A of 1. PT Short Term Goal 2 - Progress (Week 1): Not met PT Short Term Goal 3 (Week 1): Pt will initiate gait training PT Short Term Goal 3 - Progress (Week 1): Not met PT Short Term Goal 4 (Week 1): Pt will tolerate sitting in WC >2 hours between therapies PT Short Term Goal 4 - Progress (Week 1): Met  Skilled Therapeutic Interventions/Progress Updates:   Pt initially supine, awake, visually tracks therapist.  No family present for fscheduled Family Training Session.  Improved ability to follow simple commands today. Pt supine to sit on edge of bed w/heavy cueing and mod to max assist w/hob elevated 30**  PT w/mild R lean, initiall;y pushing w/LUE but this quickly dissapates Pt worked on midline orientation/trunk stretching and strengthening via reaching for objexts to L placed at various heights including bedside table, bed, floor and transferring them to surfaces requiring wt shift to L and forwards.   Progressed to slight cross body reach requiring min assist to prevent lob to R w/reachin gto R. Attempted Sit to stand in [place] but pt mumbles unintelligbly w/this and would not engage w/mult attempts to initiate transition. Returned to sitting activity.  Supine to sit w/total assist of 2, scoots in bed total assist of 2 but did attempt to assist by pulling w/LUE on headboard. Pr repositioned to promote RUE/RLE supportdeterennce of contractures/skin protection. . Pt left supine w/rails up x 4 alarm set, bed in lowest position, and needs in  reach.   Therapy Documentation Precautions:  Precautions Precautions: Fall Precaution Comments: R hemi, global aphasia Restrictions Weight Bearing Restrictions: No     Therapy/Group: Individual Therapy Callie Fielding, [place] 11/21/2020, 11:57 AM

## 2020-11-21 NOTE — Progress Notes (Signed)
Speech Language Pathology Discharge Summary  Patient Details  Name: Raymond Kerr MRN: [id number] Date of Birth: [birth year]  Today's Date: 11/24/2020 SLP Individual Time: 4166-0630 SLP Individual Time Calculation (min): 60 min   Skilled Therapeutic Interventions:  Skilled ST services focused on education, swallow and language skills. SLP facilitated PO consumption of dys 3 textures and thin liquids via straw snack, pt demonstrated mild delay in mastication/bolus preparation time, appropriate oral clearance, timely swallow and no overt s/s aspiration. Pt continued to demonstrate inconsistent responses to yes/no question with increase accuracy given visual aids including yes/no communication board, options of choices and demonstrations to clarify response increasing from 30% to 70% accuracy. SLP created new yes/no and 6 cell ADL picture communication boards and placed in personal item bag. SLP provided education to daughter via phone pertaining to current swallow and communication level, use of yes/no questions and multimodal cues. Pt demonstrated increase vocalizations towards the end of the session at the word level "yes", "no" and "more" when expressing wants/needs. Pt was left in room with call bell within reach and chair alarm set. SLP recommends to continue skilled services.    Patient has met 2 of 4 long term goals.  Patient to discharge at overall Max level.  Reasons goals not met: Patient did not meet expressive communciation goal (modA) or word finding goal (modA) as he continues at mod-maxA level for both.   Clinical Impression/Discharge Summary: Patient met 2/4 STG's which had been downgraded previously due to patient's poor overall progress. Unfortunately, patient's CVA resulted in severe-profound impairments in speech, language, cognitive and oropharyngeal swallow function. 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 He is able  to feed self when cup or spoon placed in hand and with tactile cues. He is able to name object photos and pictures when given a partial phrase and/or initial phoneme cue ('read a....', etc) He is able to point to object photos and pictures in field of 3 accurately and although his initiating with pointing has improved, he continues to require verbal and tactile cues to do so. Patient's speech production is less than 15% intelligible when context not known and is not consistently functional in terms of expressing wants/needs. In addition, vocal intensity is low. Head nod and shake for yes/no responses are not consistent enough however patient has been exhibiting more frequent facial expressions, such as smiling, grimacing, etc and these have been more beneficial in terms of non-verbal communication. He is tolerating a Dys 3 solids, thin liquids diet with full supervision, assistance with feeding and encouragement of PO's and does exhibit prolonged mastication. He benefits from oral care after PO intake. Patient would benefit from further SLP intevention at  next venue of care due to his significant impairments, however unfortunately there is no coverage or funding available to provide this. His daughter has been educated regarding his speech, language, cognitive and swallow function.  Care Partner:  Caregiver Able to Provide Assistance: Other (comment) (Patient is a +2 max-total A for physical movements and per daughter she only has occasional extra help available.)  Type of Caregiver Assistance: Physical;Cognitive  Recommendation:  Other (comment) (recommendation would be for SNF but unfortunately patient is uninsured)  Rationale for SLP Follow Up: Maximize functional communication;Maximize cognitive function and independence;Maximize swallowing safety;Reduce caregiver burden   Equipment: None for speech   Reasons for discharge: Discharged from hospital   Patient/Family Agrees with Progress Made and  Goals Achieved: Yes    Jenny Reichmann  Earline Mayotte, MA, CCC-SLP Speech Therapy

## 2020-11-21 NOTE — Progress Notes (Addendum)
PROGRESS NOTE   Subjective/Complaints: Sleepy this morning.   Discussed medical concerns with daughter yesterday.She has been involved in therapy these past couple of days   ROS: Denies pain, +spasticity   Objective:   No results found. Recent Labs    11/21/20 0453  WBC 5.8  HGB 12.9*  HCT 38.4*  PLT 311     No results for input(s): NA, K, CL, CO2, GLUCOSE, BUN, CREATININE, CALCIUM in the last 72 hours.     Intake/Output Summary (Last 24 hours) at 11/21/2020 0856 Last data filed at 11/20/2020 1815 Gross per 24 hour  Intake 60 ml  Output --  Net 60 ml        Physical Exam: Vital Signs Blood pressure 134/83, pulse 94, temperature 98.1 F (36.7 C), temperature source Oral, resp. rate 18, height 6' (1.829 m), weight 109.9 kg, SpO2 99 %. Gen: no distress, normal appearing HEENT: oral mucosa pink and moist, NCAT Cardio: Reg rate Chest: normal effort, normal rate of breathing Abd: soft, non-distended Ext: no edema Psych: pleasant, normal affect Skin: intact Neuro: Alert, right central 7. Has a hard time keeping eyes open. Leans to right.  Global aphasia Makes eye contact, engages temporarily, not following commands No resting tone  Assessment/Plan: 1. Functional deficits which require 3+ hours per day of interdisciplinary therapy in a comprehensive inpatient rehab setting. Physiatrist is providing close team supervision and 24 hour management of active medical problems listed below. Physiatrist and rehab team continue to assess barriers to discharge/monitor patient progress toward functional and medical goals  Care Tool:  Bathing    Body parts bathed by patient: Face, Right arm, Chest, Abdomen, Front perineal area   Body parts bathed by helper: Buttocks, Right upper leg, Left upper leg, Left lower leg, Right lower leg, Left arm     Bathing assist Assist Level: Maximal Assistance - Patient 24 - 49%      Upper Body Dressing/Undressing Upper body dressing   What is the patient wearing?: Pull over shirt    Upper body assist Assist Level: Moderate Assistance - Patient 50 - 74%    Lower Body Dressing/Undressing Lower body dressing      What is the patient wearing?: Pants, Incontinence brief     Lower body assist Assist for lower body dressing: Total Assistance - Patient < 25% (bed level)     Toileting Toileting    Toileting assist Assist for toileting: 2 Helpers     Transfers Chair/bed transfer  Transfers assist  Chair/bed transfer activity did not occur: Safety/medical concerns  Chair/bed transfer assist level: 2 Helpers Building services engineer)     Locomotion Ambulation   Ambulation assist   Ambulation activity did not occur: Safety/medical concerns          Walk 10 feet activity   Assist  Walk 10 feet activity did not occur: Safety/medical concerns        Walk 50 feet activity   Assist Walk 50 feet with 2 turns activity did not occur: Safety/medical concerns         Walk 150 feet activity   Assist Walk 150 feet activity did not occur: Safety/medical concerns  Walk 10 feet on uneven surface  activity   Assist Walk 10 feet on uneven surfaces activity did not occur: Safety/medical concerns         Wheelchair     Assist Is the patient using a wheelchair?:  (yes, per PT note) Type of Wheelchair:  (manual per PT note) Wheelchair activity did not occur: Safety/medical concerns         Wheelchair 50 feet with 2 turns activity    Assist    Wheelchair 50 feet with 2 turns activity did not occur: Safety/medical concerns       Wheelchair 150 feet activity     Assist  Wheelchair 150 feet activity did not occur: Safety/medical concerns       Blood pressure 134/83, pulse 94, temperature 98.1 F (36.7 C), temperature source Oral, resp. rate 18, height 6' (1.829 m), weight 109.9 kg, SpO2 99 %.    Medical Problem  List and Plan: 1.  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 2.  Impaired mobility -DVT/anticoagulation:  Pharmaceutical: Continue Lovenox             -antiplatelet therapy: Aspirin 81 mg daily and Plavix 75 mg daily x90 days then aspirin alone 3. Pain Management: Tylenol as needed 4. Mood: Provide emotional support             -antipsychotic agents: N/A 5. Neuropsych: This patient is not capable of making decisions on his own behalf. 6. Skin/Wound Care: Routine skin checks 7. Fluids/Electrolytes/Nutrition: Routine in and outs, nsg to push fluids, hopefully megace will help as well  8.  Post stroke dysphagia.  Mechanical soft thin liquids..  Patient limited p.o. intake.  Eating better, continue megace 9.  Hyperlipidemia. Continue Lipitor. LDL reviewed- 124.  10. Uncontrolled Diabetes mellitus with hyperglycemia.  Hemoglobin A1c 10.1.  CBGs 143-171: Semglee 10 units daily.  Diabetic teaching. Maintain metformin to 500mg  BID- will not increase further given nausea with higher dose. Monitor creatinine. Discontinue ISS.  Recent Labs    11/20/20 1156 11/20/20 1706 11/20/20 2143  GLUCAP 130* 172* 160*  Decrease Amaryl to 2mg . Maintain this dose for now 11.  Obesity.  BMI 32.86.  Dietary follow-up. Discontinue feeding supplement- try to promote regular foods. 12. Sub-optimal potassium: start 11/22/20 daily supplement. Continue supplement.  13. Right arm and leg spasticity:    -continue splinting, ROM  -maintain off baclofen given lethargy and much improved tone 14. Lethargy: d/c Baclofen and increase Ritalin to 10mg  BID 15. Transaminitis: d/c tylenol and monitor LFTs weekly. Still rising, discussed with pharmacy and statin may contribute weakly, will maintain for now given  stroke risk and repeat next week. Liver ultrasound ordered and suggestive of cirrhosis. Will schedule for GI follow-up.  16. Impaired initiation: good response to Ritalin-increase Ritalin to 10mg  BID.   -dc baclofen 17.  Essential hypertension: increase Norvasc to 10mg  Vitals:   11/20/20 2047 11/21/20 0606  BP: 134/83 134/83  Pulse: 99 94  Resp: 18 18  Temp: 97.8 F (36.6 C) 98.1 F (36.7 C)  SpO2: 98% 99%   18.  Constipation: continue suppository PRN and Miralax BID.  19. Tachycardia: EKG shows sinus tachycardia: continue to monitor. 20. Urinary retention: start flomax 0.4mg  HS 21. Disposition: d/c 10/4 (32 days total). F/u with me  on 10/21. Reaching out to family to let them know he will need 2 person support as well as Nurse, adult. Continue family training   LOS: 28 days A FACE TO FACE EVALUATION WAS PERFORMED  Drema Pry Paisleigh Maroney 11/21/2020, 8:56 AM

## 2020-11-21 NOTE — Progress Notes (Signed)
Occupational Therapy Session Note  Patient Details  Name: Raymond Kerr MRN: [id number] Date of Birth: Nov 20, 1955  Today's Date: 11/21/2020 OT Individual Time: 4098-1191 OT Individual Time Calculation (min): 44 min    Short Term Goals: Week 4:  OT Short Term Goal 1 (Week 4): Pt will don tshirt with mod A. OT Short Term Goal 2 (Week 4): Pt will demonstrate improved sit balance to sit EOB with min A while bathing UB with min a. OT Short Term Goal 3 (Week 4): Caregiver will demonstrate independence with assisting pt during bed mobility and bed level self care. OT Short Term Goal 4 (Week 4): Pt will complete squat pivot with total assist of one in prep for sinkside self care.  Skilled Therapeutic Interventions/Progress Updates:  Pt greeted supine in bed nodding "yes" when asked if pt agreeable to OT intervention. Session focus on PROM to RUE and self care reeducation.PROM provided to shoulder (90* flexion only) elbow, wrist and hand. Pts arm placed in gravity eliminated  position with elbow and wrist supported while sat upright in bed. pt instructed to extend elbow towards OTA shoulder, noted some trace movement and initial initiation however pt required assist to come into full ROM.   Additionally worked on self care reeducation with pt washing face from bed level with set- up assist with LUE and completing oral care with set- up assist. Applied lip moisturizer to pts lips. Pt left supine in bed with RUE supported on pillow, bed alarm activated and all neds within reach.                                  Therapy Documentation Precautions:  Precautions Precautions: Fall Precaution Comments: R hemi, global aphasia Restrictions Weight Bearing Restrictions: No  Pain: pt grimaces to pain when completing PROM to R elbow, decreased ROM and provided rest breaks and repositioning as needed.     Therapy/Group: Individual Therapy  Pollyann Glen [facility] 11/21/2020, 11:00 AM

## 2020-11-21 NOTE — Progress Notes (Signed)
Speech Language Pathology Daily Session Note  Patient Details  Name: Raymond Kerr MRN: [id number] Date of Birth: 01-Jun-1955  Today's Date: 11/21/2020 SLP Individual Time: 7628-3151 SLP Individual Time Calculation (min): 50 min  Short Term Goals: Week 4: SLP Short Term Goal 1 (Week 4): STG=LTG (anticipated discharge 10/1)  Skilled Therapeutic Interventions:   Patient seen with daughter and grandson present for portion of session for skilled ST. Patient was alert but not as responsive as he had been this week and seemed to be indicating some discomfort of his stomach. SLP setup oral care materials and patient completed with min-modA verbal, visual, tactile cues to initiate and maintain adequate performance. Patient required max frequency and modA intensity of verbal and tactile cues to initiate pointing and he would immediately put his hand back at side after each set. He was able to point to object photos in field of 3 with 75% accuracy overall. He was able to match color pegs and place in peg board after SLP modeling and mod-maxA cues for less than 50% accuracy. SLP discussed with daughter difficulty in determining exactly patient's immediate needs/wants and feelings but that he has been more expressive with facial expressions and vocalizations/verbalizations. Patient continues to benefit from skilled SLP intervention to maximize cognitive-linguistic and swallow function prior to discharge.  Pain Pain Assessment Pain Scale: Faces Faces Pain Scale: Hurts a little bit Pain Type: Acute pain;Other (Comment) (seemed to be indicating some abdominal pain but unsure secondary to severe aphasia) Pain Location: Abdomen Pain Onset: On-going Pain Intervention(s): Other (Comment) (monitored during session) Multiple Pain Sites: No  Therapy/Group: Individual Therapy  Angela Nevin, MA, CCC-SLP Speech Therapy

## 2020-11-22 LAB — GLUCOSE, CAPILLARY
Glucose-Capillary: 111 mg/dL — ABNORMAL HIGH (ref 70–99)
Glucose-Capillary: 147 mg/dL — ABNORMAL HIGH (ref 70–99)
Glucose-Capillary: 159 mg/dL — ABNORMAL HIGH (ref 70–99)
Glucose-Capillary: 148 mg/dL — ABNORMAL HIGH (ref 70–99)

## 2020-11-22 NOTE — Progress Notes (Signed)
Physical Therapy Session Note  Patient Details  Name: Raymond Kerr MRN: [id number] Date of Birth: [birth year]  Today's Date: 11/22/2020 PT Individual Time: 1105-1205 PT Individual Time Calculation (min): 60 min   Short Term Goals:  Week 4:  PT Short Term Goal 1 (Week 4): STG= LTG based on ELOS   Skilled Therapeutic Interventions/Progress Updates:   Pt received supine in bed and agreeable to PT. Supine>sit transfer with max assist for LUE supported on PT shoulder. Mod assist initially to maintain balance sitting EOB progressing to supervision A*. 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 Daughter then present for family education.  +2 assist with PT and daughter to perform standing EOB with LUE supported in arm chair in front of patient. PT educated daughter on effects of CVA causing pushers syndrome and explained why pt should not stand with fmaily at this time for safety. In standing Daughter assisted with donning pants to waist. SB transfer to [facility] with +2 assist from PT and daughter. Education provided for safe transfer technique, but then encouraged daughter to only perform with trained professional present at this time. Daughter reports that medicare/medicaid will be starting soon PT encouraged daughter to obtain TIS WC througth [facility] evaluation and hoyer lift for safe transfers in and out of bed as well as contacting area transit to set up transportation as soon as WC is available. Pt left with daughter sitting in [facility] and all needs met.      Therapy Documentation Precautions:  Precautions Precautions: Fall Precaution Comments: R hemi, global aphasia Restrictions Weight Bearing Restrictions: No    Vital Signs: Therapy Vitals Temp: 98.3 F (36.8 C) Temp Source: Oral Pulse Rate: (!) 102 Resp: 14 BP: 134/79 Patient Position (if appropriate): Sitting Oxygen Therapy SpO2: 100  % O2 Device: Room Air Pain: Pain Assessment Pain Scale: Faces Faces Pain Scale: No hurt   Therapy/Group: Individual Therapy  Lorie Phenix 11/22/2020, 1:04 PM

## 2020-11-22 NOTE — Progress Notes (Signed)
Speech Language Pathology Daily Session Note  Patient Details  Name: Raymond Kerr MRN: [id number] Date of Birth: 08-31-1955  Today's Date: 11/22/2020 SLP Individual Time: 0903-1000 SLP Individual Time Calculation (min): 57 min  Short Term Goals: Week 4: SLP Short Term Goal 1 (Week 4): STG=LTG (anticipated discharge 10/1)  Skilled Therapeutic Interventions: Pt seen for skilled ST with focus on speech and swallowing goals, session meant for family education with daughter present for final 5 minutes of session. Pt seen toward end of AM meal with NT, SLP taking over to assist with feeding and supervision. NT states pt with limited appetite this morning, had a "rough night". Pt consuming minimal Dys 3 solids with extended mastication and mild oral stasis after swallow. Max A cues required for effective lingual sweep (pt does appear to have sensation of oral residuals), benefits from liquid wash to clear fully. Pt with no overt s/s aspiration, however oral phase more delayed today than previous tx sessions likely 2' fatigue. SLP utilizing dry erase board with 2 cell and 4 cell written words to increase pt communication of wants and needs, pt denying pain or discomfort, pointing to water in field of 4 drink choices. Verbalizations continue to be inconsistent and unintelligible.  At end of session, SLP continued discussion with daughter re: Dys 3 diet recommendations, safe swallow strategies, proper positioning for PO intake and multimodal communication strategies. Dys 3 diet handout located in room and pinned to board above bed for daughter to take home at discharge. Daughter engaged in education with verbalized understanding and no questions at this time. Pt left in bed with alarm set and daughter present. Cont ST POC.   Pain Pain Assessment Pain Scale: Faces Faces Pain Scale: No hurt  Therapy/Group: Individual Therapy  Tacey Ruiz 11/22/2020, 9:45 AM

## 2020-11-22 NOTE — Progress Notes (Signed)
Occupational Therapy Session Note  Patient Details  Name: Raymond Kerr MRN: [id number] Date of Birth: 06/30/1955  Today's Date: 11/22/2020 OT Individual Time: 1000-1055 OT Individual Time Calculation (min): 55 min    Short Term Goals: Week 4:  OT Short Term Goal 1 (Week 4): Pt will don tshirt with mod A. OT Short Term Goal 2 (Week 4): Pt will demonstrate improved sit balance to sit EOB with min A while bathing UB with min a. OT Short Term Goal 3 (Week 4): Caregiver will demonstrate independence with assisting pt during bed mobility and bed level self care. OT Short Term Goal 4 (Week 4): Pt will complete squat pivot with total assist of one in prep for sinkside self care.  Skilled Therapeutic Interventions/Progress Updates:  Pt greeted supine in bed with daughter present for family education session. Attempted to have pt write with L hand with pt unable to write legible words. Education provided to daughter on recommendation for dressing to be completed from bed level at home as well as bathing. Provided education on compensatory methods for bathing and dressing from bed level as well s rolling technique using bed pad. Pts daughter reports they are not getting a lift or w/c therefore education provided on repositioning pt every hour and provided daughter with handout on positioning for CVA pts. Went over RUE PROM HEP with daughter victoria. [name] was able to return demo technique with good carryover. Additionally went over positioning of RUE on pillow while pt at rest, as well as how to don <>doff resting hand splint. Education provided on importance of trying to establish a routine at home to decrease caregiver burden.[name] with no further questions. pt left supine in bed with bed alarm activated and all needs within reach.                 Therapy Documentation Precautions:  Precautions Precautions: Fall Precaution Comments: R hemi, global aphasia Restrictions Weight Bearing  Restrictions: No  Pain: no s/s of pain during session     Therapy/Group: Individual Therapy  Barron Schmid 11/22/2020, 12:10 PM

## 2020-11-23 DIAGNOSIS — I6381 Other cerebral infarction due to occlusion or stenosis of small artery: Secondary | ICD-10-CM

## 2020-11-23 LAB — GLUCOSE, CAPILLARY
Glucose-Capillary: 130 mg/dL — ABNORMAL HIGH (ref 70–99)
Glucose-Capillary: 143 mg/dL — ABNORMAL HIGH (ref 70–99)
Glucose-Capillary: 143 mg/dL — ABNORMAL HIGH (ref 70–99)
Glucose-Capillary: 186 mg/dL — ABNORMAL HIGH (ref 70–99)

## 2020-11-23 NOTE — Discharge Summary (Signed)
Physician Discharge Summary  Patient ID: Raymond Kerr MRN: [id number] DOB/AGE: Dec 16, 1955 65 y.o.  Admit date: 10/24/2020 Discharge date: 11/25/2020  Discharge Diagnoses:  Principal Problem:   Left thalamic infarction [facility]) Active Problems:   Essential hypertension   Spastic hemiparesis (HCC)   Uncontrolled type 2 diabetes mellitus with hyperglycemia (HCC) DVT prophylaxis Hyperlipidemia Post stroke dysphagia Obesity  Discharged Condition: Stable  Significant Diagnostic Studies: CT HEAD WO CONTRAST ( )  Result Date: 11/03/2020 CLINICAL DATA:  Altered mental status EXAM: CT HEAD WITHOUT CONTRAST TECHNIQUE: Contiguous axial images were obtained from the base of the skull through the vertex without intravenous contrast. COMPARISON:  Brain MRI 10/09/2020, CT head 10/10/2020 FINDINGS: Brain: There is hypodensity in the left cerebral peduncle, thalamus, periventricular white matter, and paracentral lobule region consistent with evolving infarcts as seen on the prior brain MRI. There are remote infarcts in the right frontal lobe and left cerebellar hemisphere. There is no evidence of acute intracranial hemorrhage, extra-axial fluid collection, or new infarct. The ventricles are stable in size, with unchanged mild ex vacuo dilatation of the right lateral ventricle. There is no mass lesion. There is no midline shift. Vascular: There is calcification of the bilateral cavernous ICAs. Skull: Normal. Negative for fracture or focal lesion. Sinuses/Orbits: The paranasal sinuses are clear. The globes and orbits are unremarkable. Other: None. IMPRESSION: 1. 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 2. No new infarct or hemorrhage. 3. Remote infarcts in the right frontal lobe and left cerebellar hemisphere, unchanged. Electronically Signed   By: Lesia Hausen M.D.   On: 11/03/2020 16:16   US Abdomen Limited RUQ  (LIVER/GB)  Result Date: 11/14/2020 CLINICAL DATA:  Elevated LFTs EXAM: ULTRASOUND ABDOMEN LIMITED RIGHT UPPER QUADRANT COMPARISON:  None. FINDINGS: Gallbladder: No gallstones or wall thickening visualized. No sonographic Murphy sign noted by sonographer. Common bile duct: Diameter: 4 mm Liver: No focal lesion identified. Heterogeneous liver echotexture with nodular surface contour. Portal vein is patent on color Doppler imaging with normal direction of blood flow towards the liver. Other: None. Technical note: Examination is limited secondary to poor penetration related to patient body habitus. IMPRESSION: 1. Heterogeneous liver echotexture with nodular surface contour. Sonographic appearance suggestive of cirrhosis. No focal liver lesion is identified. 2. Unremarkable gallbladder. Electronically Signed   By: Duanne Guess D.O.   On: 11/14/2020 17:52    Labs:  Basic Metabolic Panel: No results for input(s): NA, K, CL, CO2, GLUCOSE, BUN, CREATININE, CALCIUM, MG, PHOS in the last 168 hours.  CBC: Recent Labs  Lab 11/21/20 0453  WBC 5.8  HGB 12.9*  HCT 38.4*  MCV 85.3  PLT 311    CBG: Recent Labs  Lab 11/23/20 1652 11/23/20 2100 11/24/20 1119 11/24/20 1647 11/24/20 2112  GLUCAP 143* 186* 151* 180* 140*   Family history.  Negative for prostate cancer bladder cancer or kidney cancer  Brief HPI:   Raymond Kerr is a 65 y.o. right-handed male with history of hypertension diabetes mellitus as well as hyperlipidemia.  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  He has a daughter in the area.  Presented 10/06/2020 with dysarthria diplopia incoordination and right facial droop progressing over 2 days.  Cranial CT scan showed low-density area within the left thalamus and right frontal lobe compatible with subacute to chronic infarction.  No intracranial hemorrhage.  MRI/MRA small acute infarcts of the left paramedian frontal lobe and ventral medial left thalamus.  Short  segment occlusion of the left ACA A2 segment.  Moderate narrowing of the right ACA proximal A3 segment.  Patient did not receive tPA.  Echocardiogram with ejection fraction of 40 to 45% left ventricle demonstrated regional wall motion abnormality as well as some septal hypofunction.  Admission chemistries unremarkable except glucose 260 alcohol negative troponin 62-68 hemoglobin A1c 10.1.  Cardiology services did follow-up to address elevated troponin.  EKG normal sinus rhythm with septal infarct age undetermined.  TEE completed 10/08/2020 ejection fraction of 50 to 55% right ventricular systolic function was normal mild grade 2 layered and protruding plaque.  No atrial level shunt detected by color-flow Doppler.  Patient had been placed on dual antiplatelet therapy for CVA advised to continue and monitor no further cardiac diagnostic at this time.  Subcutaneous Lovenox for DVT prophylaxis.  Hospital course 3 days after his admission he developed increasing aphasia and right side weakness.  Repeat MRI revealed extension of both his left frontal and brainstem infarcts as well as new watershed infarct in the internal border zone on the left.  EEG was completed showing no seizure activity.  Neurology advised to continue aspirin and Plavix x90 days given severe intracranial stenosis followed by aspirin 81 mg thereafter.  He was maintained on mechanical soft diet concerns for aspiration pneumonia there was some discussion of possible need for PEG tube which family adamantly refused.  Therapy evaluations completed due to patient decreased functional mobility was admitted for a comprehensive rehab program.   Hospital Course: Raymond Kerr was admitted to rehab 10/24/2020 for inpatient therapies to consist of PT, ST and OT at least three hours five days a week. Past admission physiatrist, therapy team and rehab RN have worked together to provide customized collaborative inpatient rehab.  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  Subcutaneous Lovenox for DVT prophylaxis no bleeding episodes.  Ritalin had been initiated to help patient sustained better attention and focus to task.  Blood pressure monitored on Norvasc.  Post stroke dysphagia mechanical soft intake had improved.  Lipitor ongoing for hyperlipidemia.  Uncontrolled diabetes mellitus hemoglobin A1c 10.1 maintained on Glucophage as well as Amaryl 2 mg daily.  Noted obesity BMI 32.86.  Dietary follow-up.   Blood pressures were monitored on TID basis and soft and monitored  Diabetes has been monitored with ac/hs CBG checks and SSI was use prn for tighter BS control.    Rehab course: During patient's stay in rehab weekly team conferences were held to monitor patient's progress, set goals and discuss barriers to discharge. At admission, patient required max assist stand pivot transfers max assist squat pivot transfers max assist sit to supine  Physical exam.  Blood pressure 131/78 pulse 75 temperature 98.2 respirations 17 oxygen saturation 99% room air Constitutional.  No acute distress HEENT Head.  Left eye ptosis .  Eyes.  Reactive to light no nystagmus Neck.  Supple nontender no JVD without thyromegaly Cardiac regular rate and rhythm any extra sounds or murmur heard Abdomen.  Soft nontender positive bowel sounds without rebound Respiratory effort normal no respiratory distress without wheeze Skin.  Intact Neurologic.  Alert expressive aphasia.  He does provide some yes no responses.  He does mimic some motor commands.  He/She  has had improvement in activity tolerance, balance, postural control as well as ability to compensate for deficits. He/She has had improvement  in functional use RUE/LUE  and RLE/LLE as well as improvement in awareness.  Supine to sit transfers max assist for  left upper extremity support.  Moderate assist initiated to maintain balance.  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  Daughter ongoing for family education.  Plus to assist with physical therapy and daughter to perform standing at edge of bed with left upper extremity supported an arm chair in front of patient.  Physical therapy educated daughter on effects of CVA causing pusher syndrome and explained why patient should not stand with family for safety unless assisted.  Daughter assisted with donning pants to waist.  Slide board transfers to wheelchair at plus to assist from physical therapy and daughter.  Education provided for safe transfer technique encouraged daughter only to perform when trained professional present.  ADLs daughter focus with physical therapy for ADL care.  Daughter participated throughout sessions needing some cues for proper cueing and placement during rolling task.  Patient required total assist to don pants and doff don shirt overhead at bed level.  Speech therapy follow-up education on mechanical soft diet.  Full family teaching completed plan discharged to home       Disposition: Discharge to home   Diet: Mechanical soft  Special Instructions: No driving smoking or alcohol  Medications at discharge 1.  Norvasc 10 mg daily 2.  Aspirin 81 mg daily 3.  Lipitor 80 mg p.o. daily 4.  B complex with vitamin C daily 5.  Vitamin D 50,000 units weekly 6.  Plavix 75 mg daily 7.  Amaryl 2 mg daily 8.  Glucophage 500 mg twice daily 9.  Ritalin 10 mg twice daily 10.  MiraLAX twice daily hold for loose stools 11.  Flomax 0.4 mg daily 12.  Trazodone 25 mg nightly as needed sleep  30-35 minutes spent completing discharge summary and discharge planning  Discharge Instructions     Ambulatory referral to Gastroenterology   Complete by: As  directed    Evaluation for cirrhosis identified on ultrasound of the abdomen.   Ambulatory referral to Neurology   Complete by: As directed    An appointment is requested in approximately: 4 weeks left thalamic frontal infarction   Ambulatory referral to Physical Medicine Rehab   Complete by: As directed    Moderate complexity follow-up 1 to 2 weeks left thalamic infarction        Follow-up Information     Raulkar, Drema Pry, MD Follow up.   Specialty: Physical Medicine and Rehabilitation Why: 12/12/20 Please arrive at 1:20pm for 1:40pm appointment, thank you! Contact information: [address] [place] [address] [phone]                 Signed: Charlton Amor 11/25/2020, 5:49 AM

## 2020-11-23 NOTE — Progress Notes (Signed)
PROGRESS NOTE   Subjective/Complaints:  Pt trying to say something- sounds like "bam, bam".  Denies any issues.  ROS: limited by cognition/aphasia   Objective:   No results found. Recent Labs    11/21/20 0453  WBC 5.8  HGB 12.9*  HCT 38.4*  PLT 311     No results for input(s): NA, K, CL, CO2, GLUCOSE, BUN, CREATININE, CALCIUM in the last 72 hours.     Intake/Output Summary (Last 24 hours) at 11/23/2020 1057 Last data filed at 11/23/2020 0837 Gross per 24 hour  Intake 120 ml  Output --  Net 120 ml        Physical Exam: Vital Signs Blood pressure 133/87, pulse 89, temperature 98.1 F (36.7 C), temperature source Oral, resp. rate 16, height 6' (1.829 m), weight 109.9 kg, SpO2 100 %.   General: awake, alert, appropriate, trying to speak; but also does nod head yes and no;  NAD HENT: conjugate gaze; oropharynx moist CV: regular rate; no JVD Pulmonary: CTA B/L; no W/R/R- good air movement GI: soft, NT, ND, (+)BS Psychiatric: appropriate; cannot understand Neurological: aphasic  Neuro: Alert, right central 7. Has a hard time keeping eyes open. Leans to right.  Global aphasia Makes eye contact, engages temporarily, not following commands No resting tone  Assessment/Plan: 1. Functional deficits which require 3+ hours per day of interdisciplinary therapy in a comprehensive inpatient rehab setting. Physiatrist is providing close team supervision and 24 hour management of active medical problems listed below. Physiatrist and rehab team continue to assess barriers to discharge/monitor patient progress toward functional and medical goals  Care Tool:  Bathing    Body parts bathed by patient: Face, Right arm, Chest, Abdomen, Front perineal area   Body parts bathed by helper: Buttocks, Right upper leg, Left upper leg, Left lower leg, Right lower leg, Left arm     Bathing assist Assist Level: Maximal Assistance  - Patient 24 - 49%     Upper Body Dressing/Undressing Upper body dressing   What is the patient wearing?: Pull over shirt    Upper body assist Assist Level: Moderate Assistance - Patient 50 - 74%    Lower Body Dressing/Undressing Lower body dressing      What is the patient wearing?: Pants, Incontinence brief     Lower body assist Assist for lower body dressing: Total Assistance - Patient < 25% (bed level)     Toileting Toileting    Toileting assist Assist for toileting: 2 Helpers     Transfers Chair/bed transfer  Transfers assist  Chair/bed transfer activity did not occur: Safety/medical concerns  Chair/bed transfer assist level: 2 Helpers Building services engineer)     Locomotion Ambulation   Ambulation assist   Ambulation activity did not occur: Safety/medical concerns          Walk 10 feet activity   Assist  Walk 10 feet activity did not occur: Safety/medical concerns        Walk 50 feet activity   Assist Walk 50 feet with 2 turns activity did not occur: Safety/medical concerns         Walk 150 feet activity   Assist Walk 150 feet activity did not  occur: Safety/medical concerns         Walk 10 feet on uneven surface  activity   Assist Walk 10 feet on uneven surfaces activity did not occur: Safety/medical concerns         Wheelchair     Assist Is the patient using a wheelchair?:  (yes, per PT note) Type of Wheelchair:  (manual per PT note) Wheelchair activity did not occur: Safety/medical concerns         Wheelchair 50 feet with 2 turns activity    Assist    Wheelchair 50 feet with 2 turns activity did not occur: Safety/medical concerns       Wheelchair 150 feet activity     Assist  Wheelchair 150 feet activity did not occur: Safety/medical concerns       Blood pressure 133/87, pulse 89, temperature 98.1 F (36.7 C), temperature source Oral, resp. rate 16, height 6' (1.829 m), weight 109.9 kg, SpO2 100  %.    Medical Problem List and Plan: 1.  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 2.  Impaired mobility -DVT/anticoagulation:  Pharmaceutical: Continue Lovenox             -antiplatelet therapy: Aspirin 81 mg daily and Plavix 75 mg daily x90 days then aspirin alone 3. Pain Management: Tylenol as needed 4. Mood: Provide emotional support             -antipsychotic agents: N/A 5. Neuropsych: This patient is not capable of making decisions on his own behalf. 6. Skin/Wound Care: Routine skin checks 7. Fluids/Electrolytes/Nutrition: Routine in and outs, nsg to push fluids, hopefully megace will help as well  8.  Post stroke dysphagia.  Mechanical soft thin liquids..  Patient limited p.o. intake.  Eating better, continue megace 9.  Hyperlipidemia. Continue Lipitor. LDL reviewed- 124.  10. Uncontrolled Diabetes mellitus with hyperglycemia.  Hemoglobin A1c 10.1.  CBGs 143-171: Semglee 10 units daily.  Diabetic teaching. Maintain metformin to 500mg  BID- will not increase further given nausea with higher dose. Monitor creatinine. Discontinue ISS.  Recent Labs    11/22/20 1646 11/22/20 2046 11/23/20 0615  GLUCAP 159* 148* 130*  Decrease Amaryl to 2mg . Maintain this dose for now  10/2- BG's adequate- con't regimen 11.  Obesity.  BMI 32.86.  Dietary follow-up. Discontinue feeding supplement- try to promote regular foods. 12. Sub-optimal potassium: start daily supplement. Continue supplement.  13. Right arm and leg spasticity:    -continue splinting, ROM  -maintain off baclofen given lethargy and much improved tone 14. Lethargy: d/c Baclofen and increase Ritalin to 10mg  BID 15. Transaminitis: d/c tylenol and monitor LFTs weekly. Still rising, discussed with pharmacy and statin may contribute  weakly, will maintain for now given stroke risk and repeat next week. Liver ultrasound ordered and suggestive of cirrhosis. Will schedule for GI follow-up.  16. Impaired initiation: good response to Ritalin-increase Ritalin to 10mg  BID.   -dc baclofen 17.  Essential hypertension: increase Norvasc to 10mg  Vitals:   11/22/20 1951 11/23/20 0613  BP: 122/72 133/87  Pulse: (!) 105 89  Resp: 16 16  Temp: 98.5 F (36.9 C) 98.1 F (36.7 C)  SpO2: 98% 100%   10/2- BP controlled- cont'  regimen 18.  Constipation: continue suppository PRN and Miralax BID.  19. Tachycardia: EKG shows sinus  tachycardia: continue to monitor. 20. Urinary retention: start flomax 0.4mg  HS 21. Disposition: d/c 10/4 (32 days total). F/u with me on 10/21. Reaching out to family to let them know he will need 2 person support as well as Nurse, adult. Continue family training   LOS: 30 days A FACE TO FACE EVALUATION WAS PERFORMED  Raymond Kerr 11/23/2020, 10:57 AM

## 2020-11-24 ENCOUNTER — Other Ambulatory Visit (HOSPITAL_COMMUNITY): Payer: Self-pay

## 2020-11-24 LAB — GLUCOSE, CAPILLARY
Glucose-Capillary: 151 mg/dL — ABNORMAL HIGH (ref 70–99)
Glucose-Capillary: 180 mg/dL — ABNORMAL HIGH (ref 70–99)
Glucose-Capillary: 140 mg/dL — ABNORMAL HIGH (ref 70–99)

## 2020-11-24 MED ORDER — TAMSULOSIN HCL 0.4 MG PO CAPS
0.4000 mg | ORAL_CAPSULE | Freq: Every day | ORAL | 0 refills | Status: AC
  Filled 2020-11-24: qty 30, 30d supply, fill #0

## 2020-11-24 MED ORDER — INSULIN PEN NEEDLE 32G X 4 MM MISC
0 refills | Status: DC
  Filled 2020-11-24: qty 100, 30d supply, fill #0

## 2020-11-24 MED ORDER — METHYLPHENIDATE HCL 10 MG PO TABS
10.0000 mg | ORAL_TABLET | Freq: Two times a day (BID) | ORAL | 0 refills | Status: AC
  Filled 2020-11-24: qty 60, 30d supply, fill #0

## 2020-11-24 MED ORDER — VITAMIN D (ERGOCALCIFEROL) 1.25 MG (50000 UNIT) PO CAPS
50000.0000 [IU] | ORAL_CAPSULE | ORAL | 0 refills | Status: DC
  Filled 2020-11-24: qty 5, 34d supply, fill #0

## 2020-11-24 MED ORDER — BLOOD GLUCOSE MONITOR SYSTEM W/DEVICE KIT
PACK | 0 refills | Status: AC
  Filled 2020-11-24: qty 1, 30d supply, fill #0

## 2020-11-24 MED ORDER — ATORVASTATIN CALCIUM 80 MG PO TABS
80.0000 mg | ORAL_TABLET | Freq: Every day | ORAL | 0 refills | Status: AC
  Filled 2020-11-24: qty 30, 30d supply, fill #0

## 2020-11-24 MED ORDER — BLOOD GLUCOSE METER KIT: PACK | 0 refills | Status: AC

## 2020-11-24 MED ORDER — POLYETHYLENE GLYCOL 3350 17 G PO PACK: 17.0000 g | PACK | Freq: Two times a day (BID) | ORAL | 0 refills | Status: DC

## 2020-11-24 MED ORDER — ACCU-CHEK SOFTCLIX LANCETS MISC
0 refills | Status: DC
  Filled 2020-11-24: qty 100, 25d supply, fill #0

## 2020-11-24 MED ORDER — VITAMIN B COMPLEX PO TABS
1.0000 | ORAL_TABLET | Freq: Every day | ORAL | 0 refills | Status: AC
  Filled 2020-11-24: qty 30, 30d supply, fill #0

## 2020-11-24 MED ORDER — GLIMEPIRIDE 2 MG PO TABS
2.0000 mg | ORAL_TABLET | Freq: Every day | ORAL | 0 refills | Status: AC
  Filled 2020-11-24: qty 30, 30d supply, fill #0

## 2020-11-24 MED ORDER — GLUCOSE BLOOD VI STRP
ORAL_STRIP | 0 refills | Status: DC
  Filled 2020-11-24: qty 100, 30d supply, fill #0

## 2020-11-24 MED ORDER — CLOPIDOGREL BISULFATE 75 MG PO TABS
75.0000 mg | ORAL_TABLET | Freq: Every day | ORAL | 1 refills | Status: AC
  Filled 2020-11-24: qty 30, 30d supply, fill #0

## 2020-11-24 MED ORDER — AMLODIPINE BESYLATE 10 MG PO TABS
10.0000 mg | ORAL_TABLET | Freq: Every day | ORAL | 0 refills | Status: DC
  Filled 2020-11-24: qty 30, 30d supply, fill #0

## 2020-11-24 MED ORDER — METFORMIN HCL 500 MG PO TABS
500.0000 mg | ORAL_TABLET | Freq: Two times a day (BID) | ORAL | 0 refills | Status: AC
  Filled 2020-11-24: qty 60, 30d supply, fill #0

## 2020-11-24 MED ORDER — TRAZODONE HCL 50 MG PO TABS
25.0000 mg | ORAL_TABLET | Freq: Every evening | ORAL | 0 refills | Status: AC | PRN
  Filled 2020-11-24: qty 15, 30d supply, fill #0

## 2020-11-24 NOTE — Progress Notes (Signed)
Patient ID: Raymond Kerr, male   DOB: Aug 31, 1955, 65 y.o.   MRN: [id number] Spoke with daughter who has spoken with [employer] who will be delivering the hospital bed tomorrow before 12:00. Asked if could be delivered today and have not heard back yet from [employer]. Discussed setting up ambulance once bed is at the house. She asked if he is getting a wheelchair and informed her with a standard wheelchair he would fall out of it, he would nee a TS chair which is specialized wheelchair, once his medicare or medicaid is approved he could get one. She will pursue this. Have yet to find a home health agency to provide charity home health. Both [facility] and [facility] have declined pt. No bed offers either from any SNF who would take a LOG from the hospital. Daughter is aware of the care pt requires, just too much care for one person to do. Work toward discharge tomorrow. Dan-PA to go over discharge medications with her.

## 2020-11-24 NOTE — Progress Notes (Signed)
PROGRESS NOTE   Subjective/Complaints: Alert and sitting upright this morning Appears to understand what I am saying well, still with great difficulties in expression Smiling, pleasant   ROS: Denies pain, +spasticity   Objective:   No results found. No results for input(s): WBC, HGB, HCT, PLT in the last 72 hours.    No results for input(s): NA, K, CL, CO2, GLUCOSE, BUN, CREATININE, CALCIUM in the last 72 hours.     Intake/Output Summary (Last 24 hours) at 11/24/2020 1039 Last data filed at 11/24/2020 0900 Gross per 24 hour  Intake 796 ml  Output --  Net 796 ml        Physical Exam: Vital Signs Blood pressure 137/87, pulse 92, temperature 98.2 F (36.8 C), resp. rate 16, height 6' (1.829 m), weight 109.9 kg, SpO2 100 %. Gen: no distress, normal appearing HEENT: oral mucosa pink and moist, NCAT Cardio: Reg rate Chest: normal effort, normal rate of breathing Abd: soft, non-distended Ext: no edema Psych: pleasant, normal affect  Skin: intact Neuro: Alert, right central 7. Has a hard time keeping eyes open. Leans to right.  Global aphasia Makes eye contact, engages temporarily, not following commands No resting tone  Assessment/Plan: 1. Functional deficits which require 3+ hours per day of interdisciplinary therapy in a comprehensive inpatient rehab setting. Physiatrist is providing close team supervision and 24 hour management of active medical problems listed below. Physiatrist and rehab team continue to assess barriers to discharge/monitor patient progress toward functional and medical goals  Care Tool:  Bathing    Body parts bathed by patient: Face, Right arm, Chest, Abdomen, Front perineal area   Body parts bathed by helper: Buttocks, Right upper leg, Left upper leg, Left lower leg, Right lower leg, Left arm     Bathing assist Assist Level: Maximal Assistance - Patient 24 - 49%     Upper Body  Dressing/Undressing Upper body dressing   What is the patient wearing?: Pull over shirt    Upper body assist Assist Level: Moderate Assistance - Patient 50 - 74%    Lower Body Dressing/Undressing Lower body dressing      What is the patient wearing?: Pants, Incontinence brief     Lower body assist Assist for lower body dressing: Total Assistance - Patient < 25%     Toileting Toileting    Toileting assist Assist for toileting: Dependent - Patient 0%     Transfers Chair/bed transfer  Transfers assist  Chair/bed transfer activity did not occur: Safety/medical concerns  Chair/bed transfer assist level: 2 Helpers (Slide board)     Locomotion Ambulation   Ambulation assist   Ambulation activity did not occur: Safety/medical concerns          Walk 10 feet activity   Assist  Walk 10 feet activity did not occur: Safety/medical concerns        Walk 50 feet activity   Assist Walk 50 feet with 2 turns activity did not occur: Safety/medical concerns         Walk 150 feet activity   Assist Walk 150 feet activity did not occur: Safety/medical concerns         Walk 10 feet on  uneven surface  activity   Assist Walk 10 feet on uneven surfaces activity did not occur: Safety/medical concerns         Wheelchair     Assist Is the patient using a wheelchair?:  (yes, per PT note) Type of Wheelchair:  (manual per PT note) Wheelchair activity did not occur: Safety/medical concerns         Wheelchair 50 feet with 2 turns activity    Assist    Wheelchair 50 feet with 2 turns activity did not occur: Safety/medical concerns       Wheelchair 150 feet activity     Assist  Wheelchair 150 feet activity did not occur: Safety/medical concerns       Blood pressure 137/87, pulse 92, temperature 98.2 F (36.8 C), resp. rate 16, height 6' (1.829 m), weight 109.9 kg, SpO2 100 %.    Medical Problem List and Plan: 1.  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 2.  Impaired mobility -DVT/anticoagulation:  Pharmaceutical: Continue Lovenox             -antiplatelet therapy: Aspirin 81 mg daily and Plavix 75 mg daily x90 days then aspirin alone 3. Pain Management: Tylenol as needed 4. Mood: Provide emotional support             -antipsychotic agents: N/A 5. Neuropsych: This patient is not capable of making decisions on his own behalf. 6. Skin/Wound Care: Routine skin checks 7. Fluids/Electrolytes/Nutrition: Routine in and outs, nsg to push fluids, hopefully megace will help as well  8.  Post stroke dysphagia.  Mechanical soft thin liquids..  Patient limited p.o. intake.  Eating better, continue megace 9.  Hyperlipidemia. Continue Lipitor. LDL reviewed- 124.  10. Uncontrolled Diabetes mellitus with hyperglycemia.  Hemoglobin A1c 10.1.  CBGs 143-171: Semglee 10 units daily.  Diabetic teaching. Maintain metformin to 500mg  BID- will not increase further given nausea with higher dose. Monitor creatinine. Discontinue ISS.  Recent Labs    11/23/20 1141 11/23/20 1652 11/23/20 2100  GLUCAP 143* 143* 186*  Decrease Amaryl to 2mg . Continue this dose for now 11.  Obesity.  BMI 32.86.  Dietary follow-up. Discontinue feeding supplement- try to promote regular foods. 12. Sub-optimal potassium: start 01/23/21 daily supplement. Continue supplement.  13. Right arm and leg spasticity:    -continue splinting, ROM  -maintain off baclofen given lethargy and much improved tone 14. Lethargy: d/c Baclofen and increase Ritalin to 10mg  BID 15. Transaminitis: d/c tylenol and monitor LFTs weekly. Still rising, discussed with pharmacy and statin may contribute weakly, will maintain for now given stroke risk and repeat next week.  Liver ultrasound ordered and suggestive of cirrhosis. Will schedule for GI follow-up.  16. Impaired initiation: good response to Ritalin-increase Ritalin to 10mg  BID.   -dc baclofen 17.  Essential hypertension: increase Norvasc to 10mg  Vitals:   11/24/20 0311 11/24/20 0908  BP: 131/84 137/87  Pulse: 92   Resp: 16   Temp: 98.2 F (36.8 C)   SpO2: 100%    18.  Constipation: continue suppository PRN and Miralax BID.  19. Tachycardia: EKG shows sinus tachycardia: continue to monitor. 20. Urinary retention: continue flomax 0.4mg  HS 21. Disposition: d/c 10/4 (32 days total). F/u with me on 10/21. Reaching out to family to let them know  he will need 2 person support as well as Nurse, adult. Continue family training   LOS: 31 days A FACE TO FACE EVALUATION WAS PERFORMED  Clint Bolder P Jagjit Riner 11/24/2020, 10:39 AM

## 2020-11-24 NOTE — Progress Notes (Signed)
Discharge Inpatient Rehabilitation Medication Review by a Pharmacist   A complete drug regimen review was completed for this patient to identify any potential clinically significant medication issues.   High Risk Drug Classes Is patient taking? Indication by Medication  Antipsychotic No    Anticoagulant No    Antibiotic No    Opioid No    Antiplatelet Yes Aspirin/Plavix x 90 days, then aspirin alone  Hypoglycemics/insulin Yes Metformin, Amaryl for diabetes  Vasoactive Medication Yes Amlodipine for HTN, Atorvastatin for hyperlipidemia  Chemotherapy No    Other Yes B complex vitamins, vitamin D, methylphenidate for lethargy, Flomax for urinary retention, trazodone PRN for sleep, Miralax for constipation      Clinically significant medication issues were identified that warrant physician communication and completion of prescribed/recommended actions by midnight of the next day:  No   Name of provider notified for urgent issues identified: n/a   Provider Method of Notification: n/a       Pharmacist comments: n/a   Time spent performing this drug regimen review (minutes):  15     Raymond Kerr 11/24/2020 9:20 AM

## 2020-11-24 NOTE — Plan of Care (Signed)
  Problem: RH Balance Goal: LTG Patient will maintain dynamic standing with ADLs (OT) Description: LTG:  Patient will maintain dynamic standing balance with assist during activities of daily living (OT)  11/24/2020 1110 by Howerton-Davis, Glada Wickstrom R, OT/L Note: Goal not met secondary to continued need for +2 assist 11/24/2020 1107 by Howerton-Davis, Ricca Melgarejo R, OT/L Outcome: Not Met (add Reason)   Problem: Sit to Stand Goal: LTG:  Patient will perform sit to stand in prep for activites of daily living with assistance level (OT) Description: LTG:  Patient will perform sit to stand in prep for activites of daily living with assistance level (OT) 11/24/2020 1110 by Howerton-Davis, Deepa Barthel R, OT/L Note: Goal not met secondary to continued need for +2 assist 11/24/2020 1107 by Howerton-Davis, Marvin Maenza R, OT/L Outcome: Not Met (add Reason)   Problem: RH Bathing Goal: LTG Patient will bathe all body parts with assist levels (OT) Description: LTG: Patient will bathe all body parts with assist levels (OT) 11/24/2020 1110 by Howerton-Davis, Remmy Riffe R, OT/L Note: Goal not met secondary to continued RUE deficits and decreased cognition 11/24/2020 1107 by Howerton-Davis, Dilyn Osoria R, OT/L Outcome: Not Met (add Reason)   Problem: RH Dressing Goal: LTG Patient will perform lower body dressing w/assist (OT) Description: LTG: Patient will perform lower body dressing with assist, with/without cues in positioning using equipment (OT) 11/24/2020 1110 by Howerton-Davis, Sherise Geerdes R, OT/L Note: Goal not met secondary to continued RUE deficits and decreased cognition 11/24/2020 1107 by Howerton-Davis, Binta Statzer R, OT/L Outcome: Not Met (add Reason)   Problem: RH Toileting Goal: LTG Patient will perform toileting task (3/3 steps) with assistance level (OT) Description: LTG: Patient will perform toileting task (3/3 steps) with assistance level (OT)  11/24/2020 1110 by Howerton-Davis, Cleo Santucci R, OT/L Note: Goal not  met secondary to continued RUE deficits and decreased cognition 11/24/2020 1107 by Howerton-Davis, Dredyn Gubbels R, OT/L Outcome: Not Met (add Reason)   Problem: RH Functional Use of Upper Extremity Goal: LTG Patient will use RT/LT upper extremity as a (OT) Description: LTG: Patient will use right/left upper extremity as a stabilizer/gross assist/diminished/nondominant/dominant level with assist, with/without cues during functional activity (OT) 11/24/2020 1110 by Howerton-Davis, Jasiya Markie R, OT/L Note: Goal not met secondary to continued RUE deficits and decreased cognition 11/24/2020 1107 by Howerton-Davis, Malana Eberwein R, OT/L Outcome: Not Met (add Reason)   Problem: RH Toilet Transfers Goal: LTG Patient will perform toilet transfers w/assist (OT) Description: LTG: Patient will perform toilet transfers with assist, with/without cues using equipment (OT) 11/24/2020 1110 by Howerton-Davis, [place] Welcome R, OT/L Note: Goal not met secondary to continued RUE deficits and decreased cognition 11/24/2020 1107 by Howerton-Davis, Emre Stock R, OT/L Outcome: Not Met (add Reason)   Problem: RH Tub/Shower Transfers Goal: LTG Patient will perform tub/shower transfers w/assist (OT) Description: LTG: Patient will perform tub/shower transfers with assist, with/without cues using equipment (OT) 11/24/2020 1110 by Howerton-Davis, Cordarious Zeek R, OT/L Note: Goal not met secondary to continued RUE deficits and decreased cognition 11/24/2020 1107 by Howerton-Davis, Electa Sterry R, OT/L Outcome: Not Met (add Reason)

## 2020-11-24 NOTE — Progress Notes (Signed)
Inpatient Rehabilitation Care Coordinator Discharge Note   Patient Details  Name: Raymond Kerr MRN: [id number] Date of Birth: March 04, 1955   Discharge location: HOME TO DAUGHTER'S 24/7 CARE  Length of Stay: 32 DAYS  Discharge activity level: TOTAL ASSIST  Home/community participation: NO  Patient response DG:LOVFIE Literacy - How often do you need to have someone help you when you read instructions, pamphlets, or other written material from your doctor or pharmacy?: Patient unable to respond  Patient response PP:IRJJOA Isolation - How often do you feel lonely or isolated from those around you?: Patient unable to respond  Services provided included: MD, RD, PT, OT, SLP, RN, CM, Pharmacy, SW  Financial Services:  Financial Services Utilized: Other (Comment) (PENDING MEDICARE AND MEDICAID)    Choices offered to/list presented to: DAUGHTER  Follow-up services arranged:  Patient/Family has no preference for HH/DME agencies, DME UNABLE TO FIND HOME HEALTH TO ACCEPT HIM DUE TO UNINSURED. ONCE MEDICARE OR MEDICAID APPROVED CAN GET SERVICES.  MATCH PUT IN FOR PRESCRIPTION ASSISTANCE. AMBULANCE TRANSPORT HOME      DME : [facility]    Patient response to transportation need: Is the patient able to respond to transportation needs?: No In the past 12 months, has lack of transportation kept you from medical appointments or from getting medications?: No In the past 12 months, has lack of transportation kept you from meetings, work, or from getting things needed for daily living?: No    Comments (or additional information): DAUGHTER WAS HERE FOR SEVERAL DAYS FOR TRAINING AND IS AWARE TO CARE FOR IN BED NOT SAFE IN WHEELCHAIR DUE TO HIGH RISK TO FALL OUT OF. VERY HIGH RISK TO BE RE-ADMITTED DUE TO [facility] CARE AT DISCHARGE. NO SNF OFFERS TO [facility]WILL[facility] TO ACCEPT LOG.  Patient/Family verbalized understanding of follow-up arrangements:  Yes  Individual responsible for  coordination of the follow-up plan: [name]-DAUGHTER [phone]  Confirmed correct DME delivered: Lucy Chris 11/24/2020    Lucy Chris

## 2020-11-24 NOTE — Discharge Summary (Signed)
Physical Therapy Discharge Summary  Patient Details  Name: Raymond Kerr MRN: [id number] Date of Birth: 07/24/1955  Patient has met 1 of 4 long term goals due to improved balance, improved postural control, ability to compensate for deficits, and improved attention.  Patient to discharge at a wheelchair level Total Assist.   Patient's care partner is independent to provide the necessary physical and cognitive assistance at discharge.  Reasons goals not met: Pt required +2 assist for all bed mobility and functional transfers.   Recommendation:  Patient will benefit from ongoing skilled PT services in home health setting to continue to advance safe functional mobility, address ongoing impairments in R sided weakness, functional mobility deficits, caregiver training, family education, and minimize fall risk. Limited resources available from being unfunded without medical insurance. Family education has been provided.   Equipment: Hospital bed.   Reasons for discharge: treatment goals met and discharge from hospital  Patient/family agrees with progress made and goals achieved: Yes  PT Discharge Precautions/Restrictions Precautions Precautions: Fall Precaution Comments: R hemi, global aphasia, pusher to paretic side Restrictions Weight Bearing Restrictions: No Pain Pain Assessment Pain Scale: Faces Pain Score: 0-No pain Faces Pain Scale: Hurts little more Pain Type: Neuropathic pain Pain Location: Leg Pain Orientation: Right Pain Descriptors / Indicators: Grimacing;Guarding Pain Onset: With Activity Pain Interference Pain Interference Pain Effect on Sleep: 8. Unable to answer Pain Interference with Therapy Activities: 8. Unable to answer Pain Interference with Day-to-Day Activities: 8. Unable to answer Vision/Perception  Vision - History Ability to See in Adequate Light: 3 Highly impaired Vision - Assessment Eye Alignment: Impaired (comment) Ocular Range of Motion:  Impaired-to be further tested in functional context Alignment/Gaze Preference: Within Defined Limits Tracking/Visual Pursuits: Other (comment);Right eye does not track medially;Right eye does not track laterally Saccades: Impaired - to be further tested in functional context Additional Comments: Difficulty locating ADL items in R visual field. L eye ptosis. Decreased depth perception. Perception Perception: Impaired Inattention/Neglect: Does not attend to right side of body;Does not attend to right visual field Praxis Praxis: Impaired Praxis Impairment Details: Initiation;Ideomotor;Ideation;Motor planning Praxis-Other Comments: Difficulty following verbal commands to sequence familiar ADLs tasks although improved since initial evaluation.  Cognition Overall Cognitive Status: Impaired/Different from baseline Arousal/Alertness: Awake/alert Orientation Level: Oriented to person Year:  (Non-verbal) Month:  (Non-verbal) Day of Week:  (Non-verbal) Attention: Sustained;Selective;Focused Focused Attention: Appears intact Focused Attention Impairment: Verbal basic;Functional basic Sustained Attention: Impaired Sustained Attention Impairment: Verbal basic;Functional basic Selective Attention: Impaired Selective Attention Impairment: Verbal basic;Functional basic Memory:  (Difficulty to assess 2/2 severe aphasia.) Immediate Memory Recall:  (Non-verbal) Awareness: Impaired Awareness Impairment: Intellectual impairment Problem Solving: Impaired Problem Solving Impairment: Verbal basic;Functional basic Safety/Judgment: Impaired Comments: Cues for safety. Sensation Sensation Light Touch: Impaired Detail Light Touch Impaired Details: Impaired RUE;Impaired RLE Hot/Cold: Impaired by gross assessment Hot/Cold Impaired Details: Impaired RUE;Impaired RLE Proprioception: Impaired by gross assessment Proprioception Impaired Details: Impaired RLE;Impaired RUE Stereognosis: Not tested Additional  Comments: Difficult to formally assess due to aphasia Coordination Gross Motor Movements are Fluid and Coordinated: No Fine Motor Movements are Fluid and Coordinated: No Heel Shin Test: no active movement in the RLE Motor  Motor Motor: Hemiplegia;Abnormal tone Motor - Discharge Observations: RUE dense hemiplegia; increased tone in RLE.  Mobility Bed Mobility Bed Mobility: Rolling Right;Rolling Left;Supine to Sit;Sit to Supine Rolling Right: Total Assistance - Patient < 25% Rolling Left: Total Assistance - Patient < 25% Supine to Sit: 2 Helpers Sit to Supine: 2 Helpers Transfers Transfers: Sit to Stand;Stand to  Sit;Squat Pivot Transfers Sit to Stand: 2 Helpers;Maximal Assistance - Patient 25-49% (+2 maxA via 3-muskateers technique) Stand to Sit: 2 Helpers;Maximal Assistance - Patient 25-49% Squat Pivot Transfers: 2 Helpers;Total Assistance - Patient < 25% Lateral/Scoot Transfers: 2 Helpers;Maximal Assistance - Patient 25-49% Transfer (Assistive device): None Locomotion  Gait Ambulation: No Gait Gait: No Stairs / Additional Locomotion Stairs: No Wheelchair Mobility Wheelchair Mobility: No  Trunk/Postural Assessment  Cervical Assessment Cervical Assessment: Exceptions to [facility] (forward head with limited ROM to the R) Thoracic Assessment Thoracic Assessment: Exceptions to [facility] (rounded shoulders) Lumbar Assessment Lumbar Assessment: Exceptions to [facility] (posterior pelvic tilt) Postural Control Postural Control: Deficits on evaluation Trunk Control: R bias; decreased orientation to midline although improved since initial evaluation Righting Reactions: Delayed; insignificant  Balance Balance Balance Assessed: Yes Static Sitting Balance Static Sitting - Level of Assistance: 5: Stand by assistance Dynamic Sitting Balance Dynamic Sitting - Level of Assistance: 3: Mod assist;2: Max assist;Other (comment) Sitting balance - Comments: Impacted by pushing tendencies to paretic R  side. Static Standing Balance Static Standing - Level of Assistance: 1: +2 Total assist Extremity Assessment  RUE Assessment RUE Assessment: Exceptions to [facility] Active Range of Motion (AROM) Comments: 0 AROM RUE Body System: Neuro Brunstrum levels for arm and hand: Arm;Hand Brunstrum level for arm: Stage I Presynergy Brunstrum level for hand: Stage I Flaccidity RUE Tone RUE Tone: Moderate;Hypertonic LUE Assessment LUE Assessment: Within Functional Limits RLE Assessment RLE Assessment: Exceptions to [facility] General Strength Comments: 0/5 proximal to distal LLE Assessment LLE Assessment: Within Functional Limits General Strength Comments: grossly 4+/5 to 5/5 proximal to distal    Mariadelaluz [place] P Brentley Landfair PT 11/24/2020, 12:45 PM

## 2020-11-24 NOTE — Progress Notes (Addendum)
Occupational Therapy Discharge Summary  Patient Details  Name: Raymond Kerr MRN: [id number] Date of Birth: 1955-09-02  Today's Date: 11/24/2020 OT Individual Time: 0903-1000 OT Individual Time Calculation (min): 57 min    Patient has met 3 of 11 long term goals due to improved activity tolerance, improved balance, postural control, ability to compensate for deficits, and improved attention although continues to be greatly limited secondary to deficits.  Patient to discharge at overall Total Assist. Plan for d/c home with his daughter who is able to provide necessary level of assistance. Daughter attended family education session during this admission with education provided on safety with functional transfers, bed level ADLs and recommendations for DME.   Reasons goals not met: Patient continues to be limited by abnormal tone, unbalanced muscle activation, motor apraxia, decreased visual perceptual and visual motor skills, R inattention, decreased awareness, decreased problem solving, decreased safety awareness, delayed processing, decreased sitting/standing balance, decreased postural control, R dense hemiplegia, and decreased balance strategies. Patient requires Total A grossly for LB ADLs and toileting tasks at bed level and +2 assist for functional transfers with use of slideboard secondary to deficits listed above.     Recommendation:  Patient will benefit from ongoing skilled OT services in home health setting to continue to advance functional skills in the area of BADL and Reduce care partner burden.  Equipment: Harrel Lemon lift, Acupuncturist, hospital bed, TIS wc, wheelchair cushion   Reasons for discharge: discharge from hospital  Patient/family agrees with progress made and goals achieved: Yes  OT Discharge Patient met lying supine in bed in agreement with OT treatment session. 4/10 Rodena Goldmann in RLE with movement (particularly facial grimacing noted with flexion of R knee). +2 assist  for LB dressing in supine. Supine to EOB with Max A and +2 assist for slideboard transfer to TIS wc. Patient completed 50% of breakfast meal. Able to spear items on tray with fork and bring to mouth but requires assist to scoop cereal. Able to grasp cup and bring to mouth throughout meal. Oral hygiene seated at sink level in TIS wc with assist to thread toothpaste onto toothbrush. Patient minimally verbal throughout treatment session nodding/shaking head yes/no. Session concluded with patient seated in TIS wc with call bell within reach, belt alarm activated and all needs met.   Precautions/Restrictions  Precautions Precautions: Fall Precaution Comments: R hemi, global aphasia Restrictions Weight Bearing Restrictions: No General   Vital Signs Therapy Vitals BP: 137/87 Pain Pain Assessment Pain Scale: Faces Pain Score: 0-No pain Faces Pain Scale: Hurts little more Pain Type: Neuropathic pain Pain Location: Leg Pain Orientation: Right Pain Descriptors / Indicators: Grimacing;Guarding Pain Onset: With Activity ADL ADL Eating: Minimal assistance, Minimal cueing Where Assessed-Eating: Wheelchair Grooming: Minimal assistance, Moderate cueing Where Assessed-Grooming: Wheelchair Upper Body Bathing: Moderate assistance Where Assessed-Upper Body Bathing: Bed level Lower Body Bathing: Dependent Where Assessed-Lower Body Bathing: Bed level Upper Body Dressing: Moderate cueing Where Assessed-Upper Body Dressing: Edge of bed Lower Body Dressing: Dependent Where Assessed-Lower Body Dressing: Bed level Toileting: Dependent Where Assessed-Toileting: Medical laboratory scientific officer Method: Not assessed Tub/Shower Transfer: Not assessed Vision Baseline Vision/History: 0 No visual deficits Patient Visual Report: Other (comment) (Patient unable to state. L eye ptosis.) Vision Assessment?: Yes Eye Alignment: Impaired (comment) Ocular Range of Motion: Impaired-to be further tested in functional  context Alignment/Gaze Preference: Within Defined Limits Tracking/Visual Pursuits: Other (comment);Right eye does not track medially;Right eye does not track laterally Saccades: Impaired - to be further tested in functional context Visual  Fields: Right visual field deficit Additional Comments: Difficulty locating ADL items in R visual field. L eye ptosis. Decreased depth perception. Perception  Perception: Impaired Inattention/Neglect: Does not attend to right side of body;Does not attend to right visual field Praxis Praxis: Impaired Praxis Impairment Details: Initiation;Ideomotor;Ideation;Motor planning Praxis-Other Comments: Difficulty following verbal commands to sequence familiar ADLs tasks although improved since initial evaluation. Cognition Overall Cognitive Status: Impaired/Different from baseline Arousal/Alertness: Awake/alert Orientation Level: Oriented to person Year:  (Non-verbal) Month:  (Non-verbal) Day of Week:  (Non-verbal) Attention: Sustained;Selective;Focused Focused Attention: Appears intact Focused Attention Impairment: Verbal basic;Functional basic Sustained Attention: Impaired Sustained Attention Impairment: Verbal basic;Functional basic Selective Attention Impairment: Verbal basic;Functional basic Memory:  (Difficulty to assess 2/2 severe aphasia.) Immediate Memory Recall:  (Non-verbal) Awareness: Impaired Awareness Impairment: Intellectual impairment Problem Solving: Impaired Problem Solving Impairment: Verbal basic;Functional basic Safety/Judgment: Impaired Comments: Cues for safety. Sensation Sensation Light Touch: Impaired Detail Light Touch Impaired Details: Impaired RUE;Impaired RLE Hot/Cold: Impaired by gross assessment Hot/Cold Impaired Details: Impaired RUE;Impaired RLE Proprioception: Impaired by gross assessment Proprioception Impaired Details: Impaired RLE;Impaired RUE Stereognosis: Not tested Coordination Gross Motor Movements are Fluid  and Coordinated: No Fine Motor Movements are Fluid and Coordinated: No Motor  Motor Motor: Hemiplegia;Abnormal tone Motor - Discharge Observations: RUE dense hemiplegia; increased tone in RLE. Mobility  Bed Mobility Bed Mobility: Rolling Right;Rolling Left;Supine to Sit;Sit to Supine Rolling Right: Maximal Assistance - Patient 25-49% Rolling Left: Maximal Assistance - Patient 25-49% Supine to Sit: 2 Helpers Sit to Supine: 2 Helpers Transfers Sit to Stand: Dependent - mechanical lift;2 Helpers  Trunk/Postural Assessment  Cervical Assessment Cervical Assessment: Exceptions to [facility] (Limited ROM to R) Thoracic Assessment Thoracic Assessment: Exceptions to [facility] Lumbar Assessment Lumbar Assessment: Exceptions to [facility] Postural Control Postural Control: Deficits on evaluation Trunk Control: R bias; decreased orientation to midline although improved since initial evaluation Righting Reactions: Delayed; insignificant  Balance Balance Balance Assessed: Yes Static Sitting Balance Static Sitting - Level of Assistance: 5: Stand by assistance Dynamic Sitting Balance Dynamic Sitting - Level of Assistance: 3: Mod assist;2: Max assist Sitting balance - Comments: Able to self-correct R lateral lean with multimodal cues. Static Standing Balance Static Standing - Level of Assistance: 1: +2 Total assist (Stedy) Extremity/Trunk Assessment RUE Assessment RUE Assessment: Exceptions to [facility] Active Range of Motion (AROM) Comments: 0 AROM RUE Body System: Neuro Brunstrum levels for arm and hand: Arm;Hand Brunstrum level for arm: Stage I Presynergy Brunstrum level for hand: Stage I Flaccidity RUE Tone RUE Tone: Moderate;Hypertonic     Connery Shiffler R Howerton-Davis 11/24/2020, 10:24 AM

## 2020-11-24 NOTE — Progress Notes (Signed)
Physical Therapy Session Note  Patient Details  Name: Raymond Kerr MRN: [id number] Date of Birth: [birth year]  Today's Date: 11/24/2020 PT Individual Time: 4174-0814 PT Individual Time Calculation (min): 72 min   Short Term Goals: Week 4:  PT Short Term Goal 1 (Week 4): STG= LTG based on ELOS   Skilled Therapeutic Interventions/Progress Updates:     Pt sitting in w/c to start session. Daughter, [name], at bedside. Pt shakes head 'no' to pain but grimmaces during PROM of RLE later in the session. Focus of session to review extensive family education/training as pt is discharging to daughter's apartment. Reviewed PT goals, PT POC, pt's current mobility status, DME rec's, home safety, R hemibody flaccidity, R pushing tendencies, limits to f/u care due to lack of insurance, bed level ONLY (no EOB, no hoyer lifts, no w/c) due to safety concerns. Reviewed general diet restrictions, calling emergency services for falls or change in status, hospital bed features, repositioning in bed Q2, pillows for body prominences, importance of skin integrity/protection, etc.   Pt assisted back to bed due to bladder incontinence. Required +2 totalA squat<>pivot transfer and +2 totalA for sit>supine. Rolled in bed several times with +2 maxA and showed daughter how to manage brief, pericare, and also with body mechanics during this to prevent self injury. Reviewed PROM techniques for RLE to address R heel cord/hamstring, hip and knee tightness.   All questions and concerns addressed from daughter who was appreciative of education and training. Pt completed session supine in bed with bed alarm on and all needs within reach, daughter remained at bedside.   Therapy Documentation Precautions:  Precautions Precautions: Fall Precaution Comments: R hemi, global aphasia Restrictions Weight Bearing Restrictions: No General:    Therapy/Group: Individual Therapy  Yoshi Mancillas P Islam Eichinger PT 11/24/2020, 7:47 AM

## 2020-11-24 NOTE — Progress Notes (Signed)
Patient's daughter educated on how to check blood sugar, she expressed understanding of the teaching. We continue to monitor.

## 2020-11-25 LAB — GLUCOSE, CAPILLARY
Glucose-Capillary: 200 mg/dL — ABNORMAL HIGH (ref 70–99)
Glucose-Capillary: 195 mg/dL — ABNORMAL HIGH (ref 70–99)
Glucose-Capillary: 176 mg/dL — ABNORMAL HIGH (ref 70–99)

## 2020-11-25 NOTE — Progress Notes (Addendum)
[facility] arrived to pick up patient , family called and verified address spoke with [name] -daughter,made aware of transfer to home. no acute distress noted alert and non verbal, aphasic, made aware of transfer to home   2011 t2 Transported via stretcher and [facility] staff no acute distress, respiration unlabored on RA

## 2020-11-25 NOTE — Progress Notes (Addendum)
Patient ID: Raymond Kerr, male   DOB: Jul 18, 1955, 65 y.o.   MRN: [id number] Hospital bed has been delivered as of 3:00 pm, now waiting for ambulance to transport him home  3:52 PM Contacted [facility] who informed me many pts waiting for ambulance services. It may be 2-3 hours before will be here to transport pt home. Have contacted daughter to let her know this and nursing staff aware of this also.

## 2020-11-25 NOTE — Progress Notes (Signed)
PROGRESS NOTE   Subjective/Complaints: No complaints this morning Stable for d/c Daughter educated regarding CBG checks   ROS: Denies pain, +spasticity   Objective:   No results found. No results for input(s): WBC, HGB, HCT, PLT in the last 72 hours.    No results for input(s): NA, K, CL, CO2, GLUCOSE, BUN, CREATININE, CALCIUM in the last 72 hours.     Intake/Output Summary (Last 24 hours) at 11/25/2020 1010 Last data filed at 11/25/2020 0817 Gross per 24 hour  Intake 474 ml  Output --  Net 474 ml        Physical Exam: Vital Signs Blood pressure (!) 159/92, pulse (!) 109, temperature 98.1 F (36.7 C), temperature source Oral, resp. rate 16, height 6' (1.829 m), weight 109.9 kg, SpO2 100 %. Gen: no distress, normal appearing HEENT: oral mucosa pink and moist, NCAT Cardio: Tachycardia Chest: normal effort, normal rate of breathing Abd: soft, non-distended Ext: no edema Psych: pleasant, normal affect  Skin: intact Neuro: Alert, right central 7. Has a hard time keeping eyes open. Leans to right.  Global aphasia Makes eye contact, engages temporarily, not following commands No resting tone  Assessment/Plan: 1. Functional deficits which require 3+ hours per day of interdisciplinary therapy in a comprehensive inpatient rehab setting. Physiatrist is providing close team supervision and 24 hour management of active medical problems listed below. Physiatrist and rehab team continue to assess barriers to discharge/monitor patient progress toward functional and medical goals  Care Tool:  Bathing    Body parts bathed by patient: Face, Right arm, Chest, Abdomen, Front perineal area   Body parts bathed by helper: Buttocks, Right upper leg, Left upper leg, Left lower leg, Right lower leg, Left arm     Bathing assist Assist Level: Maximal Assistance - Patient 24 - 49%     Upper Body Dressing/Undressing Upper  body dressing   What is the patient wearing?: Pull over shirt    Upper body assist Assist Level: Moderate Assistance - Patient 50 - 74%    Lower Body Dressing/Undressing Lower body dressing      What is the patient wearing?: Pants, Incontinence brief     Lower body assist Assist for lower body dressing: Total Assistance - Patient < 25%     Toileting Toileting    Toileting assist Assist for toileting: Dependent - Patient 0%     Transfers Chair/bed transfer  Transfers assist  Chair/bed transfer activity did not occur: Safety/medical concerns  Chair/bed transfer assist level: 2 Helpers     Locomotion Ambulation   Ambulation assist   Ambulation activity did not occur: Safety/medical concerns          Walk 10 feet activity   Assist  Walk 10 feet activity did not occur: Safety/medical concerns        Walk 50 feet activity   Assist Walk 50 feet with 2 turns activity did not occur: Safety/medical concerns         Walk 150 feet activity   Assist Walk 150 feet activity did not occur: Safety/medical concerns         Walk 10 feet on uneven surface  activity   Assist Walk  10 feet on uneven surfaces activity did not occur: Safety/medical concerns         Wheelchair     Assist Is the patient using a wheelchair?: No Type of Wheelchair: Manual Wheelchair activity did not occur: Safety/medical concerns  Wheelchair assist level: Dependent - Patient 0%      Wheelchair 50 feet with 2 turns activity    Assist    Wheelchair 50 feet with 2 turns activity did not occur: Safety/medical concerns   Assist Level: Dependent - Patient 0%   Wheelchair 150 feet activity     Assist  Wheelchair 150 feet activity did not occur: Safety/medical concerns   Assist Level: Dependent - Patient 0%   Blood pressure (!) 159/92, pulse (!) 109, temperature 98.1 F (36.7 C), temperature source Oral, resp. rate 16, height 6' (1.829 m), weight 109.9  kg, SpO2 100 %.    Medical Problem List and Plan: 1.  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 2.  Impaired mobility -DVT/anticoagulation:  Pharmaceutical: Continue Lovenox             -antiplatelet therapy: Aspirin 81 mg daily and Plavix 75 mg daily x90 days then aspirin alone 3. Pain Management: Tylenol as needed 4. Mood: Provide emotional support             -antipsychotic agents: N/A 5. Neuropsych: This patient is not capable of making decisions on his own behalf. 6. Skin/Wound Care: Routine skin checks 7. Fluids/Electrolytes/Nutrition: Routine in and outs, nsg to push fluids, hopefully megace will help as well  8.  Post stroke dysphagia.  Mechanical soft thin liquids..  Patient limited p.o. intake.  Eating better, continue megace 9.  Hyperlipidemia. Continue Lipitor. LDL reviewed- 124.  10. Uncontrolled Diabetes mellitus with hyperglycemia.  Hemoglobin A1c 10.1.  CBGs 143-171: Semglee 10 units daily.  Diabetic teaching. Maintain metformin to 500mg  BID- will not increase further given nausea with higher dose. Monitor creatinine. Discontinue ISS.  Recent Labs    11/24/20 1647 11/24/20 2112 11/25/20 0657  GLUCAP 180* 140* 200*  Decrease Amaryl to 2mg . Continue this dose for now 11.  Obesity.  BMI 32.86.  Dietary follow-up. Discontinue feeding supplement- try to promote regular foods. 12. Sub-optimal potassium: start 01/25/21 daily supplement. Continue supplement.  13. Right arm and leg spasticity:    -continue splinting, ROM  -maintain off baclofen given lethargy and much improved tone 14. Lethargy: d/c Baclofen and increase Ritalin to 10mg  BID 15. Transaminitis: d/c tylenol and monitor LFTs weekly. Still rising, discussed with pharmacy and statin may contribute weakly, will maintain  for now given stroke risk and repeat next week. Liver ultrasound ordered and suggestive of cirrhosis. Will schedule for GI follow-up.  16. Impaired initiation: good response to Ritalin-increase Ritalin to 10mg  BID.   -dc baclofen 17.  Essential hypertension: increase Norvasc to 10mg  Can consider restarting Baclofen HS if continues to be hypertensive outpatient Vitals:   11/25/20 0700 11/25/20 0701  BP:  (!) 159/92  Pulse:  (!) 109  Resp:  16  Temp: 98.1 F (36.7 C) 98.1 F (36.7 C)  SpO2:  100%   18.  Constipation: continue suppository PRN and Miralax BID.  19. Tachycardia: EKG shows sinus tachycardia: continue to monitor. 20. Urinary retention: continue flomax 0.4mg  HS 21. Disposition: d/c 10/4 (32 days  total). F/u with me on 10/21. Reaching out to family to let them know he will need 2 person support as well as Nurse, adult. Continue family training   >30 minutes spent in discharge of patient including review of medications and follow-up appointments, physical examination, and in answering all patient's questions    LOS: 32 days A FACE TO FACE EVALUATION WAS PERFORMED  Deeanna Beightol P Brande Uncapher 11/25/2020, 10:10 AM

## 2020-12-02 ENCOUNTER — Emergency Department: Payer: Medicaid Other

## 2020-12-02 ENCOUNTER — Emergency Department
Admission: EM | Admit: 2020-12-02 | Discharge: 2020-12-02 | Disposition: A | Discharge status: Home / Self Care | Payer: Medicaid Other | Attending: Emergency Medicine | Admitting: Emergency Medicine

## 2020-12-02 ENCOUNTER — Encounter: Payer: Self-pay | Admitting: Gastroenterology

## 2020-12-02 ENCOUNTER — Other Ambulatory Visit: Payer: Self-pay

## 2020-12-02 DIAGNOSIS — R4701 Aphasia: Secondary | ICD-10-CM | POA: Insufficient documentation

## 2020-12-02 DIAGNOSIS — R000 Tachycardia, unspecified: Secondary | ICD-10-CM | POA: Insufficient documentation

## 2020-12-02 DIAGNOSIS — R109 Unspecified abdominal pain: Secondary | ICD-10-CM | POA: Insufficient documentation

## 2020-12-02 DIAGNOSIS — Z7984 Long term (current) use of oral hypoglycemic drugs: Secondary | ICD-10-CM | POA: Insufficient documentation

## 2020-12-02 DIAGNOSIS — Z7902 Long term (current) use of antithrombotics/antiplatelets: Secondary | ICD-10-CM | POA: Insufficient documentation

## 2020-12-02 DIAGNOSIS — Z20822 Contact with and (suspected) exposure to covid-19: Secondary | ICD-10-CM | POA: Insufficient documentation

## 2020-12-02 DIAGNOSIS — E860 Dehydration: Secondary | ICD-10-CM | POA: Diagnosis not present

## 2020-12-02 DIAGNOSIS — R4182 Altered mental status, unspecified: Secondary | ICD-10-CM | POA: Insufficient documentation

## 2020-12-02 DIAGNOSIS — K5792 Diverticulitis of intestine, part unspecified, without perforation or abscess without bleeding: Secondary | ICD-10-CM | POA: Diagnosis not present

## 2020-12-02 DIAGNOSIS — Z79899 Other long term (current) drug therapy: Secondary | ICD-10-CM | POA: Diagnosis not present

## 2020-12-02 DIAGNOSIS — I10 Essential (primary) hypertension: Secondary | ICD-10-CM | POA: Diagnosis not present

## 2020-12-02 DIAGNOSIS — R410 Disorientation, unspecified: Secondary | ICD-10-CM | POA: Insufficient documentation

## 2020-12-02 DIAGNOSIS — E119 Type 2 diabetes mellitus without complications: Secondary | ICD-10-CM | POA: Diagnosis not present

## 2020-12-02 DIAGNOSIS — Z7982 Long term (current) use of aspirin: Secondary | ICD-10-CM | POA: Diagnosis not present

## 2020-12-02 LAB — COMPREHENSIVE METABOLIC PANEL
ALT: 272 U/L — ABNORMAL HIGH (ref 0–44)
AST: 195 U/L — ABNORMAL HIGH (ref 15–41)
Albumin: 3.3 g/dL — ABNORMAL LOW (ref 3.5–5.0)
Alkaline Phosphatase: 68 U/L (ref 38–126)
Anion gap: 8 (ref 5–15)
BUN: 15 mg/dL (ref 8–23)
CO2: 25 mmol/L (ref 22–32)
Calcium: 9.2 mg/dL (ref 8.9–10.3)
Chloride: 105 mmol/L (ref 98–111)
Creatinine, Ser: 0.99 mg/dL (ref 0.61–1.24)
GFR, Estimated: >60 mL/min (ref >60)
Glucose, Bld: 173 mg/dL — ABNORMAL HIGH (ref 70–99)
Potassium: 3.9 mmol/L (ref 3.5–5.1)
Sodium: 138 mmol/L (ref 135–145)
Total Bilirubin: 1.1 mg/dL (ref 0.3–1.2)
Total Protein: 7.7 g/dL (ref 6.5–8.1)

## 2020-12-02 LAB — URINALYSIS, ROUTINE W REFLEX MICROSCOPIC
Bacteria, UA: NONE SEEN
Bilirubin Urine: NEGATIVE
Glucose, UA: NEGATIVE mg/dL
Hgb urine dipstick: NEGATIVE
Ketones, ur: 5 mg/dL — AB
Leukocytes,Ua: NEGATIVE
Nitrite: NEGATIVE
Protein, ur: >=300 mg/dL — AB
Specific Gravity, Urine: 1.029 (ref 1.005–1.030)
pH: 5 (ref 5.0–8.0)

## 2020-12-02 LAB — CBC
HCT: 40 % (ref 39.0–52.0)
Hemoglobin: 13.5 g/dL (ref 13.0–17.0)
MCH: 29.2 pg (ref 26.0–34.0)
MCHC: 33.8 g/dL (ref 30.0–36.0)
MCV: 86.4 fL (ref 80.0–100.0)
Platelets: 374 10*3/uL (ref 150–400)
RBC: 4.63 MIL/uL (ref 4.22–5.81)
RDW: 14.7 % (ref 11.5–15.5)
WBC: 8.5 10*3/uL (ref 4.0–10.5)
nRBC: 0 % (ref 0.0–0.2)

## 2020-12-02 LAB — RESP PANEL BY RT-PCR (FLU A&B, COVID) ARPGX2
Influenza A by PCR: NEGATIVE
Influenza B by PCR: NEGATIVE
SARS Coronavirus 2 by RT PCR: NEGATIVE

## 2020-12-02 LAB — LACTIC ACID, PLASMA
Lactic Acid, Venous: 1.6 mmol/L (ref 0.5–1.9)
Lactic Acid, Venous: 1.5 mmol/L (ref 0.5–1.9)

## 2020-12-02 LAB — CBG MONITORING, ED: Glucose-Capillary: 182 mg/dL — ABNORMAL HIGH (ref 70–99)

## 2020-12-02 IMAGING — DX DG CHEST 1V PORT
1 series · 1 of 1 positions shown · non-contrast
Comparison: Chest x-ray [DATE]

CLINICAL DATA: Shortness of breath

EXAM:
PORTABLE CHEST 1 VIEW

[chest ap]
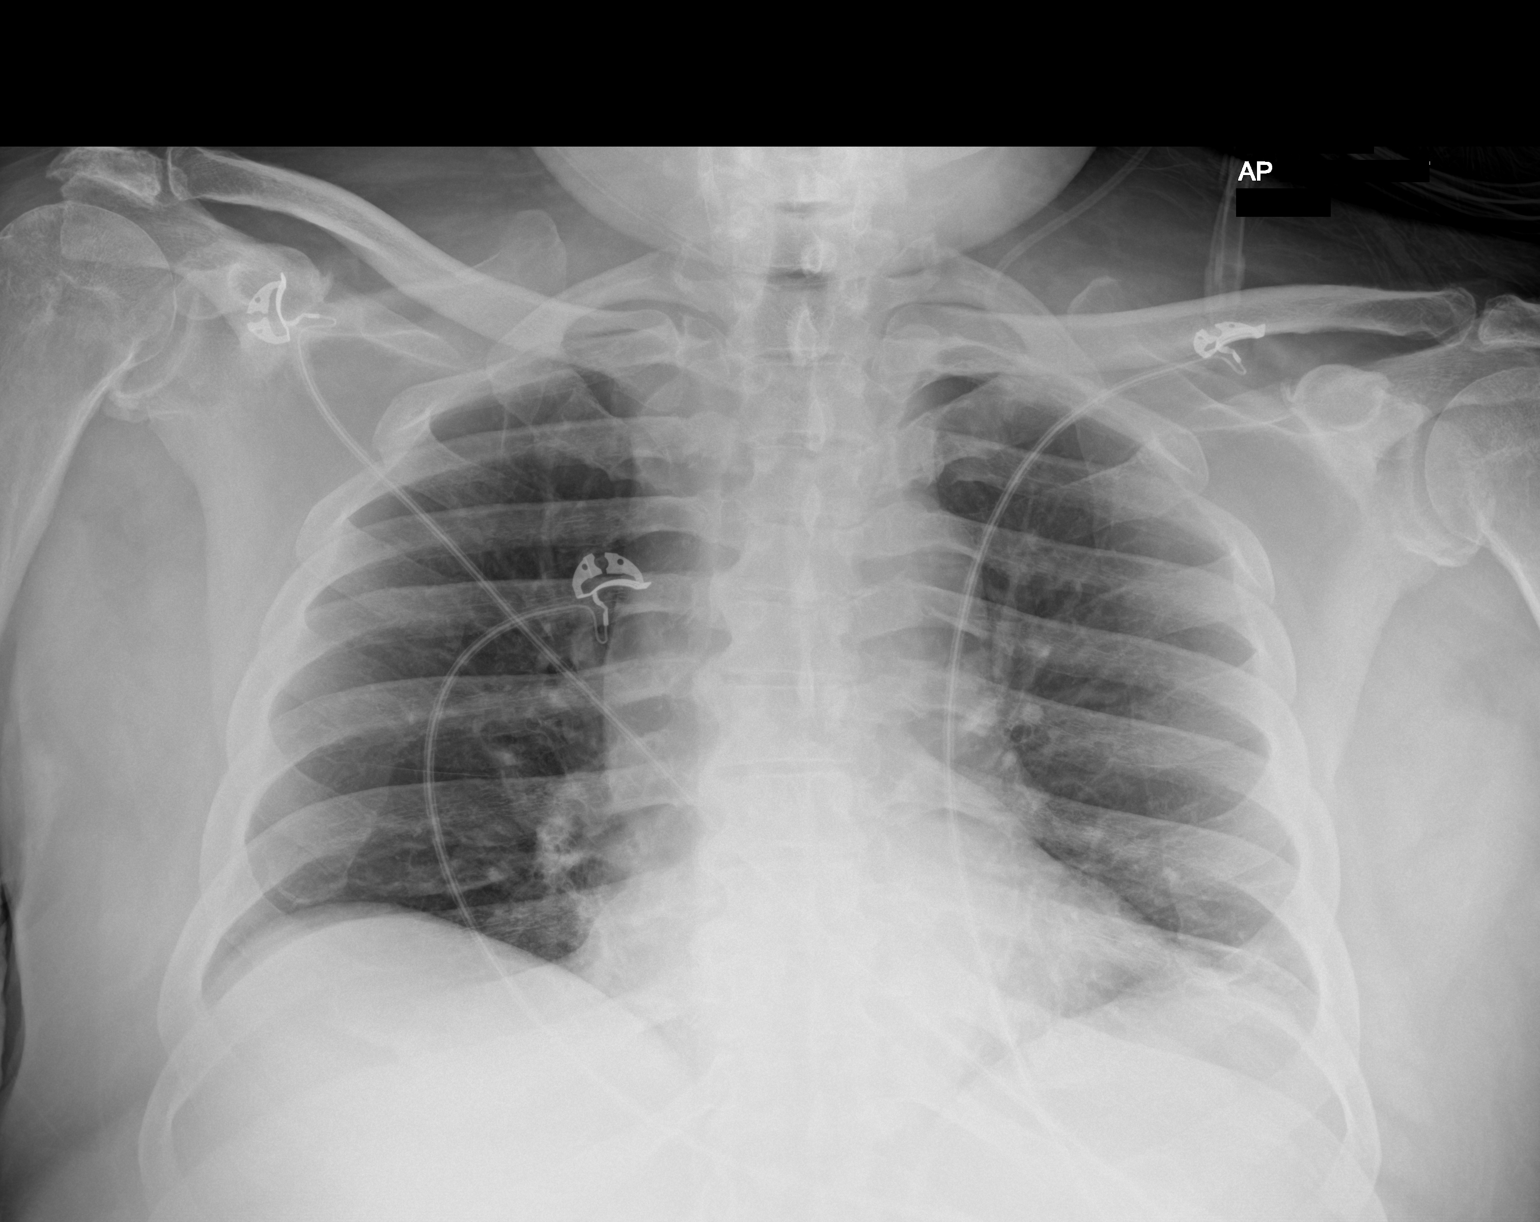

[1 of 1 positions shown; findings below may reference images not displayed]

FINDINGS: The heart and mediastinal contours are unchanged.

No focal consolidation. No pulmonary edema. No pleural effusion. No
pneumothorax.

No acute osseous abnormality.
IMPRESSION: No active disease.

## 2020-12-02 IMAGING — CT CT ABD-PELV W/ CM
2 of 5 series · 16 of 46 positions shown, 18 images · IV contrast (APPLIED)
Comparison: None.

CLINICAL DATA: Acute nonlocalized abdominal pain. Recent stroke
with right-sided weakness.

EXAM:
CT ABDOMEN AND PELVIS WITH CONTRAST
TECHNIQUE: Multidetector CT imaging of the abdomen and pelvis was performed
using the standard protocol following bolus administration of
intravenous contrast.
CONTRAST:  80mL OMNIPAQUE IOHEXOL 350 MG/ML SOLN

[Series 2: routine abd/pel with · axial · 0.98mm/px · z∈[-699,-229]mm · 13 of 106 slices shown, 15 images]
[im 6/106  soft-tissue]
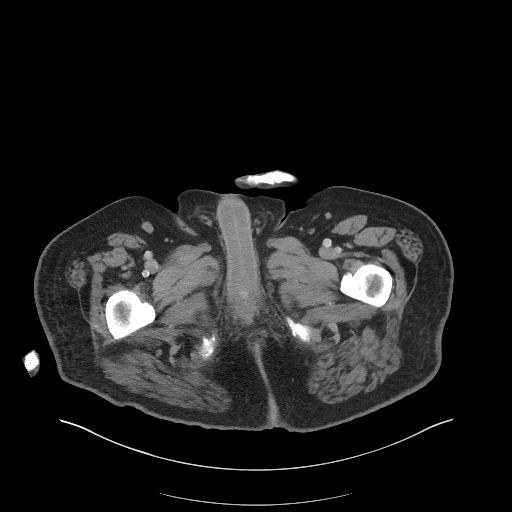
[im 6/106  bone]
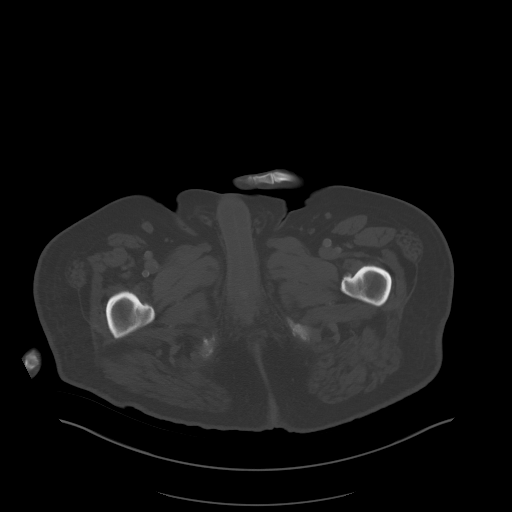
[im 16/106  soft-tissue]
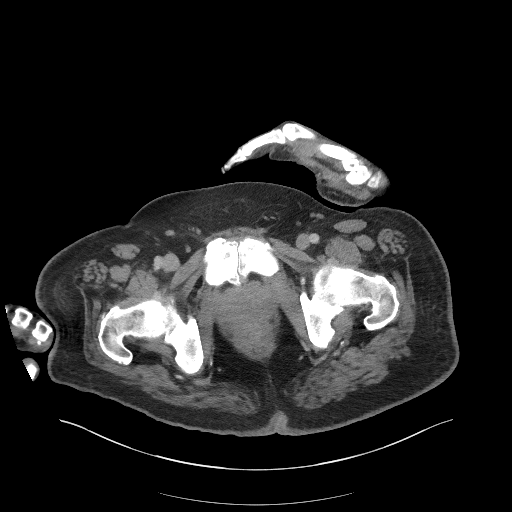
[im 22/106  soft-tissue]
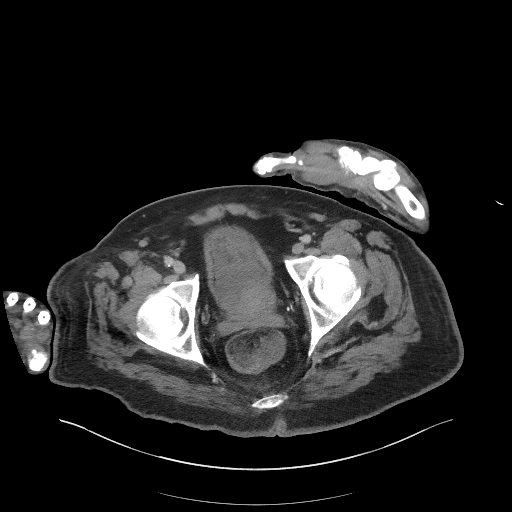
[im 32/106  soft-tissue]
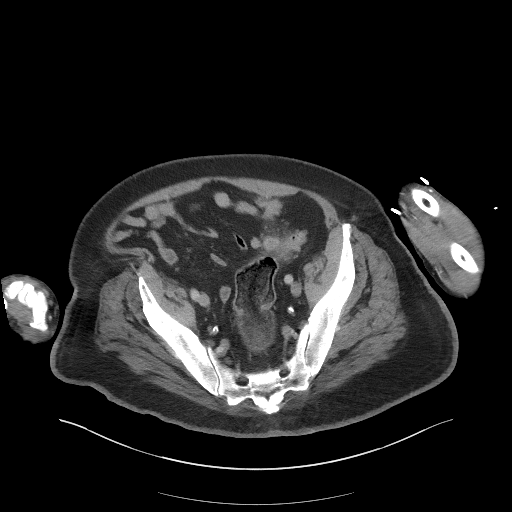
[im 37/106  soft-tissue]
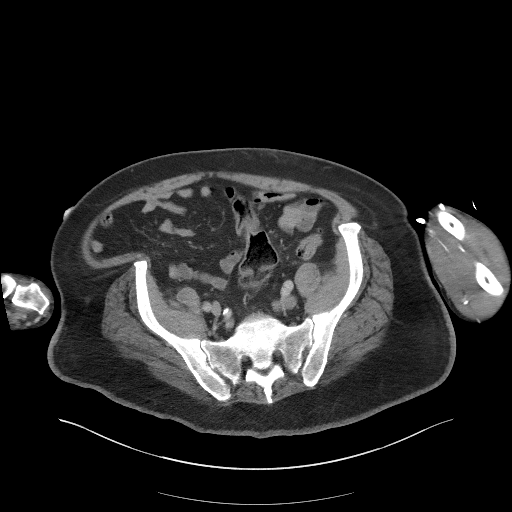
[im 48/106  soft-tissue]
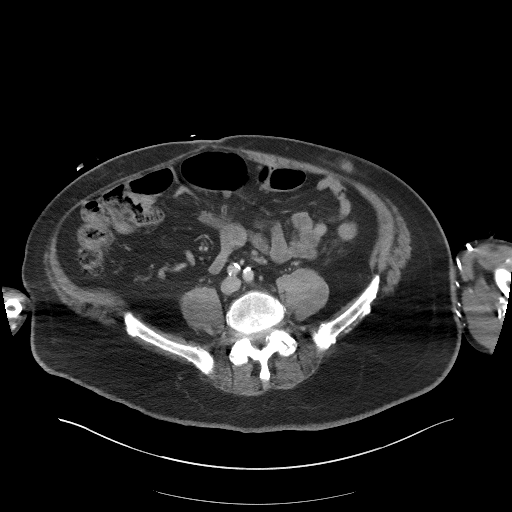
[im 53/106  soft-tissue]
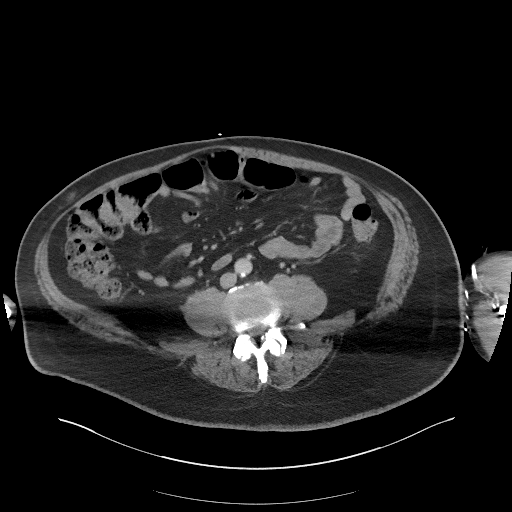
[im 58/106  soft-tissue]
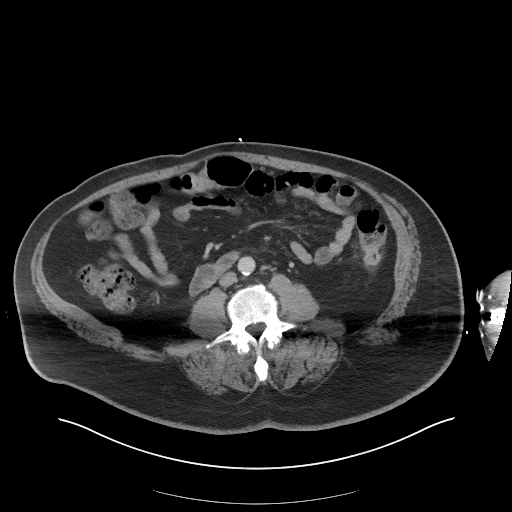
[im 69/106  soft-tissue]
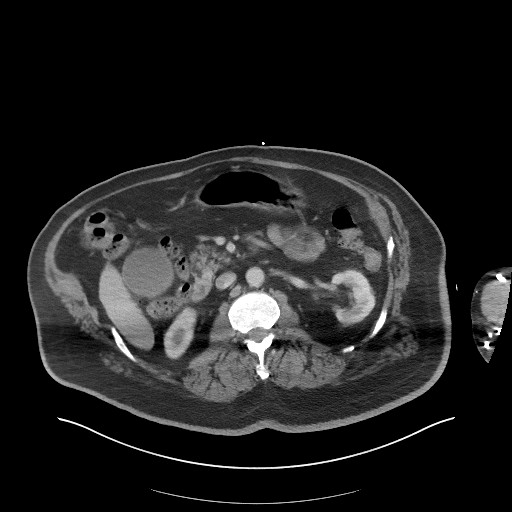
[im 69/106  bone]
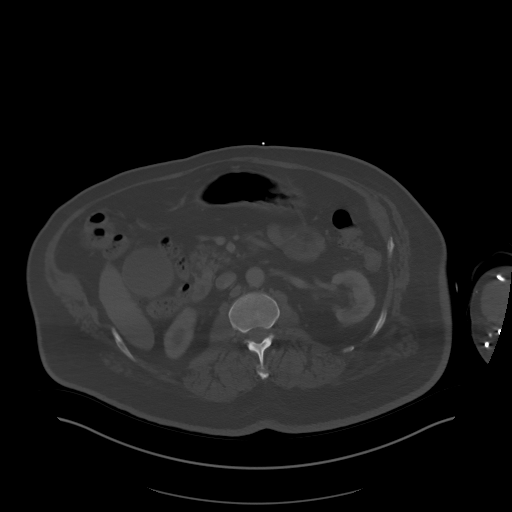
[im 74/106  soft-tissue]
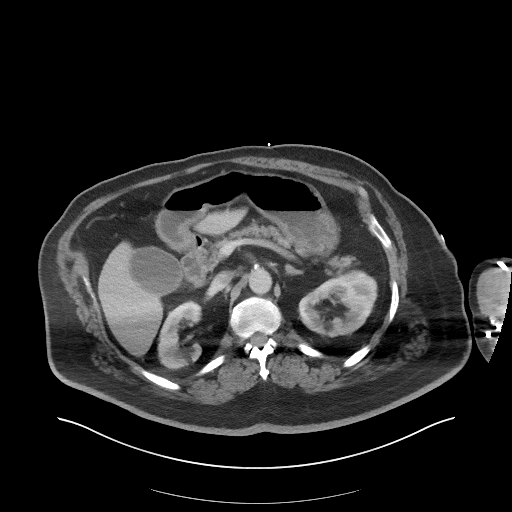
[im 85/106  soft-tissue]
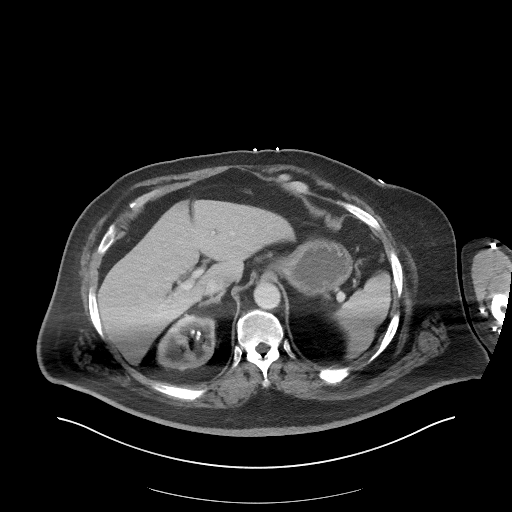
[im 90/106  soft-tissue]
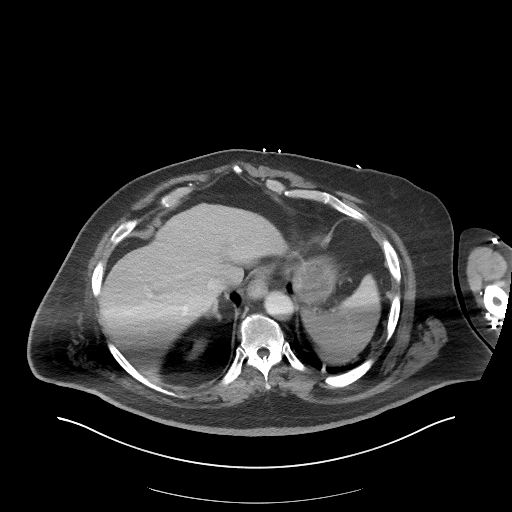
[im 100/106  soft-tissue]
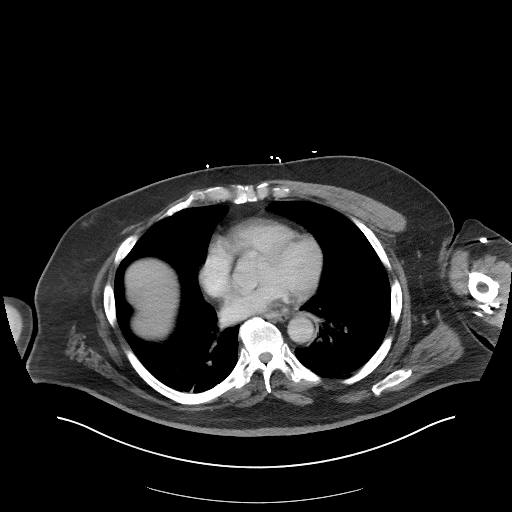

[Series 5: coronal st · coronal · 0.94mm/px · 3 of 99 slices shown]
[im 33/99  soft-tissue]
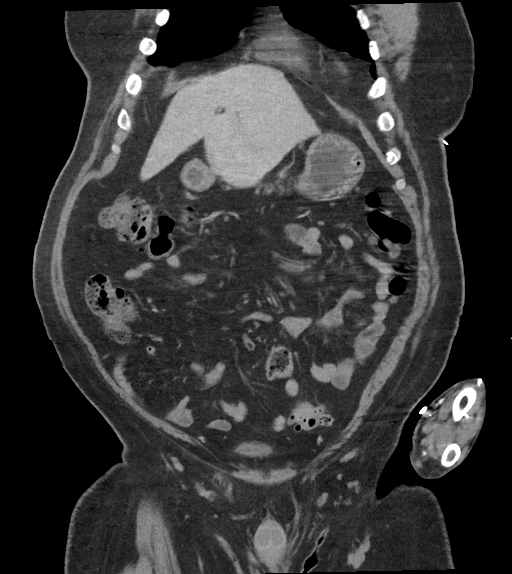
[im 44/99  soft-tissue]
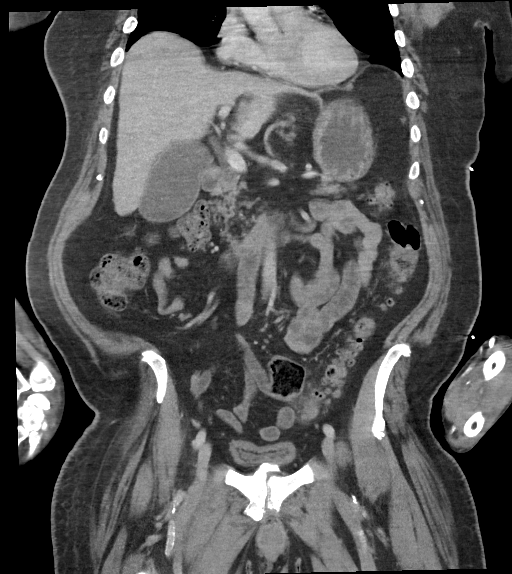
[im 55/99  soft-tissue]
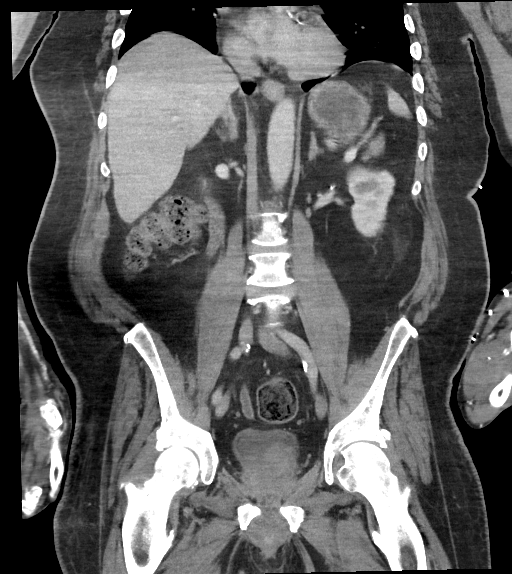

[16 of 46 positions shown; findings below may reference images not displayed]

FINDINGS: Lower chest: Linear atelectasis in the lung bases. Coronary artery
calcifications.

Hepatobiliary: The gallbladder is moderately distended without stone
or wall thickening. No bile duct dilatation. No focal liver lesions.

Pancreas: Unremarkable. No pancreatic ductal dilatation or
surrounding inflammatory changes.

Spleen: Normal in size without focal abnormality.

Adrenals/Urinary Tract: No adrenal gland nodules. Multiple stones
demonstrated in the upper pole of the right kidney measuring in
total, 1.3 cm. Nephrograms are symmetrical. No hydronephrosis or
hydroureter. Bladder wall is diffusely thickened with heterogeneous
increased density in the urine suggesting cystitis.

Stomach/Bowel: Stomach, small bowel, and colon are not abnormally
distended. Scattered stool within the colon. Sigmoid colonic
diverticulosis with stranding around the sigmoid region at the
junction of the descending and sigmoid. This likely represents early
diverticulitis. However, the short segment of involvement suggest
possible underlying neoplasm and follow-up after resolution of the
acute process is recommended. No proximal obstruction. Appendix is
normal.

Vascular/Lymphatic: Aortic atherosclerosis. No enlarged abdominal or
pelvic lymph nodes.

Reproductive: Prostate gland is markedly enlarged.

Other: No free air or free fluid in the abdomen. Foci of
infiltration in the anterior abdominal wall likely represent
injection sites.

Musculoskeletal: Degenerative changes in the spine. Degenerative
changes in the hips and symphysis pubis. No destructive bone
lesions.
IMPRESSION: 1. Atelectasis in the lung bases.
2. Nonobstructing stones in the upper pole right kidney.
3. Sigmoid colonic diverticulosis with focal stranding suggesting
early diverticulitis. No abscess. Follow-up after resolution of
acute process recommended to exclude underlying neoplasm.
4. Aortic atherosclerosis.
5. Prostate gland is enlarged.
6. Bladder wall thickening and heterogeneous appearance of the urine
likely representing cystitis.

## 2020-12-02 IMAGING — CT CT HEAD W/O CM
4 series · 16 of 47 positions shown, 18 images · non-contrast
Comparison: CT head [DATE]

CLINICAL DATA: Mental status change.  Unknown cause.

EXAM:
CT HEAD WITHOUT CONTRAST
TECHNIQUE: Contiguous axial images were obtained from the base of the skull
through the vertex without intravenous contrast.

[Series 2: head wo · axial · 0.47mm/px · z∈[+363,+483]mm · 7 of 32 slices shown, 9 images]
[im 4/32  brain]
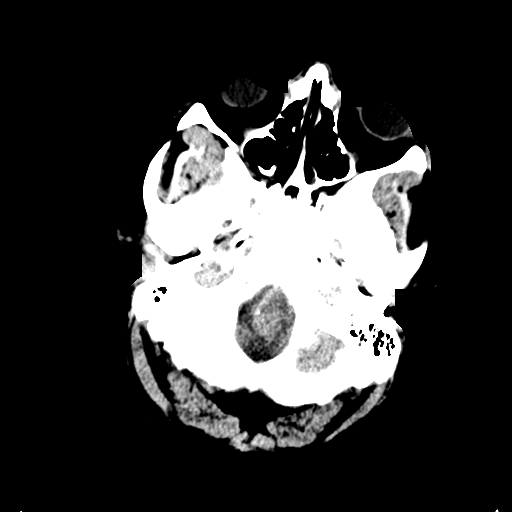
[im 4/32  bone]
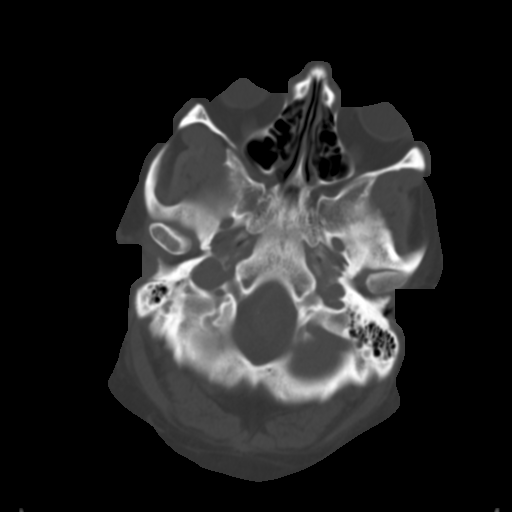
[im 8/32  brain]
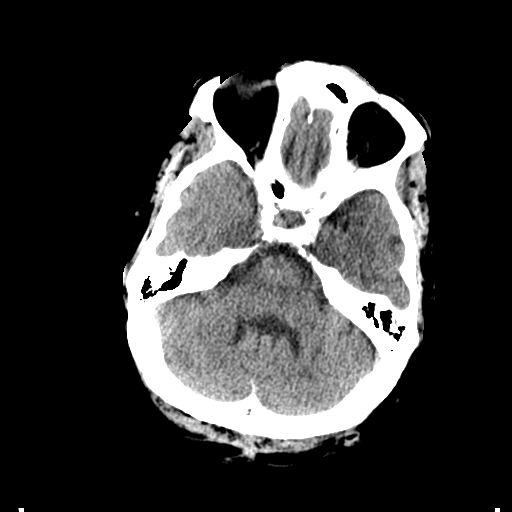
[im 12/32  brain]
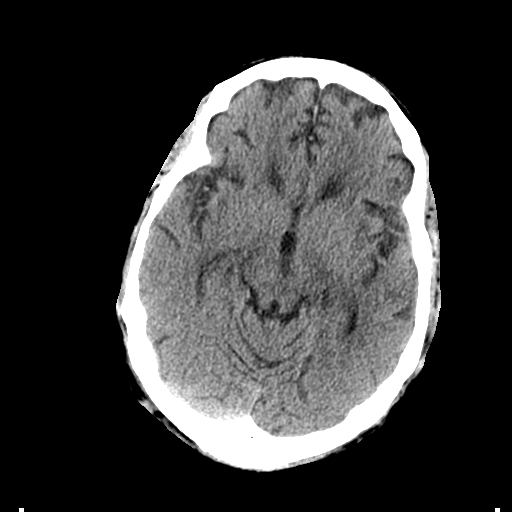
[im 16/32  brain]
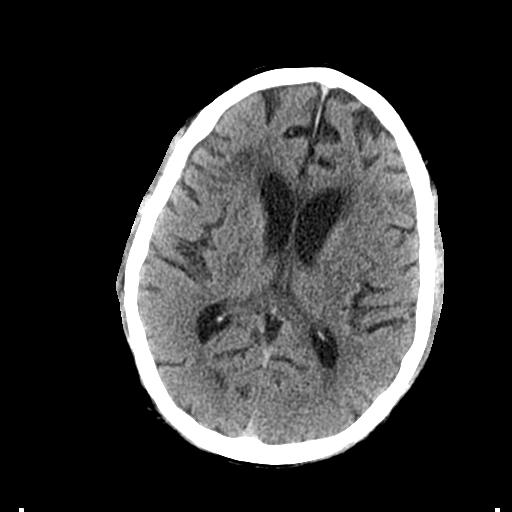
[im 20/32  brain]
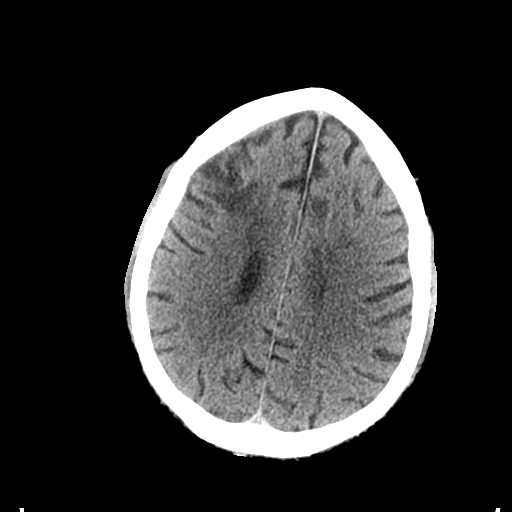
[im 20/32  bone]
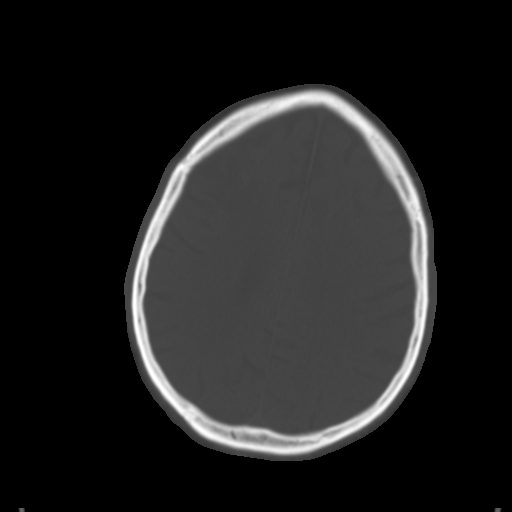
[im 24/32  brain]
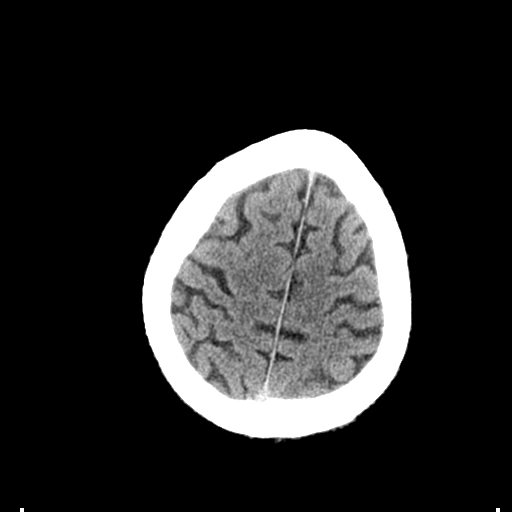
[im 28/32  brain]
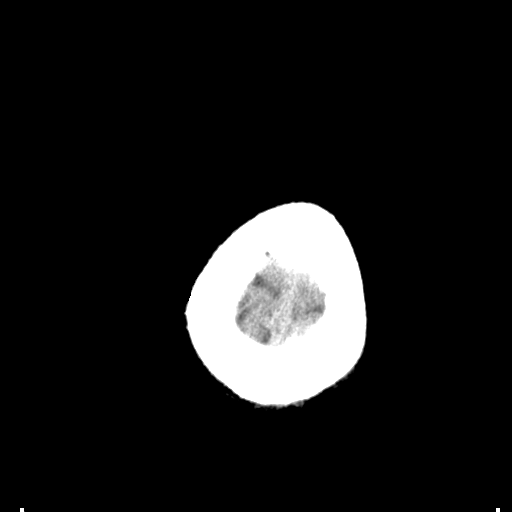

[Series 3: head bone · axial · 0.47mm/px · z∈[+362,+394]mm · 3 of 80 slices shown]
[im 8/80  bone]
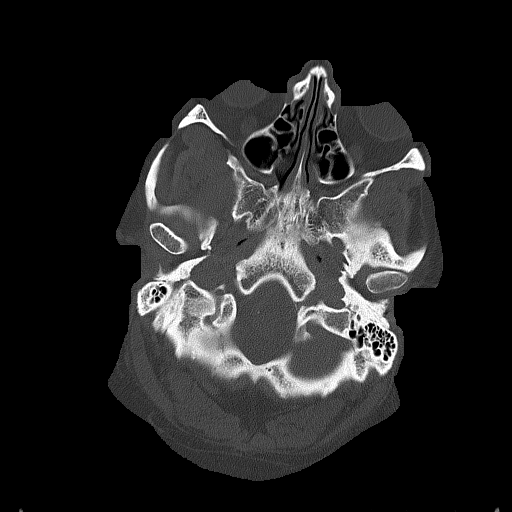
[im 16/80  bone]
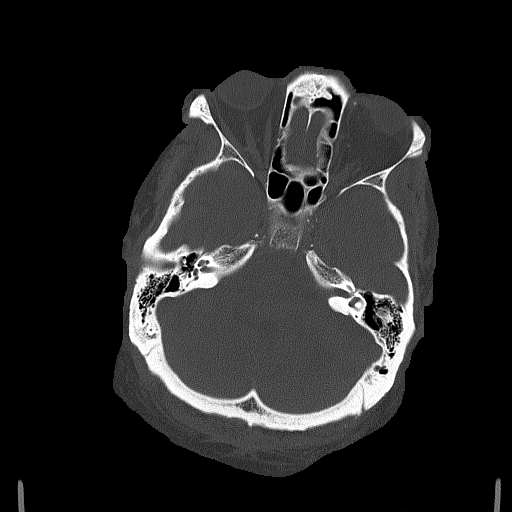
[im 24/80  bone]
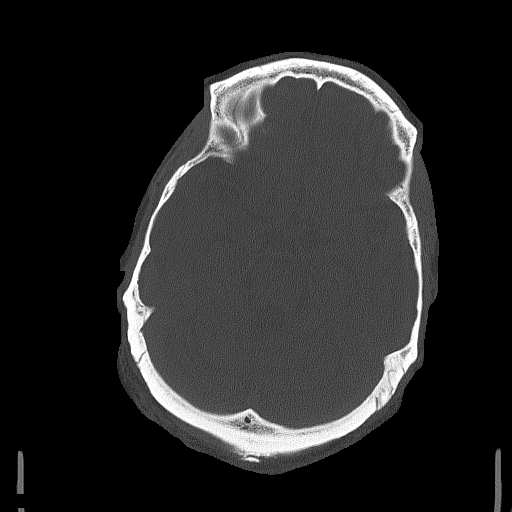

[Series 4: coronal soft tissue · coronal · 0.32mm/px · 3 of 72 slices shown]
[im 24/72  brain]
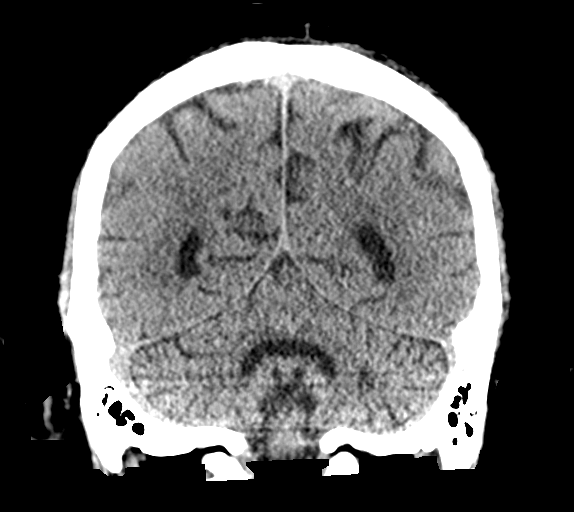
[im 32/72  brain]
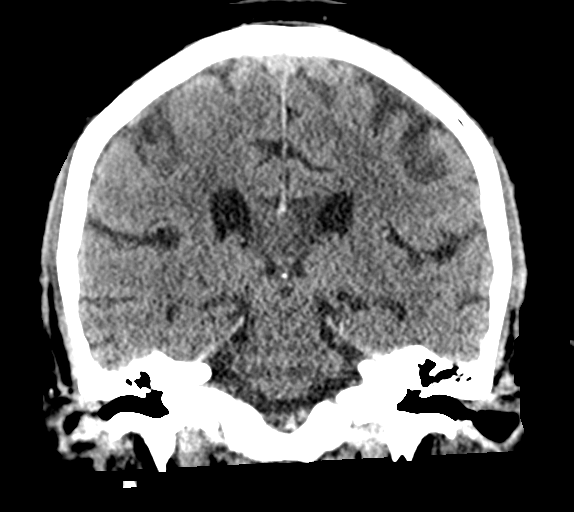
[im 40/72  brain]
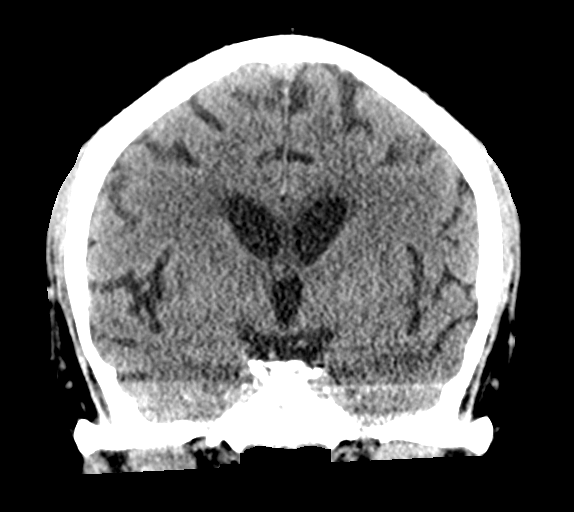

[Series 5: sagittal soft tissue · sagittal · 0.32mm/px · 3 of 59 slices shown]
[im 20/59  brain]
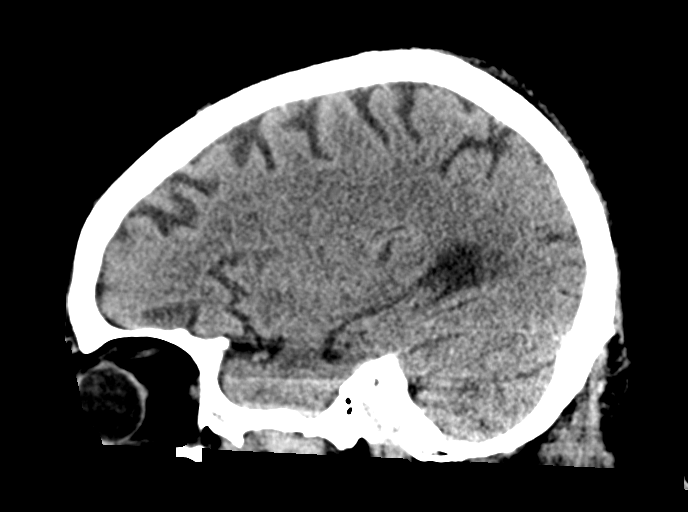
[im 30/59  brain]
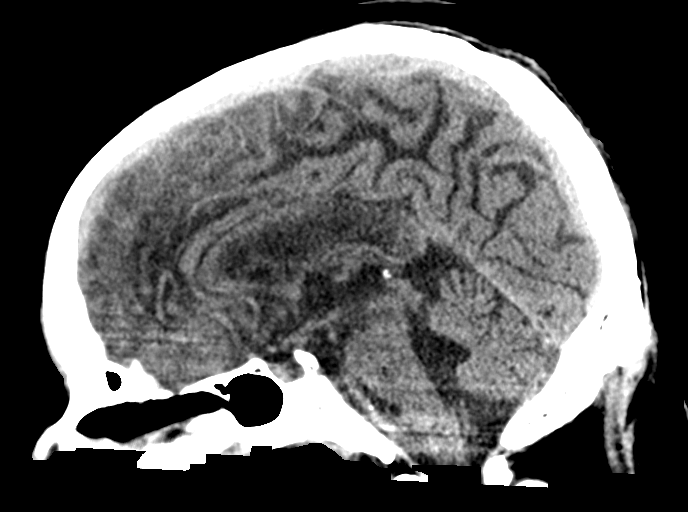
[im 39/59  brain]
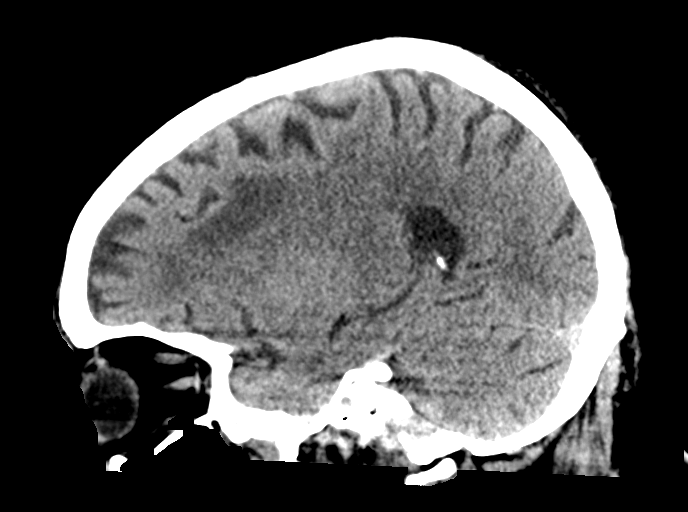

[16 of 47 positions shown; findings below may reference images not displayed]

BRAIN:
BRAIN
Patchy and confluent areas of decreased attenuation are noted
throughout the deep and periventricular white matter of the cerebral
hemispheres bilaterally, compatible with chronic microvascular
ischemic disease. Chronic left brainstem, thalamus, cerebral
infarctions.

No evidence of large-territorial acute infarction. No parenchymal
hemorrhage. No mass lesion. No extra-axial collection.

No mass effect or midline shift. No hydrocephalus. Basilar cisterns
are patent.

Vascular: No hyperdense vessel. Atherosclerotic calcifications are
present within the cavernous internal carotid arteries.

Skull: No acute fracture or focal lesion.

Sinuses/Orbits: Paranasal sinuses and mastoid air cells are clear.
The orbits are unremarkable.

Other: None.
IMPRESSION: No acute intracranial abnormality.

## 2020-12-02 MED ORDER — METRONIDAZOLE 500 MG PO TABS: 500.0000 mg | ORAL_TABLET | Freq: Three times a day (TID) | ORAL | 0 refills | Status: DC

## 2020-12-02 MED ORDER — SODIUM CHLORIDE 0.9 % IV BOLUS
1000.0000 mL | Freq: Once | INTRAVENOUS | Status: AC
  Administered 2020-12-02: 1000 mL via INTRAVENOUS

## 2020-12-02 MED ORDER — CEFDINIR 300 MG PO CAPS: 300.0000 mg | ORAL_CAPSULE | Freq: Two times a day (BID) | ORAL | 0 refills | Status: DC

## 2020-12-02 MED ORDER — SODIUM CHLORIDE 0.9 % IV SOLN
2.0000 g | Freq: Once | INTRAVENOUS | Status: AC
  Administered 2020-12-02: 2 g via INTRAVENOUS
  Filled 2020-12-02: qty 20

## 2020-12-02 MED ORDER — IOHEXOL 350 MG/ML SOLN
80.0000 mL | Freq: Once | INTRAVENOUS | Status: AC | PRN
  Administered 2020-12-02: 80 mL via INTRAVENOUS

## 2020-12-02 MED ORDER — METRONIDAZOLE 500 MG/100ML IV SOLN
500.0000 mg | Freq: Once | INTRAVENOUS | Status: AC
  Administered 2020-12-02: 500 mg via INTRAVENOUS
  Filled 2020-12-02: qty 100

## 2020-12-02 NOTE — ED Triage Notes (Signed)
Pt comes via EMS from home with c/o AMS and strong urine odor. Family reports pt altered. Pt did have recent stroke in August with nonverbal, right sided weakness and bedbound deficits.  BP-153/97 HR-106 RR-26 ETCO2-26 T-97.3 20g left arm with 300 cc fluids given.

## 2020-12-02 NOTE — ED Notes (Signed)
Pts daughter contacted at this time by request of EMS to ensure they would be present at home to receive pt when EMS arrives. Daughter confirms. Pts IV removed, pt loaded on stretcher and discharge paperwork given to EMS to hand over to family at home. Pt leaving in hospital gown and dry brief. No personal belongings to return, found.

## 2020-12-02 NOTE — ED Provider Notes (Signed)
[facility] Emergency Department Provider Note  ____________________________________________   Event Date/Time   First MD Initiated Contact with Patient 12/02/20 1758     (approximate)  I have reviewed the triage vital signs and the nursing notes.   HISTORY  Chief Complaint Altered Mental Status    HPI Raymond Kerr is a 65 y.o. male here from home with complaint of altered mental status.  Family reported the patient has been increasingly confused throughout the day today.  Patient has a recent stroke in August and is essentially bedbound.  He was found to be tachycardic and smelling of urine.  He has significant aphasia and is essentially unable to provide additional history.  He shakes his head no to any pain.  Remainder of history limited due to stroke and neurologic deficits.  No family present at my time of assessment.  Level 5 caveat invoked as remainder of history, ROS, and physical exam limited due to patient's mental status, stroke.     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   Hypotension 10/19/2020   Hypokalemia 10/17/2020   Hypomagnesemia 10/17/2020   Hypophosphatemia 10/17/2020   Pressure injury of skin 10/16/2020   Cerebrovascular accident (CVA) ([place])    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 ([place]) 10/07/2020   Paralysis of left third cranial nerve    Right sided weakness    TIA (transient ischemic attack) 10/06/2020   Acute ischemic left ACA stroke ([place]) 10/06/2020   Microhematuria 12/10/2016   Elevated PSA 12/10/2016   Diabetes mellitus ([place]) 11/28/2016   Benign essential  hypertension 10/20/2015    Past Surgical History:  Procedure Laterality Date   TEE WITHOUT CARDIOVERSION N/A 10/08/2020   Procedure: TRANSESOPHAGEAL ECHOCARDIOGRAM (TEE);  Surgeon: Corey Skains, MD;  Location: [facility] ORS;  Service: Cardiovascular;  Laterality: N/A;    Prior to Admission medications   Medication Sig Start Date End Date Taking? Authorizing Provider  amLODipine (NORVASC) 10 MG tablet Take 1 tablet (10 mg total) by mouth daily. 11/24/20  Yes Angiulli, Lavon Paganini, PA-C  aspirin 81 MG chewable tablet Chew 1 tablet (81 mg total) by mouth daily. 10/25/20  Yes Florencia Reasons, MD  atorvastatin (LIPITOR) 80 MG tablet Take 1 tablet (80 mg total) by mouth daily. 11/24/20  Yes Angiulli, Lavon Paganini, PA-C  B Complex Vitamins (VITAMIN B COMPLEX) TABS Take 1 tablet by mouth daily. 11/24/20  Yes Angiulli, Lavon Paganini, PA-C  blood glucose meter kit and supplies Dispense based on patient and insurance preference. Use up to four times daily as directed. (FOR ICD-10 E10.9, E11.9). 11/24/20  Yes Angiulli, Lavon Paganini, PA-C  Blood Glucose Monitoring Suppl (BLOOD GLUCOSE MONITOR SYSTEM) w/Device KIT Use as directed up to 4 times daily. 11/24/20  Yes Angiulli, Lavon Paganini, PA-C  cefdinir (OMNICEF) 300 MG capsule Take 1 capsule (300 mg total) by mouth 2 (two) times daily for 7 days. 12/02/20 12/09/20 Yes Duffy Bruce, MD  clopidogrel (PLAVIX) 75 MG tablet Take 1 tablet (75 mg total) by mouth daily. 11/24/20  Yes Angiulli, Lavon Paganini, PA-C  glimepiride (AMARYL) 2 MG tablet Take 1 tablet (  2 mg total) by mouth daily with breakfast. 11/24/20  Yes Angiulli, Lavon Paganini, PA-C  metFORMIN (GLUCOPHAGE) 500 MG tablet Take 1 tablet (500 mg total) by mouth 2 (two) times daily with a meal. 11/24/20  Yes Angiulli, Lavon Paganini, PA-C  methylphenidate (RITALIN) 10 MG tablet Take 1 tablet (10 mg total) by mouth 2 (two) times daily with breakfast and lunch. 11/24/20  Yes Angiulli, Lavon Paganini, PA-C  metroNIDAZOLE (FLAGYL) 500 MG tablet Take 1 tablet (500 mg  total) by mouth 3 (three) times daily for 7 days. 12/02/20 12/09/20 Yes Duffy Bruce, MD  polyethylene glycol (MIRALAX / GLYCOLAX) 17 g packet Take 17 g by mouth 2 (two) times daily. 11/24/20  Yes Angiulli, Lavon Paganini, PA-C  tamsulosin (FLOMAX) 0.4 MG CAPS capsule Take 1 capsule (0.4 mg total) by mouth daily after supper. 11/24/20  Yes Angiulli, Lavon Paganini, PA-C  traZODone (DESYREL) 50 MG tablet Take 0.5 tablets (25 mg total) by mouth at bedtime as needed for sleep. 11/24/20  Yes Angiulli, Lavon Paganini, PA-C  Vitamin D, Ergocalciferol, (DRISDOL) 1.25 MG (50000 UNIT) CAPS capsule Take 1 capsule (50,000 Units total) by mouth once a week. 11/28/20   Angiulli, Lavon Paganini, PA-C    Allergies Patient has no known allergies.  Family History  Problem Relation Age of Onset   Prostate cancer Neg Hx    Bladder Cancer Neg Hx    Kidney cancer Neg Hx     Social History Social History   Tobacco Use   Smoking status: Never   Smokeless tobacco: Never  Substance Use Topics   Alcohol use: Yes   Drug use: No    Review of Systems  Review of Systems  Unable to perform ROS: Mental status change    ____________________________________________  PHYSICAL EXAM:      VITAL SIGNS: ED Triage Vitals  Enc Vitals Group     BP      Pulse      Resp      Temp      Temp src      SpO2      Weight      Height      Head Circumference      Peak Flow      Pain Score      Pain Loc      Pain Edu?      Excl. in [place]?      Physical Exam Vitals and nursing note reviewed.  Constitutional:      General: He is not in acute distress.    Appearance: He is well-developed.  HENT:     Head: Normocephalic and atraumatic.     Mouth/Throat:     Mouth: Mucous membranes are dry.  Eyes:     Conjunctiva/sclera: Conjunctivae normal.  Cardiovascular:     Rate and Rhythm: Normal rate and regular rhythm.     Heart sounds: Normal heart sounds.  Pulmonary:     Effort: Pulmonary effort is normal. No respiratory distress.      Breath sounds: No wheezing.  Abdominal:     General: There is no distension.  Musculoskeletal:     Cervical back: Neck supple.  Skin:    General: Skin is warm.     Capillary Refill: Capillary refill takes less than 2 seconds.     Findings: No rash.  Neurological:     Mental Status: He is alert and oriented to person, place, and time.     Motor: No abnormal muscle tone.  Comments: L facial droop. RUE and RLE weakness/paralysis. Reportedly at baseline.      ____________________________________________   LABS (all labs ordered are listed, but only abnormal results are displayed)  Labs Reviewed  COMPREHENSIVE METABOLIC PANEL - Abnormal; Notable for the following components:      Result Value   Glucose, Bld 173 (*)    Albumin 3.3 (*)    AST 195 (*)    ALT 272 (*)    All other components within normal limits  URINALYSIS, ROUTINE W REFLEX MICROSCOPIC - Abnormal; Notable for the following components:   Color, Urine AMBER (*)    APPearance HAZY (*)    Ketones, ur 5 (*)    Protein, ur >=300 (*)    All other components within normal limits  CBG MONITORING, ED - Abnormal; Notable for the following components:   Glucose-Capillary 182 (*)    All other components within normal limits  RESP PANEL BY RT-PCR (FLU A&B, COVID) ARPGX2  CULTURE, BLOOD (SINGLE)  CBC  LACTIC ACID, PLASMA  LACTIC ACID, PLASMA    ____________________________________________ ________________________________________  RADIOLOGY All imaging, including plain films, CT scans, and ultrasounds, independently reviewed by me, and interpretations confirmed via formal radiology reads.  ED MD interpretation:   CXR: Clear CT Head: NAICA CT A/P: Acute diverticulitis, possible cystitis  Official radiology report(s): CT HEAD WO CONTRAST (5MM)  Result Date: 12/02/2020 CLINICAL DATA:  Mental status change.  Unknown cause. EXAM: CT HEAD WITHOUT CONTRAST TECHNIQUE: Contiguous axial images were obtained from the base  of the skull through the vertex without intravenous contrast. COMPARISON:  CT head 11/03/2020 BRAIN: BRAIN Patchy and confluent areas of decreased attenuation are noted throughout the deep and periventricular white matter of the cerebral hemispheres bilaterally, compatible with chronic microvascular ischemic disease. Chronic left brainstem, thalamus, cerebral infarctions. No evidence of large-territorial acute infarction. No parenchymal hemorrhage. No mass lesion. No extra-axial collection. No mass effect or midline shift. No hydrocephalus. Basilar cisterns are patent. Vascular: No hyperdense vessel. Atherosclerotic calcifications are present within the cavernous internal carotid arteries. Skull: No acute fracture or focal lesion. Sinuses/Orbits: Paranasal sinuses and mastoid air cells are clear. The orbits are unremarkable. Other: None. IMPRESSION: No acute intracranial abnormality. Electronically Signed   By: Iven Finn M.D.   On: 12/02/2020 20:11   CT ABDOMEN PELVIS W CONTRAST  Result Date: 12/02/2020 CLINICAL DATA:  Acute nonlocalized abdominal pain. Recent stroke with right-sided weakness. EXAM: CT ABDOMEN AND PELVIS WITH CONTRAST TECHNIQUE: Multidetector CT imaging of the abdomen and pelvis was performed using the standard protocol following bolus administration of intravenous contrast. CONTRAST:  69m OMNIPAQUE IOHEXOL 350 MG/ML SOLN COMPARISON:  None. FINDINGS: Lower chest: Linear atelectasis in the lung bases. Coronary artery calcifications. Hepatobiliary: The gallbladder is moderately distended without stone or wall thickening. No bile duct dilatation. No focal liver lesions. Pancreas: Unremarkable. No pancreatic ductal dilatation or surrounding inflammatory changes. Spleen: Normal in size without focal abnormality. Adrenals/Urinary Tract: No adrenal gland nodules. Multiple stones demonstrated in the upper pole of the right kidney measuring in total, 1.3 cm. Nephrograms are symmetrical. No  hydronephrosis or hydroureter. Bladder wall is diffusely thickened with heterogeneous increased density in the urine suggesting cystitis. Stomach/Bowel: Stomach, small bowel, and colon are not abnormally distended. Scattered stool within the colon. Sigmoid colonic diverticulosis with stranding around the sigmoid region at the junction of the descending and sigmoid. This likely represents early diverticulitis. However, the short segment of involvement suggest possible underlying neoplasm and follow-up after resolution of the  acute process is recommended. No proximal obstruction. Appendix is normal. Vascular/Lymphatic: Aortic atherosclerosis. No enlarged abdominal or pelvic lymph nodes. Reproductive: Prostate gland is markedly enlarged. Other: No free air or free fluid in the abdomen. Foci of infiltration in the anterior abdominal wall likely represent injection sites. Musculoskeletal: Degenerative changes in the spine. Degenerative changes in the hips and symphysis pubis. No destructive bone lesions. IMPRESSION: 1. Atelectasis in the lung bases. 2. Nonobstructing stones in the upper pole right kidney. 3. Sigmoid colonic diverticulosis with focal stranding suggesting early diverticulitis. No abscess. Follow-up after resolution of acute process recommended to exclude underlying neoplasm. 4. Aortic atherosclerosis. 5. Prostate gland is enlarged. 6. Bladder wall thickening and heterogeneous appearance of the urine likely representing cystitis. Electronically Signed   By: Lucienne Capers M.D.   On: 12/02/2020 21:11   DG Chest Portable 1 View  Result Date: 12/02/2020 CLINICAL DATA:  Shortness of breath EXAM: PORTABLE CHEST 1 VIEW COMPARISON:  Chest x-ray 10/06/2020 FINDINGS: The heart and mediastinal contours are unchanged. No focal consolidation. No pulmonary edema. No pleural effusion. No pneumothorax. No acute osseous abnormality. IMPRESSION: No active disease. Electronically Signed   By: Iven Finn M.D.    On: 12/02/2020 20:21    ____________________________________________  PROCEDURES   Procedure(s) performed (including Critical Care):  Procedures  ____________________________________________  INITIAL IMPRESSION / MDM / [place] / ED COURSE  As part of my medical decision making, I reviewed the following data within the [facility] notes reviewed and incorporated, Old chart reviewed, Notes from prior ED visits, and [place] Controlled Substance Database       *Ketih Goodie was evaluated in Emergency Department on 12/03/2020 for the symptoms described in the history of present illness. He was evaluated in the context of the global COVID-19 pandemic, which necessitated consideration that the patient might be at risk for infection with the SARS-CoV-2 virus that causes COVID-19. Institutional protocols and algorithms that pertain to the evaluation of patients at risk for COVID-19 are in a state of rapid change based on information released by regulatory bodies including the CDC and federal and state organizations. These policies and algorithms were followed during the patient's care in the ED.  Some ED evaluations and interventions may be delayed as a result of limited staffing during the pandemic.*     Medical Decision Making:  65 yo M here with reported ams, decreased PO intake per family. On arrival, pt is nontoxic, in NAD. Neuro status is at baseline per family. Labs reviewed. CBC with no leukocytosis, anemia. CMP shows chronic transaminitis which has been noted during prior admissions, is at baseline. LA normal. UA with ketonuria likely 2/2 dehydration but no signs of UTI. CT head negative, and pt has no new focal deficits. CXR is clear.   Based on discussion with family, primary issue has been slightly poor PO intake and possible abd discomfort. CT A/P subsequently obtained and does show mild diverticulitis, question of cystitis. Pt is otherwise nontoxic, well  appearing. Abdomen is soft. Pt given ABX here, will d/c with course of ABX and outpt follow-up. Suspect mild diverticulitis with subsequent decreased PO intake. Received fluids here, ABX.   ____________________________________________  FINAL CLINICAL IMPRESSION(S) / ED DIAGNOSES  Final diagnoses:  Diverticulitis  Dehydration     MEDICATIONS GIVEN DURING THIS VISIT:  Medications  sodium chloride 0.9 % bolus 1,000 mL (0 mLs Intravenous Stopped 12/02/20 2308)  iohexol (OMNIPAQUE) 350 MG/ML injection 80 mL (80 mLs Intravenous Contrast Given  12/02/20 2050)  cefTRIAXone (ROCEPHIN) 2 g in sodium chloride 0.9 % 100 mL IVPB (0 g Intravenous Stopped 12/02/20 2216)  metroNIDAZOLE (FLAGYL) IVPB 500 mg (0 mg Intravenous Stopped 12/02/20 2308)     ED Discharge Orders          Ordered    metroNIDAZOLE (FLAGYL) 500 MG tablet  3 times daily        12/02/20 2308    cefdinir (OMNICEF) 300 MG capsule  2 times daily        12/02/20 2308             Note:  This document was prepared using Dragon voice recognition software and may include unintentional dictation errors.   Duffy Bruce, MD 12/03/20 [phone]

## 2020-12-02 NOTE — ED Notes (Signed)
ERMD at bedside at this time 

## 2020-12-03 ENCOUNTER — Encounter: Payer: Self-pay | Admitting: Emergency Medicine

## 2020-12-03 ENCOUNTER — Telehealth: Payer: Self-pay | Admitting: Emergency Medicine

## 2020-12-03 ENCOUNTER — Inpatient Hospital Stay
Admission: EM | Admit: 2020-12-03 | Discharge: 2020-12-13 | DRG: 392 | Disposition: A | Discharge status: Home Health | Payer: Medicaid Other | Attending: Internal Medicine | Admitting: Internal Medicine

## 2020-12-03 ENCOUNTER — Other Ambulatory Visit: Payer: Self-pay

## 2020-12-03 DIAGNOSIS — R29810 Facial weakness: Secondary | ICD-10-CM | POA: Diagnosis present

## 2020-12-03 DIAGNOSIS — B958 Unspecified staphylococcus as the cause of diseases classified elsewhere: Secondary | ICD-10-CM | POA: Insufficient documentation

## 2020-12-03 DIAGNOSIS — I69351 Hemiplegia and hemiparesis following cerebral infarction affecting right dominant side: Secondary | ICD-10-CM

## 2020-12-03 DIAGNOSIS — R630 Anorexia: Secondary | ICD-10-CM

## 2020-12-03 DIAGNOSIS — E785 Hyperlipidemia, unspecified: Secondary | ICD-10-CM | POA: Diagnosis present

## 2020-12-03 DIAGNOSIS — K5792 Diverticulitis of intestine, part unspecified, without perforation or abscess without bleeding: Secondary | ICD-10-CM | POA: Diagnosis present

## 2020-12-03 DIAGNOSIS — N400 Enlarged prostate without lower urinary tract symptoms: Secondary | ICD-10-CM | POA: Diagnosis present

## 2020-12-03 DIAGNOSIS — I639 Cerebral infarction, unspecified: Secondary | ICD-10-CM | POA: Diagnosis not present

## 2020-12-03 DIAGNOSIS — B370 Candidal stomatitis: Secondary | ICD-10-CM | POA: Diagnosis present

## 2020-12-03 DIAGNOSIS — E1169 Type 2 diabetes mellitus with other specified complication: Secondary | ICD-10-CM | POA: Diagnosis present

## 2020-12-03 DIAGNOSIS — Z7401 Bed confinement status: Secondary | ICD-10-CM

## 2020-12-03 DIAGNOSIS — R7881 Bacteremia: Secondary | ICD-10-CM | POA: Diagnosis present

## 2020-12-03 DIAGNOSIS — Z515 Encounter for palliative care: Secondary | ICD-10-CM | POA: Diagnosis not present

## 2020-12-03 DIAGNOSIS — R7989 Other specified abnormal findings of blood chemistry: Secondary | ICD-10-CM

## 2020-12-03 DIAGNOSIS — Z7982 Long term (current) use of aspirin: Secondary | ICD-10-CM | POA: Diagnosis not present

## 2020-12-03 DIAGNOSIS — Z79899 Other long term (current) drug therapy: Secondary | ICD-10-CM

## 2020-12-03 DIAGNOSIS — K219 Gastro-esophageal reflux disease without esophagitis: Secondary | ICD-10-CM | POA: Diagnosis present

## 2020-12-03 DIAGNOSIS — I69320 Aphasia following cerebral infarction: Secondary | ICD-10-CM

## 2020-12-03 DIAGNOSIS — Z20822 Contact with and (suspected) exposure to covid-19: Secondary | ICD-10-CM | POA: Diagnosis present

## 2020-12-03 DIAGNOSIS — E876 Hypokalemia: Secondary | ICD-10-CM | POA: Diagnosis not present

## 2020-12-03 DIAGNOSIS — Z7902 Long term (current) use of antithrombotics/antiplatelets: Secondary | ICD-10-CM

## 2020-12-03 DIAGNOSIS — I10 Essential (primary) hypertension: Secondary | ICD-10-CM | POA: Diagnosis present

## 2020-12-03 DIAGNOSIS — Z7984 Long term (current) use of oral hypoglycemic drugs: Secondary | ICD-10-CM

## 2020-12-03 DIAGNOSIS — Z6830 Body mass index (BMI) 30.0-30.9, adult: Secondary | ICD-10-CM

## 2020-12-03 DIAGNOSIS — I69951 Hemiplegia and hemiparesis following unspecified cerebrovascular disease affecting right dominant side: Secondary | ICD-10-CM

## 2020-12-03 DIAGNOSIS — R638 Other symptoms and signs concerning food and fluid intake: Secondary | ICD-10-CM | POA: Diagnosis not present

## 2020-12-03 DIAGNOSIS — E440 Moderate protein-calorie malnutrition: Secondary | ICD-10-CM | POA: Diagnosis present

## 2020-12-03 DIAGNOSIS — Z0189 Encounter for other specified special examinations: Secondary | ICD-10-CM

## 2020-12-03 DIAGNOSIS — N200 Calculus of kidney: Secondary | ICD-10-CM | POA: Diagnosis present

## 2020-12-03 DIAGNOSIS — R627 Adult failure to thrive: Secondary | ICD-10-CM | POA: Diagnosis present

## 2020-12-03 DIAGNOSIS — I69391 Dysphagia following cerebral infarction: Secondary | ICD-10-CM | POA: Diagnosis not present

## 2020-12-03 DIAGNOSIS — R591 Generalized enlarged lymph nodes: Secondary | ICD-10-CM | POA: Diagnosis present

## 2020-12-03 DIAGNOSIS — Z931 Gastrostomy status: Secondary | ICD-10-CM

## 2020-12-03 DIAGNOSIS — Z7189 Other specified counseling: Secondary | ICD-10-CM | POA: Diagnosis not present

## 2020-12-03 DIAGNOSIS — E119 Type 2 diabetes mellitus without complications: Secondary | ICD-10-CM

## 2020-12-03 DIAGNOSIS — I700 Atherosclerosis of aorta: Secondary | ICD-10-CM | POA: Diagnosis present

## 2020-12-03 LAB — COMPREHENSIVE METABOLIC PANEL
ALT: 241 U/L — ABNORMAL HIGH (ref 0–44)
AST: 172 U/L — ABNORMAL HIGH (ref 15–41)
Albumin: 3.2 g/dL — ABNORMAL LOW (ref 3.5–5.0)
Alkaline Phosphatase: 70 U/L (ref 38–126)
Anion gap: 10 (ref 5–15)
BUN: 14 mg/dL (ref 8–23)
CO2: 21 mmol/L — ABNORMAL LOW (ref 22–32)
Calcium: 9.4 mg/dL (ref 8.9–10.3)
Chloride: 104 mmol/L (ref 98–111)
Creatinine, Ser: 1.01 mg/dL (ref 0.61–1.24)
GFR, Estimated: >60 mL/min (ref >60)
Glucose, Bld: 208 mg/dL — ABNORMAL HIGH (ref 70–99)
Potassium: 3.5 mmol/L (ref 3.5–5.1)
Sodium: 135 mmol/L (ref 135–145)
Total Bilirubin: 0.8 mg/dL (ref 0.3–1.2)
Total Protein: 7.5 g/dL (ref 6.5–8.1)

## 2020-12-03 LAB — BLOOD CULTURE ID PANEL (REFLEXED) - BCID2

## 2020-12-03 LAB — CBC WITH DIFFERENTIAL/PLATELET
Abs Immature Granulocytes: 0.02 10*3/uL (ref 0.00–0.07)
Basophils Absolute: 0 10*3/uL (ref 0.0–0.1)
Basophils Relative: 0 %
Eosinophils Absolute: 0.1 10*3/uL (ref 0.0–0.5)
Eosinophils Relative: 1 %
HCT: 38.7 % — ABNORMAL LOW (ref 39.0–52.0)
Hemoglobin: 13.2 g/dL (ref 13.0–17.0)
Immature Granulocytes: 0 %
Lymphocytes Relative: 23 %
Lymphs Abs: 1.7 10*3/uL (ref 0.7–4.0)
MCH: 29.7 pg (ref 26.0–34.0)
MCHC: 34.1 g/dL (ref 30.0–36.0)
MCV: 87.2 fL (ref 80.0–100.0)
Monocytes Absolute: 0.4 10*3/uL (ref 0.1–1.0)
Monocytes Relative: 6 %
Neutro Abs: 5.4 10*3/uL (ref 1.7–7.7)
Neutrophils Relative %: 70 %
Platelets: 365 10*3/uL (ref 150–400)
RBC: 4.44 MIL/uL (ref 4.22–5.81)
RDW: 14.8 % (ref 11.5–15.5)
WBC: 7.7 10*3/uL (ref 4.0–10.5)
nRBC: 0 % (ref 0.0–0.2)

## 2020-12-03 LAB — RESP PANEL BY RT-PCR (FLU A&B, COVID) ARPGX2
Influenza A by PCR: NEGATIVE
Influenza B by PCR: NEGATIVE
SARS Coronavirus 2 by RT PCR: NEGATIVE

## 2020-12-03 LAB — LACTIC ACID, PLASMA
Lactic Acid, Venous: 1.3 mmol/L (ref 0.5–1.9)
Lactic Acid, Venous: 1.4 mmol/L (ref 0.5–1.9)

## 2020-12-03 LAB — APTT: aPTT: 30 seconds (ref 24–36)

## 2020-12-03 LAB — PROTIME-INR
INR: 1.1 (ref 0.8–1.2)
Prothrombin Time: 14.5 seconds (ref 11.4–15.2)

## 2020-12-03 LAB — PROCALCITONIN: Procalcitonin: <0.1 ng/mL

## 2020-12-03 LAB — MRSA NEXT GEN BY PCR, NASAL: MRSA by PCR Next Gen: NOT DETECTED

## 2020-12-03 MED ORDER — TRAZODONE HCL 50 MG PO TABS
25.0000 mg | ORAL_TABLET | Freq: Every evening | ORAL | Status: DC | PRN
  Administered 2020-12-09 – 2020-12-12 (×3): 25 mg via ORAL
  Filled 2020-12-03 (×3): qty 1

## 2020-12-03 MED ORDER — ACETAMINOPHEN 325 MG PO TABS
650.0000 mg | ORAL_TABLET | Freq: Four times a day (QID) | ORAL | Status: DC | PRN
  Administered 2020-12-10: 22:00:00 650 mg via ORAL
  Filled 2020-12-03: qty 2

## 2020-12-03 MED ORDER — B COMPLEX-C PO TABS
1.0000 | ORAL_TABLET | Freq: Every day | ORAL | Status: DC
  Administered 2020-12-04 – 2020-12-12 (×9): 1 via ORAL
  Filled 2020-12-03 (×11): qty 1

## 2020-12-03 MED ORDER — ACETAMINOPHEN 650 MG RE SUPP: 650.0000 mg | Freq: Four times a day (QID) | RECTAL | Status: DC | PRN

## 2020-12-03 MED ORDER — SODIUM CHLORIDE 0.9 % IV SOLN
2.0000 g | INTRAVENOUS | Status: DC
  Administered 2020-12-04 – 2020-12-06 (×3): 2 g via INTRAVENOUS
  Filled 2020-12-03: qty 2
  Filled 2020-12-03 (×2): qty 20
  Filled 2020-12-03: qty 2
  Filled 2020-12-03 (×2): qty 20

## 2020-12-03 MED ORDER — INSULIN ASPART 100 UNIT/ML IJ SOLN
0.0000 [IU] | Freq: Three times a day (TID) | INTRAMUSCULAR | Status: DC
  Administered 2020-12-04 (×2): 2 [IU] via SUBCUTANEOUS
  Administered 2020-12-05 – 2020-12-07 (×6): 3 [IU] via SUBCUTANEOUS
  Administered 2020-12-07: 5 [IU] via SUBCUTANEOUS
  Administered 2020-12-08 (×3): 3 [IU] via SUBCUTANEOUS
  Administered 2020-12-09 (×2): 5 [IU] via SUBCUTANEOUS
  Administered 2020-12-09: 17:00:00 3 [IU] via SUBCUTANEOUS
  Administered 2020-12-10 (×2): 5 [IU] via SUBCUTANEOUS
  Administered 2020-12-10: 3 [IU] via SUBCUTANEOUS
  Administered 2020-12-11: 5 [IU] via SUBCUTANEOUS
  Administered 2020-12-11 (×2): 3 [IU] via SUBCUTANEOUS
  Administered 2020-12-12 (×3): 5 [IU] via SUBCUTANEOUS
  Administered 2020-12-13 (×3): 3 [IU] via SUBCUTANEOUS
  Filled 2020-12-03 (×23): qty 1

## 2020-12-03 MED ORDER — TAMSULOSIN HCL 0.4 MG PO CAPS
0.4000 mg | ORAL_CAPSULE | Freq: Every day | ORAL | Status: DC
  Administered 2020-12-04 – 2020-12-12 (×8): 0.4 mg via ORAL
  Filled 2020-12-03 (×9): qty 1

## 2020-12-03 MED ORDER — ONDANSETRON HCL 4 MG/2ML IJ SOLN: 4.0000 mg | Freq: Four times a day (QID) | INTRAMUSCULAR | Status: DC | PRN

## 2020-12-03 MED ORDER — VITAMIN B COMPLEX PO TABS: 1.0000 | ORAL_TABLET | Freq: Every day | ORAL | Status: DC

## 2020-12-03 MED ORDER — METRONIDAZOLE 500 MG PO TABS
500.0000 mg | ORAL_TABLET | Freq: Two times a day (BID) | ORAL | Status: DC
  Administered 2020-12-04 – 2020-12-06 (×4): 500 mg via ORAL
  Filled 2020-12-03 (×5): qty 1

## 2020-12-03 MED ORDER — INSULIN ASPART 100 UNIT/ML IJ SOLN
0.0000 [IU] | INTRAMUSCULAR | Status: DC
  Administered 2020-12-04: 2 [IU] via SUBCUTANEOUS
  Administered 2020-12-04: 3 [IU] via SUBCUTANEOUS
  Filled 2020-12-03 (×2): qty 1

## 2020-12-03 MED ORDER — INSULIN ASPART 100 UNIT/ML IJ SOLN
0.0000 [IU] | Freq: Every day | INTRAMUSCULAR | Status: DC
  Administered 2020-12-08 – 2020-12-10 (×2): 2 [IU] via SUBCUTANEOUS
  Filled 2020-12-03 (×2): qty 1

## 2020-12-03 MED ORDER — METHYLPHENIDATE HCL 10 MG PO TABS
10.0000 mg | ORAL_TABLET | Freq: Two times a day (BID) | ORAL | Status: DC
  Administered 2020-12-04 – 2020-12-12 (×15): 10 mg via ORAL
  Filled 2020-12-03 (×9): qty 1
  Filled 2020-12-03: qty 2
  Filled 2020-12-03: qty 1
  Filled 2020-12-03: qty 2
  Filled 2020-12-03 (×4): qty 1

## 2020-12-03 MED ORDER — LACTATED RINGERS IV BOLUS
1000.0000 mL | Freq: Once | INTRAVENOUS | Status: AC
  Administered 2020-12-03: 1000 mL via INTRAVENOUS

## 2020-12-03 MED ORDER — CLOPIDOGREL BISULFATE 75 MG PO TABS
75.0000 mg | ORAL_TABLET | Freq: Every day | ORAL | Status: DC
  Administered 2020-12-04 – 2020-12-05 (×2): 75 mg via ORAL
  Filled 2020-12-03 (×2): qty 1

## 2020-12-03 MED ORDER — ATORVASTATIN CALCIUM 20 MG PO TABS
80.0000 mg | ORAL_TABLET | Freq: Every day | ORAL | Status: DC
  Administered 2020-12-04 – 2020-12-05 (×2): 80 mg via ORAL
  Filled 2020-12-03 (×2): qty 4

## 2020-12-03 MED ORDER — PIPERACILLIN-TAZOBACTAM 3.375 G IVPB 30 MIN
3.3750 g | Freq: Once | INTRAVENOUS | Status: AC
  Administered 2020-12-03: 3.375 g via INTRAVENOUS
  Filled 2020-12-03: qty 50

## 2020-12-03 MED ORDER — AMLODIPINE BESYLATE 10 MG PO TABS
10.0000 mg | ORAL_TABLET | Freq: Every day | ORAL | Status: DC
  Administered 2020-12-04 – 2020-12-13 (×10): 10 mg via ORAL
  Filled 2020-12-03: qty 2
  Filled 2020-12-03 (×9): qty 1

## 2020-12-03 MED ORDER — ENOXAPARIN SODIUM 60 MG/0.6ML IJ SOSY
50.0000 mg | PREFILLED_SYRINGE | Freq: Every day | INTRAMUSCULAR | Status: DC
  Administered 2020-12-04 – 2020-12-08 (×6): 50 mg via SUBCUTANEOUS
  Filled 2020-12-03 (×6): qty 0.6

## 2020-12-03 MED ORDER — ONDANSETRON HCL 4 MG PO TABS: 4.0000 mg | ORAL_TABLET | Freq: Four times a day (QID) | ORAL | Status: DC | PRN

## 2020-12-03 MED ORDER — ASPIRIN 81 MG PO CHEW
81.0000 mg | CHEWABLE_TABLET | Freq: Every day | ORAL | Status: DC
  Administered 2020-12-04 – 2020-12-09 (×6): 81 mg via ORAL
  Filled 2020-12-03 (×6): qty 1

## 2020-12-03 MED ORDER — VANCOMYCIN HCL 2000 MG/400ML IV SOLN
2000.0000 mg | Freq: Once | INTRAVENOUS | Status: AC
  Administered 2020-12-03: 2000 mg via INTRAVENOUS
  Filled 2020-12-03: qty 400

## 2020-12-03 MED ORDER — VANCOMYCIN HCL 750 MG/150ML IV SOLN
750.0000 mg | Freq: Two times a day (BID) | INTRAVENOUS | Status: DC
  Administered 2020-12-04 – 2020-12-06 (×5): 750 mg via INTRAVENOUS
  Filled 2020-12-03 (×7): qty 150

## 2020-12-03 NOTE — H&P (Addendum)
History and Physical    Raymond Kerr TAV:[id number] DOB: May 22, 1955 DOA: 12/03/2020  PCP: Tracie Harrier, MD   Patient coming from: home  I have personally briefly reviewed patient's old medical records in [place]  Chief Complaint: abnormal blood culture/ diverticulitis on 10/11  HPI: Raymond Kerr is a 65 y.o. 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  History limited due to nonverbal status but able to nod and shake his head and answer to questions.  Patient has had no nausea or vomiting or fevers, cough or shortness of breath but over the past several days did appear to wince when eating.  ED course: On arrival temperature 99 with pulse 104, BP 146/98, O2 sat 100% on room air Blood work WBC normal at 7.7 with lactic acid 1.3.  Urinalysis unremarkable.  CMP notable for blood glucose 173, AST 195 and ALT 272 without other abnormalities  EKG, personally viewed and interpreted: Sinus tachycardia at 106 with no acute ST-T wave changes  Imaging from 12/02/2020: CT abdomen and pelvis withshowed early diverticulitis.  CT head with no acute intracranial abnormality and chest x-ray no acute disease  Patient treated with vancomycin and Zosyn.  Hospitalist consulted for admission.  Review of Systems: Limited due to aphasia.    Past Medical History:  Diagnosis Date   Diabetes mellitus without complication (HCC)    GERD (gastroesophageal reflux disease)    Hypertension    Stroke [facility])     Past Surgical History:  Procedure Laterality Date   TEE WITHOUT CARDIOVERSION N/A 10/08/2020   Procedure: TRANSESOPHAGEAL ECHOCARDIOGRAM (TEE);  Surgeon: Corey Skains, MD;   Location: [facility] ORS;  Service: Cardiovascular;  Laterality: N/A;     reports that he has never smoked. He has never used smokeless tobacco. He reports current alcohol use. He reports that he does not use drugs.  No Known Allergies  Family History  Problem Relation Age of Onset   Prostate cancer Neg Hx    Bladder Cancer Neg Hx    Kidney cancer Neg Hx       Prior to Admission medications   Medication Sig Start Date End Date Taking? Authorizing Provider  amLODipine (NORVASC) 10 MG tablet Take 1 tablet (10 mg total) by mouth daily. 11/24/20  Yes Angiulli, Lavon Paganini, PA-C  aspirin 81 MG chewable tablet Chew 1 tablet (81 mg total) by mouth daily. 10/25/20  Yes Florencia Reasons, MD  atorvastatin (LIPITOR) 80 MG tablet Take 1 tablet (80 mg total) by mouth daily. 11/24/20  Yes Angiulli, Lavon Paganini, PA-C  B Complex Vitamins (VITAMIN B COMPLEX) TABS Take 1 tablet by mouth daily. 11/24/20  Yes Angiulli, Lavon Paganini, PA-C  blood glucose meter kit and supplies Dispense based on patient and insurance preference. Use up to four times daily as directed. (FOR ICD-10 E10.9, E11.9). 11/24/20  Yes Angiulli, Lavon Paganini, PA-C  Blood Glucose Monitoring Suppl (BLOOD GLUCOSE MONITOR SYSTEM) w/Device KIT Use as directed up to 4 times daily. 11/24/20  Yes Angiulli, Lavon Paganini, PA-C  cefdinir (OMNICEF) 300 MG capsule Take 1 capsule (300 mg total) by mouth 2 (two) times daily for 7 days. 12/02/20 12/09/20 Yes Duffy Bruce, MD  clopidogrel (PLAVIX) 75 MG tablet  Take 1 tablet (75 mg total) by mouth daily. 11/24/20  Yes Angiulli, Lavon Paganini, PA-C  glimepiride (AMARYL) 2 MG tablet Take 1 tablet (2 mg total) by mouth daily with breakfast. 11/24/20  Yes Angiulli, Lavon Paganini, PA-C  metFORMIN (GLUCOPHAGE) 500 MG tablet Take 1 tablet (500 mg total) by mouth 2 (two) times daily with a meal. 11/24/20  Yes Angiulli, Lavon Paganini, PA-C  methylphenidate (RITALIN) 10 MG tablet Take 1 tablet (10 mg total) by mouth 2 (two) times daily with breakfast and lunch. 11/24/20   Yes Angiulli, Lavon Paganini, PA-C  metroNIDAZOLE (FLAGYL) 500 MG tablet Take 1 tablet (500 mg total) by mouth 3 (three) times daily for 7 days. 12/02/20 12/09/20 Yes Duffy Bruce, MD  polyethylene glycol (MIRALAX / GLYCOLAX) 17 g packet Take 17 g by mouth 2 (two) times daily. 11/24/20  Yes Angiulli, Lavon Paganini, PA-C  tamsulosin (FLOMAX) 0.4 MG CAPS capsule Take 1 capsule (0.4 mg total) by mouth daily after supper. 11/24/20  Yes Angiulli, Lavon Paganini, PA-C  traZODone (DESYREL) 50 MG tablet Take 0.5 tablets (25 mg total) by mouth at bedtime as needed for sleep. 11/24/20  Yes Angiulli, Lavon Paganini, PA-C  Vitamin D, Ergocalciferol, (DRISDOL) 1.25 MG (50000 UNIT) CAPS capsule Take 1 capsule (50,000 Units total) by mouth once a week. 11/28/20  Yes Cathlyn Parsons, PA-C    Physical Exam: Vitals:   12/03/20 1952 12/03/20 1955 12/03/20 2042  BP: (!) 146/98  (!) 146/86  Pulse: (!) 104    Resp: 20  16  Temp: 99 F (37.2 C)  98.4 F (36.9 C)  TempSrc: Oral  Oral  SpO2: 100%  99%  Weight:  102.7 kg   Height:  6' (1.829 m)      Vitals:   12/03/20 1952 12/03/20 1955 12/03/20 2042  BP: (!) 146/98  (!) 146/86  Pulse: (!) 104    Resp: 20  16  Temp: 99 F (37.2 C)  98.4 F (36.9 C)  TempSrc: Oral  Oral  SpO2: 100%  99%  Weight:  102.7 kg   Height:  6' (1.829 m)    Constitutional:      General: He is not in acute distress.    Appearance: He is well-developed.  HENT:     Head: Normocephalic and atraumatic.     Mouth/Throat:     Mouth: Mucous membranes are dry.  Eyes:     Conjunctiva/sclera: Conjunctivae normal.  Cardiovascular:     Rate and Rhythm: Normal rate and regular rhythm.     Heart sounds: Normal heart sounds.  Pulmonary:     Effort: Pulmonary effort is normal. No respiratory distress.     Breath sounds: No wheezing.  Abdominal:     General: There is no distension.  Musculoskeletal:     Cervical back: Neck supple.  Skin:    General: Skin is warm.     Capillary Refill: Capillary  refill takes less than 2 seconds.     Findings: No rash.  Neurological:     Mental Status: He is alert difficult to assess orientation due to aphasia    Motor: No abnormal muscle tone.     Comments: L facial droop. RUE and RLE weakness/paralysis. Reportedly at baseline.   Labs on Admission: I have personally reviewed following labs and imaging studies  CBC: Recent Labs  Lab 12/02/20 1819 12/03/20 2006  WBC 8.5 7.7  NEUTROABS  --  5.4  HGB 13.5 13.2  HCT 40.0 38.7*  MCV 86.4 87.2  PLT 374 505   Basic Metabolic Panel: Recent Labs  Lab 12/02/20 1819 12/03/20 2006  NA 138 135  K 3.9 3.5  CL 105 104  CO2 25 21*  GLUCOSE 173* 208*  BUN 15 14  CREATININE 0.99 1.01  CALCIUM 9.2 9.4   GFR: Estimated Creatinine Clearance: 90.3 mL/min (by C-G formula based on SCr of 1.01 mg/dL). Liver Function Tests: Recent Labs  Lab 12/02/20 1819 12/03/20 2006  AST 195* 172*  ALT 272* 241*  ALKPHOS 68 70  BILITOT 1.1 0.8  PROT 7.7 7.5  ALBUMIN 3.3* 3.2*   No results for input(s): LIPASE, AMYLASE in the last 168 hours. No results for input(s): AMMONIA in the last 168 hours. Coagulation Profile: No results for input(s): INR, PROTIME in the last 168 hours. Cardiac Enzymes: No results for input(s): CKTOTAL, CKMB, CKMBINDEX, TROPONINI in the last 168 hours. BNP (last 3 results) No results for input(s): PROBNP in the last 8760 hours. HbA1C: No results for input(s): HGBA1C in the last 72 hours. CBG: Recent Labs  Lab 12/02/20 1828  GLUCAP 182*   Lipid Profile: No results for input(s): CHOL, HDL, LDLCALC, TRIG, CHOLHDL, LDLDIRECT in the last 72 hours. Thyroid Function Tests: No results for input(s): TSH, T4TOTAL, FREET4, T3FREE, THYROIDAB in the last 72 hours. Anemia Panel: No results for input(s): VITAMINB12, FOLATE, FERRITIN, TIBC, IRON, RETICCTPCT in the last 72 hours. Urine analysis:    Component Value Date/Time   COLORURINE AMBER (A) 12/02/2020 1802   APPEARANCEUR HAZY (A)  12/02/2020 1802   APPEARANCEUR Clear 12/10/2016 1019   LABSPEC 1.029 12/02/2020 1802   PHURINE 5.0 12/02/2020 1802   GLUCOSEU NEGATIVE 12/02/2020 1802   HGBUR NEGATIVE 12/02/2020 1802   BILIRUBINUR NEGATIVE 12/02/2020 1802   BILIRUBINUR Negative 12/10/2016 1019   KETONESUR 5 (A) 12/02/2020 1802   PROTEINUR >=300 (A) 12/02/2020 1802   NITRITE NEGATIVE 12/02/2020 1802   LEUKOCYTESUR NEGATIVE 12/02/2020 1802    Radiological Exams on Admission: CT HEAD WO CONTRAST (5MM)  Result Date: 12/02/2020 CLINICAL DATA:  Mental status change.  Unknown cause. EXAM: CT HEAD WITHOUT CONTRAST TECHNIQUE: Contiguous axial images were obtained from the base of the skull through the vertex without intravenous contrast. COMPARISON:  CT head 11/03/2020 BRAIN: BRAIN Patchy and confluent areas of decreased attenuation are noted throughout the deep and periventricular white matter of the cerebral hemispheres bilaterally, compatible with chronic microvascular ischemic disease. Chronic left brainstem, thalamus, cerebral infarctions. No evidence of large-territorial acute infarction. No parenchymal hemorrhage. No mass lesion. No extra-axial collection. No mass effect or midline shift. No hydrocephalus. Basilar cisterns are patent. Vascular: No hyperdense vessel. Atherosclerotic calcifications are present within the cavernous internal carotid arteries. Skull: No acute fracture or focal lesion. Sinuses/Orbits: Paranasal sinuses and mastoid air cells are clear. The orbits are unremarkable. Other: None. IMPRESSION: No acute intracranial abnormality. Electronically Signed   By: Iven Finn M.D.   On: 12/02/2020 20:11   CT ABDOMEN PELVIS W CONTRAST  Result Date: 12/02/2020 CLINICAL DATA:  Acute nonlocalized abdominal pain. Recent stroke with right-sided weakness. EXAM: CT ABDOMEN AND PELVIS WITH CONTRAST TECHNIQUE: Multidetector CT imaging of the abdomen and pelvis was performed using the standard protocol following bolus  administration of intravenous contrast. CONTRAST:  58m OMNIPAQUE IOHEXOL 350 MG/ML SOLN COMPARISON:  None. FINDINGS: Lower chest: Linear atelectasis in the lung bases. Coronary artery calcifications. Hepatobiliary: The gallbladder is moderately distended without stone or wall thickening. No bile duct dilatation. No focal liver lesions. Pancreas: Unremarkable. No pancreatic ductal dilatation  or surrounding inflammatory changes. Spleen: Normal in size without focal abnormality. Adrenals/Urinary Tract: No adrenal gland nodules. Multiple stones demonstrated in the upper pole of the right kidney measuring in total, 1.3 cm. Nephrograms are symmetrical. No hydronephrosis or hydroureter. Bladder wall is diffusely thickened with heterogeneous increased density in the urine suggesting cystitis. Stomach/Bowel: Stomach, small bowel, and colon are not abnormally distended. Scattered stool within the colon. Sigmoid colonic diverticulosis with stranding around the sigmoid region at the junction of the descending and sigmoid. This likely represents early diverticulitis. However, the short segment of involvement suggest possible underlying neoplasm and follow-up after resolution of the acute process is recommended. No proximal obstruction. Appendix is normal. Vascular/Lymphatic: Aortic atherosclerosis. No enlarged abdominal or pelvic lymph nodes. Reproductive: Prostate gland is markedly enlarged. Other: No free air or free fluid in the abdomen. Foci of infiltration in the anterior abdominal wall likely represent injection sites. Musculoskeletal: Degenerative changes in the spine. Degenerative changes in the hips and symphysis pubis. No destructive bone lesions. IMPRESSION: 1. Atelectasis in the lung bases. 2. Nonobstructing stones in the upper pole right kidney. 3. Sigmoid colonic diverticulosis with focal stranding suggesting early diverticulitis. No abscess. Follow-up after resolution of acute process recommended to exclude  underlying neoplasm. 4. Aortic atherosclerosis. 5. Prostate gland is enlarged. 6. Bladder wall thickening and heterogeneous appearance of the urine likely representing cystitis. Electronically Signed   By: Lucienne Capers M.D.   On: 12/02/2020 21:11   DG Chest Portable 1 View  Result Date: 12/02/2020 CLINICAL DATA:  Shortness of breath EXAM: PORTABLE CHEST 1 VIEW COMPARISON:  Chest x-ray 10/06/2020 FINDINGS: The heart and mediastinal contours are unchanged. No focal consolidation. No pulmonary edema. No pleural effusion. No pneumothorax. No acute osseous abnormality. IMPRESSION: No active disease. Electronically Signed   By: Iven Finn M.D.   On: 12/02/2020 20:21     Assessment/Plan 65 year old male with history of Diabetes, hypertension, and GERD, r and who  was treated in the ED on 10/11 for early acute diverticulitis, who was called back to the ED on 10/12 due to an abnormal blood culture with staph species.      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, aphasia and dysphagia, discharged from rehab on 11/25/20 - Increase nursing assistance for transfers, ADLs and communication - Continue Lipitor 80 and aspirin 81 and Plavix(patient might be approaching 30-day overlap) - Continue Ritalin, and trazodone   Diabetes mellitus (HCC) - Sliding scale insulin for now    Essential hypertension - Continue amlodipine   BP - Continue tamsulosin  DVT prophylaxis: Lovenox  Code Status: full code  Family Communication:  none  Disposition Plan:  Back to previous home environment Consults called: none  Status:At the time of admission, it appears that the appropriate admission status for this patient is INPATIENT. This is judged to be reasonable and necessary in order to provide the required intensity of service to ensure the patient's safety given the presenting symptoms, physical exam findings, and initial radiographic and laboratory data in the context of their  Comorbid conditions.   Patient requires inpatient status due  to high intensity of service, high risk for further deterioration and high frequency of surveillance required.   I certify that at the point of admission it is my clinical judgment that the patient will require inpatient hospital care spanning beyond [place] MD [facility]     12/03/2020, 9:27 PM

## 2020-12-03 NOTE — Consult Note (Signed)
Pharmacy Antibiotic Note  Raymond Kerr is a 65 y.o. male admitted on 12/03/2020 with bacteremia. Patient evaluated in ED 10/11 for AMS and concerns for possible diverticulitis. 1 set of blood cultures drawn resulted in 2/2 staphylococcal species. Patient called back to hospital Patient. Pharmacy has been consulted for Vancomycin dosing.  Patient also receiving ceftriaxone + flagyl for diverticulitis   Plan: Vancomycin 2000 mg LD x 1 ordered  Initiate Vancomycin 750 mg Q12H. Goal AUC 400-550 Estimated AUC 412/Cmin: 11.7 Scr 1.01, IBW, Vd 0.5 Follow up culture results     Height: 6' (182.9 cm) Weight: 102.7 kg (226 lb 6.6 oz) IBW/kg (Calculated) : 77.6  Temp (24hrs), Avg:98.5 F (36.9 C), Min:98 F (36.7 C), Max:99 F (37.2 C)  Recent Labs  Lab 12/02/20 1819 12/02/20 1835 12/02/20 2002 12/03/20 2006  WBC 8.5  --   --  7.7  CREATININE 0.99  --   --  1.01  LATICACIDVEN  --  1.6 1.5 1.3    Estimated Creatinine Clearance: 90.3 mL/min (by C-G formula based on SCr of 1.01 mg/dL).    No Known Allergies  Antimicrobials this admission: 10/12 Vancomycin >>  10/12 Zosyn >> x 1  10/13 Ceftriaxone >> 10/13 Metronidazole >>  Dose adjustments this admission: N/a  Microbiology results: 10/11 BCx: 2/2 (single set) Staphylococcal species 10/12 Bcx: sent 10/12 UCx: sent  10/12 MRSA PCR: sent  Thank you for allowing pharmacy to be a part of this patient's care.  Sharen Hones, PharmD, BCPS Clinical Pharmacist   12/03/2020 9:19 PM

## 2020-12-03 NOTE — Progress Notes (Signed)
PHARMACIST - PHYSICIAN COMMUNICATION  CONCERNING:  Enoxaparin (Lovenox) for DVT Prophylaxis    RECOMMENDATION: Patient was prescribed enoxaprin 40mg  q24 hours for VTE prophylaxis.   Filed Weights   12/03/20 1955  Weight: 102.7 kg (226 lb 6.6 oz)    Body mass index is 30.71 kg/m.  Estimated Creatinine Clearance: 90.3 mL/min (by C-G formula based on SCr of 1.01 mg/dL).   Based on [facility] policy patient is candidate for enoxaparin 0.5mg /kg TBW SQ every 24 hours based on BMI being >30.   DESCRIPTION: Pharmacy has adjusted enoxaparin dose per [facility] policy.  Patient is now receiving enoxaparin 50 mg every 24 hours    [facility], PharmD,BCPS Clinical Pharmacist  12/03/2020 10:08 PM

## 2020-12-03 NOTE — ED Triage Notes (Addendum)
Pt arrived via [facility] from home with reports of abnormal labs, per EMS it was something with a culture that was positive.    Pt is non-verbal, hx of recent stroke in August.

## 2020-12-03 NOTE — ED Provider Notes (Signed)
[facility] Emergency Department Provider Note  ____________________________________________   Event Date/Time   First MD Initiated Contact with Patient 12/03/20 2019     (approximate)  I have reviewed the triage vital signs and the nursing notes.   HISTORY  Chief Complaint Abnormal Labs (+  blood culture)   HPI Raymond Kerr is a 65 y.o. male with past medical history of HTN, DM, GERD and recent CVA discharged on 10/4 with residual deficits including expressive aphasia and essentially bedbound due to weakness recently evaluated emergency room yesterday due to some altered mental status with concerns of possible diverticulitis called back to the emergency room after blood culture grew back staph positive cocci.  Patient is unable provide any history on arrival secondary to stroke and being nonverbal on arrival emergency room.  Per discussion with his daughter who is already on the phone since his stroke he has had a right hemiparesis and is usually unable to speak and is bedbound.  She thinks he may have had some abdominal comfort last couple days because he seemed to wince whenever he is eating.  He has not had any vomiting or fevers or other sick symptoms.  He did receive antibiotics yesterday and this morning for the diverticulitis he was diagnosed with yesterday.  No other history is Ame[place] available on patient presentation.         Past Medical History:  Diagnosis Date   Diabetes mellitus without complication ([place])    GERD (gastroesophageal reflux disease)    Hypertension    Stroke [facility])     Patient Active Problem List   Diagnosis Date Noted   Essential hypertension    Spastic hemiparesis ([place])    Uncontrolled type 2 diabetes mellitus with hyperglycemia ([place])    Left thalamic infarction ([place]) 10/24/2020   Hypotension 10/19/2020   Hypokalemia 10/17/2020   Hypomagnesemia 10/17/2020   Hypophosphatemia 10/17/2020   Pressure injury of skin  10/16/2020   Cerebrovascular accident (CVA) ([place])    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 ([place]) 10/07/2020   Paralysis of left third cranial nerve    Right sided weakness    TIA (transient ischemic attack) 10/06/2020   Acute ischemic left ACA stroke ([place]) 10/06/2020   Microhematuria 12/10/2016   Elevated PSA 12/10/2016   Diabetes mellitus ([place]) 11/28/2016   Benign essential hypertension 10/20/2015    Past Surgical History:  Procedure Laterality Date   TEE WITHOUT CARDIOVERSION N/A 10/08/2020   Procedure: TRANSESOPHAGEAL ECHOCARDIOGRAM (TEE);  Surgeon: Corey Skains, MD;  Location: [facility] ORS;  Service: Cardiovascular;  Laterality: N/A;    Prior to Admission medications   Medication Sig Start Date End Date Taking? Authorizing Provider  amLODipine (NORVASC) 10 MG tablet Take 1 tablet (10 mg total) by mouth daily. 11/24/20   Angiulli, Lavon Paganini, PA-C  aspirin 81 MG chewable tablet Chew 1 tablet (81 mg total) by mouth daily. 10/25/20   Florencia Reasons, MD  atorvastatin (LIPITOR) 80 MG tablet Take 1 tablet (80 mg total) by mouth daily. 11/24/20   Angiulli, Lavon Paganini, PA-C  B Complex Vitamins (VITAMIN B COMPLEX) TABS Take 1 tablet by mouth daily. 11/24/20   Angiulli, Lavon Paganini, PA-C  blood glucose meter kit and supplies Dispense based on patient and insurance preference. Use up to four times daily as directed. (FOR ICD-10 E10.9, E11.9). 11/24/20   Angiulli, Lavon Paganini, PA-C  Blood Glucose Monitoring Suppl (BLOOD GLUCOSE MONITOR SYSTEM) w/Device KIT Use as directed up to 4 times  daily. 11/24/20   Angiulli, Lavon Paganini, PA-C  cefdinir (OMNICEF) 300 MG capsule Take 1 capsule (300 mg total) by mouth 2 (two) times daily for 7 days. 12/02/20 12/09/20  Duffy Bruce, MD  Vitamin D, Ergocalciferol, (DRISDOL) 1.25 MG (50000 UNIT) CAPS capsule Take 1 capsule (50,000 Units total) by mouth once a week. 11/28/20   Angiulli, Lavon Paganini, PA-C  clopidogrel (PLAVIX) 75 MG tablet Take 1 tablet (75 mg total) by  mouth daily. 11/24/20   Angiulli, Lavon Paganini, PA-C  glimepiride (AMARYL) 2 MG tablet Take 1 tablet (2 mg total) by mouth daily with breakfast. 11/24/20   Angiulli, Lavon Paganini, PA-C  metFORMIN (GLUCOPHAGE) 500 MG tablet Take 1 tablet (500 mg total) by mouth 2 (two) times daily with a meal. 11/24/20   Angiulli, Lavon Paganini, PA-C  methylphenidate (RITALIN) 10 MG tablet Take 1 tablet (10 mg total) by mouth 2 (two) times daily with breakfast and lunch. 11/24/20   Angiulli, Lavon Paganini, PA-C  metroNIDAZOLE (FLAGYL) 500 MG tablet Take 1 tablet (500 mg total) by mouth 3 (three) times daily for 7 days. 12/02/20 12/09/20  Duffy Bruce, MD  polyethylene glycol (MIRALAX / GLYCOLAX) 17 g packet Take 17 g by mouth 2 (two) times daily. 11/24/20   Angiulli, Lavon Paganini, PA-C  tamsulosin (FLOMAX) 0.4 MG CAPS capsule Take 1 capsule (0.4 mg total) by mouth daily after supper. 11/24/20   Angiulli, Lavon Paganini, PA-C  traZODone (DESYREL) 50 MG tablet Take 0.5 tablets (25 mg total) by mouth at bedtime as needed for sleep. 11/24/20   Angiulli, Lavon Paganini, PA-C    Allergies Patient has no known allergies.  Family History  Problem Relation Age of Onset   Prostate cancer Neg Hx    Bladder Cancer Neg Hx    Kidney cancer Neg Hx     Social History Social History   Tobacco Use   Smoking status: Never   Smokeless tobacco: Never  Substance Use Topics   Alcohol use: Yes   Drug use: No    Review of Systems  Review of Systems  Unable to perform ROS: Patient nonverbal     ____________________________________________   PHYSICAL EXAM:  VITAL SIGNS: ED Triage Vitals  Enc Vitals Group     BP 12/03/20 1952 (!) 146/98     Pulse Rate 12/03/20 1952 (!) 104     Resp 12/03/20 1952 20     Temp 12/03/20 1952 99 F (37.2 C)     Temp Source 12/03/20 1952 Oral     SpO2 12/03/20 1952 100 %     Weight 12/03/20 1955 226 lb 6.6 oz (102.7 kg)     Height 12/03/20 1955 6' (1.829 m)     Head Circumference --      Peak Flow --      Pain Score  --      Pain Loc --      Pain Edu? --      Excl. in [place]? --    Vitals:   12/03/20 1952 12/03/20 2042  BP: (!) 146/98 (!) 146/86  Pulse: (!) 104   Resp: 20 16  Temp: 99 F (37.2 C) 98.4 F (36.9 C)  SpO2: 100% 99%   Physical Exam Vitals and nursing note reviewed.  Constitutional:      Appearance: He is well-developed. He is obese.  HENT:     Head: Normocephalic and atraumatic.     Right Ear: External ear normal.     Left Ear: External ear normal.  Nose: Nose normal.     Mouth/Throat:     Mouth: Mucous membranes are moist.  Eyes:     Conjunctiva/sclera: Conjunctivae normal.  Cardiovascular:     Rate and Rhythm: Regular rhythm. Tachycardia present.     Heart sounds: No murmur heard. Pulmonary:     Effort: Pulmonary effort is normal. No respiratory distress.     Breath sounds: Normal breath sounds.  Abdominal:     General: There is no distension.     Palpations: Abdomen is soft.  Musculoskeletal:     Cervical back: Neck supple.  Skin:    General: Skin is warm and dry.     Capillary Refill: Capillary refill takes less than 2 seconds.  Neurological:     Mental Status: He is alert. Mental status is at baseline.     Comments: Patient is unable to move his right hemibody on command.  He is able to move his fingers and toes on the left on command but is unable to follow any other commands with his left arm or left lower extremity.  Oropharynx is unremarkable.  Right pupil is unremarkable.  He is holding left eye shut but on exam left pupil seems relatively unremarkable.     ____________________________________________   LABS (all labs ordered are listed, but only abnormal results are displayed)  Labs Reviewed  CBC WITH DIFFERENTIAL/PLATELET - Abnormal; Notable for the following components:      Result Value   HCT 38.7 (*)    All other components within normal limits  COMPREHENSIVE METABOLIC PANEL - Abnormal; Notable for the following components:   CO2 21 (*)     Glucose, Bld 208 (*)    Albumin 3.2 (*)    AST 172 (*)    ALT 241 (*)    All other components within normal limits  CULTURE, BLOOD (ROUTINE X 2)  CULTURE, BLOOD (ROUTINE X 2)  RESP PANEL BY RT-PCR (FLU A&B, COVID) ARPGX2  URINE CULTURE  MRSA NEXT GEN BY PCR, NASAL  LACTIC ACID, PLASMA  PROCALCITONIN  PROCALCITONIN  LACTIC ACID, PLASMA  PROTIME-INR  APTT  URINALYSIS, COMPLETE (UACMP) WITH MICROSCOPIC   ____________________________________________  EKG  ____________________________________________  RADIOLOGY  ED MD interpretation: CT obtained during yesterday's ED visit showed atelectasis at the lung bases and a nonobstructing kidney stone at the upper pole of the right kidney as well as likely early/mild diverticulitis.  No abscess or perforation.  There is some prostatic enlargement and aortic atherosclerosis as well as bladder thickening but no other acute abdominopelvic process.  Official radiology report(s): No results found.  ____________________________________________   PROCEDURES  Procedure(s) performed (including Critical Care):  .1-3 Lead EKG Interpretation Performed by: Lucrezia Starch, MD Authorized by: Lucrezia Starch, MD     Interpretation: non-specific     ECG rate assessment: tachycardic     Rhythm: sinus rhythm     Ectopy: none     Conduction: normal     ____________________________________________   INITIAL IMPRESSION / ASSESSMENT AND PLAN / ED COURSE      Patient presents with above-stated history exam for assessment of positive blood cultures drawn yesterday during evaluation for some altered mental status.  Seen patient has been less verbal while he is typically able to answer some very basic questions and follow Commands.  He Was Sent Home with Outpatient Antibiotics with Concerns for Uncomplicated Diverticulitis Yesterday.  Today He Is Slight Tachycardic with otherwise stable vital signs on room air.  I was able to  speak with his  daughter who other than stating he seemed to maybe have little discomfort and was less verbal than usual was at his neurological baseline.  No subsequent events since being discharged last night.  Given he is tachycardic on arrival here and certainly possible he is developed sepsis and is bacteremic.  I was able to review blood culture obtained yesterday was positive for staph species.  While is possible this is a contaminant given he does have evidence on CT of likely diverticulitis and is tachycardic we will redraw blood cultures.  Broad-spectrum IV antibiotics.  In addition we will obtain routine screening labs as I do not think sepsis immediately ruled out the patient presentation patient yesterday's evaluation.   CT obtained during yesterday's ED visit showed atelectasis at the lung bases and a nonobstructing kidney stone at the upper pole of the right kidney as well as likely early/mild diverticulitis.  No abscess or perforation.  There is some prostatic enlargement and aortic atherosclerosis as well as bladder thickening but no other acute abdominopelvic process.  CBC today shows no leukocytosis or acute anemia.  CMP remarkable for glucose of 208 and stable LFTs without any other significant acute electrolyte or metabolic derangements.  Lactic acid is not elevated.  Given absence of fever or leukocytosis or hypotension do not believe patient is septic at this time.  We will repeat blood cultures and give broad-spectrum IV antibiotics and admit to hospital service for observation with concern for possible bacteremia in the setting of diverticulitis.   ____________________________________________   FINAL CLINICAL IMPRESSION(S) / ED DIAGNOSES  Final diagnoses:  Bacteremia  Diverticulitis    Medications  piperacillin-tazobactam (ZOSYN) IVPB 3.375 g (has no administration in time range)  lactated ringers bolus 1,000 mL (has no administration in time range)  vancomycin (VANCOREADY) IVPB 2000  mg/400 mL (has no administration in time range)  insulin aspart (novoLOG) injection 0-15 Units (has no administration in time range)     ED Discharge Orders     None        Note:  This document was prepared using Dragon voice recognition software and may include unintentional dictation errors.    Lucrezia Starch, MD 12/03/20 2107

## 2020-12-03 NOTE — Consult Note (Signed)
PHARMACY -  BRIEF ANTIBIOTIC NOTE   Pharmacy has received consult(s) for Vancomycin from an ED provider.  The patient's profile has been reviewed for ht/wt/allergies/indication/available labs.    One time order(s) placed for   Vancomycin 2000 mg   Further antibiotics/pharmacy consults should be ordered by admitting physician if indicated.                       Thank you, Sharen Hones, PharmD, BCPS Clinical Pharmacist   12/03/2020  9:18 PM

## 2020-12-04 DIAGNOSIS — N400 Enlarged prostate without lower urinary tract symptoms: Secondary | ICD-10-CM | POA: Insufficient documentation

## 2020-12-04 DIAGNOSIS — E1169 Type 2 diabetes mellitus with other specified complication: Secondary | ICD-10-CM | POA: Diagnosis not present

## 2020-12-04 DIAGNOSIS — B370 Candidal stomatitis: Secondary | ICD-10-CM | POA: Insufficient documentation

## 2020-12-04 DIAGNOSIS — E785 Hyperlipidemia, unspecified: Secondary | ICD-10-CM

## 2020-12-04 DIAGNOSIS — R7881 Bacteremia: Secondary | ICD-10-CM | POA: Diagnosis not present

## 2020-12-04 DIAGNOSIS — I10 Essential (primary) hypertension: Secondary | ICD-10-CM

## 2020-12-04 DIAGNOSIS — K5792 Diverticulitis of intestine, part unspecified, without perforation or abscess without bleeding: Principal | ICD-10-CM

## 2020-12-04 LAB — CBG MONITORING, ED
Glucose-Capillary: 121 mg/dL — ABNORMAL HIGH (ref 70–99)
Glucose-Capillary: 141 mg/dL — ABNORMAL HIGH (ref 70–99)
Glucose-Capillary: 142 mg/dL — ABNORMAL HIGH (ref 70–99)
Glucose-Capillary: 150 mg/dL — ABNORMAL HIGH (ref 70–99)
Glucose-Capillary: 152 mg/dL — ABNORMAL HIGH (ref 70–99)
Glucose-Capillary: 170 mg/dL — ABNORMAL HIGH (ref 70–99)

## 2020-12-04 LAB — GLUCOSE, CAPILLARY: Glucose-Capillary: 144 mg/dL — ABNORMAL HIGH (ref 70–99)

## 2020-12-04 LAB — PROCALCITONIN: Procalcitonin: <0.1 ng/mL

## 2020-12-04 MED ORDER — CHLORHEXIDINE GLUCONATE 0.12 % MT SOLN
15.0000 mL | Freq: Two times a day (BID) | OROMUCOSAL | Status: DC
  Administered 2020-12-04 – 2020-12-13 (×17): 15 mL via OROMUCOSAL
  Filled 2020-12-04 (×18): qty 15

## 2020-12-04 MED ORDER — ORAL CARE MOUTH RINSE
15.0000 mL | Freq: Two times a day (BID) | OROMUCOSAL | Status: DC
  Administered 2020-12-05 – 2020-12-13 (×16): 15 mL via OROMUCOSAL

## 2020-12-04 MED ORDER — INSULIN GLARGINE-YFGN 100 UNIT/ML ~~LOC~~ SOLN
8.0000 [IU] | Freq: Every day | SUBCUTANEOUS | Status: DC
  Administered 2020-12-04 – 2020-12-10 (×7): 8 [IU] via SUBCUTANEOUS
  Filled 2020-12-04 (×8): qty 0.08

## 2020-12-04 MED ORDER — FLUCONAZOLE 100MG IVPB
100.0000 mg | INTRAVENOUS | Status: DC
  Administered 2020-12-05 – 2020-12-06 (×2): 100 mg via INTRAVENOUS
  Filled 2020-12-04 (×5): qty 50

## 2020-12-04 NOTE — Progress Notes (Signed)
Patient ID: Raymond Kerr, male   DOB: 1955-05-12, 65 y.o.   MRN: [id number] [facility] PROGRESS NOTE  Raymond Kerr WEX:[id number] DOB: 1955-11-19 DOA: 12/03/2020 PCP: Barbette Reichmann, MD  HPI/Subjective: Patient has aphasia and trouble speaking.  History of stroke.  Called back by ER team that he had positive blood cultures for treatment.  Staph species growing out of the blood cultures from 12/02/2020.  Being treated for diverticulitis.  Daughter noticed that he is uncomfortable after eating and not eating as much.  Objective: Vitals:   12/04/20 0736 12/04/20 1042  BP: (!) 147/87 136/77  Pulse: 97 93  Resp: 13 17  Temp: 98.8 F (37.1 C)   SpO2: 96% 97%    Intake/Output Summary (Last 24 hours) at 12/04/2020 1211 Last data filed at 12/04/2020 1044 Gross per 24 hour  Intake 300 ml  Output 350 ml  Net -50 ml   Filed Weights   12/03/20 1955  Weight: 102.7 kg    ROS: Review of Systems  Unable to perform ROS: Acuity of condition  Exam: Physical Exam HENT:     Head: Normocephalic.     Mouth/Throat:     Comments: Thrush on the tongue. Eyes:     Conjunctiva/sclera: Conjunctivae normal.     Comments: Left eyelid droopy  Cardiovascular:     Rate and Rhythm: Normal rate and regular rhythm.     Heart sounds: Normal heart sounds, S1 normal and S2 normal.  Pulmonary:     Breath sounds: Examination of the right-lower field reveals decreased breath sounds. Examination of the left-lower field reveals decreased breath sounds. Decreased breath sounds present. No wheezing, rhonchi or rales.  Abdominal:     Palpations: Abdomen is soft.     Tenderness: There is generalized abdominal tenderness.  Musculoskeletal:     Right ankle: No swelling.     Left ankle: No swelling.  Skin:    General: Skin is warm.     Findings: No rash.     Comments: Chronic lower extremity skin discoloration bilateral shins  Neurological:     Mental Status: He is alert.     Comments: Left eyelid  droop.  Unable to straight leg raise either leg today.      Scheduled Meds:  amLODipine  10 mg Oral Daily   aspirin  81 mg Oral Daily   atorvastatin  80 mg Oral Daily   B-complex with vitamin C  1 tablet Oral Daily   clopidogrel  75 mg Oral Daily   enoxaparin (LOVENOX) injection  50 mg Subcutaneous QHS   insulin aspart  0-15 Units Subcutaneous Q4H   insulin aspart  0-15 Units Subcutaneous TID WC   insulin aspart  0-5 Units Subcutaneous QHS   methylphenidate  10 mg Oral BID WC   metroNIDAZOLE  500 mg Oral Q12H   tamsulosin  0.4 mg Oral QPC supper   Continuous Infusions:  cefTRIAXone (ROCEPHIN)  IV Stopped (12/04/20 0700)   fluconazole (DIFLUCAN) IV     vancomycin Stopped (12/04/20 1018)    Assessment/Plan:  Bacteremia versus skin contamination with Staphylococcus species growing out of blood cultures on 12/02/2020.  We will get infectious disease consultation.  Repeat blood cultures drawn yesterday.  On vancomycin. Diverticulitis seen on CT scan of the abdomen pelvis.  Patient on Rocephin and Flagyl. Thrush.  I started IV Diflucan. Completed nonhemorrhagic CVA with right-sided hemiparesis and aphasia.  Patient was on aspirin Plavix and Lipitor Type 2 diabetes mellitus with hyperlipidemia.  On Lipitor.  At  home on metformin and glimepiride.  We will start low-dose Semglee insulin at night. Essential hypertension on Norvasc BPH on Flomax We will get PT and OT evaluations.  As per daughter able to sit up but not able to walk.        Code Status:     Code Status Orders  (From admission, onward)           Start     Ordered   12/03/20 2139  Full code  Continuous        12/03/20 2141           Code Status History     Date Active Date Inactive Code Status Order ID Comments User Context   10/24/2020 1646 11/26/2020 0141 Full Code [id number]  Lynnae Prude Inpatient   10/24/2020 1646 10/24/2020 1646 Full Code [id number]  Lynnae Prude Inpatient    10/06/2020 0233 10/24/2020 1644 Full Code [id number]  Mansy, Vernetta Honey, MD ED      Family Communication: Spoke with daughter on the phone Disposition Plan: Status is: Inpatient  Dispo: The patient is from: Home              Anticipated d/c is to: Home with home health              Patient currently called back by the OR secondary to blood culture showing staph species.   Difficult to place patient.  No.  Consultants: Infectious disease  Antibiotics: Vancomycin, Rocephin and Flagyl Diflucan  Time spent: 28 minutes, case discussed with ID  Alford Highland  [facility]

## 2020-12-04 NOTE — Consult Note (Signed)
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 Kerr with recent CVA, largely bedbound now with hemiparesis and aphasia. He was seen in ED recently for increasing confusion at home.  He had work up in ED with CT showing possible early diverticulitis.  Dced on oral cefdinir and flagyl. However was called back to ED after one BCX + gram positive cocci- staph species.  He has wbc 7, no fever. No skin or soft tissue infection. Started on vanco and zosyn.  Repeat bcx done.  LFTs have been elevated.   Past Medical History:  Diagnosis Date   Diabetes mellitus without complication (HCC)    GERD (gastroesophageal reflux disease)    Hypertension    Stroke [facility])    Past Surgical History:  Procedure Laterality Date   TEE WITHOUT CARDIOVERSION N/A 10/08/2020   Procedure: TRANSESOPHAGEAL ECHOCARDIOGRAM (TEE);  Surgeon: Corey Skains, MD;  Location: [facility] ORS;  Service: Cardiovascular;  Laterality: N/A;   Social History   Tobacco Use   Smoking status: Never   Smokeless tobacco: Never  Substance Use Topics   Alcohol use: Yes   Drug use: No   Family History  Problem Relation Age of Onset   Prostate cancer Neg Hx    Bladder Cancer Neg Hx    Kidney cancer Neg Hx     Allergies: No Known Allergies  Current antibiotics: Antibiotics Given (last 72 hours)     Date/Time Action Medication Dose Rate   12/03/20 2111 New Bag/Given   piperacillin-tazobactam (ZOSYN) IVPB 3.375 g 3.375 g 100 mL/hr   12/03/20 2152 New Bag/Given   vancomycin (VANCOREADY) IVPB 2000 mg/400 mL 2,000  mg 200 mL/hr   12/04/20 0602 New Bag/Given   cefTRIAXone (ROCEPHIN) 2 g in sodium chloride 0.9 % 100 mL IVPB 2 g 200 mL/hr   12/04/20 0918 New Bag/Given   vancomycin (VANCOREADY) IVPB 750 mg/150 mL 750 mg 150 mL/hr   12/04/20 0924 Given   metroNIDAZOLE (FLAGYL) tablet 500 mg 500 mg        MEDICATIONS:  amLODipine  10 mg Oral Daily   aspirin  81 mg Oral Daily   atorvastatin  80 mg Oral Daily   B-complex with vitamin C  1 tablet Oral Daily   clopidogrel  75 mg Oral Daily   enoxaparin (LOVENOX) injection  50 mg Subcutaneous QHS   insulin aspart  0-15 Units Subcutaneous TID WC   insulin aspart  0-5 Units Subcutaneous QHS   insulin glargine-yfgn  8 Units Subcutaneous QHS   methylphenidate  10 mg Oral BID WC   metroNIDAZOLE  500 mg Oral Q12H   tamsulosin  0.4 mg Oral QPC supper    Review of Systems - unable to obtain  OBJECTIVE: Temp:  [98.2 F (36.8 C)-99.2 F (37.3 C)] 99.2 F (37.3 C) (10/13 1430) Pulse Rate:  [93-107] 98 (10/13 1430) Resp:  [13-20] 16 (10/13 1430) BP: (126-160)/(71-98) 140/77 (10/13 1430) SpO2:  [95 %-100 %] 96 % (10/13 1430) Weight:  [102.7 kg] 102.7 kg (10/12 1955) Physical Exam  Constitutional: aphasic, tries to verbalize HENT:  anicteric Mouth/Throat: mild facial droop Cardiovascular: Normal rate, regular rhythm and normal heart sounds. Exam reveals no gallop and no friction rub.  No murmur heard.  Pulmonary/Chest: Effort normal and breath sounds normal. No respiratory distress. He has no wheezes.  Abdominal: Soft. Bowel sounds are normal. He exhibits no distension. There is no tenderness.  Lymphadenopathy:  He has no cervical adenopathy.  Neurological: aphasic, L hemiparesisi Skin: Skin is warm and dry. No rash noted. No erythema.  Psychiatric: aphasix, unable to assess  LABS: Results for orders placed or performed during the hospital encounter of 12/03/20 (from the past 48 hour(s))  CBC with Differential     Status: Abnormal   Collection  Time: 12/03/20  8:06 PM  Result Value Ref Range   WBC 7.7 4.0 - 10.5 K/uL   RBC 4.44 4.22 - 5.81 MIL/uL   Hemoglobin 13.2 13.0 - 17.0 g/dL   HCT 38.7 (L) 39.0 - 52.0 %   MCV 87.2 80.0 - 100.0 fL   MCH 29.7 26.0 - 34.0 pg   MCHC 34.1 30.0 - 36.0 g/dL   RDW 14.8 11.5 - 15.5 %   Platelets 365 150 - 400 K/uL   nRBC 0.0 0.0 - 0.2 %   Neutrophils Relative % 70 %   Neutro Abs 5.4 1.7 - 7.7 K/uL   Lymphocytes Relative 23 %   Lymphs Abs 1.7 0.7 - 4.0 K/uL   Monocytes Relative 6 %   Monocytes Absolute 0.4 0.1 - 1.0 K/uL   Eosinophils Relative 1 %   Eosinophils Absolute 0.1 0.0 - 0.5 K/uL   Basophils Relative 0 %   Basophils Absolute 0.0 0.0 - 0.1 K/uL   Immature Granulocytes 0 %   Abs Immature Granulocytes 0.02 0.00 - 0.07 K/uL    Comment: Performed at [facility], [address] [place], [place] [address]  Comprehensive metabolic panel     Status: Abnormal   Collection Time: 12/03/20  8:06 PM  Result Value Ref Range   Sodium 135 135 - 145 mmol/L   Potassium 3.5 3.5 - 5.1 mmol/L   Chloride 104 98 - 111 mmol/L   CO2 21 (L) 22 - 32 mmol/L   Glucose, Bld 208 (H) 70 - 99 mg/dL    Comment: Glucose reference range applies only to samples taken after fasting for at least 8 hours.   BUN 14 8 - 23 mg/dL   Creatinine, Ser 1.01 0.61 - 1.24 mg/dL   Calcium 9.4 8.9 - 10.3 mg/dL   Total Protein 7.5 6.5 - 8.1 g/dL   Albumin 3.2 (L) 3.5 - 5.0 g/dL   AST 172 (H) 15 - 41 U/L   ALT 241 (H) 0 - 44 U/L   Alkaline Phosphatase 70 38 - 126 U/L   Total Bilirubin 0.8 0.3 - 1.2 mg/dL   GFR, Estimated >60 >60 mL/min    Comment: (NOTE) Calculated using the CKD-EPI Creatinine Equation (2021)    Anion gap 10 5 - 15    Comment: Performed at [facility], [address] [place], [place] [address]  Blood culture (routine x 2)     Status: None (Preliminary result)   Collection Time: 12/03/20  8:06 PM   Specimen: BLOOD RIGHT FOREARM  Result Value Ref Range   Specimen Description BLOOD  RIGHT FOREARM    Special Requests      BOTTLES DRAWN AEROBIC AND ANAEROBIC Blood Culture results may not be optimal due to an excessive volume of blood received in culture bottles  Culture      NO GROWTH < 12 HOURS Performed at [facility], [address] [place], [place] [address]    Report Status PENDING   Procalcitonin - Baseline     Status: None   Collection Time: 12/03/20  8:06 PM  Result Value Ref Range   Procalcitonin <0.10 ng/mL    Comment:        Interpretation: PCT (Procalcitonin) <= 0.5 ng/mL: Systemic infection (sepsis) is not likely. Local bacterial infection is possible. (NOTE)       Sepsis PCT Algorithm           Lower Respiratory Tract                                      Infection PCT Algorithm    ----------------------------     ----------------------------         PCT < 0.25 ng/mL                PCT < 0.10 ng/mL          Strongly encourage             Strongly discourage   discontinuation of antibiotics    initiation of antibiotics    ----------------------------     -----------------------------       PCT 0.25 - 0.50 ng/mL            PCT 0.10 - 0.25 ng/mL               OR       >80% decrease in PCT            Discourage initiation of                                            antibiotics      Encourage discontinuation           of antibiotics    ----------------------------     -----------------------------         PCT >= 0.50 ng/mL              PCT 0.26 - 0.50 ng/mL               AND        <80% decrease in PCT             Encourage initiation of                                             antibiotics       Encourage continuation           of antibiotics    ----------------------------     -----------------------------        PCT >= 0.50 ng/mL                  PCT > 0.50 ng/mL               AND         increase in PCT                  Strongly encourage  initiation of antibiotics    Strongly encourage  escalation           of antibiotics                                     -----------------------------                                           PCT <= 0.25 ng/mL                                                 OR                                        > 80% decrease in PCT                                      Discontinue / Do not initiate                                             antibiotics  Performed at [facility], [address] [place], [place] [address]   Lactic acid, plasma     Status: None   Collection Time: 12/03/20  8:06 PM  Result Value Ref Range   Lactic Acid, Venous 1.3 0.5 - 1.9 mmol/L    Comment: Performed at [facility], [address], [place], [place] [address]  Resp Panel by RT-PCR (Flu A&B, Covid) Nasopharyngeal Swab     Status: None   Collection Time: 12/03/20  8:45 PM   Specimen: Nasopharyngeal Swab; Nasopharyngeal(NP) swabs in vial transport medium  Result Value Ref Range   SARS Coronavirus 2 by RT PCR NEGATIVE NEGATIVE    Comment: (NOTE) SARS-CoV-2 target nucleic acids are NOT DETECTED.  The SARS-CoV-2 RNA is generally detectable in upper respiratory specimens during the acute phase of infection. The lowest concentration of SARS-CoV-2 viral copies this assay can detect is 138 copies/mL. A negative result does not preclude SARS-Cov-2 infection and should not be used as the sole basis for treatment or other patient management decisions. A negative result may occur with  improper specimen collection/handling, submission of specimen other than nasopharyngeal swab, presence of viral mutation(s) within the areas targeted by this assay, and inadequate number of viral copies(<138 copies/mL). A negative result must be combined with clinical observations, patient history, and epidemiological information. The expected result is Negative.  Fact Sheet for Patients:  EntrepreneurPulse.com.au  Fact Sheet for Healthcare  Providers:  IncredibleEmployment.be  This test is no t yet approved or cleared by the [place] FDA and  has been authorized for detection and/or diagnosis of SARS-CoV-2 by FDA under an Emergency Use Authorization (EUA). This EUA will remain  in effect (meaning this test can be used) for the duration of the COVID-19 declaration under Section 564(b)(1) of the Act, 21 U.S.C.section 360bbb-3(b)(1), unless the  authorization is terminated  or revoked sooner.       Influenza A by PCR NEGATIVE NEGATIVE   Influenza B by PCR NEGATIVE NEGATIVE    Comment: (NOTE) The Xpert Xpress SARS-CoV-2/FLU/RSV plus assay is intended as an aid in the diagnosis of influenza from Nasopharyngeal swab specimens and should not be used as a sole basis for treatment. Nasal washings and aspirates are unacceptable for Xpert Xpress SARS-CoV-2/FLU/RSV testing.  Fact Sheet for Patients: EntrepreneurPulse.com.au  Fact Sheet for Healthcare Providers: IncredibleEmployment.be  This test is not yet approved or cleared by the [place] FDA and has been authorized for detection and/or diagnosis of SARS-CoV-2 by FDA under an Emergency Use Authorization (EUA). This EUA will remain in effect (meaning this test can be used) for the duration of the COVID-19 declaration under Section 564(b)(1) of the Act, 21 U.S.C. section 360bbb-3(b)(1), unless the authorization is terminated or revoked.  Performed at [facility], [address] [place], [place] [address]   Lactic acid, plasma     Status: None   Collection Time: 12/03/20  8:51 PM  Result Value Ref Range   Lactic Acid, Venous 1.4 0.5 - 1.9 mmol/L    Comment: Performed at [facility], [address] [place], [place] [address]  MRSA Next Gen by PCR, Nasal     Status: None   Collection Time: 12/03/20  8:51 PM   Specimen: Nasal Mucosa; Nasal Swab  Result Value Ref Range   MRSA by PCR  Next Gen NOT DETECTED NOT DETECTED    Comment: (NOTE) The GeneXpert MRSA Assay (FDA approved for NASAL specimens only), is one component of a comprehensive MRSA colonization surveillance program. It is not intended to diagnose MRSA infection nor to guide or monitor treatment for MRSA infections. Test performance is not FDA approved in patients less than 21 years old. Performed at [facility], [address] [place], [place] [address]   Blood culture (routine x 2)     Status: None (Preliminary result)   Collection Time: 12/03/20  9:00 PM   Specimen: BLOOD  Result Value Ref Range   Specimen Description BLOOD LEFT ASSIST CONTROL    Special Requests      BOTTLES DRAWN AEROBIC AND ANAEROBIC Blood Culture results may not be optimal due to an inadequate volume of blood received in culture bottles   Culture      NO GROWTH < 12 HOURS Performed at [facility], [address], [place], [place] [address]    Report Status PENDING   Protime-INR     Status: None   Collection Time: 12/03/20  9:55 PM  Result Value Ref Range   Prothrombin Time 14.5 11.4 - 15.2 seconds   INR 1.1 0.8 - 1.2    Comment: (NOTE) INR goal varies based on device and disease states. Performed at [facility], [address] [place], [place] [address]   APTT     Status: None   Collection Time: 12/03/20  9:55 PM  Result Value Ref Range   aPTT 30 24 - 36 seconds    Comment: Performed at [facility], [address] [place], [place] [address]  CBG monitoring, ED     Status: Abnormal   Collection Time: 12/04/20 12:19 AM  Result Value Ref Range   Glucose-Capillary 170 (H) 70 - 99 mg/dL    Comment: Glucose reference range applies only to samples taken after fasting for at least 8 hours.  CBG monitoring, ED  Status: Abnormal   Collection Time: 12/04/20  4:23 AM  Result Value Ref Range   Glucose-Capillary 152 (H) 70 - 99 mg/dL    Comment: Glucose reference range applies only  to samples taken after fasting for at least 8 hours.  Procalcitonin     Status: None   Collection Time: 12/04/20  6:35 AM  Result Value Ref Range   Procalcitonin <0.10 ng/mL    Comment:        Interpretation: PCT (Procalcitonin) <= 0.5 ng/mL: Systemic infection (sepsis) is not likely. Local bacterial infection is possible. (NOTE)       Sepsis PCT Algorithm           Lower Respiratory Tract                                      Infection PCT Algorithm    ----------------------------     ----------------------------         PCT < 0.25 ng/mL                PCT < 0.10 ng/mL          Strongly encourage             Strongly discourage   discontinuation of antibiotics    initiation of antibiotics    ----------------------------     -----------------------------       PCT 0.25 - 0.50 ng/mL            PCT 0.10 - 0.25 ng/mL               OR       >80% decrease in PCT            Discourage initiation of                                            antibiotics      Encourage discontinuation           of antibiotics    ----------------------------     -----------------------------         PCT >= 0.50 ng/mL              PCT 0.26 - 0.50 ng/mL               AND        <80% decrease in PCT             Encourage initiation of                                             antibiotics       Encourage continuation           of antibiotics    ----------------------------     -----------------------------        PCT >= 0.50 ng/mL                  PCT > 0.50 ng/mL               AND         increase in PCT  Strongly encourage                                      initiation of antibiotics    Strongly encourage escalation           of antibiotics                                     -----------------------------                                           PCT <= 0.25 ng/mL                                                 OR                                        > 80% decrease in PCT                                       Discontinue / Do not initiate                                             antibiotics  Performed at [facility], [address] [place], [place] [address]   CBG monitoring, ED     Status: Abnormal   Collection Time: 12/04/20  7:39 AM  Result Value Ref Range   Glucose-Capillary 141 (H) 70 - 99 mg/dL    Comment: Glucose reference range applies only to samples taken after fasting for at least 8 hours.  CBG monitoring, ED     Status: Abnormal   Collection Time: 12/04/20 12:36 PM  Result Value Ref Range   Glucose-Capillary 150 (H) 70 - 99 mg/dL    Comment: Glucose reference range applies only to samples taken after fasting for at least 8 hours.   Comment 1 Notify RN   CBG monitoring, ED     Status: Abnormal   Collection Time: 12/04/20  2:38 PM  Result Value Ref Range   Glucose-Capillary 142 (H) 70 - 99 mg/dL    Comment: Glucose reference range applies only to samples taken after fasting for at least 8 hours.   Comment 1 Notify RN    No components found for: ESR, C REACTIVE PROTEIN MICRO: Recent Results (from the past 720 hour(s))  Urine Culture     Status: None   Collection Time: 11/13/20  5:35 AM   Specimen: Urine, Clean Catch  Result Value Ref Range Status   Specimen Description URINE, CLEAN CATCH  Final   Special Requests NONE  Final   Culture   Final    NO GROWTH Performed at [facility], [address], [place], [place] [address]    Report Status 11/14/2020 FINAL  Final  Blood culture (single)     Status: None (Preliminary result)   Collection Time: 12/02/20  6:35 PM   Specimen: BLOOD  Result Value Ref Range Status   Specimen Description   Final    BLOOD BLOOD LEFT FOREARM Performed at [facility], [address] [place], [place] [address]    Special Requests   Final    BOTTLES DRAWN AEROBIC AND ANAEROBIC Blood Culture adequate volume Performed at [facility], [address] [place], [place]  [address]    Culture  Setup Time   Final    GRAM POSITIVE COCCI Organism ID to follow IN BOTH AEROBIC AND ANAEROBIC BOTTLES CRITICAL RESULT CALLED TO, READ BACK BY AND VERIFIED WITH: JANE RYAN @1758  12/03/20 MJU    Culture   Final    GRAM POSITIVE COCCI CULTURE REINCUBATED FOR BETTER GROWTH Performed at [facility], [address], [place], [place] [address]    Report Status PENDING  Incomplete  Blood Culture ID Panel (Reflexed)     Status: Abnormal   Collection Time: 12/02/20  6:35 PM  Result Value Ref Range Status   Enterococcus faecalis NOT DETECTED NOT DETECTED Final   Enterococcus Faecium NOT DETECTED NOT DETECTED Final   Listeria monocytogenes NOT DETECTED NOT DETECTED Final   Staphylococcus species DETECTED (A) NOT DETECTED Final    Comment: CRITICAL RESULT CALLED TO, READ BACK BY AND VERIFIED WITH: JANE RYAN @1758  12/03/20 MJU    Staphylococcus aureus (BCID) NOT DETECTED NOT DETECTED Final   Staphylococcus epidermidis NOT DETECTED NOT DETECTED Final   Staphylococcus lugdunensis NOT DETECTED NOT DETECTED Final   Streptococcus species NOT DETECTED NOT DETECTED Final   Streptococcus agalactiae NOT DETECTED NOT DETECTED Final   Streptococcus pneumoniae NOT DETECTED NOT DETECTED Final   Streptococcus pyogenes NOT DETECTED NOT DETECTED Final   A.calcoaceticus-baumannii NOT DETECTED NOT DETECTED Final   Bacteroides fragilis NOT DETECTED NOT DETECTED Final   Enterobacterales NOT DETECTED NOT DETECTED Final   Enterobacter cloacae complex NOT DETECTED NOT DETECTED Final   Escherichia coli NOT DETECTED NOT DETECTED Final   Klebsiella aerogenes NOT DETECTED NOT DETECTED Final   Klebsiella oxytoca NOT DETECTED NOT DETECTED Final   Klebsiella pneumoniae NOT DETECTED NOT DETECTED Final   Proteus species NOT DETECTED NOT DETECTED Final   Salmonella species NOT DETECTED NOT DETECTED Final   Serratia marcescens NOT DETECTED NOT DETECTED Final   Haemophilus influenzae NOT DETECTED  NOT DETECTED Final   Neisseria meningitidis NOT DETECTED NOT DETECTED Final   Pseudomonas aeruginosa NOT DETECTED NOT DETECTED Final   Stenotrophomonas maltophilia NOT DETECTED NOT DETECTED Final   Candida albicans NOT DETECTED NOT DETECTED Final   Candida auris NOT DETECTED NOT DETECTED Final   Candida glabrata NOT DETECTED NOT DETECTED Final   Candida krusei NOT DETECTED NOT DETECTED Final   Candida parapsilosis NOT DETECTED NOT DETECTED Final   Candida tropicalis NOT DETECTED NOT DETECTED Final   Cryptococcus neoformans/gattii NOT DETECTED NOT DETECTED Final    Comment: Performed at [facility], [address] [place], [place] [address]  Resp Panel by RT-PCR (Flu A&B, Covid) Nasopharyngeal Swab     Status: None   Collection Time: 12/02/20  9:26 PM   Specimen: Nasopharyngeal Swab; Nasopharyngeal(NP) swabs in vial transport medium  Result Value Ref Range Status   SARS Coronavirus 2 by RT PCR NEGATIVE NEGATIVE Final    Comment: (NOTE) SARS-CoV-2 target nucleic acids are NOT DETECTED.  The SARS-CoV-2 RNA is generally detectable in  upper respiratory specimens during the acute phase of infection. The lowest concentration of SARS-CoV-2 viral copies this assay can detect is 138 copies/mL. A negative result does not preclude SARS-Cov-2 infection and should not be used as the sole basis for treatment or other patient management decisions. A negative result may occur with  improper specimen collection/handling, submission of specimen other than nasopharyngeal swab, presence of viral mutation(s) within the areas targeted by this assay, and inadequate number of viral copies(<138 copies/mL). A negative result must be combined with clinical observations, patient history, and epidemiological information. The expected result is Negative.  Fact Sheet for Patients:  EntrepreneurPulse.com.au  Fact Sheet for Healthcare Providers:   IncredibleEmployment.be  This test is no t yet approved or cleared by the [place] FDA and  has been authorized for detection and/or diagnosis of SARS-CoV-2 by FDA under an Emergency Use Authorization (EUA). This EUA will remain  in effect (meaning this test can be used) for the duration of the COVID-19 declaration under Section 564(b)(1) of the Act, 21 U.S.C.section 360bbb-3(b)(1), unless the authorization is terminated  or revoked sooner.       Influenza A by PCR NEGATIVE NEGATIVE Final   Influenza B by PCR NEGATIVE NEGATIVE Final    Comment: (NOTE) The Xpert Xpress SARS-CoV-2/FLU/RSV plus assay is intended as an aid in the diagnosis of influenza from Nasopharyngeal swab specimens and should not be used as a sole basis for treatment. Nasal washings and aspirates are unacceptable for Xpert Xpress SARS-CoV-2/FLU/RSV testing.  Fact Sheet for Patients: EntrepreneurPulse.com.au  Fact Sheet for Healthcare Providers: IncredibleEmployment.be  This test is not yet approved or cleared by the [place] FDA and has been authorized for detection and/or diagnosis of SARS-CoV-2 by FDA under an Emergency Use Authorization (EUA). This EUA will remain in effect (meaning this test can be used) for the duration of the COVID-19 declaration under Section 564(b)(1) of the Act, 21 U.S.C. section 360bbb-3(b)(1), unless the authorization is terminated or revoked.  Performed at [facility], [address] [place], [place] [address]   Blood culture (routine x 2)     Status: None (Preliminary result)   Collection Time: 12/03/20  8:06 PM   Specimen: BLOOD RIGHT FOREARM  Result Value Ref Range Status   Specimen Description BLOOD RIGHT FOREARM  Final   Special Requests   Final    BOTTLES DRAWN AEROBIC AND ANAEROBIC Blood Culture results may not be optimal due to an excessive volume of blood received in culture bottles    Culture   Final    NO GROWTH < 12 HOURS Performed at [facility], [address], [place], [place] [address]    Report Status PENDING  Incomplete  Resp Panel by RT-PCR (Flu A&B, Covid) Nasopharyngeal Swab     Status: None   Collection Time: 12/03/20  8:45 PM   Specimen: Nasopharyngeal Swab; Nasopharyngeal(NP) swabs in vial transport medium  Result Value Ref Range Status   SARS Coronavirus 2 by RT PCR NEGATIVE NEGATIVE Final    Comment: (NOTE) SARS-CoV-2 target nucleic acids are NOT DETECTED.  The SARS-CoV-2 RNA is generally detectable in upper respiratory specimens during the acute phase of infection. The lowest concentration of SARS-CoV-2 viral copies this assay can detect is 138 copies/mL. A negative result does not preclude SARS-Cov-2 infection and should not be used as the sole basis for treatment or other patient management decisions. A negative result may occur with  improper specimen collection/handling, submission of specimen other than nasopharyngeal swab,  presence of viral mutation(s) within the areas targeted by this assay, and inadequate number of viral copies(<138 copies/mL). A negative result must be combined with clinical observations, patient history, and epidemiological information. The expected result is Negative.  Fact Sheet for Patients:  EntrepreneurPulse.com.au  Fact Sheet for Healthcare Providers:  IncredibleEmployment.be  This test is no t yet approved or cleared by the [place] FDA and  has been authorized for detection and/or diagnosis of SARS-CoV-2 by FDA under an Emergency Use Authorization (EUA). This EUA will remain  in effect (meaning this test can be used) for the duration of the COVID-19 declaration under Section 564(b)(1) of the Act, 21 U.S.C.section 360bbb-3(b)(1), unless the authorization is terminated  or revoked sooner.       Influenza A by PCR NEGATIVE NEGATIVE Final   Influenza B by  PCR NEGATIVE NEGATIVE Final    Comment: (NOTE) The Xpert Xpress SARS-CoV-2/FLU/RSV plus assay is intended as an aid in the diagnosis of influenza from Nasopharyngeal swab specimens and should not be used as a sole basis for treatment. Nasal washings and aspirates are unacceptable for Xpert Xpress SARS-CoV-2/FLU/RSV testing.  Fact Sheet for Patients: EntrepreneurPulse.com.au  Fact Sheet for Healthcare Providers: IncredibleEmployment.be  This test is not yet approved or cleared by the [place] FDA and has been authorized for detection and/or diagnosis of SARS-CoV-2 by FDA under an Emergency Use Authorization (EUA). This EUA will remain in effect (meaning this test can be used) for the duration of the COVID-19 declaration under Section 564(b)(1) of the Act, 21 U.S.C. section 360bbb-3(b)(1), unless the authorization is terminated or revoked.  Performed at [facility], [address] [place], [place] [address]   MRSA Next Gen by PCR, Nasal     Status: None   Collection Time: 12/03/20  8:51 PM   Specimen: Nasal Mucosa; Nasal Swab  Result Value Ref Range Status   MRSA by PCR Next Gen NOT DETECTED NOT DETECTED Final    Comment: (NOTE) The GeneXpert MRSA Assay (FDA approved for NASAL specimens only), is one component of a comprehensive MRSA colonization surveillance program. It is not intended to diagnose MRSA infection nor to guide or monitor treatment for MRSA infections. Test performance is not FDA approved in patients less than 69 years old. Performed at [facility], [address] [place], [place] [address]   Blood culture (routine x 2)     Status: None (Preliminary result)   Collection Time: 12/03/20  9:00 PM   Specimen: BLOOD  Result Value Ref Range Status   Specimen Description BLOOD LEFT ASSIST CONTROL  Final   Special Requests   Final    BOTTLES DRAWN AEROBIC AND ANAEROBIC Blood Culture results may not be  optimal due to an inadequate volume of blood received in culture bottles   Culture   Final    NO GROWTH < 12 HOURS Performed at [facility], [address], [place],  [address]    Report Status PENDING  Incomplete    IMAGING: CT HEAD WO CONTRAST (5MM)  Result Date: 12/02/2020 CLINICAL DATA:  Mental status change.  Unknown cause. EXAM: CT HEAD WITHOUT CONTRAST TECHNIQUE: Contiguous axial images were obtained from the base of the skull through the vertex without intravenous contrast. COMPARISON:  CT head 11/03/2020 BRAIN: BRAIN Patchy and confluent areas of decreased attenuation are noted throughout the deep and periventricular white matter of the cerebral hemispheres bilaterally, compatible with chronic microvascular ischemic disease. Chronic left brainstem, thalamus, cerebral infarctions. No evidence  of large-territorial acute infarction. No parenchymal hemorrhage. No mass lesion. No extra-axial collection. No mass effect or midline shift. No hydrocephalus. Basilar cisterns are patent. Vascular: No hyperdense vessel. Atherosclerotic calcifications are present within the cavernous internal carotid arteries. Skull: No acute fracture or focal lesion. Sinuses/Orbits: Paranasal sinuses and mastoid air cells are clear. The orbits are unremarkable. Other: None. IMPRESSION: No acute intracranial abnormality. Electronically Signed   By: Iven Finn M.D.   On: 12/02/2020 20:11   CT ABDOMEN PELVIS W CONTRAST  Result Date: 12/02/2020 CLINICAL DATA:  Acute nonlocalized abdominal pain. Recent stroke with right-sided weakness. EXAM: CT ABDOMEN AND PELVIS WITH CONTRAST TECHNIQUE: Multidetector CT imaging of the abdomen and pelvis was performed using the standard protocol following bolus administration of intravenous contrast. CONTRAST:  47m OMNIPAQUE IOHEXOL 350 MG/ML SOLN COMPARISON:  None. FINDINGS: Lower chest: Linear atelectasis in the lung bases. Coronary artery calcifications.  Hepatobiliary: The gallbladder is moderately distended without stone or wall thickening. No bile duct dilatation. No focal liver lesions. Pancreas: Unremarkable. No pancreatic ductal dilatation or surrounding inflammatory changes. Spleen: Normal in size without focal abnormality. Adrenals/Urinary Tract: No adrenal gland nodules. Multiple stones demonstrated in the upper pole of the right kidney measuring in total, 1.3 cm. Nephrograms are symmetrical. No hydronephrosis or hydroureter. Bladder wall is diffusely thickened with heterogeneous increased density in the urine suggesting cystitis. Stomach/Bowel: Stomach, small bowel, and colon are not abnormally distended. Scattered stool within the colon. Sigmoid colonic diverticulosis with stranding around the sigmoid region at the junction of the descending and sigmoid. This likely represents early diverticulitis. However, the short segment of involvement suggest possible underlying neoplasm and follow-up after resolution of the acute process is recommended. No proximal obstruction. Appendix is normal. Vascular/Lymphatic: Aortic atherosclerosis. No enlarged abdominal or pelvic lymph nodes. Reproductive: Prostate gland is markedly enlarged. Other: No free air or free fluid in the abdomen. Foci of infiltration in the anterior abdominal wall likely represent injection sites. Musculoskeletal: Degenerative changes in the spine. Degenerative changes in the hips and symphysis pubis. No destructive bone lesions. IMPRESSION: 1. Atelectasis in the lung bases. 2. Nonobstructing stones in the upper pole right kidney. 3. Sigmoid colonic diverticulosis with focal stranding suggesting early diverticulitis. No abscess. Follow-up after resolution of acute process recommended to exclude underlying neoplasm. 4. Aortic atherosclerosis. 5. Prostate gland is enlarged. 6. Bladder wall thickening and heterogeneous appearance of the urine likely representing cystitis. Electronically Signed   By:  WLucienne CapersM.D.   On: 12/02/2020 21:11   DG Chest Portable 1 View  Result Date: 12/02/2020 CLINICAL DATA:  Shortness of breath EXAM: PORTABLE CHEST 1 VIEW COMPARISON:  Chest x-ray 10/06/2020 FINDINGS: The heart and mediastinal contours are unchanged. No focal consolidation. No pulmonary edema. No pleural effusion. No pneumothorax. No acute osseous abnormality. IMPRESSION: No active disease. Electronically Signed   By: MIven FinnM.D.   On: 12/02/2020 20:21   [place]domen Limited RUQ (LIVER/GB)  Result Date: 11/14/2020 CLINICAL DATA:  Elevated LFTs EXAM: ULTRASOUND ABDOMEN LIMITED RIGHT UPPER QUADRANT COMPARISON:  None. FINDINGS: Gallbladder: No gallstones or wall thickening visualized. No sonographic Murphy sign noted by sonographer. Common bile duct: Diameter: 4 mm Liver: No focal lesion identified. Heterogeneous liver echotexture with nodular surface contour. Portal vein is patent on color Doppler imaging with normal direction of blood flow towards the liver. Other: None. Technical note: Examination is limited secondary to poor penetration related to patient body habitus. IMPRESSION: 1. Heterogeneous liver echotexture with nodular surface contour. Sonographic appearance suggestive  of cirrhosis. No focal liver lesion is identified. 2. Unremarkable gallbladder. Electronically Signed   By: Davina Poke D.O.   On: 11/14/2020 17:52    Assessment:   Shyheim Tanney is a 65 y.o. Kerr with recent CVA now largely bedbound with recent ED visit for AMS with CT showing possible early diverticulitis dced on oral abx now called back to ED due to [place]. I suspect contaminant but will await cx.  No fevers, wbc nml   Recommendations Cont vanco, ceftriaxone and flagyl FU Bcx results.  Thank you very much for allowing me to participate in the care of this patient. Please call with questions.   Cheral Marker. Ola Spurr, MD

## 2020-12-04 NOTE — Progress Notes (Signed)
Pt arrived to unt via bed from [facility] ED.  Pt alert, nonverbal and in no apparent distress.  Daughter present.  VS taken. Bedside report given to receiving RN, Rene Paci.

## 2020-12-04 NOTE — ED Notes (Signed)
This RN spoke with pt's daughter, Joslyn Devon, on the phone, with pt's permission. Updated on pt's plan of care. Daughter states she will be coming to visit pt. Today.

## 2020-12-04 NOTE — ED Notes (Signed)
Fsbs 121

## 2020-12-04 NOTE — ED Notes (Signed)
Assisted pt with breakfast. Pt applesauce and 2 bites of pancake. 1 bite of sausage. Pt refuses further feeding. Pt drank cup of water and 1 orange juice.

## 2020-12-04 NOTE — Progress Notes (Signed)
   12/04/20 1930  Clinical Encounter Type  Visited With Patient  Visit Type Initial  Referral From Nurse  Consult/Referral To Chaplain  Spiritual Encounters  Spiritual Needs Literature;Brochure  Chaplain Brookes Craine responded to an OR for AD education with Pt and daughter. Daughter had left upon my arrival. Pt is awake but non verbal. I left one pamphlet for Pt and daughter. I notified Pt's nurse to call the chaplains when daughter returns so that we can go over the AD with her.

## 2020-12-04 NOTE — Progress Notes (Addendum)
PT Cancellation Note  Patient Details Name: Raymond Kerr MRN: [id number] DOB: Jan 05, 1956   Cancelled Treatment:    Reason Eval/Treat Not Completed: Other (comment) Orders received, chart reviewed.  Called ED nurse who reports that pt is going to be admitted and will be getting a room soon/later today on 1C.  Not appropriate/available at this time for PT exam, will attempt as situation and time allows once he is settled in to new room.   Malachi Pro, DPT 12/04/2020, 3:49 PM

## 2020-12-05 ENCOUNTER — Encounter: Payer: Self-pay | Admitting: Internal Medicine

## 2020-12-05 DIAGNOSIS — R7989 Other specified abnormal findings of blood chemistry: Secondary | ICD-10-CM

## 2020-12-05 DIAGNOSIS — Z7189 Other specified counseling: Secondary | ICD-10-CM | POA: Diagnosis not present

## 2020-12-05 DIAGNOSIS — I639 Cerebral infarction, unspecified: Secondary | ICD-10-CM

## 2020-12-05 DIAGNOSIS — K5792 Diverticulitis of intestine, part unspecified, without perforation or abscess without bleeding: Secondary | ICD-10-CM | POA: Diagnosis not present

## 2020-12-05 DIAGNOSIS — Z515 Encounter for palliative care: Secondary | ICD-10-CM

## 2020-12-05 DIAGNOSIS — E1169 Type 2 diabetes mellitus with other specified complication: Secondary | ICD-10-CM | POA: Diagnosis not present

## 2020-12-05 DIAGNOSIS — R7881 Bacteremia: Secondary | ICD-10-CM | POA: Diagnosis not present

## 2020-12-05 DIAGNOSIS — B370 Candidal stomatitis: Secondary | ICD-10-CM | POA: Diagnosis not present

## 2020-12-05 LAB — GLUCOSE, CAPILLARY
Glucose-Capillary: 106 mg/dL — ABNORMAL HIGH (ref 70–99)
Glucose-Capillary: 159 mg/dL — ABNORMAL HIGH (ref 70–99)
Glucose-Capillary: 171 mg/dL — ABNORMAL HIGH (ref 70–99)
Glucose-Capillary: 140 mg/dL — ABNORMAL HIGH (ref 70–99)

## 2020-12-05 LAB — CREATININE, SERUM
Creatinine, Ser: 0.9 mg/dL (ref 0.61–1.24)
GFR, Estimated: >60 mL/min (ref >60)

## 2020-12-05 LAB — PROCALCITONIN: Procalcitonin: <0.1 ng/mL

## 2020-12-05 MED ORDER — PANTOPRAZOLE SODIUM 40 MG PO TBEC
40.0000 mg | DELAYED_RELEASE_TABLET | Freq: Every evening | ORAL | Status: DC
  Administered 2020-12-06 – 2020-12-12 (×7): 40 mg via ORAL
  Filled 2020-12-05 (×8): qty 1

## 2020-12-05 MED ORDER — ENSURE ENLIVE PO LIQD
237.0000 mL | Freq: Three times a day (TID) | ORAL | Status: DC
  Administered 2020-12-06 – 2020-12-12 (×17): 237 mL via ORAL

## 2020-12-05 NOTE — Consult Note (Addendum)
Consultation Note Date: 12/05/2020   Patient Name: Raymond Kerr  DOB: 14-Sep-1955  MRN: [id number]  Age / Sex: 65 y.o., male  PCP: Tracie Harrier, MD Referring Physician: Loletha Grayer, MD  Reason for Consultation: Establishing goals of care  HPI/Patient Profile: 65 y.o. 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   Clinical Assessment and Goals of Care: I have reviewed medical records including EPIC notes, labs and imaging, received report from RN, assessed the patient.  Raymond Kerr is sitting up quietly in bed.  He does not respond to voice or touch.  He will flutter his right eye when I do a sternal rub, but does not open his eyes or make eye contact.  I do not believe that he is able to make his basic needs known.  There is no family at bedside at this time.  Conference with bedside nursing staff and transition of care team related to patient condition, needs, disposition.    Raymond Kerr had an acute stroke in August of this year.  He has been living with his daughter, Raymond Kerr.  It seems that he is having poor by mouth intake.  24-hour calorie count initiated.   Call to daughter, Lavance Beazer to discuss diagnosis prognosis, [place], EOL wishes, disposition and options.  I introduced Palliative Medicine as specialized medical care for people living with serious illness. It focuses on providing relief from the symptoms and stress of a serious illness. The goal is to improve quality of life for both the patient and the family.  We discussed a  brief life review of the patient Raymond Kerr is unmarried.  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  He currently lives in his daughter Raymond Kerr's home.  He has 3 daughters total who worked as a Musician.    We then focused on their current illness.  We talked about bacteremia and the treatment plan, 24-hour calorie count, PEG tube placement.  I shared that PEG tube placement does not change Raymond Kerr's other health concerns.  Raymond Kerr shares that they had wanted to place a PEG tube prior to discharge from hospital last time but she declined since he was eating well by mouth.  She shares that that would be her preference is that he eats by mouth.  The natural disease trajectory and expectations at EOL were discussed.  Had been eating pretty good prior to hospital,   I attempted to elicit values and goals of care important to the patient.  States would return to Raymond Kerr's home with home health services.  Advanced directives, concepts specific to code status, artifical feeding and hydration, and discussed.  At this point Raymond Kerr would prefer  that he not have a PEG tube, that he eat by mouth but she is excepting of PEG tube if needed.  Remains full scope/full code  Palliative Care services outpatient were explained and offered.  At this point Raymond Kerr states that she would like to talk with her sisters before excepting outpatient palliative care.  Discussed the importance of continued conversation with family and the medical providers regarding overall plan of care and treatment options, ensuring decisions are within the context of the patient's values and GOCs. Questions and concerns were addressed. The family was encouraged to call with questions or concerns.  PMT will continue to support holistically.  Conference with attending, bedside nursing staff, transition of care team related to patient condition, needs, goals of care, disposition.    HCPOA   NEXT OF KIN  -daughter, Raymond Kerr is main contact.  Raymond Kerr is unmarried.  He has 2 other daughters who participate in decision-making.    SUMMARY OF RECOMMENDATIONS   Full scope/full code Time for outcomes Considering PEG tube placement Home with home health At this point considering outpatient palliative.   Code Status/Advance Care Planning: Full code  Symptom Management:  Per hospitalist, no additional needs at this time.  Palliative Prophylaxis:  Oral Care and Turn Reposition  Additional Recommendations (Limitations, Scope, Preferences): Full Scope Treatment  Psycho-social/Spiritual:  Desire for further Chaplaincy support:yes Additional Recommendations: Caregiving  Support/Resources  Prognosis:  Unable to determine, based on outcomes.  Guarded at this point.  Discharge Planning:  To be determined.  PT recommending home with home health.       Primary Diagnoses: Present on Admission:  Bacteremia due to Staphylococcus  Essential hypertension   I have reviewed the medical record, interviewed the patient and family, and examined the patient. The following aspects are pertinent.  Past Medical History:  Diagnosis Date   Diabetes mellitus without complication (HCC)    GERD (gastroesophageal reflux disease)    Hypertension    Stroke [facility])    Social History   Socioeconomic History   Marital status: Widowed    Spouse name: Not on file   Number of children: Not on file   Years of education: Not on file   Highest education level: Not on file  Occupational History   Not on file  Tobacco Use   Smoking status: Never   Smokeless tobacco: Never  Substance and Sexual Activity   Alcohol use: Yes   Drug use: No   Sexual activity: Not on file  Other Topics Concern   Not on file  Social History Narrative   Not on file   Social Determinants of Health   Financial Resource Strain: Not on file  Food Insecurity: Not on file  Transportation Needs: Not on file  Physical  Activity: Not on file  Stress: Not on file  Social Connections: Not on file   Family History  Problem Relation Age of Onset   Prostate cancer Neg Hx    Bladder Cancer Neg Hx    Kidney cancer Neg Hx    Scheduled Meds:  amLODipine  10 mg Oral Daily   aspirin  81 mg Oral Daily   B-complex with vitamin C  1 tablet Oral Daily   chlorhexidine  15 mL Mouth Rinse BID   clopidogrel  75 mg Oral Daily   enoxaparin (LOVENOX) injection  50 mg Subcutaneous QHS   insulin aspart  0-15 Units Subcutaneous TID WC   insulin aspart  0-5 Units Subcutaneous QHS   insulin  glargine-yfgn  8 Units Subcutaneous QHS   mouth rinse  15 mL Mouth Rinse q12n4p   methylphenidate  10 mg Oral BID WC   metroNIDAZOLE  500 mg Oral Q12H   pantoprazole  40 mg Oral QPM   tamsulosin  0.4 mg Oral QPC supper   Continuous Infusions:  cefTRIAXone (ROCEPHIN)  IV 2 g (12/05/20 0509)   fluconazole (DIFLUCAN) IV     vancomycin 750 mg (12/05/20 0828)   PRN Meds:.acetaminophen **OR** acetaminophen, ondansetron **OR** ondansetron (ZOFRAN) IV, traZODone Medications Prior to Admission:  Prior to Admission medications   Medication Sig Start Date End Date Taking? Authorizing Provider  amLODipine (NORVASC) 10 MG tablet Take 1 tablet (10 mg total) by mouth daily. 11/24/20  Yes Angiulli, Lavon Paganini, PA-C  aspirin 81 MG chewable tablet Chew 1 tablet (81 mg total) by mouth daily. 10/25/20  Yes Florencia Reasons, MD  atorvastatin (LIPITOR) 80 MG tablet Take 1 tablet (80 mg total) by mouth daily. 11/24/20  Yes Angiulli, Lavon Paganini, PA-C  B Complex Vitamins (VITAMIN B COMPLEX) TABS Take 1 tablet by mouth daily. 11/24/20  Yes Angiulli, Lavon Paganini, PA-C  blood glucose meter kit and supplies Dispense based on patient and insurance preference. Use up to four times daily as directed. (FOR ICD-10 E10.9, E11.9). 11/24/20  Yes Angiulli, Lavon Paganini, PA-C  Blood Glucose Monitoring Suppl (BLOOD GLUCOSE MONITOR SYSTEM) w/Device KIT Use as directed up to 4 times daily. 11/24/20   Yes Angiulli, Lavon Paganini, PA-C  cefdinir (OMNICEF) 300 MG capsule Take 1 capsule (300 mg total) by mouth 2 (two) times daily for 7 days. 12/02/20 12/09/20 Yes Duffy Bruce, MD  clopidogrel (PLAVIX) 75 MG tablet Take 1 tablet (75 mg total) by mouth daily. 11/24/20  Yes Angiulli, Lavon Paganini, PA-C  glimepiride (AMARYL) 2 MG tablet Take 1 tablet (2 mg total) by mouth daily with breakfast. 11/24/20  Yes Angiulli, Lavon Paganini, PA-C  metFORMIN (GLUCOPHAGE) 500 MG tablet Take 1 tablet (500 mg total) by mouth 2 (two) times daily with a meal. 11/24/20  Yes Angiulli, Lavon Paganini, PA-C  methylphenidate (RITALIN) 10 MG tablet Take 1 tablet (10 mg total) by mouth 2 (two) times daily with breakfast and lunch. 11/24/20  Yes Angiulli, Lavon Paganini, PA-C  metroNIDAZOLE (FLAGYL) 500 MG tablet Take 1 tablet (500 mg total) by mouth 3 (three) times daily for 7 days. 12/02/20 12/09/20 Yes Duffy Bruce, MD  pioglitazone (ACTOS) 30 MG tablet Take 1 tablet by mouth daily. 10/01/20  Yes [provider]  polyethylene glycol (MIRALAX / GLYCOLAX) 17 g packet Take 17 g by mouth 2 (two) times daily. 11/24/20  Yes Angiulli, Lavon Paganini, PA-C  tamsulosin (FLOMAX) 0.4 MG CAPS capsule Take 1 capsule (0.4 mg total) by mouth daily after supper. 11/24/20  Yes Angiulli, Lavon Paganini, PA-C  traZODone (DESYREL) 50 MG tablet Take 0.5 tablets (25 mg total) by mouth at bedtime as needed for sleep. 11/24/20  Yes Angiulli, Lavon Paganini, PA-C  Vitamin D, Ergocalciferol, (DRISDOL) 1.25 MG (50000 UNIT) CAPS capsule Take 1 capsule (50,000 Units total) by mouth once a week. 11/28/20  Yes Angiulli, Lavon Paganini, PA-C   No Known Allergies Review of Systems  Unable to perform ROS: Other   Physical Exam Vitals and nursing note reviewed.  Constitutional:      General: He is not in acute distress.    Appearance: He is ill-appearing.  HENT:     Mouth/Throat:     Mouth: Mucous membranes are moist.  Cardiovascular:  Rate and Rhythm: Normal rate.  Pulmonary:      Effort: Pulmonary effort is normal. No respiratory distress.  Skin:    General: Skin is warm and dry.  Neurological:     Comments: Does not respond to voice or touch    Vital Signs: BP 140/87 (BP Location: Right Arm)   Pulse (!) 102   Temp 98 F (36.7 C)   Resp 20   Ht 6' (1.829 m)   Wt 102.7 kg   SpO2 99%   BMI 30.71 kg/m  Pain Scale: 0-10   Pain Score: 0-No pain   SpO2: SpO2: 99 % O2 Device:SpO2: 99 % O2 Flow Rate: .O2 Flow Rate (L/min): 0 L/min  IO: Intake/output summary: No intake or output data in the 24 hours ending 12/05/20 1440  LBM:   Baseline Weight: Weight: 102.7 kg Most recent weight: Weight: 102.7 kg     Palliative Assessment/Data:   Flowsheet Rows    Flowsheet Row Most Recent Value  Intake Tab   Referral Department Hospitalist  Unit at Time of Referral Med/Surg Unit  Palliative Care Primary Diagnosis Sepsis/Infectious Disease  Date Notified 12/05/20  Palliative Care Type New Palliative care  Reason for referral Clarify Goals of Care  Date of Admission 12/03/20  Date first seen by Palliative Care 12/05/20  # of days Palliative referral response time 0 Day(s)  # of days IP prior to Palliative referral 2  Clinical Assessment   Palliative Performance Scale Score 30%  Pain Max last 24 hours Not able to report  Pain Min Last 24 hours Not able to report  Dyspnea Max Last 24 Hours Not able to report  Dyspnea Min Last 24 hours Not able to report  Psychosocial & Spiritual Assessment   Palliative Care Outcomes        Time In: 1400 Time Out: 1450 Time Total: 50 minutes  Greater than 50%  of this time was spent counseling and coordinating care related to the above assessment and plan.  Signed by: Drue Novel, NP   Please contact Palliative Medicine Team phone at [phone] for questions and concerns.  For individual provider: See Shea Evans

## 2020-12-05 NOTE — Evaluation (Signed)
Occupational Therapy Evaluation Patient Details Name: Raymond Kerr MRN: [id number] DOB: [birth year] Today's Date: 12/05/2020   History of Present Illness Raymond Kerr is a 65 y.o. 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   Clinical Impression   Pt seen for OT evaluation this date in setting of acute hospitalization d/t bacteremia. Pt with recent CIR stay and d/c'ed home to Raymond Kerr's house with hospital bed on 10/4. Pt currently requiring MAX A for supine to long sitting and requiring 2p assist for most other bed mobility. He requires SETUP to MIN A for bed level UB ADLs, and MAX to TOTAL A for bed level LB ADLs. Pt with waxing/waning motivation/participation throughout this session, but overall puts forth reasonable effort. Will continue to follow acutely and recommend that HHOT f/u for ADL mgt/modification education with pt and family in the home environment.      Recommendations for follow up therapy are one component of a multi-disciplinary discharge planning process, led by the attending physician.  Recommendations may be updated based on patient status, additional functional criteria and insurance authorization.   Follow Up Recommendations  Home health OT    Equipment Recommendations  Other (comment) (lift, power chair)    Recommendations for Other Services       Precautions / Restrictions Precautions Precautions: Fall Precaution Comments: R hemi, expressive difficulties Restrictions Weight Bearing Restrictions: No      Mobility Bed Mobility Overal bed mobility: Needs Assistance             General bed mobility comments: MAX/TOTAL A for sup to long sit (from  elevated HOB)    Transfers                 General transfer comment: Deferred    Balance                                           ADL either performed or assessed with clinical judgement   ADL Overall ADL's : Needs assistance/impaired                                       General ADL Comments: requires SETUP to MIN A (to reduce spillage) for hand to mouth for self feeding. Pt requires TOTAL A for LB ADLs.     Vision Ability to See in Adequate Light: 3 Highly impaired Patient Visual Report: Other (comment) (L eye ptosis) Ocular Range of Motion: Impaired-to be further tested in functional context Tracking/Visual Pursuits: Right eye does not track laterally;Right eye does not track medially     Perception     Praxis      Pertinent Vitals/Pain Pain Assessment: Faces Faces Pain Scale: No hurt     Hand Dominance Right   Extremity/Trunk Assessment Upper Extremity Assessment Upper Extremity Assessment: Generalized weakness;RUE deficits/detail;LUE deficits/detail RUE Deficits / Details: unable to initiate volitional movement with R UE, able to tolerate shld ROM to 1/2 range with gentle PROM, noted to be somewhat tight/resistive past 90 degrees. LUE Deficits /  Details: able to initiate volitional movement to elevate shld to ~1/3 range. MMT of grip grossly 4-/5.   Lower Extremity Assessment Lower Extremity Assessment: Defer to PT evaluation;Generalized weakness (R grossly weaker than L, not formally assessed. R essentially unable to particpate in AROM.)       Communication Communication Communication: Expressive difficulties   Cognition Arousal/Alertness: Awake/alert Behavior During Therapy: Flat affect Overall Cognitive Status: No family/caregiver present to determine baseline cognitive functioning                                 General Comments: Pt awake and in chair position when OT presents, he does appear to  appropriate shake head yes/no occasionalyl during session. He also responds moderately well to visual cues (ex, hold muffin up to where patient can reach and ask if he wants it, he reaches out to grab it and performs hand to mouth, however does not initiate hand to mouth from the tray table).   General Comments       Exercises Other Exercises Other Exercises: OT engaes pt in self-feeding with cues to sequence, locate items and motivate pt to participate. He is able to self feed wtih SETUP for finger foods such as muffin, cut up; and requires tactile/visual cue to initiate. Pt requires MIN A for drinking milk from straw mostly to reduce spillage as well as cues to slow down as he attempts to consume liquids too quickly. Pt able to perform oral care wtih swab with SETUP and requires MIN A to scrub dead skin from lips and moisturize wtih balm. All performed at bed level, bed positioned in chair position.   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 After inpatient rehab, has been staying wtih Raymond Kerr. Chart review indicates she is disabled and unable to help mobilize.  Lives With: Raymond Kerr    Prior Functioning/Environment          Comments: Raymond Kerr called and provided history and prior level of function. Pt has a hospital bed and was in rehab for 1 month with limited progress.        OT Problem List: Decreased strength;Decreased range of motion;Decreased activity tolerance;Impaired balance (sitting and/or standing);Impaired vision/perception;Decreased coordination;Decreased  cognition;Impaired sensation;Impaired tone;Impaired UE functional use;Increased edema      OT Treatment/Interventions: Self-care/ADL training;Therapeutic exercise;DME and/or AE instruction    OT Goals(Current goals can be found in the care plan section) Acute Rehab OT Goals Patient Stated Goal: unable to participate OT Goal Formulation: Patient unable to participate in goal setting Time For Goal Achievement: 12/19/20 Potential to Achieve Goals: Fair ADL Goals Pt Will Perform Eating: with modified independence;with set-up;sitting (chair position) Pt Will Perform Grooming: with modified independence;with set-up;bed level (chair position) Additional ADL Goal #1: Pt will be able to participate in bed level repositioning wtih MOD/MAX A of one person (at least rolling/rotating sides) to engage in pressure relief for skin integrity.  OT Frequency: Min 1X/week   Barriers to D/C:  Co-evaluation              AM-PAC OT "6 Clicks" Daily Activity     Outcome Measure Help from another person eating meals?: A Little Help from another person taking care of personal grooming?: A Little Help from another person toileting, which includes using toliet, bedpan, or urinal?: Total Help from another person bathing (including washing, rinsing, drying)?: A Lot Help from another person to put on and taking off regular upper body clothing?: A Lot Help from another person to put on and taking off regular lower body clothing?: Total 6 Click Score: 12   End of Session Nurse Communication: Mobility status  Activity Tolerance: Patient tolerated treatment well Patient left: in bed;with call bell/phone within reach;with bed alarm set  OT Visit Diagnosis: Muscle weakness (generalized) (M62.81);Other symptoms and signs involving the nervous system (R29.898);Hemiplegia and hemiparesis Hemiplegia - Right/Left: Right Hemiplegia - dominant/non-dominant: Dominant Hemiplegia - caused by: Cerebral  infarction                Time: 2423-5361 OT Time Calculation (min): 28 min Charges:  OT General Charges $OT Visit: 1 Visit OT Evaluation $OT Eval Moderate Complexity: 1 Mod OT Treatments $Self Care/Home Management : 8-22 mins  Rejeana Brock, MS, OTR/L ascom [phone] 12/05/20, 1:30 PM

## 2020-12-05 NOTE — Evaluation (Addendum)
Clinical/Bedside Swallow Evaluation Patient Details  Name: Raymond Kerr MRN: [id number] Date of Birth: [birth year]  Today's Date: 12/05/2020 Time: SLP Start Time (ACUTE ONLY): 1100 SLP Stop Time (ACUTE ONLY): 1200 SLP Time Calculation (min) (ACUTE ONLY): 60 min  Past Medical History:  Past Medical History:  Diagnosis Date   Diabetes mellitus without complication (HCC)    GERD (gastroesophageal reflux disease)    Hypertension    Stroke [facility])    Past Surgical History:  Past Surgical History:  Procedure Laterality Date   TEE WITHOUT CARDIOVERSION N/A 10/08/2020   Procedure: TRANSESOPHAGEAL ECHOCARDIOGRAM (TEE);  Surgeon: Lamar Blinks, MD;  Location: [facility] ORS;  Service: Cardiovascular;  Laterality: N/A;   HPI:  Pt is a 65 y.o. 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  History limited due to nonverbal status d/t Significant Aphasia baseline, but able to nod and shake his head and answer to questions.  Family reported the patient has been increasingly confused throughout day of admit.  Patient has a recent stroke in August and received Rehab at [facility].  He is essentially bedbound.  He was found to be tachycardic and smelling of urine at admit.  CXR: No active disease.    Assessment / Plan / Recommendation  Clinical Impression  Pt seen for a BSE. Pt is primarily nonverbal w/ Significant Receptive and Expressive Aphasia. He had a recent L CVA in multiple distributions with right hemiparesis, aphasia and dysphagia in 09/2020. He completed CIR Rehab and was just D/C'd ~10 days ago. Daughter stated he has been eating/drinking thin liquids and soft foods, but has not  been taking much po "sometimes". She is concerned about his intake overall.  At this BSE, pt appears to present w/ oropharyngeal phase dysphagia w/ added impact from Language/Aphasia deficits on his overall awareness/attention during po tasks. Oropharyngeal phase deficits noted w/ neuromuscular deficits noted(R oral weakness). Pt consumed po trials of thin liquids Via Cup, purees, and soft solids w/ No overt, clinical s/s of aspiration during po trials except w/ trial of thin liquids Via Straw -- this was discontinued. He was given support/Supervision w/ aspiration precautions active. Pt's risk for aspiration appears reduced following aspiration precautions; Supervision and Support given to lessen risk for aspiration.   During po trials, pt consumed thin liquids Via Cup, and purees/soft solids w/ No overt coughing, change/decline in respiratory presentation during/post trials. O2 sats remained 98%. Pharyngeal phase c/b mild suspicion of delayed pharyngeal swallow initiation d/t audible and multiple swallows intermittently. Pt was monitored for Impulsive drinking behavior w/ cues given to limit and reduce this behavior. Min-Mod deficits noted during the oral phase c/b slow oral motor movements for timely bolus management and bolus organization/propulsion for A-P transfer for swallowing, R lingual/lateral weakness present; min diffuse oral residue noted dependent on bolus consistency/texture, min oral holding w/ loss of attention to bolus, and lengthy masticaiton time w/ increased texture. Given Time and often alternating boluses, oral clearing achieved w/ all trials. OM Exam revealed R labial and lingual unilateral weakness w/ decreased tone; deviation to R. Pt required feeding assistance and verbal/tactile cues during tasks -- setup for holding Cup for drinking.   Similar presentation as per  notes while at CIR prior to D/C ~2 weeks ago.       Recommend continue a Dysphagia level 3 diet w/ Minced meats,  gravies; Thin liquids VIA CUP. Recommend aspiration precautions, monitor for oral clearing. Setup and support w/ feeding at meals. Check for oral clearing and monitor for Impulsive drinking behaviors. Pills CRUSHED in Puree for safer, easier swallowing. Education given on Pills in Puree; food consistency and monitoring of oral clearing and swallowing w/ boluses; aspiration precautions to Daughter present and NSG and posted. MD/NSG updated and agreed. Pt appears at/near his baseline per chart notes. No skilled needs indicated currently.   Daughter had questions re: PEG placement for support for pt's overall intake for good nutrition/hydration; encouraged her to d/w MD/Palliative Care. SLP Visit Diagnosis: Dysphagia, oropharyngeal phase (R13.12) (baseline; Aphasia - chronic)    Aspiration Risk  Mild aspiration risk;Risk for inadequate nutrition/hydration    Diet Recommendation   Dysphagia level 3 diet w/ Minced meats, gravies; Thin liquids VIA CUP. Recommend aspiration precautions, monitor for oral clearing. Setup and support w/ feeding at meals. Check for oral clearing and monitor for Impulsive drinking behaviors.   Medication Administration: Crushed with puree    Other  Recommendations Recommended Consults:  (Dietician f/u; Palliative Care for GOC) Oral Care Recommendations: Oral care BID;Oral care before and after PO;Staff/trained caregiver to provide oral care Other Recommendations:  (n/a)    Recommendations for follow up therapy are one component of a multi-disciplinary discharge planning process, led by the attending physician.  Recommendations may be updated based on patient status, additional functional criteria and insurance authorization.  Follow up Recommendations None      Frequency and Duration  (n/a)   (n/a)       Prognosis Prognosis for Safe Diet Advancement: Fair Barriers to Reach Goals: Cognitive deficits;Language deficits;Severity of deficits;Time post onset;Behavior       Swallow Study   General Date of Onset: 12/02/20 HPI: Pt is a 65 y.o. 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  History limited due to nonverbal status d/t Significant Aphasia baseline, but able to nod and shake his head and answer to questions.  Family reported the patient has been increasingly confused throughout day of admit.  Patient has a recent stroke in August and received Rehab at [facility]  He is essentially bedbound.  He was found to be tachycardic and smelling of urine at admit.  CXR: No active disease. Type of Study: Bedside Swallow Evaluation Previous Swallow Assessment: 8/18 and 10/13/2020 Diet Prior to this Study: Dysphagia 3 (soft);Thin liquids Temperature Spikes Noted: No (wbc 7.7) Respiratory Status: Room air History of Recent Intubation: No Behavior/Cognition: Alert;Cooperative;Pleasant mood;Requires cueing;Distractible (Aphasia) Oral Cavity Assessment: Within Functional Limits Oral Care Completed by SLP: Recent completion by staff Oral Cavity - Dentition: Adequate natural dentition;Missing dentition (loose lower Dentition) Vision: Functional for self-feeding (closed Left eye) Self-Feeding Abilities: Able to feed self;Needs assist;Needs set up;Total assist (held cup to drink) Patient Positioning: Upright in bed (needed positioning) Baseline Vocal Quality: Low vocal intensity (mumbled few words/phonations) Volitional Cough: Cognitively unable to elicit Volitional Swallow: Unable to elicit    Oral/Motor/Sensory Function Overall Oral Motor/Sensory Function: Moderate impairment Facial ROM:  Reduced right;Suspected CN VII (facial) dysfunction Facial  Symmetry: Abnormal symmetry right;Suspected CN VII (facial) dysfunction Facial Strength: Reduced right;Suspected CN VII (facial) dysfunction Facial Sensation: Reduced right;Suspected CN V (Trigeminal) dysfunction Lingual ROM: Reduced right;Suspected CN XII (hypoglossal) dysfunction Lingual Symmetry: Abnormal symmetry right;Suspected CN XII (hypoglossal) dysfunction Lingual Strength: Reduced;Suspected CN XII (hypoglossal) dysfunction Lingual Sensation: Reduced;Suspected CN VII (facial) dysfunction-anterior 2/3 tongue Velum: Within Functional Limits Mandible: Within Functional Limits   Ice Chips Ice chips: Not tested   Thin Liquid Thin Liquid: Impaired (w/ Straw;  VIA CUP - adequate) Presentation: Cup;Straw Oral Phase Impairments: Poor awareness of bolus (impulsively drank) Pharyngeal  Phase Impairments:  (None) Other Comments: impulsive and required verbal/tactile cues by clinician; Cup drinking    Nectar Thick Nectar Thick Liquid: Not tested   Honey Thick Honey Thick Liquid: Not tested   Puree Puree: Within functional limits Presentation: Spoon (fed; 4 trials)   Solid     Solid: Impaired Presentation: Spoon (fed; 5 trials) Oral Phase Impairments: Poor awareness of bolus;Impaired mastication (lengthy) Oral Phase Functional Implications: Impaired mastication;Prolonged oral transit Pharyngeal Phase Impairments: Multiple swallows Other Comments: slow motor engagement        Jerilynn Som, MS, [employer] [facility] [phone] Raymond Kerr 12/05/2020,3:30 PM

## 2020-12-05 NOTE — Progress Notes (Signed)
[facility] CLINIC INFECTIOUS DISEASE PROGRESS NOTE Date of Admission:  12/03/2020     ID: Raymond Kerr is a 65 y.o. male with  Bacteremia Principal Problem:   Bacteremia due to Staphylococcus Active Problems:   Type 2 diabetes mellitus with hyperlipidemia (HCC)   Essential hypertension   Spastic hemiparesis of right dominant side as late effect of cerebrovascular disease (HCC)   Aphasia as late effect of cerebrovascular accident   Diverticulitis   Subjective: No fevers, no new issues  ROS unable to obtain  Medications:  Antibiotics Given (last 72 hours)     Date/Time Action Medication Dose Rate   12/03/20 2111 New Bag/Given   piperacillin-tazobactam (ZOSYN) IVPB 3.375 g 3.375 g 100 mL/hr   12/03/20 2152 New Bag/Given   vancomycin (VANCOREADY) IVPB 2000 mg/400 mL 2,000 mg 200 mL/hr   12/04/20 0602 New Bag/Given   cefTRIAXone (ROCEPHIN) 2 g in sodium chloride 0.9 % 100 mL IVPB 2 g 200 mL/hr   12/04/20 0918 New Bag/Given   vancomycin (VANCOREADY) IVPB 750 mg/150 mL 750 mg 150 mL/hr   12/04/20 3557 Given   metroNIDAZOLE (FLAGYL) tablet 500 mg 500 mg    12/04/20 2140 Given   metroNIDAZOLE (FLAGYL) tablet 500 mg 500 mg    12/04/20 2150 New Bag/Given   vancomycin (VANCOREADY) IVPB 750 mg/150 mL 750 mg 150 mL/hr   12/05/20 0509 New Bag/Given   cefTRIAXone (ROCEPHIN) 2 g in sodium chloride 0.9 % 100 mL IVPB 2 g 200 mL/hr   12/05/20 3220 Given   metroNIDAZOLE (FLAGYL) tablet 500 mg 500 mg    12/05/20 2542 New Bag/Given   vancomycin (VANCOREADY) IVPB 750 mg/150 mL 750 mg 150 mL/hr       amLODipine  10 mg Oral Daily   aspirin  81 mg Oral Daily   atorvastatin  80 mg Oral Daily   B-complex with vitamin C  1 tablet Oral Daily   chlorhexidine  15 mL Mouth Rinse BID   clopidogrel  75 mg Oral Daily   enoxaparin (LOVENOX) injection  50 mg Subcutaneous QHS   insulin aspart  0-15 Units Subcutaneous TID WC   insulin aspart  0-5 Units Subcutaneous QHS   insulin glargine-yfgn  8 Units  Subcutaneous QHS   mouth rinse  15 mL Mouth Rinse q12n4p   methylphenidate  10 mg Oral BID WC   metroNIDAZOLE  500 mg Oral Q12H   tamsulosin  0.4 mg Oral QPC supper    Objective: Vital signs in last 24 hours: Temp:  [98 F (36.7 C)-99.2 F (37.3 C)] 98 F (36.7 C) (10/14 1207) Pulse Rate:  [85-102] 102 (10/14 1207) Resp:  [16-20] 20 (10/14 1207) BP: (126-155)/(71-128) 140/87 (10/14 1207) SpO2:  [95 %-100 %] 99 % (10/14 1207) Constitutional: aphasic, tries to verbalize HENT: anicteric Mouth/Throat: mild facial droop Cardiovascular: Normal rate, regular rhythm and normal heart sounds. Exam reveals no gallop and no friction rub.  No murmur heard.  Pulmonary/Chest: Effort normal and breath sounds normal. No respiratory distress. He has no wheezes.  Abdominal: Soft. Bowel sounds are normal. He exhibits no distension. There is no tenderness.  Lymphadenopathy:  He has no cervical adenopathy.  Neurological: aphasic, L hemiparesisi Skin: Skin is warm and dry. No rash noted. No erythema.  Psychiatric: aphasix, unable to assess  Lab Results Recent Labs    12/02/20 1819 12/03/20 2006 12/05/20 0427  WBC 8.5 7.7  --   HGB 13.5 13.2  --   HCT 40.0 38.7*  --   NA  138 135  --   K 3.9 3.5  --   CL 105 104  --   CO2 25 21*  --   BUN 15 14  --   CREATININE 0.99 1.01 0.90    Microbiology: Results for orders placed or performed during the hospital encounter of 12/03/20  Blood culture (routine x 2)     Status: None (Preliminary result)   Collection Time: 12/03/20  8:06 PM   Specimen: BLOOD RIGHT FOREARM  Result Value Ref Range Status   Specimen Description BLOOD RIGHT FOREARM  Final   Special Requests   Final    BOTTLES DRAWN AEROBIC AND ANAEROBIC Blood Culture results may not be optimal due to an excessive volume of blood received in culture bottles   Culture   Final    NO GROWTH 2 DAYS Performed at [facility], [address], [place], [place] [address]    Report  Status PENDING  Incomplete  Resp Panel by RT-PCR (Flu A&B, Covid) Nasopharyngeal Swab     Status: None   Collection Time: 12/03/20  8:45 PM   Specimen: Nasopharyngeal Swab; Nasopharyngeal(NP) swabs in vial transport medium  Result Value Ref Range Status   SARS Coronavirus 2 by RT PCR NEGATIVE NEGATIVE Final    Comment: (NOTE) SARS-CoV-2 target nucleic acids are NOT DETECTED.  The SARS-CoV-2 RNA is generally detectable in upper respiratory specimens during the acute phase of infection. The lowest concentration of SARS-CoV-2 viral copies this assay can detect is 138 copies/mL. A negative result does not preclude SARS-Cov-2 infection and should not be used as the sole basis for treatment or other patient management decisions. A negative result may occur with  improper specimen collection/handling, submission of specimen other than nasopharyngeal swab, presence of viral mutation(s) within the areas targeted by this assay, and inadequate number of viral copies(<138 copies/mL). A negative result must be combined with clinical observations, patient history, and epidemiological information. The expected result is Negative.  Fact Sheet for Patients:  BloggerCourse.com  Fact Sheet for Healthcare Providers:  SeriousBroker.it  This test is no t yet approved or cleared by the [place] FDA and  has been authorized for detection and/or diagnosis of SARS-CoV-2 by FDA under an Emergency Use Authorization (EUA). This EUA will remain  in effect (meaning this test can be used) for the duration of the COVID-19 declaration under Section 564(b)(1) of the Act, 21 U.S.C.section 360bbb-3(b)(1), unless the authorization is terminated  or revoked sooner.       Influenza A by PCR NEGATIVE NEGATIVE Final   Influenza B by PCR NEGATIVE NEGATIVE Final    Comment: (NOTE) The Xpert Xpress SARS-CoV-2/FLU/RSV plus assay is intended as an aid in the diagnosis  of influenza from Nasopharyngeal swab specimens and should not be used as a sole basis for treatment. Nasal washings and aspirates are unacceptable for Xpert Xpress SARS-CoV-2/FLU/RSV testing.  Fact Sheet for Patients: BloggerCourse.com  Fact Sheet for Healthcare Providers: SeriousBroker.it  This test is not yet approved or cleared by the [place] FDA and has been authorized for detection and/or diagnosis of SARS-CoV-2 by FDA under an Emergency Use Authorization (EUA). This EUA will remain in effect (meaning this test can be used) for the duration of the COVID-19 declaration under Section 564(b)(1) of the Act, 21 U.S.C. section 360bbb-3(b)(1), unless the authorization is terminated or revoked.  Performed at [facility], [address], [place], [place] [address]   MRSA Next Gen by PCR, Nasal  Status: None   Collection Time: 12/03/20  8:51 PM   Specimen: Nasal Mucosa; Nasal Swab  Result Value Ref Range Status   MRSA by PCR Next Gen NOT DETECTED NOT DETECTED Final    Comment: (NOTE) The GeneXpert MRSA Assay (FDA approved for NASAL specimens only), is one component of a comprehensive MRSA colonization surveillance program. It is not intended to diagnose MRSA infection nor to guide or monitor treatment for MRSA infections. Test performance is not FDA approved in patients less than 76 years old. Performed at [facility], [address] [place], [place] [address]   Blood culture (routine x 2)     Status: None (Preliminary result)   Collection Time: 12/03/20  9:00 PM   Specimen: BLOOD  Result Value Ref Range Status   Specimen Description BLOOD LEFT ASSIST CONTROL  Final   Special Requests   Final    BOTTLES DRAWN AEROBIC AND ANAEROBIC Blood Culture results may not be optimal due to an inadequate volume of blood received in culture bottles   Culture   Final    NO GROWTH 2 DAYS Performed at [facility], [address], [place], [place] [address]    Report Status PENDING  Incomplete    Studies/Results: No results found.  Assessment/Plan: Raymond Kerr is a 65 y.o. male with recent CVA now largely bedbound with recent ED visit for AMS with CT showing possible early diverticulitis dced on oral abx now called back to ED due to [facility] + staph species. I suspect contaminant but will await cx.  No fevers, wbc nml    Recommendations Cont vanco, ceftriaxone and flagyl FU Bcx results. If fu negative tomorrow please DC vanco and can transition to augmentin for a few more days for diverticulitis  Thank you very much for the consult. Will follow with you.  Mick Sell   12/05/2020, 1:08 PM

## 2020-12-05 NOTE — TOC Progression Note (Signed)
Transition of Care [facility]) - Progression Note    Patient Details  Name: Raymond Kerr MRN: [id number] Date of Birth: May 31, 1955  Transition of Care [facility] CM/SW Contact  Caryn Section, RN Phone Number: 12/05/2020, 4:23 PM  Clinical Narrative:   Attempted to see patient x 2 but he was sleeping soundly.  Care team aware patient is Medicaid potential and possibly not eligible for Home Health.         Expected Discharge Plan and Services                                                 Social Determinants of Health (SDOH) Interventions    Readmission Risk Interventions No flowsheet data found.

## 2020-12-05 NOTE — Progress Notes (Signed)
Initial Nutrition Assessment  DOCUMENTATION CODES:  Non-severe (moderate) malnutrition in context of chronic illness  INTERVENTION:  Continue current diet as ordered, encourage PO intake Nursing to assist with set-up and feeding pt Continue MVI Ensure Enlive po TID, each supplement provides 350 kcal and 20 grams of protein   48-hour calorie count per MD - will assess intake over the weekend on Monday Pt will likely benefit from PEG placement to ensure nutrition needs are met.  NUTRITION DIAGNOSIS:  Moderate Malnutrition (in the context of chronic illness (CVA with residual deficits)) related to dysphagia (altered mental status) as evidenced by mild muscle depletion, percent weight loss (6.6% x 1 month).  GOAL:  Patient will meet greater than or equal to 90% of their needs  MONITOR:  PO intake, Supplement acceptance, Labs, Weight trends  REASON FOR ASSESSMENT:  Consult Calorie Count  ASSESSMENT:  65 y.o. male with history of HTN, HLD, DM, GERD and recent CVA (right hemiparesis, aphasia and dysphagia) presented to ED after blood culture grew back staph positive cocci. Pt had been seen in ED the night before due to AMS.   Pt admitted to [facility] in August for almost a month at the time of his CVA. Well known to this RD from last admission. After leaving [facility], pt was placed in rehab at [facility]. Reviewed therapy notes from rehab and appears that pt requires max assistance with most activities. Daughter reports pt is nonverbal and essentially bed bound at home.   During last admission, RD recommended placement of PEG tube as pt's intake was poor and was not able to meet nutrition needs. Family declined despite discussion with both RD and SLP on the benefits it could provide while pt was in therapy. Pt was followed by RD at rehab and noted that pt's appetite remained poor.  Weight hx shows a 6.6% weight loss in the last month, which is severe.  Daughter reports concern over pt's appetite  with MD. MD requests 48-hour calorie count be initiated. Placed envelope on door and discussed with RN. RN reports pt ate 0% of his breakfast this AM  Pt resting in bed at the time of visit, no family present. Pt sleeping and did not open eyes when name was called or during physical assessment. Pt mostly nonverbal and unable to provide nutrition hx. Upon physical assessment, patient still with adequate fat stores. Palpated muscles and some deficits are apparent, suspect muscle losses are more severe but being obscured by fat stores.   Lunch tray noted to be untouched at bedside.    Pt would benefit from nutrient dense supplement. One Ensure Enlive supplement provides 350 kcals, 20 grams protein, and 44-45 grams of carbohydrate vs one Glucerna shake supplement, which provides 220 kcals, 10 grams of protein, and 26 grams of carbohydrate. Given pt's hx of DM, RD will reassess adequacy of PO intake, CBGS, and adjust supplement regimen as appropriate at follow-up.  Nutritionally Relevant Medications: Scheduled Meds:  B-complex with vitamin C  1 tablet Oral Daily   insulin aspart  0-15 Units Subcutaneous TID WC   insulin aspart  0-5 Units Subcutaneous QHS   insulin glargine-yfgn  8 Units Subcutaneous QHS   metroNIDAZOLE  500 mg Oral Q12H   pantoprazole  40 mg Oral QPM   Continuous Infusions:  cefTRIAXone (ROCEPHIN)  IV 2 g (12/05/20 0509)   vancomycin 750 mg (12/05/20 0828)   PRN Meds: ondansetron  Labs Reviewed  NUTRITION - FOCUSED PHYSICAL EXAM: Flowsheet Row Most Recent Value  Orbital Region No depletion  Upper Arm Region No depletion  Thoracic and Lumbar Region No depletion  Buccal Region No depletion  Temple Region No depletion  Clavicle Bone Region No depletion  Clavicle and Acromion Bone Region Mild depletion  Scapular Bone Region Mild depletion  Dorsal Hand Mild depletion  Patellar Region Mild depletion  Anterior Thigh Region Mild depletion  Posterior Calf Region Mild  depletion  Edema (RD Assessment) Mild  Hair Reviewed  Eyes Unable to assess  Mouth Unable to assess  Skin Reviewed  Nails Reviewed   Diet Order:   Diet Order             DIET DYS 3 Room service appropriate? Yes with Assist; Fluid consistency: Thin  Diet effective now                   EDUCATION NEEDS:  No education needs have been identified at this time  Skin:  Skin Assessment: Reviewed RN Assessment  Last BM:  10/13 - type 6  Height:  Ht Readings from Last 1 Encounters:  12/03/20 6' (1.829 m)   Weight:  Wt Readings from Last 1 Encounters:  12/03/20 102.7 kg   Ideal Body Weight:  80.9 kg  BMI:  Body mass index is 30.71 kg/m.  Estimated Nutritional Needs:  Kcal:  2200-2400 kcal/d Protein:  110-130 g/d Fluid:  2-2.2 L/d   Ranell Patrick, RD, LDN Clinical Dietitian RD pager # available in [place]  After hours/weekend pager # available in [facility]

## 2020-12-05 NOTE — Progress Notes (Signed)
Patient ID: Raymond Kerr, male   DOB: 07-08-1955, 65 y.o.   MRN: [id number] [facility] PROGRESS NOTE  Raymond Kerr FFM:[id number] DOB: 1955-05-23 DOA: 12/03/2020 PCP: Barbette Reichmann, MD  HPI/Subjective: The patient answering simple yes or no questions today.  No pain in his abdomen.  No nausea or vomiting.  Needs to be fed.  Patient's daughter concerned about his intake.  Objective: Vitals:   12/05/20 0759 12/05/20 1207  BP: (!) 134/94 140/87  Pulse: 85 (!) 102  Resp: 20 20  Temp: 98.2 F (36.8 C) 98 F (36.7 C)  SpO2: 100% 99%   No intake or output data in the 24 hours ending 12/05/20 1405 Filed Weights   12/03/20 1955  Weight: 102.7 kg    ROS: Review of Systems  Respiratory:  Negative for shortness of breath.   Cardiovascular:  Negative for chest pain.  Gastrointestinal:  Negative for abdominal pain.  Exam: Physical Exam HENT:     Head: Normocephalic.     Mouth/Throat:     Pharynx: No oropharyngeal exudate.  Eyes:     General: Lids are normal.     Conjunctiva/sclera: Conjunctivae normal.  Cardiovascular:     Rate and Rhythm: Normal rate and regular rhythm.     Heart sounds: Normal heart sounds, S1 normal and S2 normal.  Pulmonary:     Breath sounds: No decreased breath sounds, wheezing, rhonchi or rales.  Abdominal:     Palpations: Abdomen is soft.     Tenderness: There is no abdominal tenderness.  Musculoskeletal:     Right lower leg: No swelling.     Left lower leg: No swelling.  Skin:    General: Skin is warm.     Findings: No rash.  Neurological:     Mental Status: He is alert.     Comments: Answer some simple yes or no questions but when he is trying to talk hard to understand.      Scheduled Meds:  amLODipine  10 mg Oral Daily   aspirin  81 mg Oral Daily   B-complex with vitamin C  1 tablet Oral Daily   chlorhexidine  15 mL Mouth Rinse BID   clopidogrel  75 mg Oral Daily   enoxaparin (LOVENOX) injection  50 mg Subcutaneous QHS    insulin aspart  0-15 Units Subcutaneous TID WC   insulin aspart  0-5 Units Subcutaneous QHS   insulin glargine-yfgn  8 Units Subcutaneous QHS   mouth rinse  15 mL Mouth Rinse q12n4p   methylphenidate  10 mg Oral BID WC   metroNIDAZOLE  500 mg Oral Q12H   tamsulosin  0.4 mg Oral QPC supper   Continuous Infusions:  cefTRIAXone (ROCEPHIN)  IV 2 g (12/05/20 0509)   fluconazole (DIFLUCAN) IV     vancomycin 750 mg (12/05/20 5701)    Assessment/Plan:  Bacteremia versus skin contamination with Staphylococcus species growing out of blood culture on 12/02/2020.  Currently on vancomycin. Diverticulitis seen on CT scan of the abdomen pelvis.  Patient on Rocephin and Flagyl. Thrush on IV Diflucan. Completed nonhemorrhagic CVA with right-sided hemiparesis and aphasia.  Patient on aspirin Plavix and will hold Lipitor with elevated liver function test Elevated liver function tests repeat tomorrow and hold Lipitor. Type 2 diabetes mellitus with hyperlipidemia.  Holding Lipitor with elevated liver function test.  Holding metformin and glimepiride on low-dose Semglee insulin while here in the hospital. Essential hypertension on Norvasc BPH on Flomax Patient's daughter concerned about calorie intake.  We will get  a calorie count over the next 24 to 48 hours.  Last time they were in the hospital there was mention of a possible PEG tube.        Code Status:     Code Status Orders  (From admission, onward)           Start     Ordered   12/03/20 2139  Full code  Continuous        12/03/20 2141           Code Status History     Date Active Date Inactive Code Status Order ID Comments User Context   10/24/2020 1646 11/26/2020 0141 Full Code [id number]  Lynnae Prude Inpatient   10/24/2020 1646 10/24/2020 1646 Full Code [id number]  Lynnae Prude Inpatient   10/06/2020 0233 10/24/2020 1644 Full Code [id number]  Mansy, Vernetta Honey, MD ED      Family Communication: Updated patient's  daughter on the phone Disposition Plan: Status is: Inpatient  Consultants: Infectious disease  Antibiotics: Vancomycin, Rocephin, Flagyl  Time spent: 28 minutes  [employer]

## 2020-12-05 NOTE — Evaluation (Signed)
Physical Therapy Evaluation Patient Details Name: Raymond Kerr MRN: [id number] DOB: 26-Jun-1955 Today's Date: 12/05/2020  History of Present Illness  Raymond Kerr is a 65 y.o. 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   Clinical Impression  Pt is a pleasant 65 year old male who presents to PT evaluation after readmission for acute diverticulitis and abnormal blood cultures. Pt with expressive aphasia and unable to provide history and very lethargic this morning requiring sternal rub to arouse. Pt was previously at [facility] for month of September with little success to improve functional mobility and was discharged with a hospital bed. Pt's daughter called to provide prior level of function and reports that she plans to take the patient home at discharge. Upon evaluation, pt requiring totalA + 2 to complete supine <> sit at EOB and pt able to maintain static sitting balance with intermittent minA. Recommending HHPT at discharge for further family training for mobility related tasks. Pt will benefit from further skilled therapy to assist with reducing care giver burden and increasing pt participation in care.       Recommendations for follow up therapy are one component of a multi-disciplinary discharge planning process, led by the attending physician.  Recommendations may be updated based on patient status, additional functional criteria and insurance authorization.  Follow Up Recommendations Home health PT;Supervision/Assistance - 24 hour    Equipment Recommendations  None recommended by PT    Recommendations for Other Services       Precautions / Restrictions Precautions Precautions:  Fall Precaution Comments: R hemi, expressive difficulties Restrictions Weight Bearing Restrictions: No      Mobility  Bed Mobility Overal bed mobility: Needs Assistance Bed Mobility: Supine to Sit;Sit to Supine     Supine to sit: Total assist;+2 for physical assistance;HOB elevated Sit to supine: Total assist;+2 for physical assistance   General bed mobility comments: TotalA + 2 for all bed mobility    Transfers                 General transfer comment: Deferred  Ambulation/Gait             General Gait Details: Pt non-ambulatory at baseline per CIR notes  Stairs            Wheelchair Mobility    Modified Rankin (Stroke Patients Only)       Balance Overall balance assessment: Needs assistance Sitting-balance support: No upper extremity supported;Feet supported Sitting balance-Leahy Scale: Fair Sitting balance - Comments: Required intermittent MinA to regain balance.           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 After inpatient rehab, has been staying wtih daughter. Chart review indicates she is disabled and unable to help mobilize.    Prior Function           Comments: Daughter called and  provided history and prior level of function. Pt has a hospital bed and was in rehab for 1 month with limited progress.     Hand Dominance   Dominant Hand: Right    Extremity/Trunk Assessment   Upper Extremity Assessment Upper Extremity Assessment: Generalized weakness;RUE deficits/detail;LUE  deficits/detail RUE Deficits / Details: unable to initiate volitional movement with R UE, able to tolerate shld ROM to 1/2 range with gentle PROM, noted to be somewhat tight/resistive past 90 degrees. LUE Deficits / Details: able to initiate volitional movement to elevate shld to ~1/3 range. MMT of grip grossly 4-/5.    Lower Extremity Assessment Lower Extremity Assessment: Defer to PT evaluation;Generalized weakness (R grossly weaker than L, not formally assessed. R essentially unable to particpate in AROM.) RLE Deficits / Details: No volitional movement noted in R LE. Increased R hamstring tone noted and decreased R knee extension PROM. Able to reach neutral DF with PROM LLE Deficits / Details: Pt unable to perform volitional movement on L LE. Increased hamstring tightness in sitting. Able to reach neutral DF in R ankle with PROM.       Communication   Communication: Expressive difficulties  Cognition Arousal/Alertness: Awake/alert Behavior During Therapy: Flat affect Overall Cognitive Status: No family/caregiver present to determine baseline cognitive functioning            General Comments: Pt awake and in chair position when OT presents, he does appear to appropriate shake head yes/no occasionalyl during session. He also responds moderately well to visual cues (ex, hold muffin up to where patient can reach and ask if he wants it, he reaches out to grab it and performs hand to mouth, however does not initiate hand to mouth from the tray table).      General Comments      Exercises     Assessment/Plan    PT Assessment Patient needs continued PT services  PT Problem List Decreased strength;Decreased range of motion;Decreased activity tolerance;Decreased balance;Decreased mobility;Decreased coordination;Decreased cognition;Decreased safety awareness;Impaired sensation;Impaired tone;Obesity       PT Treatment Interventions DME instruction;Functional mobility training;Therapeutic  activities;Therapeutic exercise;Gait training;Balance training;Neuromuscular re-education;Cognitive remediation;Patient/family education;Wheelchair mobility training;Manual techniques;Modalities    PT Goals (Current goals can be found in the Care Plan section)  Acute Rehab PT Goals Patient Stated Goal: unable to participate PT Goal Formulation: With patient Time For Goal Achievement: 12/19/20 Potential to Achieve Goals: Poor    Frequency Min 2X/week   Barriers to discharge        Co-evaluation               AM-PAC PT "6 Clicks" Mobility  Outcome Measure Help needed turning from your back to your side while in a flat bed without using bedrails?: Total Help needed moving from lying on your back to sitting on the side of a flat bed without using bedrails?: Total Help needed moving to and from a bed to a chair (including a wheelchair)?: Total Help needed standing up from a chair using your arms (e.g., wheelchair or bedside chair)?: Total Help needed to walk in hospital room?: Total Help needed climbing 3-5 steps with a railing? : Total 6 Click Score: 6    End of Session   Activity Tolerance: Patient limited by lethargy Patient left: in bed;with call bell/phone within reach;with bed alarm set;Other (comment) (in chair position) Nurse Communication: Mobility status;Need for lift equipment PT Visit Diagnosis: Unsteadiness on feet (R26.81);Muscle weakness (generalized) (M62.81);Hemiplegia and hemiparesis Hemiplegia - Right/Left: Right Hemiplegia - dominant/non-dominant: Dominant  Time: 9201-0071 PT Time Calculation (min) (ACUTE ONLY): 12 min   Charges:   PT Evaluation $PT Eval Moderate Complexity: 1 [address], SPT   Verl Blalock 12/05/2020, 12:35 PM

## 2020-12-06 DIAGNOSIS — K5792 Diverticulitis of intestine, part unspecified, without perforation or abscess without bleeding: Secondary | ICD-10-CM | POA: Diagnosis not present

## 2020-12-06 DIAGNOSIS — B370 Candidal stomatitis: Secondary | ICD-10-CM | POA: Diagnosis not present

## 2020-12-06 DIAGNOSIS — E440 Moderate protein-calorie malnutrition: Secondary | ICD-10-CM | POA: Insufficient documentation

## 2020-12-06 DIAGNOSIS — I639 Cerebral infarction, unspecified: Secondary | ICD-10-CM | POA: Diagnosis not present

## 2020-12-06 DIAGNOSIS — R638 Other symptoms and signs concerning food and fluid intake: Secondary | ICD-10-CM

## 2020-12-06 LAB — BASIC METABOLIC PANEL
Anion gap: 12 (ref 5–15)
BUN: 14 mg/dL (ref 8–23)
CO2: 21 mmol/L — ABNORMAL LOW (ref 22–32)
Calcium: 9.1 mg/dL (ref 8.9–10.3)
Chloride: 106 mmol/L (ref 98–111)
Creatinine, Ser: 0.91 mg/dL (ref 0.61–1.24)
GFR, Estimated: >60 mL/min (ref >60)
Glucose, Bld: 132 mg/dL — ABNORMAL HIGH (ref 70–99)
Potassium: 3.4 mmol/L — ABNORMAL LOW (ref 3.5–5.1)
Sodium: 139 mmol/L (ref 135–145)

## 2020-12-06 LAB — CBC
HCT: 37.2 % — ABNORMAL LOW (ref 39.0–52.0)
Hemoglobin: 12.1 g/dL — ABNORMAL LOW (ref 13.0–17.0)
MCH: 28.3 pg (ref 26.0–34.0)
MCHC: 32.5 g/dL (ref 30.0–36.0)
MCV: 87.1 fL (ref 80.0–100.0)
Platelets: 291 10*3/uL (ref 150–400)
RBC: 4.27 MIL/uL (ref 4.22–5.81)
RDW: 14.8 % (ref 11.5–15.5)
WBC: 6.1 10*3/uL (ref 4.0–10.5)
nRBC: 0 % (ref 0.0–0.2)

## 2020-12-06 LAB — HEPATIC FUNCTION PANEL
ALT: 149 U/L — ABNORMAL HIGH (ref 0–44)
AST: 117 U/L — ABNORMAL HIGH (ref 15–41)
Albumin: 3 g/dL — ABNORMAL LOW (ref 3.5–5.0)
Alkaline Phosphatase: 57 U/L (ref 38–126)
Bilirubin, Direct: 0.1 mg/dL (ref 0.0–0.2)
Indirect Bilirubin: 0.6 mg/dL (ref 0.3–0.9)
Total Bilirubin: 0.7 mg/dL (ref 0.3–1.2)
Total Protein: 7 g/dL (ref 6.5–8.1)

## 2020-12-06 LAB — CULTURE, BLOOD (SINGLE): Special Requests: ADEQUATE

## 2020-12-06 LAB — GLUCOSE, CAPILLARY
Glucose-Capillary: 135 mg/dL — ABNORMAL HIGH (ref 70–99)
Glucose-Capillary: 187 mg/dL — ABNORMAL HIGH (ref 70–99)
Glucose-Capillary: 161 mg/dL — ABNORMAL HIGH (ref 70–99)
Glucose-Capillary: 163 mg/dL — ABNORMAL HIGH (ref 70–99)

## 2020-12-06 MED ORDER — AMOXICILLIN-POT CLAVULANATE 875-125 MG PO TABS
1.0000 | ORAL_TABLET | Freq: Two times a day (BID) | ORAL | Status: AC
  Administered 2020-12-06 – 2020-12-11 (×11): 1 via ORAL
  Filled 2020-12-06 (×11): qty 1

## 2020-12-06 NOTE — Progress Notes (Signed)
Patient ID: Raymond Kerr, male   DOB: 10-Jan-1956, 65 y.o.   MRN: [id number] [facility] PROGRESS NOTE  Naksh Radi ELF:[id number] DOB: 07-31-1955 DOA: 12/03/2020 PCP: Barbette Reichmann, MD  HPI/Subjective: Patient's daughter concerned about his intake and whether he needs a feeding tube.  When I spoke with the patient this morning the patient shook his head no to a feeding tube and he is interested in eating.  No abdominal pain.  Called back for a positive blood culture which is likely contaminant.  Being treated for diverticulitis.  Objective: Vitals:   12/06/20 0718 12/06/20 1113  BP: 135/74 139/74  Pulse: 88 95  Resp: 16 16  Temp: 98.9 F (37.2 C) 98.7 F (37.1 C)  SpO2: 100% 100%    Intake/Output Summary (Last 24 hours) at 12/06/2020 1559 Last data filed at 12/06/2020 1300 Gross per 24 hour  Intake 1330 ml  Output 450 ml  Net 880 ml   Filed Weights   12/03/20 1955  Weight: 102.7 kg    ROS: Review of Systems  Respiratory:  Negative for shortness of breath.   Cardiovascular:  Negative for chest pain.  Gastrointestinal:  Negative for abdominal pain, nausea and vomiting.  Exam: Physical Exam HENT:     Head: Normocephalic.     Mouth/Throat:     Pharynx: No oropharyngeal exudate.  Eyes:     Conjunctiva/sclera: Conjunctivae normal.     Comments: Left eye lid drooping.  Cardiovascular:     Rate and Rhythm: Normal rate and regular rhythm.     Heart sounds: Normal heart sounds, S1 normal and S2 normal.  Pulmonary:     Breath sounds: No decreased breath sounds, wheezing, rhonchi or rales.  Abdominal:     Palpations: Abdomen is soft.     Tenderness: There is no abdominal tenderness.  Musculoskeletal:     Right ankle: Swelling present.     Left ankle: Swelling present.  Neurological:     Mental Status: He is alert.     Comments: Tries to talk and shakes his head yes or no to questions.      Scheduled Meds:  amLODipine  10 mg Oral Daily    amoxicillin-clavulanate  1 tablet Oral Q12H   aspirin  81 mg Oral Daily   B-complex with vitamin C  1 tablet Oral Daily   chlorhexidine  15 mL Mouth Rinse BID   enoxaparin (LOVENOX) injection  50 mg Subcutaneous QHS   feeding supplement  237 mL Oral TID BM   insulin aspart  0-15 Units Subcutaneous TID WC   insulin aspart  0-5 Units Subcutaneous QHS   insulin glargine-yfgn  8 Units Subcutaneous QHS   mouth rinse  15 mL Mouth Rinse q12n4p   methylphenidate  10 mg Oral BID WC   pantoprazole  40 mg Oral QPM   tamsulosin  0.4 mg Oral QPC supper   Continuous Infusions:  fluconazole (DIFLUCAN) IV 100 mg (12/06/20 1248)    Assessment/Plan:  Diverticulitis seen on CAT scan of the abdomen pelvis.  Switch Rocephin and Flagyl over to Augmentin for this evening. Bacteremia ruled out and is likely a skin contamination.  Repeat blood cultures are negative.  Discontinue vancomycin. Patient's daughter concerned about calorie intake.  Calorie count undergoing.  There were concerns last hospital admission about possibly placing a PEG tube.  Patient shook his head no to a PEG tube.  Holding Plavix just in case calorie count is poor. Thrush on IV Diflucan Completed nonhemorrhagic CVA with right-sided hemiparesis  and aphasia.  Patient on aspirin.  Holding Plavix and Lipitor at this point.  Holding Plavix secondary to possibility of a PEG.  Holding Lipitor with elevated liver function test. Elevated liver function test.  Holding Lipitor.AST came down from 172 to 117.  ALT coming down from 241 to 149. Type 2 diabetes mellitus with hyperlipidemia.  Holding Lipitor with elevated liver function test.  Holding metformin and glimepiride while here in the hospital on Semglee insulin currently. Essential hypertension on Norvasc BPH on Flomax        Code Status:     Code Status Orders  (From admission, onward)           Start     Ordered   12/03/20 2139  Full code  Continuous        12/03/20 2141            Code Status History     Date Active Date Inactive Code Status Order ID Comments User Context   10/24/2020 1646 11/26/2020 0141 Full Code [id number]  Lynnae Prude Inpatient   10/24/2020 1646 10/24/2020 1646 Full Code [id number]  Lynnae Prude Inpatient   10/06/2020 0233 10/24/2020 1644 Full Code [id number]  Mansy, Vernetta Honey, MD ED      Family Communication: Updated daughter on the phone Disposition Plan: Status is: Inpatient  Antibiotics: Change antibiotics to Augmentin  Time spent: 27 minutes  Raymond Kerr [employer]

## 2020-12-07 DIAGNOSIS — E440 Moderate protein-calorie malnutrition: Secondary | ICD-10-CM | POA: Diagnosis not present

## 2020-12-07 DIAGNOSIS — K5792 Diverticulitis of intestine, part unspecified, without perforation or abscess without bleeding: Secondary | ICD-10-CM | POA: Diagnosis not present

## 2020-12-07 DIAGNOSIS — R638 Other symptoms and signs concerning food and fluid intake: Secondary | ICD-10-CM | POA: Diagnosis not present

## 2020-12-07 DIAGNOSIS — I639 Cerebral infarction, unspecified: Secondary | ICD-10-CM | POA: Diagnosis not present

## 2020-12-07 LAB — GLUCOSE, CAPILLARY
Glucose-Capillary: 204 mg/dL — ABNORMAL HIGH (ref 70–99)
Glucose-Capillary: 193 mg/dL — ABNORMAL HIGH (ref 70–99)
Glucose-Capillary: 161 mg/dL — ABNORMAL HIGH (ref 70–99)
Glucose-Capillary: 199 mg/dL — ABNORMAL HIGH (ref 70–99)

## 2020-12-07 MED ORDER — FLUCONAZOLE 100 MG PO TABS
100.0000 mg | ORAL_TABLET | Freq: Every day | ORAL | Status: DC
  Administered 2020-12-07 – 2020-12-11 (×5): 100 mg via ORAL
  Filled 2020-12-07 (×5): qty 1

## 2020-12-07 NOTE — Progress Notes (Signed)
Patient ID: Hernando Reali, male   DOB: 08/19/1955, 65 y.o.   MRN: [id number] [facility] PROGRESS NOTE  Jeet Shough MEQ:[id number] DOB: 07/07/1955 DOA: 12/03/2020 PCP: Barbette Reichmann, MD  HPI/Subjective: Looking at some of his meal intake he looks like he ate 1 meal very good but other meals not as good.  Dietitian to review calorie count.  Patient complains of no abdominal pain.  No nausea or vomiting.  Initially called back with positive blood culture.  Treated for diverticulitis.  Objective: Vitals:   12/07/20 0724 12/07/20 1124  BP: 130/79 (!) 150/80  Pulse: 90 90  Resp: 20 16  Temp: 98 F (36.7 C) 98.5 F (36.9 C)  SpO2: 99% 99%    Intake/Output Summary (Last 24 hours) at 12/07/2020 1448 Last data filed at 12/07/2020 1300 Gross per 24 hour  Intake 120 ml  Output 500 ml  Net -380 ml   Filed Weights   12/03/20 1955  Weight: 102.7 kg    ROS: Review of Systems  Respiratory:  Negative for shortness of breath.   Cardiovascular:  Negative for chest pain.  Gastrointestinal:  Negative for abdominal pain.  Exam: Physical Exam HENT:     Head: Normocephalic.     Mouth/Throat:     Pharynx: No oropharyngeal exudate.  Eyes:     General: Lids are normal.     Conjunctiva/sclera: Conjunctivae normal.  Cardiovascular:     Rate and Rhythm: Normal rate and regular rhythm.     Heart sounds: Normal heart sounds, S1 normal and S2 normal.  Pulmonary:     Breath sounds: Normal breath sounds. No decreased breath sounds, wheezing, rhonchi or rales.  Abdominal:     Palpations: Abdomen is soft.     Tenderness: There is no abdominal tenderness.  Musculoskeletal:     Right lower leg: Swelling present.     Left lower leg: Swelling present.  Skin:    General: Skin is warm.     Findings: No rash.  Neurological:     Mental Status: He is alert.     Comments: Right-sided paralysis, aphasia.  Able to say some words but difficult to understand.  Able to shake his head yes or no  to questions.      Scheduled Meds:  amLODipine  10 mg Oral Daily   amoxicillin-clavulanate  1 tablet Oral Q12H   aspirin  81 mg Oral Daily   B-complex with vitamin C  1 tablet Oral Daily   chlorhexidine  15 mL Mouth Rinse BID   enoxaparin (LOVENOX) injection  50 mg Subcutaneous QHS   feeding supplement  237 mL Oral TID BM   fluconazole  100 mg Oral Daily   insulin aspart  0-15 Units Subcutaneous TID WC   insulin aspart  0-5 Units Subcutaneous QHS   insulin glargine-yfgn  8 Units Subcutaneous QHS   mouth rinse  15 mL Mouth Rinse q12n4p   methylphenidate  10 mg Oral BID WC   pantoprazole  40 mg Oral QPM   tamsulosin  0.4 mg Oral QPC supper   Assessment/Plan:  Decreased oral intake.  Malnutrition of moderate degree. Undergoing calorie count.  Dietitian to review last couple days of meals.  Holding Plavix just in case PEG needed. Diverticulitis on CAT scan of the abdomen pelvis.  Antibiotics switched over to Augmentin for last night. Initial blood culture positive likely skin contamination.  Discontinued vancomycin yesterday. Thrush on IV Diflucan Completed nonhemorrhagic CVA with right-sided hemiparesis and aphasia.  Patient on aspirin.  Holding Plavix and Lipitor at this point.  Holding Plavix just in case a PEG needed.  Holding Lipitor with elevated liver function test. Elevated liver function test.  Holding Lipitor.  Recheck LFTs tomorrow. Type 2 diabetes mellitus with hyperlipidemia.  Holding Lipitor with elevated liver function test.  Holding metformin and glimepiride while here in the hospital.  Currently on Semglee insulin. Essential hypertension.  Continue Norvasc BPH on Flomax     Code Status:     Code Status Orders  (From admission, onward)           Start     Ordered   12/03/20 2139  Full code  Continuous        12/03/20 2141           Code Status History     Date Active Date Inactive Code Status Order ID Comments User Context   10/24/2020 1646  11/26/2020 0141 Full Code [id number]  Lynnae Prude Inpatient   10/24/2020 1646 10/24/2020 1646 Full Code [id number]  Lynnae Prude Inpatient   10/06/2020 0233 10/24/2020 1644 Full Code [id number]  Mansy, Vernetta Honey, MD ED      Family Communication: Spoke with daughter on phone Disposition Plan: Status is: Inpatient.  Patient undergoing a calorie count to see if a PEG tube will be warranted or not.  Antibiotics: Augmentin and Diflucan  Time spent: 27 minutes  Nashea Chumney [employer]

## 2020-12-07 NOTE — Progress Notes (Signed)
PHARMACIST - PHYSICIAN COMMUNICATION  CONCERNING: Antibiotic IV to Oral Route Change Policy  RECOMMENDATION: This patient is receiving fluconazole by the intravenous route.  Based on criteria approved by the Pharmacy and Therapeutics Committee, the antibiotic(s) is/are being converted to the equivalent oral dose form(s).   DESCRIPTION: These criteria include: Patient being treated for a respiratory tract infection, urinary tract infection, cellulitis or clostridium difficile associated diarrhea if on metronidazole The patient is not neutropenic and does not exhibit a GI malabsorption state The patient is eating (either orally or via tube) and/or has been taking other orally administered medications for a least 24 hours The patient is improving clinically and has a Tmax < 100.5  If you have questions about this conversion, please contact the Pharmacy Department   Tressie Ellis  12/07/20

## 2020-12-08 DIAGNOSIS — R627 Adult failure to thrive: Secondary | ICD-10-CM

## 2020-12-08 LAB — CULTURE, BLOOD (ROUTINE X 2)
Culture: NO GROWTH
Culture: NO GROWTH

## 2020-12-08 LAB — COMPREHENSIVE METABOLIC PANEL
ALT: 102 U/L — ABNORMAL HIGH (ref 0–44)
AST: 80 U/L — ABNORMAL HIGH (ref 15–41)
Albumin: 2.8 g/dL — ABNORMAL LOW (ref 3.5–5.0)
Alkaline Phosphatase: 59 U/L (ref 38–126)
Anion gap: 7 (ref 5–15)
BUN: 12 mg/dL (ref 8–23)
CO2: 21 mmol/L — ABNORMAL LOW (ref 22–32)
Calcium: 9.1 mg/dL (ref 8.9–10.3)
Chloride: 109 mmol/L (ref 98–111)
Creatinine, Ser: 0.78 mg/dL (ref 0.61–1.24)
GFR, Estimated: >60 mL/min (ref >60)
Glucose, Bld: 195 mg/dL — ABNORMAL HIGH (ref 70–99)
Potassium: 3.3 mmol/L — ABNORMAL LOW (ref 3.5–5.1)
Sodium: 137 mmol/L (ref 135–145)
Total Bilirubin: 0.6 mg/dL (ref 0.3–1.2)
Total Protein: 6.6 g/dL (ref 6.5–8.1)

## 2020-12-08 LAB — HEMOGLOBIN: Hemoglobin: 11 g/dL — ABNORMAL LOW (ref 13.0–17.0)

## 2020-12-08 LAB — GLUCOSE, CAPILLARY
Glucose-Capillary: 176 mg/dL — ABNORMAL HIGH (ref 70–99)
Glucose-Capillary: 190 mg/dL — ABNORMAL HIGH (ref 70–99)
Glucose-Capillary: 199 mg/dL — ABNORMAL HIGH (ref 70–99)
Glucose-Capillary: 213 mg/dL — ABNORMAL HIGH (ref 70–99)

## 2020-12-08 MED ORDER — POTASSIUM CHLORIDE 20 MEQ PO PACK
40.0000 meq | PACK | Freq: Once | ORAL | Status: AC
  Administered 2020-12-08: 40 meq via ORAL
  Filled 2020-12-08: qty 2

## 2020-12-08 MED ORDER — SENNOSIDES-DOCUSATE SODIUM 8.6-50 MG PO TABS
3.0000 | ORAL_TABLET | Freq: Every day | ORAL | Status: DC
  Administered 2020-12-08 – 2020-12-12 (×5): 3 via ORAL
  Filled 2020-12-08 (×5): qty 3

## 2020-12-08 NOTE — Progress Notes (Signed)
Patient ID: Raymond Kerr, male   DOB: [birth year], 65 y.o.   MRN: [id number] [facility] PROGRESS NOTE  Raymond Kerr [id number]:[id number] DOB: [birth year] DOA: 12/03/2020 PCP: Barbette Reichmann, MD  HPI/Subjective: Dietary recommending a PEG feeding tube to maintain nutritional status.  I asked the patient about a PEG feeding tube and he shook his head no.  No abdominal pain.  Initially admitted with diverticulitis.  Objective: Vitals:   12/08/20 0741 12/08/20 1151  BP: 133/74 (!) 147/78  Pulse: 90 96  Resp: 18 17  Temp: 98 F (36.7 C) 98.5 F (36.9 C)  SpO2: 98% 99%    Intake/Output Summary (Last 24 hours) at 12/08/2020 1528 Last data filed at 12/08/2020 0508 Gross per 24 hour  Intake --  Output 450 ml  Net -450 ml   Filed Weights   12/03/20 1955  Weight: 102.7 kg    ROS: Review of Systems  Respiratory:  Negative for shortness of breath.   Cardiovascular:  Negative for chest pain.  Gastrointestinal:  Negative for abdominal pain.  Exam: Physical Exam HENT:     Head: Normocephalic.     Mouth/Throat:     Pharynx: No oropharyngeal exudate.     Comments: Thrush on tongue Eyes:     General: Lids are normal.     Extraocular Movements: Extraocular movements intact.  Cardiovascular:     Rate and Rhythm: Normal rate and regular rhythm.     Heart sounds: Normal heart sounds, S1 normal and S2 normal.  Pulmonary:     Breath sounds: Normal breath sounds. No decreased breath sounds, wheezing, rhonchi or rales.  Abdominal:     Palpations: Abdomen is soft.     Tenderness: There is no abdominal tenderness.  Musculoskeletal:     Right lower leg: No swelling.     Left lower leg: No swelling.  Skin:    General: Skin is warm.     Findings: No rash.  Neurological:     Mental Status: He is alert.     Comments: Patient shakes his head to yes or no questions.      Scheduled Meds:  amLODipine  10 mg Oral Daily   amoxicillin-clavulanate  1 tablet Oral Q12H   aspirin  81 mg  Oral Daily   B-complex with vitamin C  1 tablet Oral Daily   chlorhexidine  15 mL Mouth Rinse BID   enoxaparin (LOVENOX) injection  50 mg Subcutaneous QHS   feeding supplement  237 mL Oral TID BM   fluconazole  100 mg Oral Daily   insulin aspart  0-15 Units Subcutaneous TID WC   insulin aspart  0-5 Units Subcutaneous QHS   insulin glargine-yfgn  8 Units Subcutaneous QHS   mouth rinse  15 mL Mouth Rinse q12n4p   methylphenidate  10 mg Oral BID WC   pantoprazole  40 mg Oral QPM   tamsulosin  0.4 mg Oral QPC supper    Assessment/Plan:  Decreased oral intake.  Failure to thrive.  Moderate malnutrition.  Case discussed with dietitian and she recommends a PEG for extra nutrition.  Patient unlikely to keep up with his nutritional needs.  Last dose of Plavix was Friday, 12/05/2020.  I asked the patient if he wanted [place] to put a feeding tube in and he shook his head no.  Also shook his head no while daughter was in the room this afternoon.  Reconsult palliative care. Diverticulitis on CAT scan of the abdomen pelvis.  Continue Augmentin. Initial blood culture skin contamination  Thrush on IV Diflucan Completed nonhemorrhagic CVA with right-sided hemiparesis and aphasia.  Continue aspirin.  Holding Plavix and Lipitor.  Lipitor held from elevated liver function test and Plavix held just in case PEG needed. Elevated liver function tests.  Holding Lipitor.  AST trending down to 80 and ALT down to 102. Type 2 diabetes mellitus with hyperlipidemia.  Holding Lipitor with elevated liver function test.  Holding metformin and glimepiride.  Currently on Semglee insulin. Essential hypertension on Norvasc BPH on Flomax Reconsult palliative care     Code Status:     Code Status Orders  (From admission, onward)           Start     Ordered   12/03/20 2139  Full code  Continuous        12/03/20 2141           Code Status History     Date Active Date Inactive Code Status Order ID Comments User  Context   10/24/2020 1646 11/26/2020 0141 Full Code [id number]  Lynnae Prude Inpatient   10/24/2020 1646 10/24/2020 1646 Full Code [id number]  Lynnae Prude Inpatient   10/06/2020 0233 10/24/2020 1644 Full Code [id number]  Mansy, Vernetta Honey, MD ED      Family Communication: Spoke with the patient's daughter on the phone. Disposition Plan: Status is: Inpatient.  We will meet with the family at 15 AM tomorrow at the bedside.  Reconsulted palliative care.  Patient currently shaking his head no to a PEG tube and dietitian stating that he not keeping up with his nutritional needs.  Patient currently a full code.  Consultants: Palliative care  Antibiotics: Augmentin  Time spent: 27 minutes  Kayman Snuffer [employer]

## 2020-12-08 NOTE — TOC Initial Note (Addendum)
Transition of Care [facility] - Initial/Assessment Note    Patient Details  Name: Raymond Kerr MRN: [id number] Date of Birth: 08-Apr-1955  Transition of Care [facility] CM/SW Contact:    Caryn Section, RN Phone Number: 12/08/2020, 9:26 AM  Clinical Narrative:  As [name] daughter, [name], patient has been living with her since his CVA.   She has been checking on patient's medicare/medicaid status.  Patient did not sign up for Medicare prior to 64th birthday and she has been told that patient will not be eligible until December.  Daughter does not currently know about Medicaid status.  Amy Shepherd consulted, as per Ms. Frazier Richards, daughter stated she had started application.  Ms. Frazier Richards stated that she checked patient Medicaid was family planning and attempted to speak with daughter, who refused her services at that time.    As per daughter, patient and daughter unaware that patient needed to sign up for Medicare prior to 64th birthday, and she was told patient would have to wait until 3 months post birthday, and she was not aware that this is open enrollment.  She stated she would check on this and let me know.  Discussed palliative care with daughter and follow up on palliative discussion from Friday, and whether daughter had spoken with palliative.  Daughter did not recall conversation, but then stated she did not have details.  She asked for another meeting with palliative and possibly with Hospice if discussion with palliative indicated the need for Hospice.  Discussed that patient would have a choice of hospice providers, information given to daughter.  Discussed overwhelming feeling and amount of information with daughter.  RNCM provided TOC contact information, suggested chaplain referral when daughter is at bedside and suggested that daughter keep notes about patient's health and discussions with providers to assist.  Daughter [name] was receptive to information.  TOC to follow to discharge for  needs. Addendum:  Daughter [name] would like to speak with palliative and MD tomorrow more about discharge, DNR, feeding tube and outpatient palliative.  Will be here after 10am to discuss.  Dr. Renae Gloss aware and will meet with patient  Expected Discharge Plan: Home w Home Health Services Barriers to Discharge: Continued Medical Work up   Patient Goals and CMS Choice     Choice offered to / list presented to :  (TBD)  Expected Discharge Plan and Services Expected Discharge Plan: Home w Home Health Services In-house Referral: Nutrition, Chaplain, Artist (Nuturition for calorie count, Chaplain for care decisions, Artist for [employer] status) Discharge Planning Services: CM Consult Post Acute Care Choice:  (TBD) Living arrangements for the past 2 months: Single Family Home                   DME Agency:  (TBD)       HH Arranged:  (TBD)          Prior Living Arrangements/Services Living arrangements for the past 2 months: Single Family Home Lives with:: Self, Adult Children Patient language and need for interpreter reviewed:: Yes (NO interpreter required, spoke with daughter, due to paitent's status) Do you feel safe going back to the place where you live?: Yes      Need for Family Participation in Patient Care: Yes (Comment) Care giver support system in place?: Yes (comment) Current home services: DME, Other (comment) (Currently ineligible for HH due to Medicaid potential status) Criminal Activity/Legal Involvement Pertinent to Current Situation/Hospitalization: No - Comment as needed  Activities of Daily Living Home  Assistive Devices/Equipment: None ADL Screening (condition at time of admission) Patient's cognitive ability adequate to safely complete daily activities?: No Is the patient deaf or have difficulty hearing?: No Does the patient have difficulty seeing, even when wearing glasses/contacts?: No Does the patient have difficulty  concentrating, remembering, or making decisions?: Yes Patient able to express need for assistance with ADLs?: No Does the patient have difficulty dressing or bathing?: Yes Independently performs ADLs?: No Communication: Needs assistance Is this a change from baseline?: Pre-admission baseline Dressing (OT): Dependent Is this a change from baseline?: Pre-admission baseline Grooming: Dependent Is this a change from baseline?: Pre-admission baseline Feeding: Dependent Is this a change from baseline?: Pre-admission baseline Bathing: Dependent Is this a change from baseline?: Pre-admission baseline In/Out Bed: Dependent Is this a change from baseline?: Pre-admission baseline Walks in Home: Dependent Is this a change from baseline?: Pre-admission baseline Does the patient have difficulty walking or climbing stairs?: Yes Weakness of Legs: Both Weakness of Arms/Hands: Both  Permission Sought/Granted Permission sought to share information with : Case Manager Permission granted to share information with : Yes, Verbal Permission Granted              Emotional Assessment Appearance:: Appears stated age Attitude/Demeanor/Rapport:  (Spoke to daughter due to patient status) Affect (typically observed):  (Spoke to daughter due to patient status) Orientation: :  (n/a) Alcohol / Substance Use: Not Applicable Psych Involvement: No (comment)  Admission diagnosis:  Bacteremia [R78.81] Diverticulitis [K57.92] Bacteremia due to Staphylococcus [R78.81, B95.8] Patient Active Problem List   Diagnosis Date Noted   Malnutrition of moderate degree 12/06/2020   Decreased oral intake    Elevated liver function tests    Thrush    Benign prostatic hyperplasia without lower urinary tract symptoms    Bacteremia due to Staphylococcus 12/03/2020   Aphasia as late effect of cerebrovascular accident 12/03/2020   Diverticulitis 12/03/2020   Essential hypertension    Spastic 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 disease (HCC)    Uncontrolled type 2 diabetes mellitus with hyperglycemia (HCC)    Left thalamic infarction (HCC) 10/24/2020   Hypotension 10/19/2020   Hypokalemia 10/17/2020   Hypomagnesemia 10/17/2020   Hypophosphatemia 10/17/2020   Pressure injury of skin 10/16/2020   Completed stroke (HCC)    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 [place] [address] Phone: [phone] Fax: [phone]  [facility] [address] [place] [address] Phone: [phone] Fax: [phone]     Social Determinants of Health (SDOH) Interventions    Readmission Risk Interventions No flowsheet data found.

## 2020-12-08 NOTE — Progress Notes (Signed)
[facility] CLINIC INFECTIOUS DISEASE PROGRESS NOTE Date of Admission:  12/03/2020     ID: Raymond Kerr is a 65 y.o. male with  Bacteremia Principal Problem:   Bacteremia due to Staphylococcus Active Problems:   Type 2 diabetes mellitus with hyperlipidemia (HCC)   Completed stroke (HCC)   Essential hypertension   Spastic hemiparesis of right dominant side as late effect of cerebrovascular disease (HCC)   Aphasia as late effect of cerebrovascular accident   Diverticulitis   Elevated liver function tests   Malnutrition of moderate degree   Decreased oral intake   Subjective: No fevers, no new issues  ROS unable to obtain  Medications:  Antibiotics Given (last 72 hours)     Date/Time Action Medication Dose Rate   12/05/20 2218 New Bag/Given   vancomycin (VANCOREADY) IVPB 750 mg/150 mL 750 mg 150 mL/hr   12/06/20 0459 New Bag/Given   cefTRIAXone (ROCEPHIN) 2 g in sodium chloride 0.9 % 100 mL IVPB 2 g 200 mL/hr   12/06/20 0948 Given   metroNIDAZOLE (FLAGYL) tablet 500 mg 500 mg    12/06/20 0957 New Bag/Given   vancomycin (VANCOREADY) IVPB 750 mg/150 mL 750 mg 150 mL/hr   12/06/20 2030 Given  [clustered care]   amoxicillin-clavulanate (AUGMENTIN) 875-125 MG per tablet 1 tablet 1 tablet    12/07/20 0850 Given   amoxicillin-clavulanate (AUGMENTIN) 875-125 MG per tablet 1 tablet 1 tablet    12/07/20 2045 Given   amoxicillin-clavulanate (AUGMENTIN) 875-125 MG per tablet 1 tablet 1 tablet    12/08/20 4967 Given   amoxicillin-clavulanate (AUGMENTIN) 875-125 MG per tablet 1 tablet 1 tablet        amLODipine  10 mg Oral Daily   amoxicillin-clavulanate  1 tablet Oral Q12H   aspirin  81 mg Oral Daily   B-complex with vitamin C  1 tablet Oral Daily   chlorhexidine  15 mL Mouth Rinse BID   enoxaparin (LOVENOX) injection  50 mg Subcutaneous QHS   feeding supplement  237 mL Oral TID BM   fluconazole  100 mg Oral Daily   insulin aspart  0-15 Units Subcutaneous TID WC   insulin aspart   0-5 Units Subcutaneous QHS   insulin glargine-yfgn  8 Units Subcutaneous QHS   mouth rinse  15 mL Mouth Rinse q12n4p   methylphenidate  10 mg Oral BID WC   pantoprazole  40 mg Oral QPM   tamsulosin  0.4 mg Oral QPC supper    Objective: Vital signs in last 24 hours: Temp:  [98 F (36.7 C)-98.7 F (37.1 C)] 98.5 F (36.9 C) (10/17 1151) Pulse Rate:  [90-98] 96 (10/17 1151) Resp:  [17-18] 17 (10/17 1151) BP: (133-147)/(74-86) 147/78 (10/17 1151) SpO2:  [97 %-99 %] 99 % (10/17 1151) Constitutional: aphasic, tries to verbalize HENT: anicteric Mouth/Throat: mild facial droop Cardiovascular: Normal rate, regular rhythm and normal heart sounds. Exam reveals no gallop and no friction rub.  No murmur heard.  Pulmonary/Chest: Effort normal and breath sounds normal. No respiratory distress. He has no wheezes.  Abdominal: Soft. Bowel sounds are normal. He exhibits no distension. There is no tenderness.  Lymphadenopathy:  He has no cervical adenopathy.  Neurological: aphasic, L hemiparesisi Skin: Skin is warm and dry. No rash noted. No erythema.  Psychiatric: aphasix, unable to assess  Lab Results Recent Labs    12/06/20 0657 12/08/20 0401  WBC 6.1  --   HGB 12.1* 11.0*  HCT 37.2*  --   NA 139 137  K 3.4* 3.3*  CL 106 109  CO2 21* 21*  BUN 14 12  CREATININE 0.91 0.78    Microbiology: Results for orders placed or performed during the hospital encounter of 12/03/20  Blood culture (routine x 2)     Status: None   Collection Time: 12/03/20  8:06 PM   Specimen: BLOOD RIGHT FOREARM  Result Value Ref Range Status   Specimen Description BLOOD RIGHT FOREARM  Final   Special Requests   Final    BOTTLES DRAWN AEROBIC AND ANAEROBIC Blood Culture results may not be optimal due to an excessive volume of blood received in culture bottles   Culture   Final    NO GROWTH 5 DAYS Performed at [facility], [address] [place], [place] [address]    Report Status 12/08/2020  FINAL  Final  Resp Panel by RT-PCR (Flu A&B, Covid) Nasopharyngeal Swab     Status: None   Collection Time: 12/03/20  8:45 PM   Specimen: Nasopharyngeal Swab; Nasopharyngeal(NP) swabs in vial transport medium  Result Value Ref Range Status   SARS Coronavirus 2 by RT PCR NEGATIVE NEGATIVE Final    Comment: (NOTE) SARS-CoV-2 target nucleic acids are NOT DETECTED.  The SARS-CoV-2 RNA is generally detectable in upper respiratory specimens during the acute phase of infection. The lowest concentration of SARS-CoV-2 viral copies this assay can detect is 138 copies/mL. A negative result does not preclude SARS-Cov-2 infection and should not be used as the sole basis for treatment or other patient management decisions. A negative result may occur with  improper specimen collection/handling, submission of specimen other than nasopharyngeal swab, presence of viral mutation(s) within the areas targeted by this assay, and inadequate number of viral copies(<138 copies/mL). A negative result must be combined with clinical observations, patient history, and epidemiological information. The expected result is Negative.  Fact Sheet for Patients:  BloggerCourse.com  Fact Sheet for Healthcare Providers:  SeriousBroker.it  This test is no t yet approved or cleared by the [place] FDA and  has been authorized for detection and/or diagnosis of SARS-CoV-2 by FDA under an Emergency Use Authorization (EUA). This EUA will remain  in effect (meaning this test can be used) for the duration of the COVID-19 declaration under Section 564(b)(1) of the Act, 21 U.S.C.section 360bbb-3(b)(1), unless the authorization is terminated  or revoked sooner.       Influenza A by PCR NEGATIVE NEGATIVE Final   Influenza B by PCR NEGATIVE NEGATIVE Final    Comment: (NOTE) The Xpert Xpress SARS-CoV-2/FLU/RSV plus assay is intended as an aid in the diagnosis of influenza  from Nasopharyngeal swab specimens and should not be used as a sole basis for treatment. Nasal washings and aspirates are unacceptable for Xpert Xpress SARS-CoV-2/FLU/RSV testing.  Fact Sheet for Patients: BloggerCourse.com  Fact Sheet for Healthcare Providers: SeriousBroker.it  This test is not yet approved or cleared by the [place] FDA and has been authorized for detection and/or diagnosis of SARS-CoV-2 by FDA under an Emergency Use Authorization (EUA). This EUA will remain in effect (meaning this test can be used) for the duration of the COVID-19 declaration under Section 564(b)(1) of the Act, 21 U.S.C. section 360bbb-3(b)(1), unless the authorization is terminated or revoked.  Performed at [facility], [address] [place], [place] [address]   MRSA Next Gen by PCR, Nasal     Status: None   Collection Time: 12/03/20  8:51 PM   Specimen: Nasal Mucosa; Nasal Swab  Result Value Ref Range Status  MRSA by PCR Next Gen NOT DETECTED NOT DETECTED Final    Comment: (NOTE) The GeneXpert MRSA Assay (FDA approved for NASAL specimens only), is one component of a comprehensive MRSA colonization surveillance program. It is not intended to diagnose MRSA infection nor to guide or monitor treatment for MRSA infections. Test performance is not FDA approved in patients less than 50 years old. Performed at [facility], [address] [place], [place] [address]   Blood culture (routine x 2)     Status: None   Collection Time: 12/03/20  9:00 PM   Specimen: BLOOD  Result Value Ref Range Status   Specimen Description BLOOD LEFT ASSIST CONTROL  Final   Special Requests   Final    BOTTLES DRAWN AEROBIC AND ANAEROBIC Blood Culture results may not be optimal due to an inadequate volume of blood received in culture bottles   Culture   Final    NO GROWTH 5 DAYS Performed at [facility], [address],  [place], [place] [address]    Report Status 12/08/2020 FINAL  Final    Studies/Results: No results found.  Assessment/Plan: Raymond Kerr is a 65 y.o. male with recent CVA now largely bedbound with recent ED visit for AMS with CT showing possible early diverticulitis dced on oral abx now called back to ED due to [facility] + staph species. I suspect contaminant but will await cx.  No fevers, wbc nml   1/17 fu bcx neg. Clinically stable  Recommendations Finish treatment for the diverticulitis with 10 days from Oct 11 ED visit then stop abx and monitor  ID will sign off. Thank you very much for the consult. Mick Sell   12/08/2020, 1:55 PM

## 2020-12-08 NOTE — Progress Notes (Signed)
Physical Therapy Treatment Patient Details Name: Raymond Kerr MRN: [id number] DOB: [birth year] Today's Date: 12/08/2020   History of Present Illness Cameron Schwinn is a 65 y.o. 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    PT Comments    Co-tx with OT.  1 unit billed each.  Pt inc BM and bladder upon arrival.  Care provided and pt encouraged to participate in care as much as he is able.  He is able to maintain side lying for pericare with 1 UE on rail and min a x 1 for an extended period of time.   Encouraged pt assist with bed mobility to increase strength and decrease caregiver burden.   Recommendations for follow up therapy are one component of a multi-disciplinary discharge planning process, led by the attending physician.  Recommendations may be updated based on patient status, additional functional criteria and insurance authorization.  Follow Up Recommendations  Home health PT;Supervision/Assistance - 24 hour     Equipment Recommendations  None recommended by PT    Recommendations for Other Services       Precautions / Restrictions Precautions Precautions: Fall Precaution Comments: R hemi, expressive difficulties Restrictions Weight Bearing Restrictions: No     Mobility  Bed Mobility Overal bed mobility: Needs Assistance Bed Mobility: Rolling Rolling: Max assist;Total assist         General bed mobility comments: Max A to Total A for rolling (easier to roll towards R side using LUE to reach and hold onto bed rail)    Transfers                    Ambulation/Gait                 Stairs             Wheelchair Mobility     Modified Rankin (Stroke Patients Only)       Balance                                            Cognition Arousal/Alertness: Awake/alert Behavior During Therapy: Flat affect Overall Cognitive Status: Difficult to assess                                 General Comments: expressive deficits impeding full assessment of cognition today, but is able to follow simple commands with increased processing time, able to nod/shake head to simple yes/no questions appropriately      Exercises Other Exercises Other Exercises: rolling and bathing due to inc BM and bladder.  encouraged pt to particiapte in care as able.    General Comments        Pertinent Vitals/Pain Pain Assessment: Faces Faces Pain Scale: Hurts even more Pain Location: winces with attempts to move RUE Pain Descriptors / Indicators: Grimacing Pain Intervention(s): Limited activity within patient's tolerance;Monitored during session;Repositioned    Home Living                      Prior Function  PT Goals (current goals can now be found in the care plan section) Acute Rehab PT Goals Patient Stated Goal: unable to participate Progress towards PT goals: Progressing toward goals    Frequency    Min 2X/week      PT Plan Current plan remains appropriate    Co-evaluation PT/OT/SLP Co-Evaluation/Treatment: Yes Reason for Co-Treatment: To address functional/ADL transfers PT goals addressed during session: Mobility/safety with mobility OT goals addressed during session: ADL's and self-care      AM-PAC PT "6 Clicks" Mobility   Outcome Measure  Help needed turning from your back to your side while in a flat bed without using bedrails?: Total Help needed moving from lying on your back to sitting on the side of a flat bed without using bedrails?: Total Help needed moving to and from a bed to a chair (including a wheelchair)?: Total Help needed standing up  from a chair using your arms (e.g., wheelchair or bedside chair)?: Total Help needed to walk in hospital room?: Total Help needed climbing 3-5 steps with a railing? : Total 6 Click Score: 6    End of Session   Activity Tolerance: Patient tolerated treatment well;Patient limited by fatigue Patient left: in bed;with call bell/phone within reach;with bed alarm set;Other (comment) Nurse Communication: Mobility status;Need for lift equipment PT Visit Diagnosis: Unsteadiness on feet (R26.81);Muscle weakness (generalized) (M62.81);Hemiplegia and hemiparesis     Time: 0116-0140 PT Time Calculation (min) (ACUTE ONLY): 24 min  Charges:  $Therapeutic Activity: 8-22 mins                    Danielle Dess, PTA 12/08/20, 2:12 PM

## 2020-12-08 NOTE — Progress Notes (Signed)
Calorie Count Note  48-hour calorie count ordered 10/14. See results below for breakdown of nutrition consumed each day. Based on results of calorie count, pt's intake is poor and inadequate to meet needs. Meeting <50% of estimated needs consistently. However - did note that oral supplements were sometimes documented as given but were not documented as consumed. Unsure if pt is consistently drinking.    Diet: DYS 3, thin liquids Supplements: Ensure Enlive po TID  INTERVENTION:  - Continue current diet as ordered, encourage PO intake Nursing to assist with set-up and feeding pt - Continue MVI - Ensure Enlive po TID, each supplement provides 350 kcal and 20 grams of protein   Day 1: 10/14 Lunch: patient refused meal, 0% 10/14 Dinner: 130 kcal, 1 g of protein 10/15 Breakfast: 678 kcal, 19g of protein Supplements: none documented as consumed  Total intake Day 1: 808 kcal (37% of minimum estimated needs)  20 protein (20% of minimum estimated needs)  Day 2: 10/15 Lunch: 117 kcal, 11g of protein 10/15 Dinner: no documentation  10/16 Breakfast: 222 kcal, 7g of protein 10/16 Lunch: patient refused, 0% Supplements: 1/2 ensure (175 kcal, 10g of protein), 1 chocolate pudding (110kcal, 1g of protein)  10/17 Supplements: 1 ensure enlive 350 kcal, 20g of protein  Total intake Day 2: 974 kcal (44% of minimum estimated needs)  49 protein (45% of minimum estimated needs)  NUTRITION DIAGNOSIS:  Moderate Malnutrition (in the context of chronic illness (CVA with residual deficits)) related to dysphagia (altered mental status) as evidenced by mild muscle depletion, percent weight loss (6.6% x 1 month).   GOAL:  Patient will meet greater than or equal to 90% of their needs   Greig Castilla, RD, LDN Clinical Dietitian RD pager # available in [facility]  After hours/weekend pager # available in [facility]

## 2020-12-08 NOTE — Progress Notes (Signed)
Occupational Therapy Treatment Patient Details Name: Raymond Kerr MRN: [id number] DOB: [birth year] Today's Date: 12/08/2020   History of present illness Kaiser Belluomini is a 65 y.o. 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   OT comments  Pt seen for co-tx with PT today. Pt noted to have had loose BM and male purewick appeared to have malfunctioned, as there was a large amount of urine in the bed, too. Pt instructed in log rolling and required VC from PTA for L foot placement and L hand to reach for and maintain grip on bed rail. Pt required MAX A to Total A for log rolling (easier to R side), MIN-MOD A to maintain sidelying, TOTAL A for pericare, LB bathing and linens change. Pt able to follow simple commands with increased processing time. Repositioned back in bed. Pt continues to benefit from skilled OT services. Would benefit from lift training with family to help improve safety and caregiver burden at home for ADL and transfers.    Recommendations for follow up therapy are one component of a multi-disciplinary discharge planning process, led by the attending physician.  Recommendations may be updated based on patient status, additional functional criteria and insurance authorization.    Follow Up Recommendations  Home health OT    Equipment Recommendations  Other (comment) (hoyer lift, power chair)    Recommendations for Other Services      Precautions / Restrictions Precautions Precautions: Fall Precaution Comments: R hemi, expressive difficulties Restrictions Weight Bearing Restrictions: No       Mobility Bed Mobility Overal bed mobility: Needs Assistance Bed Mobility:  Rolling Rolling: Max assist;Total assist         General bed mobility comments: Max A to Total A for rolling (easier to roll towards R side using LUE to reach and hold onto bed rail)    Transfers                      Balance                                           ADL either performed or assessed with clinical judgement   ADL Overall ADL's : Needs assistance/impaired             Lower Body Bathing: Bed level;Total assistance Lower Body Bathing Details (indicate cue type and reason): PTA assisted pt in maintaining sidelying, required TOTAL A for LB bathing Upper Body Dressing : Bed level;Maximal assistance Upper Body Dressing Details (indicate cue type and reason): unable to raise RUE, painful to move, is able to assist partially in getting LUE into sleeve of gown, but MAX A overall         Toileting- Clothing Manipulation and Hygiene: Total assistance;Bed level Toileting - Clothing Manipulation Details (indicate cue type and reason): PTA assisted pt in maintaining sidelying, required TOTAL A for perihygiene             Vision       Perception     Praxis      Cognition Arousal/Alertness: Awake/alert Behavior During Therapy: Flat affect  Overall Cognitive Status: Difficult to assess                                 General Comments: expressive deficits impeding full assessment of cognition today, but is able to follow simple commands with increased processing time, able to nod/shake head to simple yes/no questions appropriately        Exercises     Shoulder Instructions       General Comments      Pertinent Vitals/ Pain       Pain Assessment: Faces Faces Pain Scale: Hurts a little bit Pain Location: winces with attempts to move RUE Pain Descriptors / Indicators: Grimacing Pain Intervention(s): Limited activity within patient's tolerance;Monitored during session;Repositioned  Home Living                                           Prior Functioning/Environment              Frequency  Min 1X/week        Progress Toward Goals  OT Goals(current goals can now be found in the care plan section)  Progress towards OT goals: Progressing toward goals  Acute Rehab OT Goals Patient Stated Goal: unable to participate OT Goal Formulation: Patient unable to participate in goal setting Time For Goal Achievement: 12/19/20 Potential to Achieve Goals: Fair  Plan Discharge plan remains appropriate;Frequency remains appropriate    Co-evaluation    PT/OT/SLP Co-Evaluation/Treatment: Yes Reason for Co-Treatment: For patient/therapist safety;To address functional/ADL transfers PT goals addressed during session: Mobility/safety with mobility;Balance OT goals addressed during session: ADL's and self-care      AM-PAC OT "6 Clicks" Daily Activity     Outcome Measure   Help from another person eating meals?: A Little Help from another person taking care of personal grooming?: A Little Help from another person toileting, which includes using toliet, bedpan, or urinal?: Total Help from another person bathing (including washing, rinsing, drying)?: A Lot Help from another person to put on and taking off regular upper body clothing?: A Lot Help from another person to put on and taking off regular lower body clothing?: Total 6 Click Score: 12    End of Session    OT Visit Diagnosis: Muscle weakness (generalized) (M62.81);Other symptoms and signs involving the nervous system (R29.898);Hemiplegia and hemiparesis Hemiplegia - Right/Left: Right Hemiplegia - dominant/non-dominant: Dominant Hemiplegia - caused by: Cerebral infarction   Activity Tolerance Patient tolerated treatment well   Patient Left in bed;with call bell/phone within reach;with bed alarm set   Nurse Communication Other (comment) (nurse tech - suction for male purewick wasn't working but then started back up  again)        Time: 1317-1340 OT Time Calculation (min): 23 min  Charges: OT General Charges $OT Visit: 1 Visit OT Treatments $Self Care/Home Management : 8-22 mins  Arman Filter., MPH, MS, OTR/L ascom [phone] 12/08/20, 1:54 PM

## 2020-12-09 ENCOUNTER — Inpatient Hospital Stay: Payer: Medicaid Other

## 2020-12-09 LAB — GLUCOSE, CAPILLARY
Glucose-Capillary: 211 mg/dL — ABNORMAL HIGH (ref 70–99)
Glucose-Capillary: 201 mg/dL — ABNORMAL HIGH (ref 70–99)
Glucose-Capillary: 151 mg/dL — ABNORMAL HIGH (ref 70–99)
Glucose-Capillary: 181 mg/dL — ABNORMAL HIGH (ref 70–99)

## 2020-12-09 MED ORDER — ENOXAPARIN SODIUM 40 MG/0.4ML IJ SOSY
40.0000 mg | PREFILLED_SYRINGE | INTRAMUSCULAR | Status: DC
  Administered 2020-12-09 – 2020-12-12 (×4): 40 mg via SUBCUTANEOUS
  Filled 2020-12-09 (×4): qty 0.4

## 2020-12-09 NOTE — TOC Progression Note (Signed)
Transition of Care [facility]) - Progression Note    Patient Details  Name: Stevon Gough MRN: [id number] Date of Birth: [birth year]  Transition of [facility]) CM/SW Contact  Caryn Section, RN Phone Number: 12/09/2020, 12:34 PM  Clinical Narrative:   Family had conference with hospitalist, they are planning to go ahead with PEG tube, Dr. To schedule.  Family awaiting meeting with palliative.  TOC to follow to discharge.    Expected Discharge Plan: Home w Home Health Services Barriers to Discharge: Continued Medical Work up  Expected Discharge Plan and Services Expected Discharge Plan: Home w Home Health Services In-house Referral: Nutrition, Chaplain, Artist (Nuturition for calorie count, Chaplain for care decisions, financial counselor for mediciad status) Discharge Planning Services: CM Consult Post Acute Care Choice:  (TBD) Living arrangements for the past 2 months: Single Family Home                   DME Agency:  (TBD)       HH Arranged:  (TBD)           Social Determinants of Health (SDOH) Interventions    Readmission Risk Interventions No flowsheet data found.

## 2020-12-09 NOTE — Consult Note (Signed)
Chief Complaint: Patient was seen in consultation today for gastrostomy tube placement  Referring Physician(s): Loletha Grayer, MD  Patient Status: [facility] - In-pt  History of Present Illness: Raymond Kerr is a 65 y.o. male with history of stroke with subsequent right hemiparesis, aphasia, and dysphagia, discharged from rehabilitation in September 2022, currently admitted since 12/03/20 with diverticulitis and bacteremia.  Since admission, he has had very poor oral intake, not meeting caloric goals set by Nutrition.  There has been extensive evaluation for dysphagia, overall nutrition, and indication for percutaneous supplemental nutrition.  SLP evaluation on 10/14 was significant for mild risk of aspiration, and a dysphagia 3 diet was prescribed.  Nutritional Management performed a 48 hour calorie count which diagnosed moderate malnutrition < 50 % of estimated caloric needs.  Palliative Care has discussed possibility of gastrostomy with the patient's daughters who were uncertain of decision about proceeding with gastrostomy on 10/14.  Today IR is consulted as the family and patient express interest in gastrostomy placement.  Indications, risks, benefits, and long term management and expectations regarding gastrostomy tubes were discussed today with his daughters via telephone.  He is currently on aspirin (81 mg QD) and prophylactic Lovenox. (50 mg QHS).  Previously on Plavix which has been sld for several days.      Past Medical History:  Diagnosis Date   Diabetes mellitus without complication (HCC)    GERD (gastroesophageal reflux disease)    Hypertension    Stroke [facility])     Past Surgical History:  Procedure Laterality Date   TEE WITHOUT CARDIOVERSION N/A 10/08/2020   Procedure: TRANSESOPHAGEAL ECHOCARDIOGRAM (TEE);  Surgeon: Corey Skains, MD;  Location: [facility] ORS;  Service: Cardiovascular;  Laterality: N/A;    Allergies: Patient has no known allergies.  Medications: Prior  to Admission medications   Medication Sig Start Date End Date Taking? Authorizing Provider  amLODipine (NORVASC) 10 MG tablet Take 1 tablet (10 mg total) by mouth daily. 11/24/20  Yes Angiulli, Lavon Paganini, PA-C  aspirin 81 MG chewable tablet Chew 1 tablet (81 mg total) by mouth daily. 10/25/20  Yes Florencia Reasons, MD  atorvastatin (LIPITOR) 80 MG tablet Take 1 tablet (80 mg total) by mouth daily. 11/24/20  Yes Angiulli, Lavon Paganini, PA-C  B Complex Vitamins (VITAMIN B COMPLEX) TABS Take 1 tablet by mouth daily. 11/24/20  Yes Angiulli, Lavon Paganini, PA-C  blood glucose meter kit and supplies Dispense based on patient and insurance preference. Use up to four times daily as directed. (FOR ICD-10 E10.9, E11.9). 11/24/20  Yes Angiulli, Lavon Paganini, PA-C  Blood Glucose Monitoring Suppl (BLOOD GLUCOSE MONITOR SYSTEM) w/Device KIT Use as directed up to 4 times daily. 11/24/20  Yes Angiulli, Lavon Paganini, PA-C  cefdinir (OMNICEF) 300 MG capsule Take 1 capsule (300 mg total) by mouth 2 (two) times daily for 7 days. 12/02/20 12/09/20 Yes Duffy Bruce, MD  clopidogrel (PLAVIX) 75 MG tablet Take 1 tablet (75 mg total) by mouth daily. 11/24/20  Yes Angiulli, Lavon Paganini, PA-C  glimepiride (AMARYL) 2 MG tablet Take 1 tablet (2 mg total) by mouth daily with breakfast. 11/24/20  Yes Angiulli, Lavon Paganini, PA-C  metFORMIN (GLUCOPHAGE) 500 MG tablet Take 1 tablet (500 mg total) by mouth 2 (two) times daily with a meal. 11/24/20  Yes Angiulli, Lavon Paganini, PA-C  methylphenidate (RITALIN) 10 MG tablet Take 1 tablet (10 mg total) by mouth 2 (two) times daily with breakfast and lunch. 11/24/20  Yes Angiulli, Lavon Paganini, PA-C  metroNIDAZOLE (FLAGYL) 500 MG  tablet Take 1 tablet (500 mg total) by mouth 3 (three) times daily for 7 days. 12/02/20 12/09/20 Yes Duffy Bruce, MD  pioglitazone (ACTOS) 30 MG tablet Take 1 tablet by mouth daily. 10/01/20  Yes [provider]  polyethylene glycol (MIRALAX / GLYCOLAX) 17 g packet Take 17 g by mouth 2 (two) times  daily. 11/24/20  Yes Angiulli, Lavon Paganini, PA-C  tamsulosin (FLOMAX) 0.4 MG CAPS capsule Take 1 capsule (0.4 mg total) by mouth daily after supper. 11/24/20  Yes Angiulli, Lavon Paganini, PA-C  traZODone (DESYREL) 50 MG tablet Take 0.5 tablets (25 mg total) by mouth at bedtime as needed for sleep. 11/24/20  Yes Angiulli, Lavon Paganini, PA-C  Vitamin D, Ergocalciferol, (DRISDOL) 1.25 MG (50000 UNIT) CAPS capsule Take 1 capsule (50,000 Units total) by mouth once a week. 11/28/20  Yes Angiulli, Lavon Paganini, PA-C     Family History  Problem Relation Age of Onset   Prostate cancer Neg Hx    Bladder Cancer Neg Hx    Kidney cancer Neg Hx     Social History   Socioeconomic History   Marital status: Widowed    Spouse name: Not on file   Number of children: Not on file   Years of education: Not on file   Highest education level: Not on file  Occupational History   Not on file  Tobacco Use   Smoking status: Never   Smokeless tobacco: Never  Substance and Sexual Activity   Alcohol use: Yes   Drug use: No   Sexual activity: Not on file  Other Topics Concern   Not on file  Social History Narrative   Not on file   Social Determinants of Health   Financial Resource Strain: Not on file  Food Insecurity: Not on file  Transportation Needs: Not on file  Physical Activity: Not on file  Stress: Not on file  Social Connections: Not on file    Review of Systems: A 12 point ROS discussed and pertinent positives are indicated in the HPI above.  All other systems are negative.   Vital Signs: BP (!) 146/79 (BP Location: Left Arm)   Pulse 93   Temp 98.9 F (37.2 C)   Resp 20   Ht 6' (1.829 m)   Wt 102.7 kg   SpO2 99%   BMI 30.71 kg/m   Physical Exam  Imaging: CT AP 12/02/20    Labs:  CBC: Recent Labs    11/21/20 0453 12/02/20 1819 12/03/20 2006 12/06/20 0657 12/08/20 0401  WBC 5.8 8.5 7.7 6.1  --   HGB 12.9* 13.5 13.2 12.1* 11.0*  HCT 38.4* 40.0 38.7* 37.2*  --   PLT 311 374 365 291   --     COAGS: Recent Labs    10/05/20 2002 12/03/20 2155  INR 1.0 1.1  APTT 29 30    BMP: Recent Labs    12/02/20 1819 12/03/20 2006 12/05/20 0427 12/06/20 0657 12/08/20 0401  NA 138 135  --  139 137  K 3.9 3.5  --  3.4* 3.3*  CL 105 104  --  106 109  CO2 25 21*  --  21* 21*  GLUCOSE 173* 208*  --  132* 195*  BUN 15 14  --  14 12  CALCIUM 9.2 9.4  --  9.1 9.1  CREATININE 0.99 1.01 0.90 0.91 0.78  GFRNONAA >60 >60 >60 >60 >60    LIVER FUNCTION TESTS: Recent Labs    12/02/20 1819 12/03/20 2006 12/06/20  2952 12/08/20 0401  BILITOT 1.1 0.8 0.7 0.6  AST 195* 172* 117* 80*  ALT 272* 241* 149* 102*  ALKPHOS 68 70 57 59  PROT 7.7 7.5 7.0 6.6  ALBUMIN 3.3* 3.2* 3.0* 2.8*    TUMOR MARKERS: No results for input(s): AFPTM, CEA, CA199, CHROMGRNA in the last 8760 hours.  Assessment and Plan: 65 year old male with history of recent stroke with subsequent aphasia and mild dysphagia.  He is currently admitted with diverticulitis and bacteremia.  His medical team and family have considered gastrostomy tube placement for supplemental nutrition.  It seems that main barriers to adequate caloric intake currently are mild dysphagia, thrush, and diverticulitis.  His daughters state that he was eating after discharge from rehabilitation, however this lessened as he got sick prior to current admission.  I presented the alternatives to percutaneous gastrostomy including continuing supplemental nutrition per os with assistance, nasoenteric feeding tube placement while inpatient, and percutaneous gastrostomy.   At this time, they are most interested in nasoenteric feeding tube placement in hopes that his appetite and intake improve by mouth as he recovers from infection, and avoiding the risks of chronic leaking, bleeding, and peritonitis associated with gastrostomy placement.  I support this plan.  If this proves to be futile, percutaneous gastrostomy remains an option.  He will need to  hold Plavix for a total of 5 days, aspirin for 3 days, and prophylactic Lovenox for a single dose prior to gastrostomy placement per SIR guidelines, if permitted by Neurology/Neurosurgery.    Thank you for this interesting consult.  I greatly enjoyed meeting Raymond Kerr and look forward to participating in their care.  A copy of this report was sent to the requesting provider on this date.  Electronically Signed: Suzette Battiest, MD 12/09/2020, 12:12 PM   I spent a total of 80 Minutes  in face to face in clinical consultation, greater than 50% of which was counseling/coordinating care for enteric access for supplemental nutrition.

## 2020-12-09 NOTE — Plan of Care (Signed)
PMT note:  Patient is resting in bed. He looks at me, but he does not shake or nod his head to any questions I ask. No family at bedside. Spoke with daughter [name]. [name] tells me her phone has 3% left. Plans for a meeting in the morning at 10:00 at bedside. Requested her sisters to come or be available to talk.

## 2020-12-09 NOTE — Progress Notes (Signed)
Pt removed diahobb tube out. Tried to put another one but pt refused . MD notified. Order to leave the tube out for tonight

## 2020-12-09 NOTE — Progress Notes (Signed)
Pt remained non-verbal during shift, was given a full bed bath, complete linen and gown change. Mouth care done, thrush noted, pt requested to gurgle mouth wash and spit, he was unable to perform task. Tothettes were then used to clean and moisturize oral mucosa

## 2020-12-09 NOTE — Progress Notes (Signed)
Patient ID: Raymond Kerr, male   DOB: [birth year], 65 y.o.   MRN: [id number] [facility] PROGRESS NOTE  Raymond Kerr IRS:[id number] DOB: 1955-04-14 DOA: 12/03/2020 PCP: Barbette Reichmann, MD  HPI/Subjective: Patient was initially called back by the ER because of a positive blood culture.  He was being treated for diverticulitis.  Family concerned about how much intake he is having.  Patient did not do well with a calorie count and dietitian recommended a PEG.  Initially the patient was hesitant about a PEG but then was agreeable.  Objective: Vitals:   12/09/20 1212 12/09/20 1633  BP: 132/76 (!) 155/93  Pulse: 96 (!) 102  Resp: 18 18  Temp: 98.8 F (37.1 C) 98.6 F (37 C)  SpO2:  100%    Intake/Output Summary (Last 24 hours) at 12/09/2020 1824 Last data filed at 12/09/2020 1500 Gross per 24 hour  Intake --  Output 220 ml  Net -220 ml   Filed Weights   12/03/20 1955  Weight: 102.7 kg    ROS: Review of Systems  Respiratory:  Negative for shortness of breath.   Cardiovascular:  Negative for chest pain.  Gastrointestinal:  Negative for abdominal pain.  Exam: Physical Exam HENT:     Head: Normocephalic.     Mouth/Throat:     Pharynx: No oropharyngeal exudate.  Eyes:     Conjunctiva/sclera: Conjunctivae normal.     Comments: Left eyelid ptosis  Cardiovascular:     Rate and Rhythm: Normal rate and regular rhythm.     Heart sounds: Normal heart sounds, S1 normal and S2 normal.  Pulmonary:     Breath sounds: No decreased breath sounds, wheezing, rhonchi or rales.  Abdominal:     Palpations: Abdomen is soft.     Tenderness: There is no abdominal tenderness.  Musculoskeletal:     Right lower leg: Swelling present.     Left lower leg: Swelling present.  Skin:    General: Skin is warm.     Findings: No rash.  Neurological:     Mental Status: He is alert.     Comments: Able to shake his head yes or no to questions.      Scheduled Meds:  amLODipine  10 mg  Oral Daily   amoxicillin-clavulanate  1 tablet Oral Q12H   B-complex with vitamin C  1 tablet Oral Daily   chlorhexidine  15 mL Mouth Rinse BID   enoxaparin (LOVENOX) injection  40 mg Subcutaneous Q24H   feeding supplement  237 mL Oral TID BM   fluconazole  100 mg Oral Daily   insulin aspart  0-15 Units Subcutaneous TID WC   insulin aspart  0-5 Units Subcutaneous QHS   insulin glargine-yfgn  8 Units Subcutaneous QHS   mouth rinse  15 mL Mouth Rinse q12n4p   methylphenidate  10 mg Oral BID WC   pantoprazole  40 mg Oral QPM   senna-docusate  3 tablet Oral QHS   tamsulosin  0.4 mg Oral QPC supper   Brief history: 65 year old man with type 2 diabetes mellitus GERD hypertension and a stroke that left him with right-sided paralysis and aphasia.  The patient was called back by the ER because of a positive blood culture.  They were treating diverticulitis.  The blood culture was a contamination.  Family concerned about his intake.  Calorie count showed that he was not keeping up with his nutritional needs.  A PEG feeding tube was discussed but initially the patient shook his head no.  In a family meeting today the patient shook his head yes that he will undergo the PEG.  I consulted interventional radiology and they recommended a Dobbhoff feeding tube.  Patient pulled out Dobbhoff tube and then was shaking his head no when they were trying to talk to him about replacing it.  Assessment/Plan:  Decreased oral intake and failure to thrive with moderate malnutrition.  Patient now agreeable for PEG feeding tube.  Consulted interventional radiology to place feeding tube and they recommended a Dobbhoff feeding tube.  I have been holding his Plavix since Friday just in case PEG tube needed.  Patient pulled out the Dobbhoff tube already. Diverticulitis on CAT scan of the abdomen pelvis.  Continue Augmentin for 2 more days Initial blood cultures drawn in the ER was a skin contamination. Thrush on IV  Diflucan Completed nonhemorrhagic CVA with right-sided hemiparesis and aphasia.  Holding Plavix at this point secondary to possible PEG.  Will hold aspirin at this point just in case PEG needed.  Holding Lipitor with elevated liver function test.  Patient already pulled out the Dobbhoff tube. Type 2 diabetes mellitus with hyperlipidemia.  Holding Lipitor with elevated liver function test.  Holding metformin and glimepiride.  Currently on Semglee insulin Essential hypertension on Norvasc BPH on Flomax Palliative care to meet with family tomorrow.    Code Status:     Code Status Orders  (From admission, onward)           Start     Ordered   12/03/20 2139  Full code  Continuous        12/03/20 2141           Code Status History     Date Active Date Inactive Code Status Order ID Comments User Context   10/24/2020 1646 11/26/2020 0141 Full Code [id number]  Lynnae Prude Inpatient   10/24/2020 1646 10/24/2020 1646 Full Code [id number]  Lynnae Prude Inpatient   10/06/2020 0233 10/24/2020 1644 Full Code [id number]  Mansy, Vernetta Honey, MD ED      Family Communication: Spoke with both daughters at the bedside Disposition Plan: Status is: Inpatient  Consultants: Interventional radiology  Antibiotics: Augmentin  Time spent: 33 minutes  Tan[employer]

## 2020-12-09 NOTE — Plan of Care (Signed)
  Problem: Skin Integrity: Goal: Risk for impaired skin integrity will decrease Outcome: Progressing   Problem: Education: Goal: Knowledge of General Education information will improve Description: Including pain rating scale, medication(s)/side effects and non-pharmacologic comfort measures 12/09/2020 0456 by Narda Amber, RN Outcome: Not Progressing 12/09/2020 0454 by Narda Amber, RN Outcome: Not Progressing

## 2020-12-10 DIAGNOSIS — R630 Anorexia: Secondary | ICD-10-CM

## 2020-12-10 DIAGNOSIS — I69951 Hemiplegia and hemiparesis following unspecified cerebrovascular disease affecting right dominant side: Secondary | ICD-10-CM

## 2020-12-10 LAB — GLUCOSE, CAPILLARY
Glucose-Capillary: 200 mg/dL — ABNORMAL HIGH (ref 70–99)
Glucose-Capillary: 236 mg/dL — ABNORMAL HIGH (ref 70–99)
Glucose-Capillary: 203 mg/dL — ABNORMAL HIGH (ref 70–99)
Glucose-Capillary: 238 mg/dL — ABNORMAL HIGH (ref 70–99)

## 2020-12-10 LAB — COMPREHENSIVE METABOLIC PANEL
ALT: 96 U/L — ABNORMAL HIGH (ref 0–44)
AST: 81 U/L — ABNORMAL HIGH (ref 15–41)
Albumin: 2.3 g/dL — ABNORMAL LOW (ref 3.5–5.0)
Alkaline Phosphatase: 63 U/L (ref 38–126)
Anion gap: 8 (ref 5–15)
BUN: 11 mg/dL (ref 8–23)
CO2: 23 mmol/L (ref 22–32)
Calcium: 9.1 mg/dL (ref 8.9–10.3)
Chloride: 104 mmol/L (ref 98–111)
Creatinine, Ser: 0.72 mg/dL (ref 0.61–1.24)
GFR, Estimated: >60 mL/min (ref >60)
Glucose, Bld: 214 mg/dL — ABNORMAL HIGH (ref 70–99)
Potassium: 3.6 mmol/L (ref 3.5–5.1)
Sodium: 135 mmol/L (ref 135–145)
Total Bilirubin: 0.4 mg/dL (ref 0.3–1.2)
Total Protein: 6.7 g/dL (ref 6.5–8.1)

## 2020-12-10 LAB — PHOSPHORUS: Phosphorus: 2.6 mg/dL (ref 2.5–4.6)

## 2020-12-10 LAB — MAGNESIUM: Magnesium: 1.6 mg/dL — ABNORMAL LOW (ref 1.7–2.4)

## 2020-12-10 MED ORDER — MEGESTROL ACETATE 400 MG/10ML PO SUSP
400.0000 mg | Freq: Every day | ORAL | Status: DC
  Administered 2020-12-10 – 2020-12-12 (×3): 400 mg via ORAL
  Filled 2020-12-10 (×4): qty 10

## 2020-12-10 MED ORDER — DRONABINOL 2.5 MG PO CAPS
5.0000 mg | ORAL_CAPSULE | Freq: Two times a day (BID) | ORAL | Status: DC
  Administered 2020-12-10 – 2020-12-12 (×4): 5 mg via ORAL
  Filled 2020-12-10 (×4): qty 2

## 2020-12-10 NOTE — Consult Note (Addendum)
Consultation Note Date: 12/10/2020   Patient Name: Raymond Kerr  DOB: [birth year]  MRN: [id number]  Age / Sex: 65 y.o., male  PCP: Tracie Harrier, MD Referring Physician: Sharen Hones, MD  Reason for Consultation: Establishing goals of care  HPI/Patient Profile: Raymond Kerr is a 65 y.o. 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  History limited due to nonverbal status but able to nod and shake his head and answer to questions.  Patient has had no nausea or vomiting or fevers, cough or shortness of breath but over the past several days did appear to wince when eating.    Clinical Assessment and Goals of Care: Patient is resting in bed. He is alert. One of his daughters is present for our meeting and tries to feed him, and he refuses. She states [name], who I spoke with yesterday, made her aware of the meeting, but she is the only one who will be present.   She states her father is not married. 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 She states he loved his independence and freedom. She states since the stroke, he is nonverbal, does not move his extremities very often, and needs total care.  We discussed his diagnosis, prognosis, GOC, EOL wishes disposition and options.  Created space and opportunity for patient  to explore thoughts and feelings regarding current medical information.   A detailed discussion was had today regarding advanced directives.  Concepts specific to code status, artifical feeding and hydration, IV antibiotics and rehospitalization were discussed.  The difference between  an aggressive medical intervention path and a comfort care path was discussed.  Values and goals of care important to patient and family were attempted to be elicited.  Discussed limitations of medical interventions to prolong quality of life in some situations and discussed the concept of human mortality.  She states her father would want his children to take care of him. Discussed what would be an acceptable QOL for him. He tells me initially she is unsure of an acceptable QOL. She acknowledges she does not want to let him go. Discussed trying to make him to eat and drink. Discussed the ease of which a feeding tube can be removed once placed, and that it will not improve his QOL. Discussed poor prognosis. She states she will talk with her sisters.    SUMMARY OF RECOMMENDATIONS   Daughters will talk about care moving forward. I will f/u tomorrow morning at 10:00  Prognosis:  Poor      Primary Diagnoses: Present on Admission:  Bacteremia due to Staphylococcus  Essential hypertension   I have reviewed the medical record, interviewed the patient and family, and examined the patient. The following aspects are pertinent.  Past Medical History:  Diagnosis Date   Diabetes mellitus without complication (HCC)    GERD (gastroesophageal reflux disease)    Hypertension    Stroke [facility])    Social History   Socioeconomic History   Marital status: Widowed    Spouse name: Not on file   Number of children: Not on file   Years of education: Not on file   Highest education level: Not on file  Occupational History   Not on file  Tobacco Use   Smoking status: Never   Smokeless tobacco: Never  Substance and Sexual Activity   Alcohol use: Yes   Drug use: No   Sexual activity: Not on file  Other Topics Concern   Not on file  Social History Narrative   Not on file   Social Determinants of Health   Financial Resource Strain: Not on file  Food Insecurity: Not on file  Transportation  Needs: Not on file  Physical Activity: Not on file  Stress: Not on file  Social Connections: Not on file   Family History  Problem Relation Age of Onset   Prostate cancer Neg Hx    Bladder Cancer Neg Hx    Kidney cancer Neg Hx    Scheduled Meds:  amLODipine  10 mg Oral Daily   amoxicillin-clavulanate  1 tablet Oral Q12H   B-complex with vitamin C  1 tablet Oral Daily   chlorhexidine  15 mL Mouth Rinse BID   enoxaparin (LOVENOX) injection  40 mg Subcutaneous Q24H   feeding supplement  237 mL Oral TID BM   fluconazole  100 mg Oral Daily   insulin aspart  0-15 Units Subcutaneous TID WC   insulin aspart  0-5 Units Subcutaneous QHS   insulin glargine-yfgn  8 Units Subcutaneous QHS   mouth rinse  15 mL Mouth Rinse q12n4p   methylphenidate  10 mg Oral BID WC   pantoprazole  40 mg Oral QPM   senna-docusate  3 tablet Oral QHS   tamsulosin  0.4 mg Oral QPC supper   Continuous Infusions: PRN Meds:.acetaminophen **OR** acetaminophen, ondansetron **OR** ondansetron (ZOFRAN) IV, traZODone Medications Prior to Admission:  Prior to Admission medications   Medication Sig Start Date End Date Taking? Authorizing Provider  amLODipine (NORVASC) 10 MG tablet Take 1 tablet (10 mg total) by mouth daily. 11/24/20  Yes Angiulli, Lavon Paganini, PA-C  aspirin 81 MG chewable tablet Chew 1 tablet (81 mg total) by mouth daily. 10/25/20  Yes Florencia Reasons, MD  atorvastatin (LIPITOR) 80 MG tablet Take 1 tablet (80 mg total) by mouth daily. 11/24/20  Yes Angiulli, Lavon Paganini, PA-C  B Complex Vitamins (VITAMIN B COMPLEX) TABS Take 1 tablet by mouth daily. 11/24/20  Yes Angiulli, Lavon Paganini, PA-C  blood glucose meter kit and supplies Dispense based on patient and insurance preference. Use up to four times daily as directed. (FOR ICD-10 E10.9, E11.9). 11/24/20  Yes Angiulli, Lavon Paganini, PA-C  Blood Glucose Monitoring Suppl (BLOOD GLUCOSE MONITOR SYSTEM) w/Device KIT Use as directed up to 4 times daily. 11/24/20  Yes Angiulli, Lavon Paganini,  PA-C  clopidogrel (PLAVIX) 75 MG tablet Take 1 tablet (75 mg total) by mouth daily. 11/24/20  Yes Angiulli, Lavon Paganini, PA-C  glimepiride (AMARYL) 2 MG tablet Take 1 tablet (2 mg total) by mouth daily with breakfast. 11/24/20  Yes Angiulli, Lavon Paganini, PA-C  metFORMIN (GLUCOPHAGE) 500 MG tablet Take 1 tablet (500 mg total) by mouth 2 (two) times daily with a meal. 11/24/20  Yes  Angiulli, Lavon Paganini, PA-C  methylphenidate (RITALIN) 10 MG tablet Take 1 tablet (10 mg total) by mouth 2 (two) times daily with breakfast and lunch. 11/24/20  Yes Angiulli, Lavon Paganini, PA-C  pioglitazone (ACTOS) 30 MG tablet Take 1 tablet by mouth daily. 10/01/20  Yes [provider]  polyethylene glycol (MIRALAX / GLYCOLAX) 17 g packet Take 17 g by mouth 2 (two) times daily. 11/24/20  Yes Angiulli, Lavon Paganini, PA-C  tamsulosin (FLOMAX) 0.4 MG CAPS capsule Take 1 capsule (0.4 mg total) by mouth daily after supper. 11/24/20  Yes Angiulli, Lavon Paganini, PA-C  traZODone (DESYREL) 50 MG tablet Take 0.5 tablets (25 mg total) by mouth at bedtime as needed for sleep. 11/24/20  Yes Angiulli, Lavon Paganini, PA-C  Vitamin D, Ergocalciferol, (DRISDOL) 1.25 MG (50000 UNIT) CAPS capsule Take 1 capsule (50,000 Units total) by mouth once a week. 11/28/20  Yes Angiulli, Lavon Paganini, PA-C   No Known Allergies Review of Systems  Unable to perform ROS  Physical Exam  Vital Signs: BP (!) 161/84 (BP Location: Left Arm)   Pulse (!) 103   Temp 98.9 F (37.2 C) (Oral)   Resp 16   Ht 6' (1.829 m)   Wt 102.7 kg   SpO2 100%   BMI 30.71 kg/m  Pain Scale: Faces   Pain Score: 0-No pain   SpO2: SpO2: 100 % O2 Device:SpO2: 100 % O2 Flow Rate: .O2 Flow Rate (L/min): 0 L/min  IO: Intake/output summary:  Intake/Output Summary (Last 24 hours) at 12/10/2020 1313 Last data filed at 12/10/2020 0501 Gross per 24 hour  Intake 240 ml  Output 370 ml  Net -130 ml    LBM: Last BM Date: 12/09/20 Baseline Weight: Weight: 102.7 kg Most recent weight: Weight: 102.7  kg      Flowsheet Rows    Flowsheet Row Most Recent Value  Intake Tab   Referral Department Hospitalist  Unit at Time of Referral Med/Surg Unit  Palliative Care Primary Diagnosis Sepsis/Infectious Disease  Date Notified 12/05/20  Palliative Care Type New Palliative care  Reason for referral Clarify Goals of Care  Date of Admission 12/03/20  Date first seen by Palliative Care 12/05/20  # of days Palliative referral response time 0 Day(s)  # of days IP prior to Palliative referral 2  Clinical Assessment   Palliative Performance Scale Score 30%  Pain Max last 24 hours Not able to report  Pain Min Last 24 hours Not able to report  Dyspnea Max Last 24 Hours Not able to report  Dyspnea Min Last 24 hours Not able to report  Psychosocial & Spiritual Assessment   Palliative Care Outcomes        Time In: 10:15 Time Out: 10:45 Time Total: 30 min Greater than 50%  of this time was spent counseling and coordinating care related to the above assessment and plan.  Signed by: Asencion Gowda, NP   Please contact Palliative Medicine Team phone at [phone] for questions and concerns.  For individual provider: See Shea Evans

## 2020-12-10 NOTE — Progress Notes (Signed)
Nutrition Follow-up  DOCUMENTATION CODES:  Non-severe (moderate) malnutrition in context of chronic illness  INTERVENTION:  Continue current diet as ordered, encourage PO intake Nursing to assist with set-up and feeding pt Continue MVI Ensure Enlive po TID, each supplement provides 350 kcal and 20 grams of protein   If in align with GOC, pt would benefit from PEG placement to ensure nutrition needs are met. Could be utilized for nocturnal feeds and allow pt to eat for pleasure during the day. Would recommend the following: Glucerna 1.5 at 23m/h Free water flushes [id number] Regimen provides 2160 kcal, 119g of protein, and [id number] free water (TF+flush) Obtain weekly weights  NUTRITION DIAGNOSIS:  Moderate Malnutrition (in the context of chronic illness (CVA with residual deficits)) related to dysphagia (altered mental status) as evidenced by mild muscle depletion, percent weight loss (6.6% x 1 month).  GOAL:  Patient will meet greater than or equal to 90% of their needs  MONITOR:  PO intake, Supplement acceptance, Labs, Weight trends  REASON FOR ASSESSMENT:  Consult Calorie Count  ASSESSMENT:  64 36o. male with history of HTN, HLD, DM, GERD and recent CVA (right hemiparesis, aphasia and dysphagia) presented to ED after blood culture grew back staph positive cocci. Pt had been seen in ED the night before due to AMS.   New consult received for TF recommendations. Pt resting with eyes closed at the time of assessment, no family present in room.   48-hour calorie count completed this week and pt consistently not meeting needs. This was the case at the end of last admission as well and PEG was discussed and recommended with family prior to dc to [facility]. Pt was followed by RD at rehab and noted that pt's appetite remained poor during his time in rehab.   Weight hx shows a 6.6% weight loss in the last month, which is severe.  MD discussed feeding tube with pt and family and initially  agreed to PEG placement. IR consulted and discussed alternatives as mild dysphagia and thrush were cited as reasons for poor PO intake. Family decided upon placement of Dobbhoff tube which pt promptly removed upon return to his room. Refused to let RN replace. Palliative care consult placed and family meeting planned for today.   Of note: poor PO intake has been present since the time of CVA. [facility] - this is most likely not an acute problem related to GI distress or thrush. SLP documented 10/14 during assessment that pt presented with oropharyngeal phase dysphagia w/ added impact from Language/Aphasia deficits on his overall awareness/attention during po tasks. This is similar presentation to dc SLP notes from [facility] two weeks prior. Pt requires feeding assistance and verbal cues to perform tasks throughout the meal. This is likely pt's new baseline and meeting nutrition needs with be extremely difficult unless improvements in cognition and functional status can be achieved. PEG will allow pt to continue PO intake as he desires while protecting his muscle stores by ensure adequate nutrition is being provided. Do not feel that NGT will be beneficial as it will be uncomfortable for pt and not a long-term solution, which will likely be needed for pt to combat further weight loss and functional decline.     Nutritionally Relevant Medications: Scheduled Meds:  amoxicillin-clavulanate  1 tablet Oral Q12H   B-complex with vitamin C  1 tablet Oral Daily   feeding supplement  237 mL Oral TID BM  insulin aspart  0-15 Units Subcutaneous TID WC   insulin aspart  0-5 Units Subcutaneous QHS   insulin glargine-yfgn  8 Units Subcutaneous QHS   pantoprazole  40 mg Oral QPM   senna-docusate  3 tablet Oral QHS   PRN Meds: ondansetron  Labs Reviewed  NUTRITION - FOCUSED PHYSICAL EXAM: Flowsheet Row Most Recent Value  Orbital Region No  depletion  Upper Arm Region No depletion  Thoracic and Lumbar Region No depletion  Buccal Region No depletion  Temple Region No depletion  Clavicle Bone Region No depletion  Clavicle and Acromion Bone Region Mild depletion  Scapular Bone Region Mild depletion  Dorsal Hand Mild depletion  Patellar Region Mild depletion  Anterior Thigh Region Mild depletion  Posterior Calf Region Mild depletion  Edema (RD Assessment) Mild  Hair Reviewed  Eyes Unable to assess  Mouth Unable to assess  Skin Reviewed  Nails Reviewed   Diet Order:   Diet Order             DIET DYS 3 Room service appropriate? Yes with Assist; Fluid consistency: Thin  Diet effective now                   EDUCATION NEEDS:  No education needs have been identified at this time  Skin:  Skin Assessment: Reviewed RN Assessment  Last BM:  10/18 - type  Height:  Ht Readings from Last 1 Encounters:  12/03/20 6' (1.829 m)   Weight:  Wt Readings from Last 1 Encounters:  12/03/20 102.7 kg   Ideal Body Weight:  80.9 kg  BMI:  Body mass index is 30.71 kg/m.  Estimated Nutritional Needs:  Kcal:  2200-2400 kcal/d Protein:  110-130 g/d Fluid:  2-2.2 L/d  Ranell Patrick, RD, LDN Clinical Dietitian RD pager # available in [place]  After hours/weekend pager # available in [facility]

## 2020-12-10 NOTE — Progress Notes (Signed)
PROGRESS NOTE    Raymond Kerr  ZOX:[id number] DOB: 03/10/1955 DOA: 12/03/2020 PCP: Barbette Reichmann, MD    Brief Narrative:  65 year old man with type 2 diabetes mellitus GERD hypertension and a stroke that left him with right-sided paralysis and aphasia.  The patient was called back by the ER because of a positive blood culture.  They were treating diverticulitis.  The blood culture was a contamination.  Family concerned about his intake.  Calorie count showed that he was not keeping up with his nutritional needs.  Interventional radiology was consulted to place a PEG tube, they recommended Dobbhoff tube first.  Patient currently is off aspirin and Plavix, will need off aspirin for 5 days before PEG tube can be placed.  Assessment & Plan:   Principal Problem:   Bacteremia due to Staphylococcus Active Problems:   Type 2 diabetes mellitus with hyperlipidemia (HCC)   Completed stroke (HCC)   Essential hypertension   Spastic hemiparesis of right dominant side as late effect of cerebrovascular disease (HCC)   Aphasia as late effect of cerebrovascular accident   Diverticulitis   Elevated liver function tests   Malnutrition of moderate degree   Decreased oral intake   Failure to thrive in adult  Failure to thrive. Anorexia. Discussed with nutrition, patient still has very poor appetite. Interventional radiology consulted, they will place a Dobbhoff first.  Currently off aspirin and Plavix, will need 5 days of aspirin before PEG tube can be placed. At the meantime, patient will be treated with the megestrol and Marinol to stimulate his appetite. Patient also has been seen by palliative care, due to poor prognosis, palliative care will continue discussed with the family about goal of care.  Acute diverticulitis. Continue oral Augmentin. Initial blood culture was skin contamination.  Thrush Continue Diflucan.  Recent stroke with right-sided hemiparesis and aphasia. Essential  hypertension. Hold off Plavix and aspirin for possible PEG placement.  Type 2 diabetes. Continue current regimen.   Hypokalemia. Hypomagnesemia. Potassium level is better, continue replete magnesium.       DVT prophylaxis: Lovenox Code Status: Full Family Communication:  Disposition Plan:    Status is: Inpatient  Remains inpatient appropriate because: Due to severity of disease.  Patient has very poor p.o. intake, cannot discharge until this is resolved.        I/O last 3 completed shifts: In: 240 [P.O.:240] Out: 370 [Urine:370] No intake/output data recorded.     Consultants:  IR  Procedures:   Antimicrobials: Augmentin  Subjective: Patient is aphasic, spoke with the nurse, patient is on dysphagia diet, but has not been able to eat due to poor appetite.  Confirmed with nutritionist. Patient does not have any short of breath or cough. No fever or chills. No dysuria hematuria.  Objective: Vitals:   12/10/20 0309 12/10/20 0459 12/10/20 0700 12/10/20 1205  BP: 102/60 130/78 134/82 (!) 161/84  Pulse: (!) 126 96 (!) 103 (!) 103  Resp:  Temp:  98 F (36.7 C) 98.2 F (36.8 C) 98.9 F (37.2 C)  TempSrc:  Oral Oral Oral  SpO2: 98% 97% 100% 100%  Weight:      Height:        Intake/Output Summary (Last 24 hours) at 12/10/2020 1406 Last data filed at 12/10/2020 0501 Gross per 24 hour  Intake 240 ml  Output 370 ml  Net -130 ml   Filed Weights  Raymond Ursua1956  Weight: 102.7 kg    Examination:  General exam: Appears  calm and comfortable  Respiratory system: Clear to auscultation. Respiratory effort normal. Cardiovascular system: S1 & S2 heard, RRR. No JVD, murmurs, rubs, gallops or clicks. No pedal edema. Gastrointestinal system: Abdomen is nondistended, soft and nontender. No organomegaly or masses felt. Normal bowel sounds heard. Central nervous system: Alert and aphasic Extremities: Symmetric 5 x 5 power. Skin: No rashes, lesions or  ulcers Psychiatry: Mood & affect appropriate.     Data Reviewed: I have personally reviewed following labs and imaging studies  CBC: Recent Labs  Lab 12/03/20 2006 12/06/20 0657 12/08/20 0401  WBC 7.7 6.1  --   NEUTROABS 5.4  --   --   HGB 13.2 12.1* 11.0*  HCT 38.7* 37.2*  --   MCV 87.2 87.1  --   PLT 365 291  --    Basic Metabolic Panel: Recent Labs  Lab 12/03/20 2006 12/05/20 0427 12/06/20 0657 12/08/20 0401 12/10/20 0509  NA 135  --  139 137 135  K 3.5  --  3.4* 3.3* 3.6  CL 104  --  106 109 104  CO2 21*  --  21* 21* 23  GLUCOSE 208*  --  132* 195* 214*  BUN 14  --  14 12 11   CREATININE 1.01 0.90 0.91 0.78 0.72  CALCIUM 9.4  --  9.1 9.1 9.1  MG  --   --   --   --  1.6*  PHOS  --   --   --   --  2.6   GFR: Estimated Creatinine Clearance: 114.1 mL/min (by C-G formula based on SCr of 0.72 mg/dL). Liver Function Tests: Recent Labs  Lab 12/03/20 2006 12/06/20 0657 12/08/20 0401 12/10/20 0509  AST 172* 117* 80* 81*  ALT 241* 149* 102* 96*  ALKPHOS 70 57 59 63  BILITOT 0.8 0.7 0.6 0.4  PROT 7.5 7.0 6.6 6.7  ALBUMIN 3.2* 3.0* 2.8* 2.3*   No results for input(s): LIPASE, AMYLASE in the last 168 hours. No results for input(s): AMMONIA in the last 168 hours. Coagulation Profile: Recent Labs  Lab 12/03/20 2155  INR 1.1   Cardiac Enzymes: No results for input(s): CKTOTAL, CKMB, CKMBINDEX, TROPONINI in the last 168 hours. BNP (last 3 results) No results for input(s): PROBNP in the last 8760 hours. HbA1C: No results for input(s): HGBA1C in the last 72 hours. CBG: Recent Labs  Lab 12/09/20 1214 12/09/20 1636 12/09/20 2052 12/10/20 0812 12/10/20 1210  GLUCAP 201* 151* 181* 200* 236*   Lipid Profile: No results for input(s): CHOL, HDL, LDLCALC, TRIG, CHOLHDL, LDLDIRECT in the last 72 hours. Thyroid Function Tests: No results for input(s): TSH, T4TOTAL, FREET4, T3FREE, THYROIDAB in the last 72 hours. Anemia Panel: No results for input(s):  VITAMINB12, FOLATE, FERRITIN, TIBC, IRON, RETICCTPCT in the last 72 hours. Sepsis Labs: Recent Labs  Lab 12/03/20 2006 12/03/20 2051 12/04/20 0635 12/05/20 0427  PROCALCITON <0.10  --  <0.10 <0.10  LATICACIDVEN 1.3 1.4  --   --     Recent Results (from the past 240 hour(s))  Blood culture (single)     Status: Abnormal   Collection Time: 12/02/20  6:35 PM   Specimen: BLOOD  Result Value Ref Range Status   Specimen Description   Final    BLOOD BLOOD LEFT FOREARM Performed at [facility], [address], [place], [place]    Special Requests   Final    BOTTLES DRAWN AEROBIC AND ANAEROBIC Blood Culture adequate volume Performed at [facility], [address] [place], [place] [address]    Culture  Setup Time   Final    GRAM POSITIVE COCCI Organism ID to follow IN BOTH AEROBIC AND ANAEROBIC BOTTLES CRITICAL RESULT CALLED TO, READ BACK BY AND VERIFIED WITH: JANE RYAN @1758  12/03/20 MJU    Culture (A)  Final    STAPHYLOCOCCUS AURICULARIS THE SIGNIFICANCE OF ISOLATING THIS ORGANISM FROM A SINGLE VENIPUNCTURE CANNOT BE PREDICTED WITHOUT FURTHER CLINICAL AND CULTURE CORRELATION. SUSCEPTIBILITIES AVAILABLE ONLY ON REQUEST. Performed at [facility], [address] [place], [place]    Report Status 12/06/2020 FINAL  Final  Blood Culture ID Panel (Reflexed)     Status: Abnormal   Collection Time: 12/02/20  6:35 PM  Result Value Ref Range Status   Enterococcus faecalis NOT DETECTED NOT DETECTED Final   Enterococcus Faecium NOT DETECTED NOT DETECTED Final   Listeria monocytogenes NOT DETECTED NOT DETECTED Final   Staphylococcus species DETECTED (A) NOT DETECTED Final    Comment: CRITICAL RESULT CALLED TO, READ BACK BY AND VERIFIED WITH: JANE RYAN @1758  12/03/20 MJU    Staphylococcus aureus (BCID) NOT DETECTED NOT DETECTED Final   Staphylococcus epidermidis NOT DETECTED NOT DETECTED Final   Staphylococcus lugdunensis NOT DETECTED NOT  DETECTED Final   Streptococcus species NOT DETECTED NOT DETECTED Final   Streptococcus agalactiae NOT DETECTED NOT DETECTED Final   Streptococcus pneumoniae NOT DETECTED NOT DETECTED Final   Streptococcus pyogenes NOT DETECTED NOT DETECTED Final   A.calcoaceticus-baumannii NOT DETECTED NOT DETECTED Final   Bacteroides fragilis NOT DETECTED NOT DETECTED Final   Enterobacterales NOT DETECTED NOT DETECTED Final   Enterobacter cloacae complex NOT DETECTED NOT DETECTED Final   Escherichia coli NOT DETECTED NOT DETECTED Final   Klebsiella aerogenes NOT DETECTED NOT DETECTED Final   Klebsiella oxytoca NOT DETECTED NOT DETECTED Final   Klebsiella pneumoniae NOT DETECTED NOT DETECTED Final   Proteus species NOT DETECTED NOT DETECTED Final   Salmonella species NOT DETECTED NOT DETECTED Final   Serratia marcescens NOT DETECTED NOT DETECTED Final   Haemophilus influenzae NOT DETECTED NOT DETECTED Final   Neisseria meningitidis NOT DETECTED NOT DETECTED Final   Pseudomonas aeruginosa NOT DETECTED NOT DETECTED Final   Stenotrophomonas maltophilia NOT DETECTED NOT DETECTED Final   Candida albicans NOT DETECTED NOT DETECTED Final   Candida auris NOT DETECTED NOT DETECTED Final   Candida glabrata NOT DETECTED NOT DETECTED Final   Candida krusei NOT DETECTED NOT DETECTED Final   Candida parapsilosis NOT DETECTED NOT DETECTED Final   Candida tropicalis NOT DETECTED NOT DETECTED Final   Cryptococcus neoformans/gattii NOT DETECTED NOT DETECTED Final    Comment: Performed at [facility], [address] [place], [address] [place]  Resp Panel by RT-PCR (Flu A&B, Covid) Nasopharyngeal Swab     Status: None   Collection Time: 12/02/20  9:26 PM   Specimen: Nasopharyngeal Swab; Nasopharyngeal(NP) swabs in vial transport medium  Result Value Ref Range Status   SARS Coronavirus 2 by RT PCR NEGATIVE NEGATIVE Final    Comment: (NOTE) SARS-CoV-2 target nucleic acids are NOT DETECTED.  The SARS-CoV-2  RNA is generally detectable in upper respiratory specimens during the acute phase of infection. The lowest concentration of SARS-CoV-2 viral copies this assay can detect is 138 copies/mL. A negative result does not preclude SARS-Cov-2 infection and should not be used as the sole basis for treatment or other patient management decisions. A negative result may occur with  improper specimen collection/handling, submission of specimen other than  nasopharyngeal swab, presence of viral mutation(s) within the areas targeted by this assay, and inadequate number of viral copies(<138 copies/mL). A negative result must be combined with clinical observations, patient history, and epidemiological information. The expected result is Negative.  Fact Sheet for Patients:  BloggerCourse.com  Fact Sheet for Healthcare Providers:  SeriousBroker.it  This test is no t yet approved or cleared by the [place] FDA and  has been authorized for detection and/or diagnosis of SARS-CoV-2 by FDA under an Emergency Use Authorization (EUA). This EUA will remain  in effect (meaning this test can be used) for the duration of the COVID-19 declaration under Section 564(b)(1) of the Act, 21 U.S.C.section 360bbb-3(b)(1), unless the authorization is terminated  or revoked sooner.       Influenza A by PCR NEGATIVE NEGATIVE Final   Influenza B by PCR NEGATIVE NEGATIVE Final    Comment: (NOTE) The Xpert Xpress SARS-CoV-2/FLU/RSV plus assay is intended as an aid in the diagnosis of influenza from Nasopharyngeal swab specimens and should not be used as a sole basis for treatment. Nasal washings and aspirates are unacceptable for Xpert Xpress SARS-CoV-2/FLU/RSV testing.  Fact Sheet for Patients: BloggerCourse.com  Fact Sheet for Healthcare Providers: SeriousBroker.it  This test is not yet approved or cleared by the  [place] FDA and has been authorized for detection and/or diagnosis of SARS-CoV-2 by FDA under an Emergency Use Authorization (EUA). This EUA will remain in effect (meaning this test can be used) for the duration of the COVID-19 declaration under Section 564(b)(1) of the Act, 21 U.S.C. section 360bbb-3(b)(1), unless the authorization is terminated or revoked.  Performed at [facility], [address] [place], [place] [address]   Blood culture (routine x 2)     Status: None   Collection Time: 12/03/20  8:06 PM   Specimen: BLOOD RIGHT FOREARM  Result Value Ref Range Status   Specimen Description BLOOD RIGHT FOREARM  Final   Special Requests   Final    BOTTLES DRAWN AEROBIC AND ANAEROBIC Blood Culture results may not be optimal due to an excessive volume of blood received in culture bottles   Culture   Final    NO GROWTH 5 DAYS Performed at [facility], [address] [place], [place] [address]    Report Status 12/08/2020 FINAL  Final  Resp Panel by RT-PCR (Flu A&B, Covid) Nasopharyngeal Swab     Status: None   Collection Time: 12/03/20  8:45 PM   Specimen: Nasopharyngeal Swab; Nasopharyngeal(NP) swabs in vial transport medium  Result Value Ref Range Status   SARS Coronavirus 2 by RT PCR NEGATIVE NEGATIVE Final    Comment: (NOTE) SARS-CoV-2 target nucleic acids are NOT DETECTED.  The SARS-CoV-2 RNA is generally detectable in upper respiratory specimens during the acute phase of infection. The lowest concentration of SARS-CoV-2 viral copies this assay can detect is 138 copies/mL. A negative result does not preclude SARS-Cov-2 infection and should not be used as the sole basis for treatment or other patient management decisions. A negative result may occur with  improper specimen collection/handling, submission of specimen other than nasopharyngeal swab, presence of viral mutation(s) within the areas targeted by this assay, and inadequate number of  viral copies(<138 copies/mL). A negative result must be combined with clinical observations, patient history, and epidemiological information. The expected result is Negative.  Fact Sheet for Patients:  BloggerCourse.com  Fact Sheet for Healthcare Providers:  SeriousBroker.it  This test is no t yet approved or cleared by the  [place] [employer] and  has been authorized for detection and/or diagnosis of SARS-CoV-2 by FDA under an TEFL teacher (EUA). This EUA will remain  in effect (meaning this test can be used) for the duration of the COVID-19 declaration under Section 564(b)(1) of the Act, 21 U.S.C.section 360bbb-3(b)(1), unless the authorization is terminated  or revoked sooner.       Influenza A by PCR NEGATIVE NEGATIVE Final   Influenza B by PCR NEGATIVE NEGATIVE Final    Comment: (NOTE) The Xpert Xpress SARS-CoV-2/FLU/RSV plus assay is intended as an aid in the diagnosis of influenza from Nasopharyngeal swab specimens and should not be used as a sole basis for treatment. Nasal washings and aspirates are unacceptable for Xpert Xpress SARS-CoV-2/FLU/RSV testing.  Fact Sheet for Patients: BloggerCourse.com  Fact Sheet for Healthcare Providers: SeriousBroker.it  This test is not yet approved or cleared by the [place] FDA and has been authorized for detection and/or diagnosis of SARS-CoV-2 by FDA under an Emergency Use Authorization (EUA). This EUA will remain in effect (meaning this test can be used) for the duration of the COVID-19 declaration under Section 564(b)(1) of the Act, 21 U.S.C. section 360bbb-3(b)(1), unless the authorization is terminated or revoked.  Performed at [facility], [address] [place], [place] [address]   MRSA Next Gen by PCR, Nasal     Status: None   Collection Time: 12/03/20  8:51 PM   Specimen: Nasal Mucosa;  Nasal Swab  Result Value Ref Range Status   MRSA by PCR Next Gen NOT DETECTED NOT DETECTED Final    Comment: (NOTE) The GeneXpert MRSA Assay (FDA approved for NASAL specimens only), is one component of a comprehensive MRSA colonization surveillance program. It is not intended to diagnose MRSA infection nor to guide or monitor treatment for MRSA infections. Test performance is not FDA approved in patients less than 39 years old. Performed at [facility], [address] [place], [place] [address]   Blood culture (routine x 2)     Status: None   Collection Time: 12/03/20  9:00 PM   Specimen: BLOOD  Result Value Ref Range Status   Specimen Description BLOOD LEFT ASSIST CONTROL  Final   Special Requests   Final    BOTTLES DRAWN AEROBIC AND ANAEROBIC Blood Culture results may not be optimal due to an inadequate volume of blood received in culture bottles   Culture   Final    NO GROWTH 5 DAYS Performed at [facility], [address], [place], [place] [address]    Report Status 12/08/2020 FINAL  Final         Radiology Studies: No results found.      Scheduled Meds:  amLODipine  10 mg Oral Daily   amoxicillin-clavulanate  1 tablet Oral Q12H   B-complex with vitamin C  1 tablet Oral Daily   chlorhexidine  15 mL Mouth Rinse BID   dronabinol  5 mg Oral BID AC   enoxaparin (LOVENOX) injection  40 mg Subcutaneous Q24H   feeding supplement  237 mL Oral TID BM   fluconazole  100 mg Oral Daily   insulin aspart  0-15 Units Subcutaneous TID WC   insulin aspart  0-5 Units Subcutaneous QHS   insulin glargine-yfgn  8 Units Subcutaneous QHS   mouth rinse  15 mL Mouth Rinse q12n4p   megestrol  400 mg Oral Daily   methylphenidate  10 mg Oral BID WC   pantoprazole  40 mg Oral  QPM   senna-docusate  3 tablet Oral QHS   tamsulosin  0.4 mg Oral QPC supper   Continuous Infusions:   LOS: 7 days    Time spent: 32 minutes    Marrion Coy, MD [facility]   To contact the attending provider between 7A-7P or the covering provider during after hours 7P-7A, please log into the web site [email or web address] and access using universal [place] password for that web site. If you do not have the password, please call the hospital operator.  12/10/2020, 2:06 PM

## 2020-12-10 NOTE — Progress Notes (Signed)
Physical Therapy Treatment Patient Details Name: Raymond Kerr MRN: [id number] DOB: 03/08/1955 Today's Date: 12/10/2020   History of Present Illness Raymond Kerr is a 65 y.o. 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    PT Comments    Entered patients room and limited engagement from patient noted and not responding via head nods to questions asked. Pt found to be incontinent of bladder and required max - totalA for rolling for change of bed linens. Attempted to provide PROM to R UE and pt with increased grimacing. Pt repositioned at end of session and left with all needs within reach. If plan is to take pt home with daughter, additional DME will be required including possible hoyer lift and WC for pressure relief. Will continue to monitor conversations with palliative care for patient's wishes. Will continue to work with patient during hospitalization to maintain function and reduce caregiver burden.    Recommendations for follow up therapy are one component of a multi-disciplinary discharge planning process, led by the attending physician.  Recommendations may be updated based on patient status, additional functional criteria and insurance authorization.  Follow Up Recommendations  Home health PT;Supervision/Assistance - 24 hour     Equipment Recommendations  Wheelchair (measurements PT);Wheelchair cushion (measurements PT);Other (comment) Michiel Sites lift)    Recommendations for Other Services       Precautions / Restrictions Precautions Precautions: Fall Restrictions Weight Bearing Restrictions: No     Mobility  Bed Mobility Overal bed mobility: Needs Assistance Bed  Mobility: Rolling Rolling: Total assist;+2 for physical assistance         General bed mobility comments: Pt requiring +2 assist to roll for change of bed linens. Pt cued for reaching towards bed rails but unable. Increased grimacing when rolling to his R side    Transfers                 General transfer comment: Deferred  Ambulation/Gait             General Gait Details: Pt non-ambulatory at baseline per CIR notes   Stairs             Wheelchair Mobility    Modified Rankin (Stroke Patients Only)       Balance Overall balance assessment: Needs assistance     Sitting balance - Comments: Not assessed                                    Cognition Arousal/Alertness: Awake/alert Behavior During Therapy: Flat affect Overall Cognitive Status: Difficult to assess                                 General Comments: expressive deficits attempting to verbalize words      Exercises Other Exercises Other Exercises: PROM of R elbow into flexion and supination and R shoulder flexion x 3 minutes. Pt found to be incontinent of bladder and assisted with rolling to change linens and gown. Pt repositioned at end of session with pillow to support R UE and promote L cervical rotation due to tendency  to stay rotated towards R side.    General Comments        Pertinent Vitals/Pain Pain Assessment: Faces Faces Pain Scale: Hurts little more Pain Location: [place] with attempt to move R UE into elbow flexion and supination Pain Descriptors / Indicators: Grimacing Pain Intervention(s): Limited activity within patient's tolerance;Monitored during session;Repositioned    Home Living                      Prior Function            PT Goals (current goals can now be found in the care plan section) Acute Rehab PT Goals PT Goal Formulation: With patient Time For Goal Achievement: 12/19/20 Potential to Achieve Goals: Poor Progress  towards PT goals: Progressing toward goals    Frequency    Min 2X/week      PT Plan Current plan remains appropriate    Co-evaluation              AM-PAC PT "6 Clicks" Mobility   Outcome Measure  Help needed turning from your back to your side while in a flat bed without using bedrails?: Total Help needed moving from lying on your back to sitting on the side of a flat bed without using bedrails?: Total Help needed moving to and from a bed to a chair (including a wheelchair)?: Total Help needed standing up from a chair using your arms (e.g., wheelchair or bedside chair)?: Total Help needed to walk in hospital room?: Total Help needed climbing 3-5 steps with a railing? : Total 6 Click Score: 6    End of Session   Activity Tolerance: Patient tolerated treatment well Patient left: in bed;with call bell/phone within reach;with bed alarm set Nurse Communication: Mobility status PT Visit Diagnosis: Unsteadiness on feet (R26.81);Muscle weakness (generalized) (M62.81);Hemiplegia and hemiparesis Hemiplegia - Right/Left: Right Hemiplegia - dominant/non-dominant: Dominant     Time: 3664-4034 PT Time Calculation (min) (ACUTE ONLY): 18 min  Charges:  $Therapeutic Activity: 8-22 mins                     Verl Blalock, SPT    Verl Blalock 12/10/2020, 2:33 PM

## 2020-12-11 DIAGNOSIS — R630 Anorexia: Secondary | ICD-10-CM

## 2020-12-11 DIAGNOSIS — I69320 Aphasia following cerebral infarction: Secondary | ICD-10-CM

## 2020-12-11 LAB — GLUCOSE, CAPILLARY
Glucose-Capillary: 154 mg/dL — ABNORMAL HIGH (ref 70–99)
Glucose-Capillary: 182 mg/dL — ABNORMAL HIGH (ref 70–99)
Glucose-Capillary: 224 mg/dL — ABNORMAL HIGH (ref 70–99)
Glucose-Capillary: 186 mg/dL — ABNORMAL HIGH (ref 70–99)

## 2020-12-11 MED ORDER — MAGNESIUM SULFATE 2 GM/50ML IV SOLN
2.0000 g | Freq: Once | INTRAVENOUS | Status: AC
  Administered 2020-12-11: 2 g via INTRAVENOUS
  Filled 2020-12-11: qty 50

## 2020-12-11 MED ORDER — INSULIN GLARGINE-YFGN 100 UNIT/ML ~~LOC~~ SOLN
12.0000 [IU] | Freq: Every day | SUBCUTANEOUS | Status: DC
  Administered 2020-12-11 – 2020-12-12 (×2): 12 [IU] via SUBCUTANEOUS
  Filled 2020-12-11 (×3): qty 0.12

## 2020-12-11 NOTE — Progress Notes (Signed)
Daily Progress Note   Patient Name: Raymond Kerr       Date: 12/11/2020 DOB: 1955/05/18  Age: 65 y.o. MRN#: [id number] Attending Physician: Marrion Coy, MD Primary Care Physician: Barbette Reichmann, MD Admit Date: 12/03/2020  Reason for Consultation/Follow-up: Establishing goals of care  Subjective: Went to meet with family as planned at 10:00. No family present. Attempted to speak with patient. Asked several questions without response; asked if he wanted a feeding tube placed and he shook his head no. He did not answer any further questions. Family did not arrive.   Length of Stay: 8  Current Medications: Scheduled Meds:   amLODipine  10 mg Oral Daily   amoxicillin-clavulanate  1 tablet Oral Q12H   B-complex with vitamin C  1 tablet Oral Daily   chlorhexidine  15 mL Mouth Rinse BID   dronabinol  5 mg Oral BID AC   enoxaparin (LOVENOX) injection  40 mg Subcutaneous Q24H   feeding supplement  237 mL Oral TID BM   insulin aspart  0-15 Units Subcutaneous TID WC   insulin aspart  0-5 Units Subcutaneous QHS   insulin glargine-yfgn  8 Units Subcutaneous QHS   mouth rinse  15 mL Mouth Rinse q12n4p   megestrol  400 mg Oral Daily   methylphenidate  10 mg Oral BID WC   pantoprazole  40 mg Oral QPM   senna-docusate  3 tablet Oral QHS   tamsulosin  0.4 mg Oral QPC supper    Continuous Infusions:   PRN Meds: acetaminophen **OR** acetaminophen, ondansetron **OR** ondansetron (ZOFRAN) IV, traZODone  Physical Exam Pulmonary:     Effort: Pulmonary effort is normal.  Neurological:     Mental Status: He is alert.            Vital Signs: BP 123/69 (BP Location: Left Arm)   Pulse 87   Temp 98 F (36.7 C) (Axillary)   Resp 18   Ht 6' (1.829 m)   Wt 104 kg   SpO2 96%   BMI 31.10  kg/m  SpO2: SpO2: 96 % O2 Device: O2 Device: Room Air O2 Flow Rate: O2 Flow Rate (L/min): 0 L/min  Intake/output summary:  Intake/Output Summary (Last 24 hours) at 12/11/2020 1148 Last data filed at 12/11/2020 0500 Gross per 24 hour  Intake 225 ml  Output 900  ml  Net -675 ml   LBM: Last BM Date: 12/09/20 Baseline Weight: Weight: 102.7 kg  Flowsheet Rows    Flowsheet Row Most Recent Value  Intake Tab   Referral Department Hospitalist  Unit at Time of Referral Med/Surg Unit  Palliative Care Primary Diagnosis Sepsis/Infectious Disease  Date Notified 12/05/20  Palliative Care Type New Palliative care  Reason for referral Clarify Goals of Care  Date of Admission 12/03/20  Date first seen by Palliative Care 12/05/20  # of days Palliative referral response time 0 Day(s)  # of days IP prior to Palliative referral 2  Clinical Assessment   Palliative Performance Scale Score 30%  Pain Max last 24 hours Not able to report  Pain Min Last 24 hours Not able to report  Dyspnea Max Last 24 Hours Not able to report  Dyspnea Min Last 24 hours Not able to report  Psychosocial & Spiritual Assessment   Palliative Care Outcomes        Patient Active Problem List   Diagnosis Date Noted   Anorexia 12/10/2020   Failure to thrive in adult    Malnutrition of moderate degree 12/06/2020   Decreased oral intake    Elevated liver function tests    Thrush    Benign prostatic hyperplasia without lower urinary tract symptoms    Bacteremia due to Staphylococcus 12/03/2020   Aphasia as late effect of cerebrovascular accident 12/03/2020   Diverticulitis 12/03/2020   Essential hypertension    Spastic hemiparesis of right dominant side as late effect of cerebrovascular disease (HCC)    Uncontrolled type 2 diabetes mellitus with hyperglycemia (HCC)    Left thalamic infarction (HCC) 10/24/2020   Hypotension 10/19/2020   Hypokalemia 10/17/2020   Hypomagnesemia 10/17/2020   Hypophosphatemia  10/17/2020   Pressure injury of skin 10/16/2020   Completed stroke (HCC)    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 Patient shook his head no, that he did not want a feeding tube. Yesterday, discussed that per notes he has pulled a NGT himself and would not allow reinsertion. Discussed that PEG's can be easily removed by patient.     Code Status:    Code Status Orders  (From admission, onward)           Start     Ordered   12/03/20 2139  Full code  Continuous        12/03/20 2141           Code Status History     Date Active Date Inactive Code Status Order ID Comments User Context   10/24/2020 1646 11/26/2020 0141 Full Code [id number]  Lynnae Prude Inpatient   10/24/2020 1646 10/24/2020 1646 Full Code [id number]  Lynnae Prude Inpatient   10/06/2020 0233 10/24/2020 1644 Full Code [id number]  Mansy, Vernetta Honey, MD ED       Prognosis:  Poor overall    Care plan was discussed with attending   Thank you for allowing the Palliative Medicine Team to assist in the care of this  patient.       Total Time 15 min Prolonged Time Billed  no       Greater than 50%  of this time was spent counseling and coordinating care related to the above assessment and plan.  Morton Stall, NP  Please contact Palliative Medicine Team phone at [phone] for questions and concerns.

## 2020-12-11 NOTE — Progress Notes (Signed)
PROGRESS NOTE    Raymond Kerr  TAV:[id number] DOB: 1955/06/08 DOA: 12/03/2020 PCP: Barbette Reichmann, MD    Brief Narrative:  65 year old man with type 2 diabetes mellitus GERD hypertension and a stroke that left him with right-sided paralysis and aphasia.  The patient was called back by the ER because of a positive blood culture.  They were treating diverticulitis.  The blood culture was a contamination.  Family concerned about his intake.  Calorie count showed that he was not keeping up with his nutritional needs.  Interventional radiology was consulted to place a PEG tube, they recommended Dobbhoff tube first.  But patient refused. Patient currently is off aspirin and Plavix, will need off aspirin for 5 days before PEG tube can be placed.   Assessment & Plan:   Active Problems:   Type 2 diabetes mellitus with hyperlipidemia (HCC)   Completed stroke (HCC)   Hypomagnesemia   Essential hypertension   Spastic hemiparesis of right dominant side as late effect of cerebrovascular disease (HCC)   Aphasia as late effect of cerebrovascular accident   Diverticulitis   Elevated liver function tests   Malnutrition of moderate degree   Decreased oral intake   Failure to thrive in adult   Anorexia  Failure to thrive. Anorexia. Started megestrol and Marinol, appetite seem to be better since yesterday.  While waiting for decision about a PEG tube, will continue current treatment. Patient is also seen by palliative care, continue goal of care discussion.  Acute diverticulitis. Continue Augmentin, will finish 10 days of antibiotics.  Oral thrush. Completed 7 days of Diflucan, will discontinue.  Recent stroke with right-sided hemiparesis and aphasia. Essential hypertension. Patient has a severe debility, hold off antiplatelet treatment.  Prognosis is poor.  Type 2 diabetes Continue current regimen.  Hypokalemia. Hypomagnesemia. Recheck levels tomorrow.   DVT prophylaxis:  Lovenox Code Status: Full Family Communication:  Disposition Plan:      Status is: Inpatient   Remains inpatient appropriate because: Due to severity of disease.  Patient has very poor p.o. intake, cannot discharge until this is resolved.       I/O last 3 completed shifts: In: 640 [P.O.:590; Other:50] Out: 1250 [Urine:1250] No intake/output data recorded.     Consultants:  Palliative  Procedures: None  Antimicrobials: None  Subjective: Spoke to nurse, patient appetite seem to be improving since yesterday after adding Marinol and megestrol. Aphasic. Denies any short of breath or cough. No fever or chills.  Objective: Vitals:   12/10/20 1616 12/10/20 2057 12/11/20 0326 12/11/20 0739  BP: 137/79 135/78 103/67 123/69  Pulse: (!) 105 97 89 87  Resp: 20 16 14 18   Temp:  99.4 F (37.4 C) 99.8 F (37.7 C) 98 F (36.7 C)  TempSrc:  Oral  Axillary  SpO2: 100% 96% 100% 96%  Weight:      Height:        Intake/Output Summary (Last 24 hours) at 12/11/2020 1100 Last data filed at 12/11/2020 0500 Gross per 24 hour  Intake 225 ml  Output 1100 ml  Net -875 ml   Filed Weights   12/03/20 1955 12/10/20 1400  Weight: 102.7 kg 104 kg    Examination:  General exam: Appears calm and comfortable  Respiratory system: Clear to auscultation. Respiratory effort normal. Cardiovascular system: S1 & S2 heard, RRR. No JVD, murmurs, rubs, gallops or clicks. No pedal edema. Gastrointestinal system: Abdomen is nondistended, soft and nontender. No organomegaly or masses felt. Normal bowel sounds heard. Central nervous system: Alert  and aphasic Extremities: Symmetric 5 x 5 power. Skin: No rashes, lesions or ulcers Psychiatry: Judgement and insight appear normal. Mood & affect appropriate.     Data Reviewed: I have personally reviewed following labs and imaging studies  CBC: Recent Labs  Lab 12/06/20 0657 12/08/20 0401  WBC 6.1  --   HGB 12.1* 11.0*  HCT 37.2*  --   MCV  87.1  --   PLT 291  --    Basic Metabolic Panel: Recent Labs  Lab 12/05/20 0427 12/06/20 0657 12/08/20 0401 12/10/20 0509  NA  --  139 137 135  K  --  3.4* 3.3* 3.6  CL  --  106 109 104  CO2  --  21* 21* 23  GLUCOSE  --  132* 195* 214*  BUN  --  14 12 11   CREATININE 0.90 0.91 0.78 0.72  CALCIUM  --  9.1 9.1 9.1  MG  --   --   --  1.6*  PHOS  --   --   --  2.6   GFR: Estimated Creatinine Clearance: 114.8 mL/min (by C-G formula based on SCr of 0.72 mg/dL). Liver Function Tests: Recent Labs  Lab 12/06/20 0657 12/08/20 0401 12/10/20 0509  AST 117* 80* 81*  ALT 149* 102* 96*  ALKPHOS 57 59 63  BILITOT 0.7 0.6 0.4  PROT 7.0 6.6 6.7  ALBUMIN 3.0* 2.8* 2.3*   No results for input(s): LIPASE, AMYLASE in the last 168 hours. No results for input(s): AMMONIA in the last 168 hours. Coagulation Profile: No results for input(s): INR, PROTIME in the last 168 hours. Cardiac Enzymes: No results for input(s): CKTOTAL, CKMB, CKMBINDEX, TROPONINI in the last 168 hours. BNP (last 3 results) No results for input(s): PROBNP in the last 8760 hours. HbA1C: No results for input(s): HGBA1C in the last 72 hours. CBG: Recent Labs  Lab 12/10/20 0812 12/10/20 1210 12/10/20 1638 12/10/20 2105 12/11/20 0740  GLUCAP 200* 236* 203* 238* 154*   Lipid Profile: No results for input(s): CHOL, HDL, LDLCALC, TRIG, CHOLHDL, LDLDIRECT in the last 72 hours. Thyroid Function Tests: No results for input(s): TSH, T4TOTAL, FREET4, T3FREE, THYROIDAB in the last 72 hours. Anemia Panel: No results for input(s): VITAMINB12, FOLATE, FERRITIN, TIBC, IRON, RETICCTPCT in the last 72 hours. Sepsis Labs: Recent Labs  Lab 12/05/20 0427  PROCALCITON <0.10    Recent Results (from the past 240 hour(s))  Blood culture (single)     Status: Abnormal   Collection Time: 12/02/20  6:35 PM   Specimen: BLOOD  Result Value Ref Range Status   Specimen Description   Final    BLOOD BLOOD LEFT FOREARM Performed at  [facility], [address], [place], [place]    Special Requests   Final    BOTTLES DRAWN AEROBIC AND ANAEROBIC Blood Culture adequate volume Performed at [facility], [address] [place], [place]    Culture  Setup Time   Final    GRAM POSITIVE COCCI Organism ID to follow IN BOTH AEROBIC AND ANAEROBIC BOTTLES CRITICAL RESULT CALLED TO, READ BACK BY AND VERIFIED WITH: JANE RYAN @1758  12/03/20 MJU    Culture (A)  Final    STAPHYLOCOCCUS AURICULARIS THE SIGNIFICANCE OF ISOLATING THIS ORGANISM FROM A SINGLE VENIPUNCTURE CANNOT BE PREDICTED WITHOUT FURTHER CLINICAL AND CULTURE CORRELATION. SUSCEPTIBILITIES AVAILABLE ONLY ON REQUEST. Performed at [facility], [address] [place], [address] [place]    Report Status 12/06/2020 FINAL  Final  Blood Culture ID Panel (Reflexed)     Status: Abnormal   Collection Time: 12/02/20  6:35 PM  Result Value Ref Range Status   Enterococcus faecalis NOT DETECTED NOT DETECTED Final   Enterococcus Faecium NOT DETECTED NOT DETECTED Final   Listeria monocytogenes NOT DETECTED NOT DETECTED Final   Staphylococcus species DETECTED (A) NOT DETECTED Final    Comment: CRITICAL RESULT CALLED TO, READ BACK BY AND VERIFIED WITH: JANE RYAN @1758  12/03/20 MJU    Staphylococcus aureus (BCID) NOT DETECTED NOT DETECTED Final   Staphylococcus epidermidis NOT DETECTED NOT DETECTED Final   Staphylococcus lugdunensis NOT DETECTED NOT DETECTED Final   Streptococcus species NOT DETECTED NOT DETECTED Final   Streptococcus agalactiae NOT DETECTED NOT DETECTED Final   Streptococcus pneumoniae NOT DETECTED NOT DETECTED Final   Streptococcus pyogenes NOT DETECTED NOT DETECTED Final   A.calcoaceticus-baumannii NOT DETECTED NOT DETECTED Final   Bacteroides fragilis NOT DETECTED NOT DETECTED Final   Enterobacterales NOT DETECTED NOT DETECTED Final   Enterobacter cloacae complex NOT DETECTED NOT DETECTED Final   Escherichia  coli NOT DETECTED NOT DETECTED Final   Klebsiella aerogenes NOT DETECTED NOT DETECTED Final   Klebsiella oxytoca NOT DETECTED NOT DETECTED Final   Klebsiella pneumoniae NOT DETECTED NOT DETECTED Final   Proteus species NOT DETECTED NOT DETECTED Final   Salmonella species NOT DETECTED NOT DETECTED Final   Serratia marcescens NOT DETECTED NOT DETECTED Final   Haemophilus influenzae NOT DETECTED NOT DETECTED Final   Neisseria meningitidis NOT DETECTED NOT DETECTED Final   Pseudomonas aeruginosa NOT DETECTED NOT DETECTED Final   Stenotrophomonas maltophilia NOT DETECTED NOT DETECTED Final   Candida albicans NOT DETECTED NOT DETECTED Final   Candida auris NOT DETECTED NOT DETECTED Final   Candida glabrata NOT DETECTED NOT DETECTED Final   Candida krusei NOT DETECTED NOT DETECTED Final   Candida parapsilosis NOT DETECTED NOT DETECTED Final   Candida tropicalis NOT DETECTED NOT DETECTED Final   Cryptococcus neoformans/gattii NOT DETECTED NOT DETECTED Final    Comment: Performed at [facility], [address] [place], [place]  Resp Panel by RT-PCR (Flu A&B, Covid) Nasopharyngeal Swab     Status: None   Collection Time: 12/02/20  9:26 PM   Specimen: Nasopharyngeal Swab; Nasopharyngeal(NP) swabs in vial transport medium  Result Value Ref Range Status   SARS Coronavirus 2 by RT PCR NEGATIVE NEGATIVE Final    Comment: (NOTE) SARS-CoV-2 target nucleic acids are NOT DETECTED.  The SARS-CoV-2 RNA is generally detectable in upper respiratory specimens during the acute phase of infection. The lowest concentration of SARS-CoV-2 viral copies this assay can detect is 138 copies/mL. A negative result does not preclude SARS-Cov-2 infection and should not be used as the sole basis for treatment or other patient management decisions. A negative result may occur with  improper specimen collection/handling, submission of specimen other than nasopharyngeal swab, presence of viral  mutation(s) within the areas targeted by this assay, and inadequate number of viral copies(<138 copies/mL). A negative result must be combined with clinical observations, patient history, and epidemiological information. The expected result is Negative.  Fact Sheet for Patients:  02/01/21  Fact Sheet for Healthcare Providers:  BloggerCourse.com  This test is no t yet approved or cleared by the [place] FDA and  has been authorized for detection and/or diagnosis of SARS-CoV-2 by FDA under an Emergency Use Authorization (EUA). This EUA will remain  in effect (meaning this test can be used) for the duration of the  COVID-19 declaration under Section 564(b)(1) of the Act, 21 U.S.C.section 360bbb-3(b)(1), unless the authorization is terminated  or revoked sooner.       Influenza A by PCR NEGATIVE NEGATIVE Final   Influenza B by PCR NEGATIVE NEGATIVE Final    Comment: (NOTE) The Xpert Xpress SARS-CoV-2/FLU/RSV plus assay is intended as an aid in the diagnosis of influenza from Nasopharyngeal swab specimens and should not be used as a sole basis for treatment. Nasal washings and aspirates are unacceptable for Xpert Xpress SARS-CoV-2/FLU/RSV testing.  Fact Sheet for Patients: BloggerCourse.com  Fact Sheet for Healthcare Providers: SeriousBroker.it  This test is not yet approved or cleared by the [place] FDA and has been authorized for detection and/or diagnosis of SARS-CoV-2 by FDA under an Emergency Use Authorization (EUA). This EUA will remain in effect (meaning this test can be used) for the duration of the COVID-19 declaration under Section 564(b)(1) of the Act, 21 U.S.C. section 360bbb-3(b)(1), unless the authorization is terminated or revoked.  Performed at [facility], [address] [place], [place] [address]   Blood culture (routine x 2)      Status: None   Collection Time: 12/03/20  8:06 PM   Specimen: BLOOD RIGHT FOREARM  Result Value Ref Range Status   Specimen Description BLOOD RIGHT FOREARM  Final   Special Requests   Final    BOTTLES DRAWN AEROBIC AND ANAEROBIC Blood Culture results may not be optimal due to an excessive volume of blood received in culture bottles   Culture   Final    NO GROWTH 5 DAYS Performed at [facility], [address] [place], [place] [address]    Report Status 12/08/2020 FINAL  Final  Resp Panel by RT-PCR (Flu A&B, Covid) Nasopharyngeal Swab     Status: None   Collection Time: 12/03/20  8:45 PM   Specimen: Nasopharyngeal Swab; Nasopharyngeal(NP) swabs in vial transport medium  Result Value Ref Range Status   SARS Coronavirus 2 by RT PCR NEGATIVE NEGATIVE Final    Comment: (NOTE) SARS-CoV-2 target nucleic acids are NOT DETECTED.  The SARS-CoV-2 RNA is generally detectable in upper respiratory specimens during the acute phase of infection. The lowest concentration of SARS-CoV-2 viral copies this assay can detect is 138 copies/mL. A negative result does not preclude SARS-Cov-2 infection and should not be used as the sole basis for treatment or other patient management decisions. A negative result may occur with  improper specimen collection/handling, submission of specimen other than nasopharyngeal swab, presence of viral mutation(s) within the areas targeted by this assay, and inadequate number of viral copies(<138 copies/mL). A negative result must be combined with clinical observations, patient history, and epidemiological information. The expected result is Negative.  Fact Sheet for Patients:  BloggerCourse.com  Fact Sheet for Healthcare Providers:  SeriousBroker.it  This test is no t yet approved or cleared by the [place] FDA and  has been authorized for detection and/or diagnosis of SARS-CoV-2 by FDA under an  Emergency Use Authorization (EUA). This EUA will remain  in effect (meaning this test can be used) for the duration of the COVID-19 declaration under Section 564(b)(1) of the Act, 21 U.S.C.section 360bbb-3(b)(1), unless the authorization is terminated  or revoked sooner.       Influenza A by PCR NEGATIVE NEGATIVE Final   Influenza B by PCR NEGATIVE NEGATIVE Final    Comment: (NOTE) The Xpert Xpress SARS-CoV-2/FLU/RSV plus assay is intended as an aid in the diagnosis of influenza from Nasopharyngeal swab  specimens and should not be used as a sole basis for treatment. Nasal washings and aspirates are unacceptable for Xpert Xpress SARS-CoV-2/FLU/RSV testing.  Fact Sheet for Patients: BloggerCourse.com  Fact Sheet for Healthcare Providers: SeriousBroker.it  This test is not yet approved or cleared by the [place] FDA and has been authorized for detection and/or diagnosis of SARS-CoV-2 by FDA under an Emergency Use Authorization (EUA). This EUA will remain in effect (meaning this test can be used) for the duration of the COVID-19 declaration under Section 564(b)(1) of the Act, 21 U.S.C. section 360bbb-3(b)(1), unless the authorization is terminated or revoked.  Performed at [facility], [address] [place], [place] [address]   MRSA Next Gen by PCR, Nasal     Status: None   Collection Time: 12/03/20  8:51 PM   Specimen: Nasal Mucosa; Nasal Swab  Result Value Ref Range Status   MRSA by PCR Next Gen NOT DETECTED NOT DETECTED Final    Comment: (NOTE) The GeneXpert MRSA Assay (FDA approved for NASAL specimens only), is one component of a comprehensive MRSA colonization surveillance program. It is not intended to diagnose MRSA infection nor to guide or monitor treatment for MRSA infections. Test performance is not FDA approved in patients less than 45 years old. Performed at [facility], [address] [place], [place] [address]   Blood culture (routine x 2)     Status: None   Collection Time: 12/03/20  9:00 PM   Specimen: BLOOD  Result Value Ref Range Status   Specimen Description BLOOD LEFT ASSIST CONTROL  Final   Special Requests   Final    BOTTLES DRAWN AEROBIC AND ANAEROBIC Blood Culture results may not be optimal due to an inadequate volume of blood received in culture bottles   Culture   Final    NO GROWTH 5 DAYS Performed at [facility], [address], [place], [place] [address]    Report Status 12/08/2020 FINAL  Final         Radiology Studies: No results found.      Scheduled Meds:  amLODipine  10 mg Oral Daily   amoxicillin-clavulanate  1 tablet Oral Q12H   B-complex with vitamin C  1 tablet Oral Daily   chlorhexidine  15 mL Mouth Rinse BID   dronabinol  5 mg Oral BID AC   enoxaparin (LOVENOX) injection  40 mg Subcutaneous Q24H   feeding supplement  237 mL Oral TID BM   fluconazole  100 mg Oral Daily   insulin aspart  0-15 Units Subcutaneous TID WC   insulin aspart  0-5 Units Subcutaneous QHS   insulin glargine-yfgn  8 Units Subcutaneous QHS   mouth rinse  15 mL Mouth Rinse q12n4p   megestrol  400 mg Oral Daily   methylphenidate  10 mg Oral BID WC   pantoprazole  40 mg Oral QPM   senna-docusate  3 tablet Oral QHS   tamsulosin  0.4 mg Oral QPC supper   Continuous Infusions:   LOS: 8 days    Time spent: 27 minutes    Marrion Coy, MD [facility]   To contact the attending provider between 7A-7P or the covering provider during after hours 7P-7A, please log into the web site [email or web address] and access using universal [place] password for that web site. If you do not have the password, please call the hospital operator.  12/11/2020, 11:00 AM

## 2020-12-12 ENCOUNTER — Encounter: Payer: PRIVATE HEALTH INSURANCE | Admitting: Physical Medicine and Rehabilitation

## 2020-12-12 LAB — CBC WITH DIFFERENTIAL/PLATELET
Abs Immature Granulocytes: 0.08 10*3/uL — ABNORMAL HIGH (ref 0.00–0.07)
Basophils Absolute: 0 10*3/uL (ref 0.0–0.1)
Basophils Relative: 0 %
Eosinophils Absolute: 0.2 10*3/uL (ref 0.0–0.5)
Eosinophils Relative: 2 %
HCT: 34 % — ABNORMAL LOW (ref 39.0–52.0)
Hemoglobin: 11.5 g/dL — ABNORMAL LOW (ref 13.0–17.0)
Immature Granulocytes: 1 %
Lymphocytes Relative: 26 %
Lymphs Abs: 2.2 10*3/uL (ref 0.7–4.0)
MCH: 29.4 pg (ref 26.0–34.0)
MCHC: 33.8 g/dL (ref 30.0–36.0)
MCV: 87 fL (ref 80.0–100.0)
Monocytes Absolute: 0.5 10*3/uL (ref 0.1–1.0)
Monocytes Relative: 6 %
Neutro Abs: 5.4 10*3/uL (ref 1.7–7.7)
Neutrophils Relative %: 65 %
Platelets: 364 10*3/uL (ref 150–400)
RBC: 3.91 MIL/uL — ABNORMAL LOW (ref 4.22–5.81)
RDW: 14.9 % (ref 11.5–15.5)
WBC: 8.4 10*3/uL (ref 4.0–10.5)
nRBC: 0 % (ref 0.0–0.2)

## 2020-12-12 LAB — BASIC METABOLIC PANEL
Anion gap: 10 (ref 5–15)
BUN: 14 mg/dL (ref 8–23)
CO2: 23 mmol/L (ref 22–32)
Calcium: 9.2 mg/dL (ref 8.9–10.3)
Chloride: 102 mmol/L (ref 98–111)
Creatinine, Ser: 0.75 mg/dL (ref 0.61–1.24)
GFR, Estimated: >60 mL/min (ref >60)
Glucose, Bld: 230 mg/dL — ABNORMAL HIGH (ref 70–99)
Potassium: 3.7 mmol/L (ref 3.5–5.1)
Sodium: 135 mmol/L (ref 135–145)

## 2020-12-12 LAB — PHOSPHORUS: Phosphorus: 2.7 mg/dL (ref 2.5–4.6)

## 2020-12-12 LAB — GLUCOSE, CAPILLARY
Glucose-Capillary: 208 mg/dL — ABNORMAL HIGH (ref 70–99)
Glucose-Capillary: 226 mg/dL — ABNORMAL HIGH (ref 70–99)
Glucose-Capillary: 207 mg/dL — ABNORMAL HIGH (ref 70–99)
Glucose-Capillary: 180 mg/dL — ABNORMAL HIGH (ref 70–99)

## 2020-12-12 LAB — MAGNESIUM: Magnesium: 2 mg/dL (ref 1.7–2.4)

## 2020-12-12 MED ORDER — ENSURE ENLIVE PO LIQD: 237.0000 mL | Freq: Three times a day (TID) | ORAL | 0 refills | Status: AC

## 2020-12-12 MED ORDER — MEGESTROL ACETATE 400 MG/10ML PO SUSP: 400.0000 mg | Freq: Every day | ORAL | 0 refills | Status: AC

## 2020-12-12 MED ORDER — DRONABINOL 5 MG PO CAPS: 5.0000 mg | ORAL_CAPSULE | Freq: Two times a day (BID) | ORAL | 0 refills | Status: AC

## 2020-12-12 MED ORDER — PANTOPRAZOLE SODIUM 40 MG PO TBEC: 40.0000 mg | DELAYED_RELEASE_TABLET | Freq: Every evening | ORAL | 0 refills | Status: AC

## 2020-12-12 NOTE — Progress Notes (Signed)
Occupational Therapy Treatment Patient Details Name: Raymond Kerr MRN: [id number] DOB: [birth year] Today's Date: 12/12/2020   History of present illness Raymond Kerr is a 65 y.o. 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   OT comments  Pt seen for OT tx session focused on RUE ROM to minimize contracture risk and improve comfort. Gentle PROM and stretching to RUE: elbow flexion/extenion, wrist flexion/extension, composite finger flextion/extension, and partial external rotation/internal rotation x48min. Attempted to use simple 2-word visual communication board to help pt indicate when the stretch was too much. Pt able to use appropriately/intentionally 2/5 opportunities. Pt progressing slowly towards goals. Continues to benefit from skilled OT services to maximize independence and minimize caregiver burden.    Recommendations for follow up therapy are one component of a multi-disciplinary discharge planning process, led by the attending physician.  Recommendations may be updated based on patient status, additional functional criteria and insurance authorization.    Follow Up Recommendations  Home health OT;Supervision/Assistance - 24 hour    Equipment Recommendations   (hoyer lift, power wc, hospital bed)    Recommendations for Other Services      Precautions / Restrictions Precautions Precautions: Fall Precaution Comments: R hemi, expressive difficulties Restrictions Weight Bearing Restrictions: No       Mobility Bed Mobility                    Transfers                      Balance                                           ADL  either performed or assessed with clinical judgement   ADL                                         General ADL Comments: Pt continues to require SETUP to MIN A (to reduce spillage) for hand to mouth for self feeding. Pt requires TOTAL A for LB ADLs.     Vision       Perception     Praxis      Cognition Arousal/Alertness: Awake/alert Behavior During Therapy: Flat affect Overall Cognitive Status: Difficult to assess                                 General Comments: expressive deficits attempting to verbalize words        Exercises Other Exercises Other Exercises: Gentle PROM and stretching to RUE: elbow flexion/extenion, wrist flexion/extension, composite finger flextion/extension, and partial external rotation/internal rotation x2min. Attempted to use simple 2-word visual communication board to help pt indicate when the stretch was too much.   Shoulder Instructions       General Comments      Pertinent Vitals/ Pain       Pain Assessment: Faces Pain Location: PRN wincing and intermittently able to say "yes" or  nod head to pain when asked Pain Descriptors / Indicators: Grimacing Pain Intervention(s): Limited activity within patient's tolerance;Monitored during session;Repositioned  Home Living                                          Prior Functioning/Environment              Frequency  Min 1X/week        Progress Toward Goals  OT Goals(current goals can now be found in the care plan section)  Progress towards OT goals: OT to reassess next treatment  Acute Rehab OT Goals Patient Stated Goal: unable to participate OT Goal Formulation: Patient unable to participate in goal setting Time For Goal Achievement: 12/19/20 Potential to Achieve Goals: Fair  Plan Discharge plan remains appropriate;Frequency remains appropriate    Co-evaluation                 AM-PAC OT "6 Clicks" Daily Activity      Outcome Measure   Help from another person eating meals?: A Little Help from another person taking care of personal grooming?: A Little Help from another person toileting, which includes using toliet, bedpan, or urinal?: Total Help from another person bathing (including washing, rinsing, drying)?: A Lot Help from another person to put on and taking off regular upper body clothing?: A Lot Help from another person to put on and taking off regular lower body clothing?: Total 6 Click Score: 12    End of Session    OT Visit Diagnosis: Muscle weakness (generalized) (M62.81);Other symptoms and signs involving the nervous system (R29.898);Hemiplegia and hemiparesis Hemiplegia - Right/Left: Right Hemiplegia - dominant/non-dominant: Dominant Hemiplegia - caused by: Cerebral infarction   Activity Tolerance Patient tolerated treatment well   Patient Left in bed;with call bell/phone within reach;with bed alarm set   Nurse Communication          Time: 0102-7253 OT Time Calculation (min): 13 min  Charges: OT General Charges $OT Visit: 1 Visit OT Treatments $Therapeutic Exercise: 8-22 mins  Arman Filter., MPH, MS, OTR/L ascom [phone] 12/12/20, 12:21 PM

## 2020-12-12 NOTE — Discharge Summary (Signed)
Physician Discharge Summary  Patient ID: Raymond Kerr MRN: [id number] DOB/AGE: 1955-05-10 65 y.o.  Admit date: 12/03/2020 Discharge date: 12/12/2020  Admission Diagnoses:  Discharge Diagnoses:  Active Problems:   Type 2 diabetes mellitus with hyperlipidemia (HCC)   Completed stroke (HCC)   Hypomagnesemia   Essential hypertension   Spastic hemiparesis of right dominant side as late effect of cerebrovascular disease (HCC)   Aphasia as late effect of cerebrovascular accident   Diverticulitis   Elevated liver function tests   Malnutrition of moderate degree   Decreased oral intake   Failure to thrive in adult   Anorexia   Discharged Condition: fair  Hospital Course:  65 year old man with type 2 diabetes mellitus GERD hypertension and a stroke that left him with right-sided paralysis and aphasia.  The patient was called back by the ER because of a positive blood culture.  They were treating diverticulitis.  The blood culture was a contamination.  Family concerned about his intake.  Calorie count showed that he was not keeping up with his nutritional needs.  Interventional radiology was consulted to place a PEG tube, they recommended Dobbhoff tube first.  But patient refused. Patient currently is off aspirin and Plavix, will need off aspirin for 5 days before PEG tube can be placed. Family eventually rejected PEG tube placement, I placed him on Marinol and megestrol, patient was able to start eating.  At this point, I will discharge patient home with this appetite stimulator.  Patient be followed with PCP in the near future.  Failure to thrive. Anorexia. Started megestrol and Marinol, appetite seem to be better. Patient is also seen by palliative care, family does not want hospice yet.  We will continue palliative care at home.  Also prescribed nutrition supplement.   Acute diverticulitis. Condition improved, antibiotics completed.  Oral thrush. Completed a course of  Diflucan.  Recent stroke with right-sided hemiparesis and aphasia. Essential hypertension. Resume aspirin and Plavix.  Resume statin.  Type 2 diabetes Resume oral diabetic medicines.  Amaryl will also be restarted.  Hypokalemia. Hypomagnesemia. Condition resolved     Consults: None  Significant Diagnostic Studies:   Treatments: Nutrition support, marinol  Discharge Exam: Blood pressure 135/78, pulse 91, temperature 98.3 F (36.8 C), resp. rate 18, height 6' (1.829 m), weight 104 kg, SpO2 99 %. General appearance: alert, cooperative, and aphasic Resp: clear to auscultation bilaterally Cardio: regular rate and rhythm, S1, S2 normal, no murmur, click, rub or gallop GI: soft, non-tender; bowel sounds normal; no masses,  no organomegaly Extremities: extremities normal, atraumatic, no cyanosis or edema  Disposition: Discharge disposition: 01-Home or Self Care       Discharge Instructions     Diet general   Complete by: As directed    Dysphagia 3 diet.   Increase activity slowly   Complete by: As directed       Allergies as of 12/12/2020   No Known Allergies      Medication List     STOP taking these medications    cefdinir 300 MG capsule Commonly known as: OMNICEF   glimepiride 2 MG tablet Commonly known as: AMARYL   metroNIDAZOLE 500 MG tablet Commonly known as: Flagyl       TAKE these medications    Accu-Chek Guide w/Device Kit Use as directed up to 4 times daily.   amLODipine 10 MG tablet Commonly known as: NORVASC Take 1 tablet (10 mg total) by mouth daily.   aspirin 81 MG chewable tablet Chew 1 tablet (  81 mg total) by mouth daily.   atorvastatin 80 MG tablet Commonly known as: LIPITOR Take 1 tablet (80 mg total) by mouth daily.   blood glucose meter kit and supplies Dispense based on patient and insurance preference. Use up to four times daily as directed. (FOR ICD-10 E10.9, E11.9).   clopidogrel 75 MG tablet Commonly known as:  PLAVIX Take 1 tablet (75 mg total) by mouth daily.   dronabinol 5 MG capsule Commonly known as: MARINOL Take 1 capsule (5 mg total) by mouth 2 (two) times daily before lunch and supper.   feeding supplement Liqd Take 237 mLs by mouth 3 (three) times daily between meals for 20 days.   megestrol 400 MG/10ML suspension Commonly known as: MEGACE Take 10 mLs (400 mg total) by mouth daily. Start taking on: December 13, 2020   metFORMIN 500 MG tablet Commonly known as: GLUCOPHAGE Take 1 tablet (500 mg total) by mouth 2 (two) times daily with a meal.   methylphenidate 10 MG tablet Commonly known as: RITALIN Take 1 tablet (10 mg total) by mouth 2 (two) times daily with breakfast and lunch.   pantoprazole 40 MG tablet Commonly known as: PROTONIX Take 1 tablet (40 mg total) by mouth every evening.   pioglitazone 30 MG tablet Commonly known as: ACTOS Take 1 tablet by mouth daily.   polyethylene glycol 17 g packet Commonly known as: MIRALAX / GLYCOLAX Take 17 g by mouth 2 (two) times daily.   tamsulosin 0.4 MG Caps capsule Commonly known as: FLOMAX Take 1 capsule (0.4 mg total) by mouth daily after supper.   traZODone 50 MG tablet Commonly known as: DESYREL Take 0.5 tablets (25 mg total) by mouth at bedtime as needed for sleep.   Vitamin B Complex Tabs Take 1 tablet by mouth daily.   Vitamin D (Ergocalciferol) 1.25 MG (50000 UNIT) Caps capsule Commonly known as: DRISDOL Take 1 capsule (50,000 Units total) by mouth once a week.        Follow-up Information     Tracie Harrier, MD Follow up in 1 week(s).   Specialty: Internal Medicine Contact information: [place] [address] [phone]                32 minutes Signed: Sharen Hones 12/12/2020, 11:45 AM

## 2020-12-12 NOTE — TOC Progression Note (Signed)
Transition of Care [facility]) - Progression Note    Patient Details  Name: Raymond Kerr MRN: [id number] Date of Birth: 10/17/1955  [facility] CM/SW Contact  Caryn Section, RN Phone Number: 12/12/2020, 3:30 PM  Clinical Narrative:   Patient has no active insurance, contacted [facility], who is unable to accept patient due to the discharge including more than home health can accommodate in such a setting, and thus they do not feel this will be safe for patient or home health team.    Notified [employer] of charity hoyer lift needed.  Zac from [employer] is looking into charity hoyer as patient is +2 to move at this time.  Zac will notify TOC if [employer] is able to set up hoyer.   Contacted daughter [name], who is patient's caregiver at home.  [name] stated that she would not be back to the hospital today and that she has a hospital bed and does not know about pads for the bed.  Asked [name] to specify, but her request remains unclear.  Daughter states this is the only equipment she has for patient.  It is unclear how patient will be transported to follow up appointments at this time.  Resources given for [facility].   Patient will need to be transported home by EMS when discharged, as he is not able to be transported by personal vehicle.  Daughter [name] states that she is unsure of patient's medicaid status at this time and that "nobody helped patient to apply for Medicare, so he does not have it."    Left message for [name] to discuss discharge further, Dr. Chipper Herb notified of complex discharge situation, will hold discharge for now.  TOC to follow    Expected Discharge Plan: Home w Home Health Services Barriers to Discharge: Continued Medical Work up  Expected Discharge Plan and Services Expected Discharge Plan: Home w Home Health Services In-house Referral: Nutrition, Chaplain, Artist (Nuturition for calorie count, Chaplain for care decisions, financial  counselor for mediciad status) Discharge Planning Services: CM Consult Post Acute Care Choice:  (TBD) Living arrangements for the past 2 months: Single Family Home Expected Discharge Date: 12/12/20                 DME Agency:  (TBD)       HH Arranged:  (TBD)           Social Determinants of Health (SDOH) Interventions    Readmission Risk Interventions No flowsheet data found.

## 2020-12-12 NOTE — Progress Notes (Signed)
Daily Progress Note   Patient Name: Raymond Kerr       Date: 12/12/2020 DOB: Oct 16, 1955  Age: 65 y.o. MRN#: [id number] Attending Physician: Raymond Coy, MD Primary Care Physician: Raymond Reichmann, MD Admit Date: 12/03/2020  Reason for Consultation/Follow-up: Establishing goals of care  Subjective: Per nursing, patient is eating small amounts, but not nearly enough to sustain him. Per primary team, family is planning to take patient home.  We discussed his diagnosis, prognosis, GOC, EOL wishes disposition and options.  Created space and opportunity for patient  to explore thoughts and feelings regarding current medical information.   A detailed discussion was had today regarding advanced directives.  Concepts specific to code status, artifical feeding and hydration, IV antibiotics and rehospitalization were discussed.  The difference between an aggressive medical intervention path and a comfort care path was discussed.  Values and goals of care important to patient and family were attempted to be elicited.  Discussed limitations of medical interventions to prolong quality of life in some situations and discussed the concept of human mortality.  Reviewed her understanding of his status. She is hopeful for improvement as his stroke was a few months ago. We discussed "what -ifs". We discussed acceptable QOL in great detail.  She voices reasons he is not eating such as not feeling well- and voices concerns the staff is not feeding him. Raymond Kerr states she is unsure of her wishes for him to return to the hospital. She states she would not want a feeding tube placed. She states she would like him to remain a full code.    Recommend home with palliative.    Length of Stay: 9  Current  Medications: Scheduled Meds:   amLODipine  10 mg Oral Daily   B-complex with vitamin C  1 tablet Oral Daily   chlorhexidine  15 mL Mouth Rinse BID   dronabinol  5 mg Oral BID AC   enoxaparin (LOVENOX) injection  40 mg Subcutaneous Q24H   feeding supplement  237 mL Oral TID BM   insulin aspart  0-15 Units Subcutaneous TID WC   insulin aspart  0-5 Units Subcutaneous QHS   insulin glargine-yfgn  12 Units Subcutaneous QHS   mouth rinse  15 mL Mouth Rinse q12n4p   megestrol  400 mg Oral Daily   methylphenidate  10  mg Oral BID WC   pantoprazole  40 mg Oral QPM   senna-docusate  3 tablet Oral QHS   tamsulosin  0.4 mg Oral QPC supper    Continuous Infusions:   PRN Meds: acetaminophen **OR** acetaminophen, ondansetron **OR** ondansetron (ZOFRAN) IV, traZODone  Physical Exam Pulmonary:     Effort: Pulmonary effort is normal.  Neurological:     Mental Status: He is alert.            Vital Signs: BP 135/78 (BP Location: Right Arm)   Pulse 91   Temp 98.3 F (36.8 C)   Resp 18   Ht 6' (1.829 m)   Wt 104 kg   SpO2 99%   BMI 31.10 kg/m  SpO2: SpO2: 99 % O2 Device: O2 Device: Room Air O2 Flow Rate: O2 Flow Rate (L/min): 0 L/min  Intake/output summary: No intake or output data in the 24 hours ending 12/12/20 0956 LBM: Last BM Date: 12/09/20 Baseline Weight: Weight: 102.7 kg Most recent weight: Weight: 104 kg   Flowsheet Rows    Flowsheet Row Most Recent Value  Intake Tab   Referral Department Hospitalist  Unit at Time of Referral Med/Surg Unit  Palliative Care Primary Diagnosis Sepsis/Infectious Disease  Date Notified 12/05/20  Palliative Care Type New Palliative care  Reason for referral Clarify Goals of Care  Date of Admission 12/03/20  Date first seen by Palliative Care 12/05/20  # of days Palliative referral response time 0 Day(s)  # of days IP prior to Palliative referral 2  Clinical Assessment   Palliative Performance Scale Score 30%  Pain Max last 24 hours Not  able to report  Pain Min Last 24 hours Not able to report  Dyspnea Max Last 24 Hours Not able to report  Dyspnea Min Last 24 hours Not able to report  Psychosocial & Spiritual Assessment   Palliative Care Outcomes        Patient Active Problem List   Diagnosis Date Noted   Anorexia 12/10/2020   Failure to thrive in adult    Malnutrition of moderate degree 12/06/2020   Decreased oral intake    Elevated liver function tests    Thrush    Benign prostatic hyperplasia without lower urinary tract symptoms    Bacteremia due to Staphylococcus 12/03/2020   Aphasia as late effect of cerebrovascular accident 12/03/2020   Diverticulitis 12/03/2020   Essential hypertension    Spastic hemiparesis of right dominant side as late effect of cerebrovascular disease (HCC)    Uncontrolled type 2 diabetes mellitus with hyperglycemia (HCC)    Left thalamic infarction (HCC) 10/24/2020   Hypotension 10/19/2020   Hypokalemia 10/17/2020   Hypomagnesemia 10/17/2020   Hypophosphatemia 10/17/2020   Pressure injury of skin 10/16/2020   Completed stroke (HCC)    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    Code Status:    Code Status Orders  (From admission, onward)           Start     Ordered   12/03/20 2139  Full code  Continuous  12/03/20 2141           Code Status History     Date Active Date Inactive Code Status Order ID Comments User Context   10/24/2020 1646 11/26/2020 0141 Full Code [id number]  Raymond Kerr Inpatient    10/24/2020 1646 10/24/2020 1646 Full Code [id number]  Raymond Kerr Inpatient   10/06/2020 0233 10/24/2020 1644 Full Code [id number]  Mansy, Vernetta Honey, MD ED       Prognosis:  Unable to determine    Thank you for allowing the Palliative Medicine Team to assist in the care of this patient.   Time In: 10:20 Time Out: 11:30 Total Time 70 min Prolonged Time Billed  yes       Greater than 50%  of this time was spent counseling and coordinating care related to the above assessment and plan.  Morton Stall, NP  Please contact Palliative Medicine Team phone at [phone] for questions and concerns.

## 2020-12-12 NOTE — TOC Progression Note (Addendum)
Transition of Care [facility]) - Progression Note    Patient Details  Name: Raymond Kerr MRN: [id number] Date of Birth: Jun 01, 1955  [facility] CM/SW Contact  Caryn Section, RN Phone Number: 12/12/2020, 4:00 PM  Clinical Narrative:   Spoke to patient's daughter, [name] again.  She states she feels comfortable taking care of her father with a hoyer lift to assist them and daughter is aware father is not eligible for [facility] at this time.  [name] verbalized understanding of this.  Zac from [employer] still working to get patient and family a hoyer.  Will contact TOC.    [facility] Sites will most likely not be set up until tomorrow.  Dr Chipper Herb okay with discharging tomorrow.  TOC to follow.  Addendum:  As per Zac at [employer], [facility] will be set up tomorrow at patient's home.  Expected Discharge Plan: Home w Home Health Services Barriers to Discharge: Continued Medical Work up  Expected Discharge Plan and Services Expected Discharge Plan: Home w Home Health Services In-house Referral: Nutrition, Chaplain, Artist (Nuturition for calorie count, Chaplain for care decisions, financial counselor for mediciad status) Discharge Planning Services: CM Consult Post Acute Care Choice:  (TBD) Living arrangements for the past 2 months: Single Family Home Expected Discharge Date: 12/12/20                 DME Agency:  (TBD)       HH Arranged:  (TBD)           Social Determinants of Health (SDOH) Interventions    Readmission Risk Interventions No flowsheet data found.

## 2020-12-12 NOTE — Progress Notes (Signed)
Physical Therapy Treatment Patient Details Name: Raymond Kerr MRN: [id number] DOB: 1955/12/23 Today's Date: 12/12/2020   History of Present Illness Raymond Kerr is a 65 y.o. 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    PT Comments    Pt seen this pm at bedside. Able to answer yes/no questions and laughed at a joke. Focused session on passive stretch and ROM to R LE and AAROM L LE. Gentle stretching to R UE to prevent contractures.  Pt positioned to promote skin integrity. Educated pt on discharge plans hopefully in the next day or so to daughter's house. Pt spoke back "yes".  At length discussion with CM to provide pt and daughter with all DME needs and assist as much as possible with safe d/c home. Daughter states she was educated on pt's care/needs while he was at acute rehab and feels competent caring for her dad. Continue PT per POC until d/c.   Recommendations for follow up therapy are one component of a multi-disciplinary discharge planning process, led by the attending physician.  Recommendations may be updated based on patient status, additional functional criteria and insurance authorization.  Follow Up Recommendations  Home health PT;Supervision/Assistance - 24 hour     Equipment Recommendations  Wheelchair (measurements PT);Wheelchair cushion (measurements PT);Other (comment)    Recommendations for Other Services       Precautions / Restrictions Precautions Precautions: Fall Precaution Comments: R hemi, expressive difficulties Restrictions Weight Bearing Restrictions: No     Mobility  Bed Mobility                    Transfers                  General transfer comment:  (Total Assist, Nurse, adult)  Ambulation/Gait                 Stairs             Wheelchair Mobility    Modified Rankin (Stroke Patients Only)       Balance                                            Cognition Arousal/Alertness: Awake/alert Behavior During Therapy: Flat affect (Did laugh during conversation) Overall Cognitive Status: Difficult to assess                                 General Comments: expressive deficits attempting to verbalize words      Exercises Other Exercises Other Exercises:  (PROM R UE to prevent shoulder/elbow contracture) Other Exercises:  (R LE PROM through resistance and tone, all available planes x 10-15 reps. Pt with noted tightness and grimmacing during L hip flexion and IR)    General Comments General comments (skin integrity, edema, etc.): No heel sores noted or skin tears on B LE's.      Pertinent Vitals/Pain Pain Assessment: Faces Pain Descriptors / Indicators: Grimacing (during passive stretching R LE) Pain Intervention(s): Limited activity within patient's tolerance;Monitored during session;Utilized relaxation techniques  Home Living                      Prior Function            PT Goals (current goals can now be found in the care plan section) Acute Rehab PT Goals Patient Stated Goal: unable to participate    Frequency    Min 2X/week      PT Plan Current plan remains appropriate    Co-evaluation              AM-PAC PT "6 Clicks" Mobility   Outcome Measure  Help needed turning from your back to your side while in a flat bed without using bedrails?: Total Help needed moving from lying on your back to sitting on the side of a flat bed without using bedrails?: Total Help needed moving to and from a bed to a chair (including a wheelchair)?: Total Help needed standing up from a chair using your arms (e.g., wheelchair or bedside  chair)?: Total Help needed to walk in hospital room?: Total Help needed climbing 3-5 steps with a railing? : Total 6 Click Score: 6    End of Session   Activity Tolerance: Patient tolerated treatment well Patient left: in bed;with call bell/phone within reach;with bed alarm set Nurse Communication: Mobility status PT Visit Diagnosis: Unsteadiness on feet (R26.81);Muscle weakness (generalized) (M62.81);Hemiplegia and hemiparesis Hemiplegia - Right/Left: Right Hemiplegia - dominant/non-dominant: Dominant     Time: 1415-9733 PT Time Calculation (min) (ACUTE ONLY): 25 min  Charges:  $Therapeutic Exercise: 8-22 mins $Therapeutic Activity: 8-22 mins                    Zadie Cleverly, PTA    Jannet Askew 12/12/2020, 5:33 PM

## 2020-12-13 LAB — GLUCOSE, CAPILLARY
Glucose-Capillary: 152 mg/dL — ABNORMAL HIGH (ref 70–99)
Glucose-Capillary: 159 mg/dL — ABNORMAL HIGH (ref 70–99)
Glucose-Capillary: 177 mg/dL — ABNORMAL HIGH (ref 70–99)

## 2020-12-13 NOTE — Discharge Summary (Signed)
Physician Discharge Summary  Patient ID: Raymond Kerr MRN: [id number] DOB/AGE: 1955/07/07 65 y.o.  Admit date: 12/03/2020 Discharge date: 12/13/2020  Admission Diagnoses:  Discharge Diagnoses:  Active Problems:   Type 2 diabetes mellitus with hyperlipidemia (HCC)   Completed stroke (HCC)   Hypomagnesemia   Essential hypertension   Spastic hemiparesis of right dominant side as late effect of cerebrovascular disease (HCC)   Aphasia as late effect of cerebrovascular accident   Diverticulitis   Elevated liver function tests   Malnutrition of moderate degree   Decreased oral intake   Failure to thrive in adult   Anorexia   Discharged Condition: fair  Hospital Course:  65 year old man with type 2 diabetes mellitus GERD hypertension and a stroke that left him with right-sided paralysis and aphasia.  The patient was called back by the ER because of a positive blood culture.  They were treating diverticulitis.  The blood culture was a contamination.  Family concerned about his intake.  Calorie count showed that he was not keeping up with his nutritional needs.  Interventional radiology was consulted to place a PEG tube, they recommended Dobbhoff tube first.  But patient refused. Patient currently is off aspirin and Plavix, will need off aspirin for 5 days before PEG tube can be placed. Family eventually rejected PEG tube placement, I placed him on Marinol and megestrol, patient was able to start eating.  At this point, I will discharge patient home with this appetite stimulator.  Patient be followed with PCP in the near future.   Failure to thrive. Anorexia. Started megestrol and Marinol, appetite seem to be better. Patient is also seen by palliative care, family does not want hospice yet.  We will continue palliative care at home.  Also prescribed nutrition supplement.   Acute diverticulitis. Condition improved, antibiotics completed.  Oral thrush. Completed a course of  Diflucan.  Recent stroke with right-sided hemiparesis and aphasia. Essential hypertension. Resume aspirin and Plavix.  Resume statin.  Type 2 diabetes Resume oral diabetic medicines.  Amaryl will also be restarted.  Hypokalemia. Hypomagnesemia. Condition resolved   Patient was not discharged yesterday due to the fact that the family requested a Hoyer lift before discharge.  Case management had determined that it was unsafe to discharge patient yesterday.  Patient will be discharged today with the plan [facility] lift with delivery today.    Consults: None  Significant Diagnostic Studies:   Treatments:   Discharge Exam: Blood pressure (!) 156/93, pulse 94, temperature 97.7 F (36.5 C), temperature source Oral, resp. rate 16, height 6' (1.829 m), weight 104 kg, SpO2 100 %. General appearance: alert and aphasic Resp: clear to auscultation bilaterally Cardio: regular rate and rhythm, S1, S2 normal, no murmur, click, rub or gallop GI: soft, non-tender; bowel sounds normal; no masses,  no organomegaly Extremities: extremities normal, atraumatic, no cyanosis or edema  Disposition: Discharge disposition: 01-Home or Self Care       Discharge Instructions     Diet general   Complete by: As directed    Dysphagia 3 diet.   Increase activity slowly   Complete by: As directed       Allergies as of 12/13/2020   No Known Allergies      Medication List     STOP taking these medications    cefdinir 300 MG capsule Commonly known as: OMNICEF   metroNIDAZOLE 500 MG tablet Commonly known as: Flagyl       TAKE these medications    Accu-Chek Guide w/Device  Kit Use as directed up to 4 times daily.   amLODipine 10 MG tablet Commonly known as: NORVASC Take 1 tablet (10 mg total) by mouth daily.   aspirin 81 MG chewable tablet Chew 1 tablet (81 mg total) by mouth daily.   atorvastatin 80 MG tablet Commonly known as: LIPITOR Take 1 tablet (80 mg total) by mouth  daily.   blood glucose meter kit and supplies Dispense based on patient and insurance preference. Use up to four times daily as directed. (FOR ICD-10 E10.9, E11.9).   clopidogrel 75 MG tablet Commonly known as: PLAVIX Take 1 tablet (75 mg total) by mouth daily.   dronabinol 5 MG capsule Commonly known as: MARINOL Take 1 capsule (5 mg total) by mouth 2 (two) times daily before lunch and supper.   feeding supplement Liqd Take 237 mLs by mouth 3 (three) times daily between meals for 20 days.   glimepiride 2 MG tablet Commonly known as: AMARYL Take 1 tablet (2 mg total) by mouth daily with breakfast.   megestrol 400 MG/10ML suspension Commonly known as: MEGACE Take 10 mLs (400 mg total) by mouth daily.   metFORMIN 500 MG tablet Commonly known as: GLUCOPHAGE Take 1 tablet (500 mg total) by mouth 2 (two) times daily with a meal.   methylphenidate 10 MG tablet Commonly known as: RITALIN Take 1 tablet (10 mg total) by mouth 2 (two) times daily with breakfast and lunch.   pantoprazole 40 MG tablet Commonly known as: PROTONIX Take 1 tablet (40 mg total) by mouth every evening.   pioglitazone 30 MG tablet Commonly known as: ACTOS Take 1 tablet by mouth daily.   polyethylene glycol 17 g packet Commonly known as: MIRALAX / GLYCOLAX Take 17 g by mouth 2 (two) times daily.   tamsulosin 0.4 MG Caps capsule Commonly known as: FLOMAX Take 1 capsule (0.4 mg total) by mouth daily after supper.   traZODone 50 MG tablet Commonly known as: DESYREL Take 0.5 tablets (25 mg total) by mouth at bedtime as needed for sleep.   Vitamin B Complex Tabs Take 1 tablet by mouth daily.   Vitamin D (Ergocalciferol) 1.25 MG (50000 UNIT) Caps capsule Commonly known as: DRISDOL Take 1 capsule (50,000 Units total) by mouth once a week.               Durable Medical Equipment  (From admission, onward)           Start     Ordered   12/12/20 1514  For home use only DME Other see comment   Once       Comments: Patient has hospital bed needs hoyer lift as patient is a +2 lift  Question:  Length of Need  Answer:  12 Months   12/12/20 1515            Follow-up Information     Tracie Harrier, MD. Go on 12/18/2020.   Specialty: Internal Medicine Why: _0 :00am with Rob Tumbey(PA) Contact information: [place] [address] [phone]                 Signed: Sharen Hones 12/13/2020, 1:14 PM

## 2020-12-13 NOTE — Progress Notes (Signed)
Patient OOF in stable condition via EMS. Daughter [name] notified.

## 2020-12-13 NOTE — TOC Progression Note (Signed)
Transition of Care [facility]) - Progression Note    Patient Details  Name: Raymond Kerr MRN: [id number] Date of Birth: 06/13/1955  [facility] of [facility]) CM/SW Contact  Susa Simmonds, [place] Phone Number: 12/13/2020, 10:02 AM  Clinical Narrative:   CSW spoke with Velna Hatchet from [employer] who stated she will call CSW when the hoyer lift has been assigned a driver to deliver to the home.     Expected Discharge Plan: Home w Home Health Services Barriers to Discharge: Continued Medical Work up  Expected Discharge Plan and Services Expected Discharge Plan: Home w Home Health Services In-house Referral: Nutrition, Chaplain, Artist (Nuturition for calorie count, Chaplain for care decisions, financial counselor for mediciad status) Discharge Planning Services: CM Consult Post Acute Care Choice:  (TBD) Living arrangements for the past 2 months: Single Family Home Expected Discharge Date: 12/12/20                 DME Agency:  (TBD)       HH Arranged:  (TBD)           Social Determinants of Health (SDOH) Interventions    Readmission Risk Interventions No flowsheet data found.

## 2020-12-13 NOTE — TOC Transition Note (Signed)
Transition of Care [facility]) - CM/SW Discharge Note   Patient Details  Name: Ladarion Munyon MRN: [id number] Date of Birth: Mar 15, 1955  Transition of Care [facility]) CM/SW Contact:  Susa Simmonds, LCSWA Phone Number: 12/13/2020, 1:41 PM   Clinical Narrative:   Transportation was contacted.     Final next level of care:  (TBD) Barriers to Discharge: Continued Medical Work up   Patient Goals and CMS Choice     Choice offered to / list presented to :  (TBD)  Discharge Placement                       Discharge Plan and Services In-house Referral: Nutrition, Chaplain, Artist (Nuturition for calorie count, Chaplain for care decisions, Artist for [employer] status) Discharge Planning Services: CM Consult Post Acute Care Choice:  (TBD)            DME Agency:  (TBD)       HH Arranged:  (TBD)          Social Determinants of Health (SDOH) Interventions     Readmission Risk Interventions No flowsheet data found.

## 2020-12-17 ENCOUNTER — Telehealth: Payer: Self-pay

## 2020-12-17 NOTE — Telephone Encounter (Signed)
Attempted to contact patient's daughter [name] to schedule a Palliative Care consult appointment. No answer left a message to return call.

## 2020-12-22 ENCOUNTER — Telehealth: Payer: Self-pay | Admitting: Primary Care

## 2020-12-22 NOTE — Telephone Encounter (Signed)
Attempted to contact patient's daughter, Kaleo Condrey, to offer to schedule a Palliative Care Consult in the home, no answer - left message regarding reason for call along with my name and call back number

## 2020-12-30 ENCOUNTER — Ambulatory Visit: Payer: Self-pay | Admitting: Gastroenterology

## 2020-12-30 ENCOUNTER — Telehealth: Payer: Self-pay | Admitting: Primary Care

## 2020-12-30 NOTE — Telephone Encounter (Signed)
Attempted to contact daughter  Krue Peterka, to schedule Palliative Consult, no answer - left message requesting a return call by Friday 01/02/21 to let us know if do or do not wish to pursue Palliative services for the patient, and if I do not receive a return call by Fri. that I would cancel the referral and notify Dr. Eston Esters office.

## 2021-01-05 ENCOUNTER — Encounter: Payer: Self-pay | Admitting: Physician Assistant

## 2021-01-07 ENCOUNTER — Other Ambulatory Visit: Payer: Self-pay

## 2021-01-07 DIAGNOSIS — Z7982 Long term (current) use of aspirin: Secondary | ICD-10-CM | POA: Diagnosis not present

## 2021-01-07 DIAGNOSIS — E119 Type 2 diabetes mellitus without complications: Secondary | ICD-10-CM | POA: Insufficient documentation

## 2021-01-07 DIAGNOSIS — M79604 Pain in right leg: Secondary | ICD-10-CM | POA: Insufficient documentation

## 2021-01-07 DIAGNOSIS — Z7984 Long term (current) use of oral hypoglycemic drugs: Secondary | ICD-10-CM | POA: Insufficient documentation

## 2021-01-07 DIAGNOSIS — Z79899 Other long term (current) drug therapy: Secondary | ICD-10-CM | POA: Insufficient documentation

## 2021-01-07 DIAGNOSIS — M79601 Pain in right arm: Secondary | ICD-10-CM | POA: Insufficient documentation

## 2021-01-07 DIAGNOSIS — I10 Essential (primary) hypertension: Secondary | ICD-10-CM | POA: Diagnosis not present

## 2021-01-07 DIAGNOSIS — M25551 Pain in right hip: Secondary | ICD-10-CM | POA: Diagnosis not present

## 2021-01-07 LAB — COMPREHENSIVE METABOLIC PANEL
ALT: 62 U/L — ABNORMAL HIGH (ref 0–44)
AST: 84 U/L — ABNORMAL HIGH (ref 15–41)
Albumin: 3.2 g/dL — ABNORMAL LOW (ref 3.5–5.0)
Alkaline Phosphatase: 69 U/L (ref 38–126)
Anion gap: 10 (ref 5–15)
BUN: 11 mg/dL (ref 8–23)
CO2: 19 mmol/L — ABNORMAL LOW (ref 22–32)
Calcium: 9.2 mg/dL (ref 8.9–10.3)
Chloride: 107 mmol/L (ref 98–111)
Creatinine, Ser: 0.82 mg/dL (ref 0.61–1.24)
GFR, Estimated: >60 mL/min (ref >60)
Glucose, Bld: 163 mg/dL — ABNORMAL HIGH (ref 70–99)
Potassium: 3.5 mmol/L (ref 3.5–5.1)
Sodium: 136 mmol/L (ref 135–145)
Total Bilirubin: 1.2 mg/dL (ref 0.3–1.2)
Total Protein: 7.2 g/dL (ref 6.5–8.1)

## 2021-01-07 LAB — CBC
HCT: 38.4 % — ABNORMAL LOW (ref 39.0–52.0)
Hemoglobin: 12.8 g/dL — ABNORMAL LOW (ref 13.0–17.0)
MCH: 28.7 pg (ref 26.0–34.0)
MCHC: 33.3 g/dL (ref 30.0–36.0)
MCV: 86.1 fL (ref 80.0–100.0)
Platelets: 323 10*3/uL (ref 150–400)
RBC: 4.46 MIL/uL (ref 4.22–5.81)
RDW: 14.9 % (ref 11.5–15.5)
WBC: 6.7 10*3/uL (ref 4.0–10.5)
nRBC: 0 % (ref 0.0–0.2)

## 2021-01-07 NOTE — ED Triage Notes (Signed)
Pt comes into the ED via EMS from home, pt is bed bound, nonverbal due to previous stroke, C/O right arm and leg pain.  162/77 109HR 98%RA

## 2021-01-07 NOTE — ED Triage Notes (Signed)
According to first nurse note pt was brought in by EMS from home c/o right arm and leg pain.  Pt is non verbal d/t previous stroke, patient alert and responsive to voice but unable to answer questions or follow many commands.  Pt resting comfortably in hospital bed in triage, chest rise even and unlabored, in NAD at this time.

## 2021-01-08 ENCOUNTER — Emergency Department
Admission: EM | Admit: 2021-01-08 | Discharge: 2021-01-12 | Disposition: A | Discharge status: Home / Self Care | Payer: Medicaid Other | Attending: Emergency Medicine | Admitting: Emergency Medicine

## 2021-01-08 ENCOUNTER — Emergency Department: Payer: Medicaid Other

## 2021-01-08 DIAGNOSIS — M79601 Pain in right arm: Secondary | ICD-10-CM

## 2021-01-08 DIAGNOSIS — R52 Pain, unspecified: Secondary | ICD-10-CM

## 2021-01-08 DIAGNOSIS — M25551 Pain in right hip: Secondary | ICD-10-CM

## 2021-01-08 IMAGING — CR DG HUMERUS 2V *R*
2 series · 2 of 2 positions shown · non-contrast
Comparison: Right shoulder series today.

CLINICAL DATA: 64-year-old male with pain.

EXAM:
RIGHT HUMERUS - 2+ VIEW

[humerus ap]
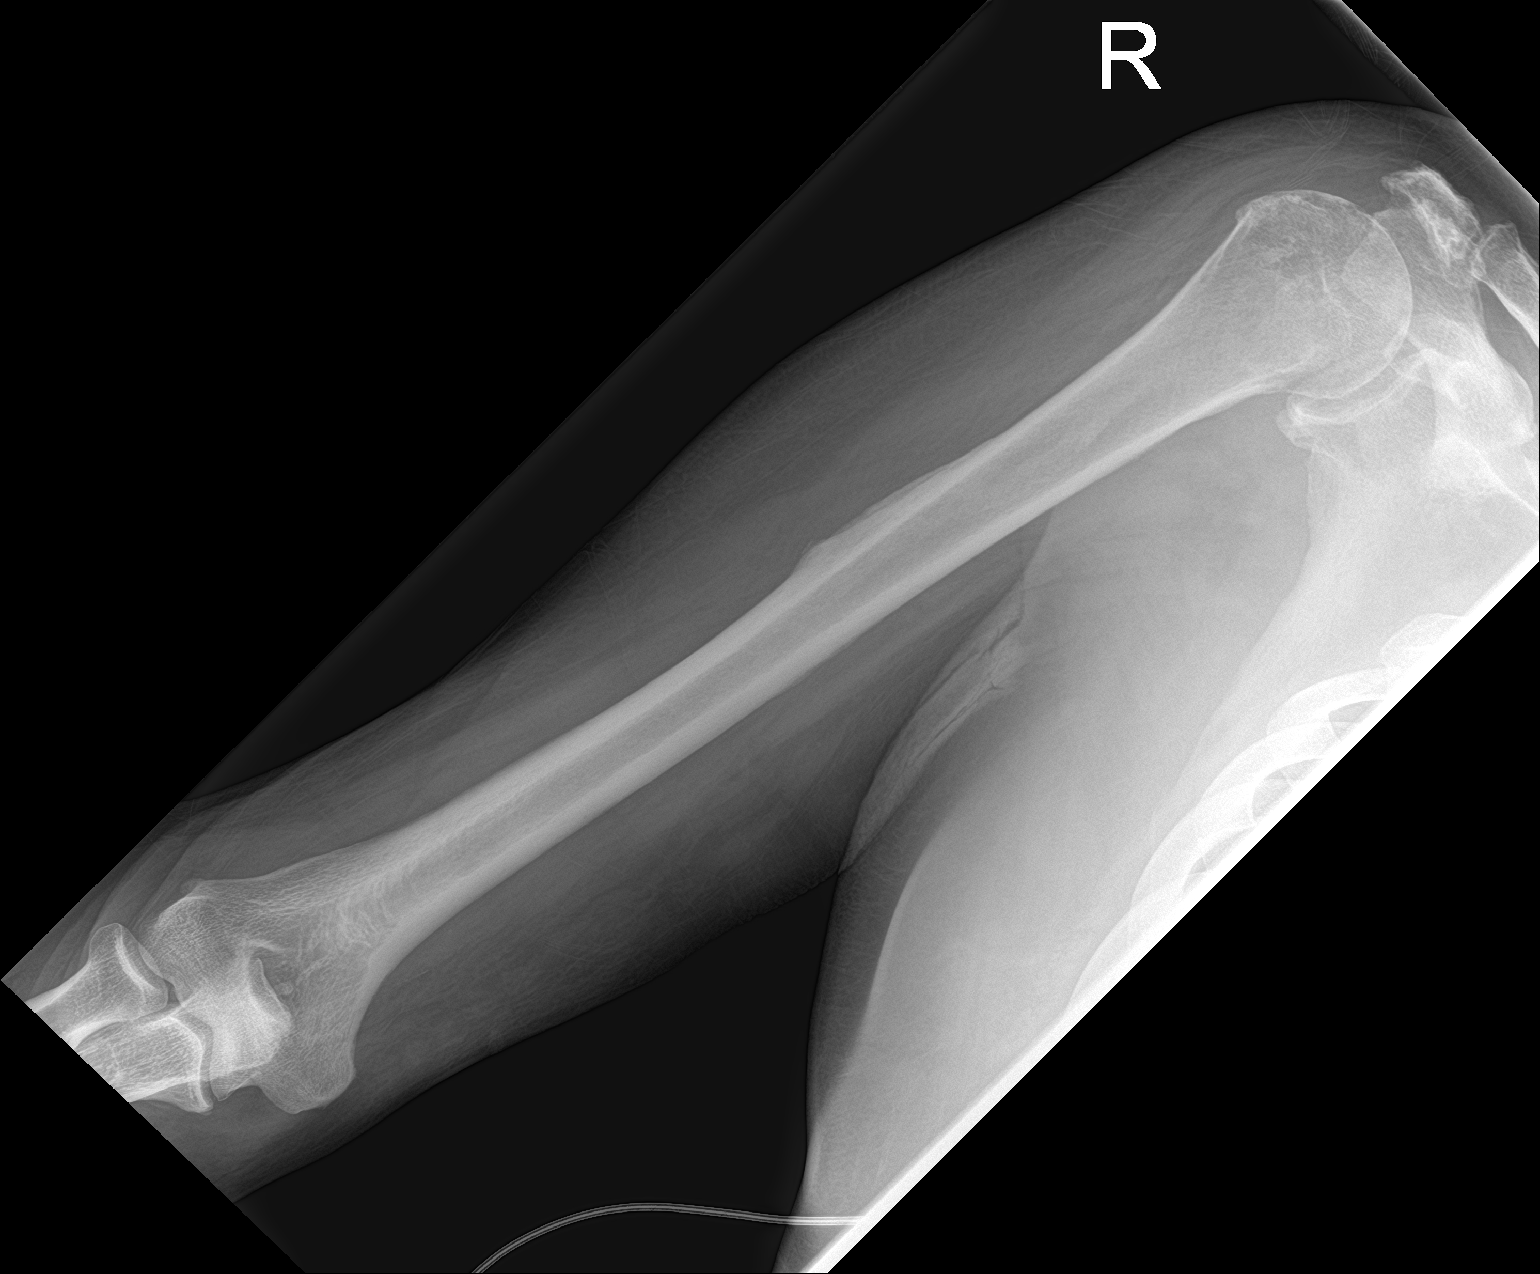

[humerus lat]
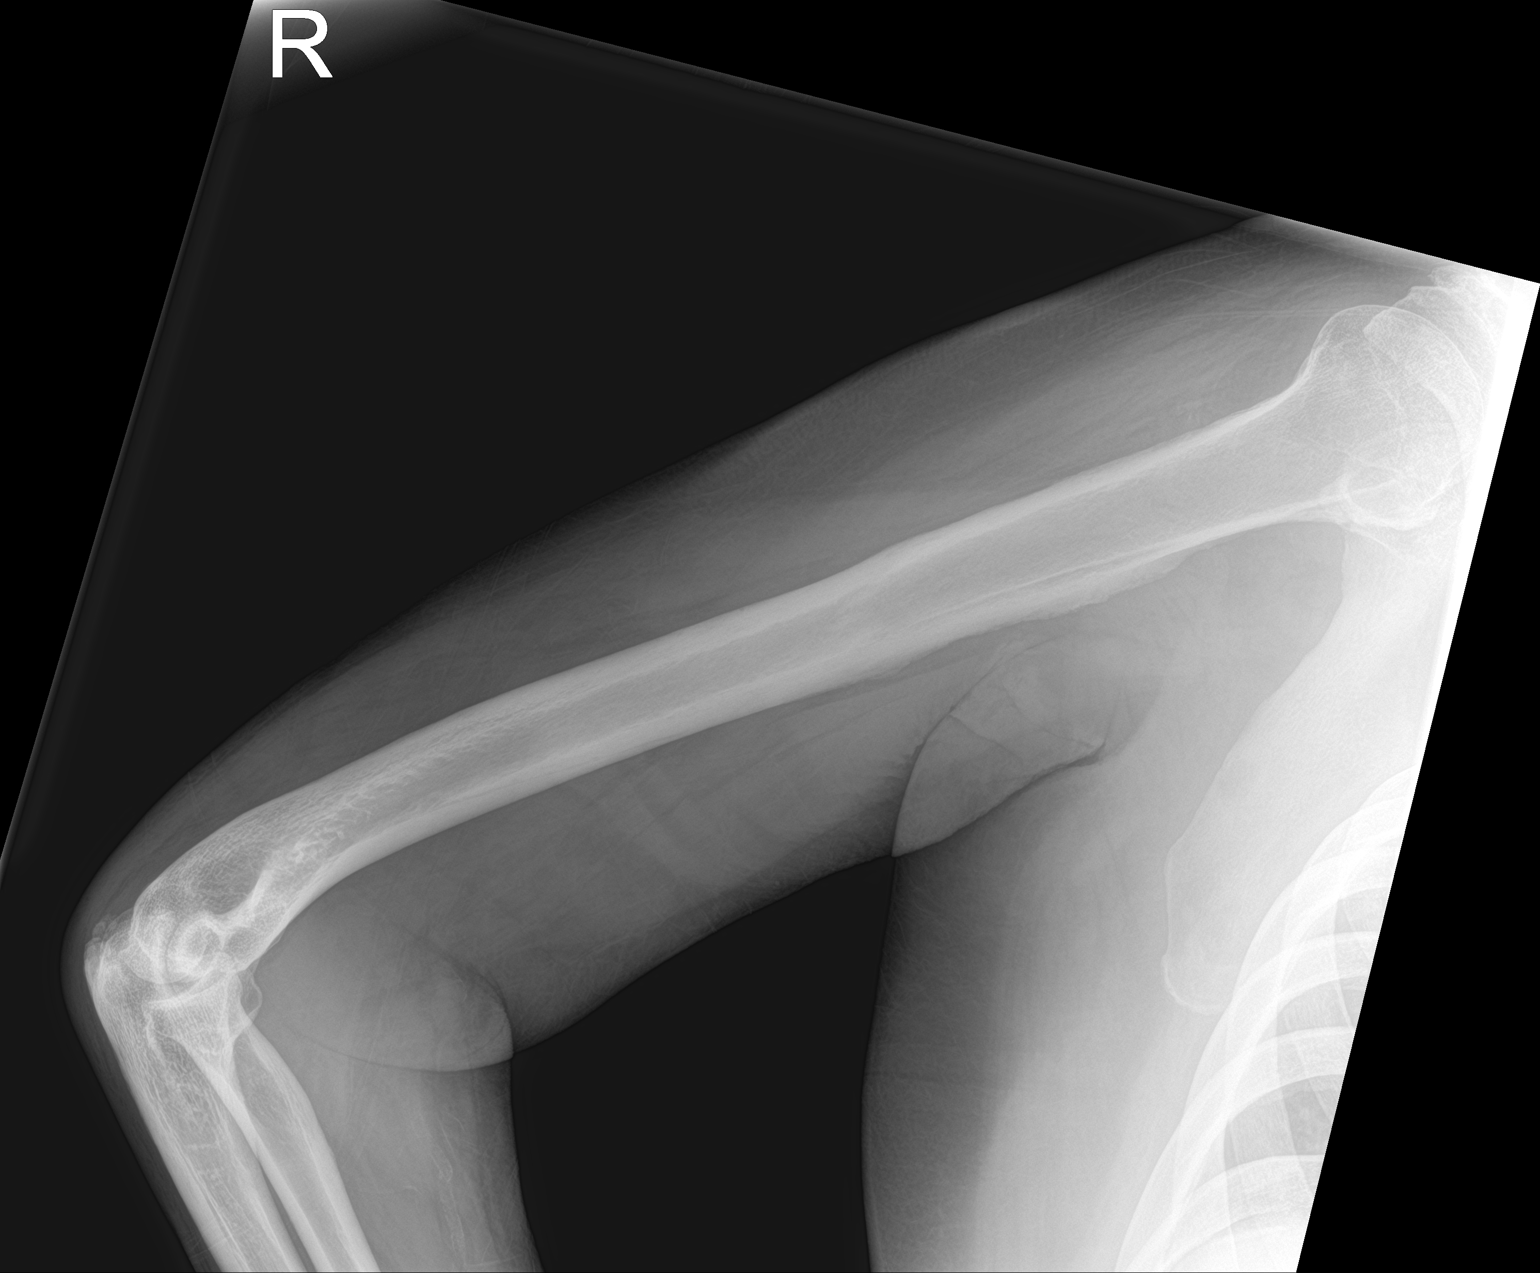

[2 of 2 positions shown; findings below may reference images not displayed]

FINDINGS: Bone mineralization is within normal limits. Preserved alignment at
the right shoulder and elbow. Moderately advanced degenerative
changes at the right shoulder, while the elbow appears within normal
limits. No acute osseous abnormality identified. Negative visible
right upper extremity soft tissues.
IMPRESSION: No acute osseous abnormality identified.

## 2021-01-08 IMAGING — CR DG SHOULDER 2+V*R*
3 series · 4 of 4 positions shown · non-contrast
Comparison: Chest radiograph [DATE].

CLINICAL DATA: 64-year-old male with pain.

EXAM:
RIGHT SHOULDER - 2+ VIEW

[shoulder grashey]
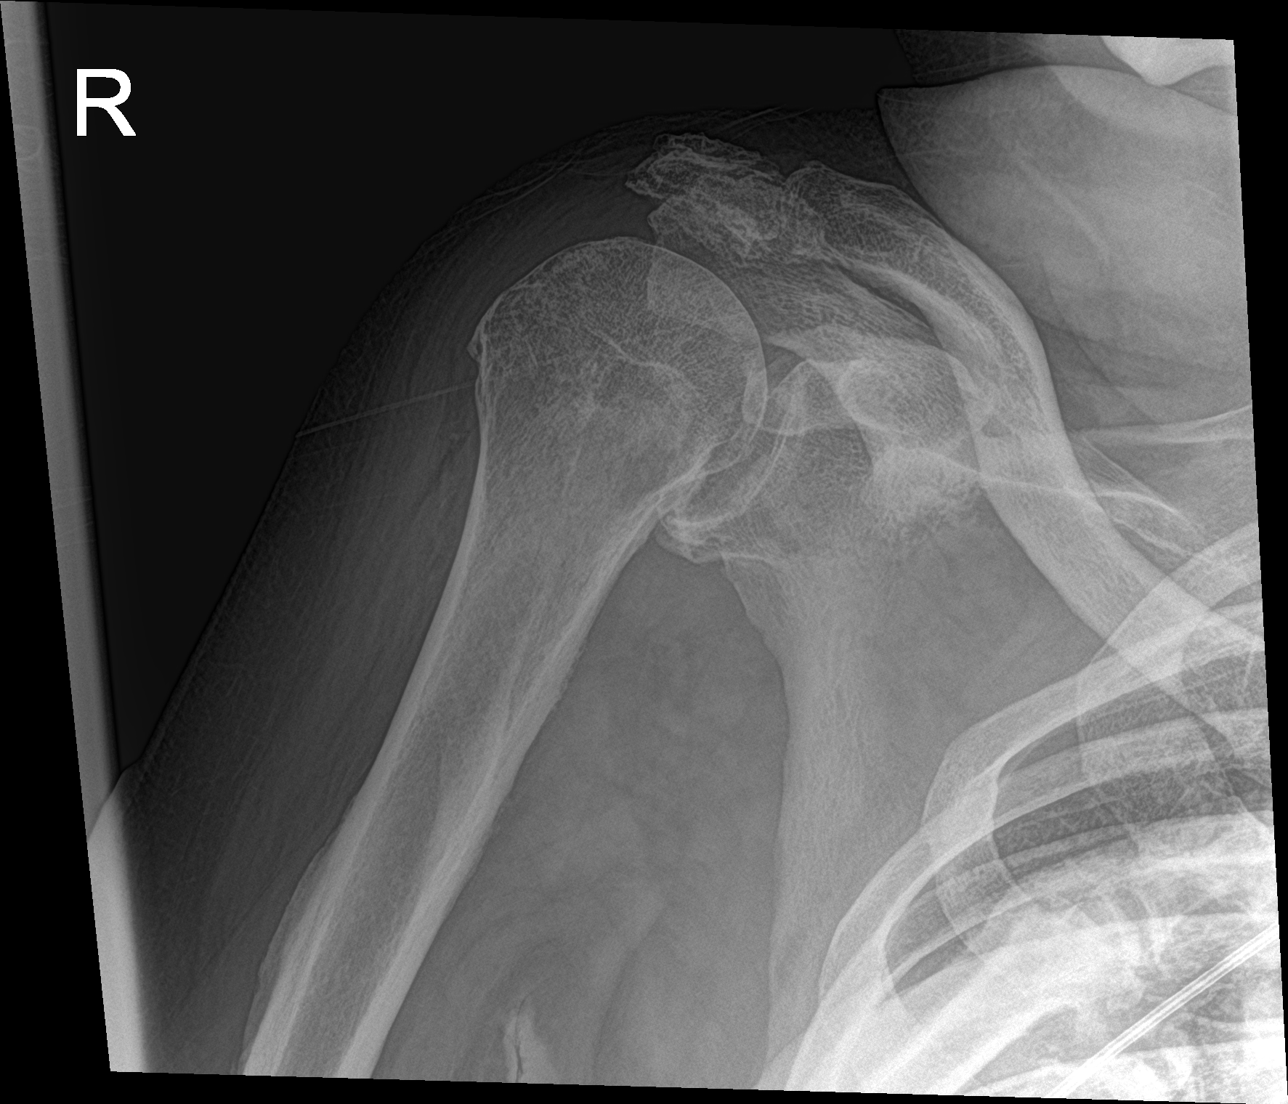

[Series 2: shoulder y view · 0.14mm/px · 2 of 2 slices shown]
[im 1/2]
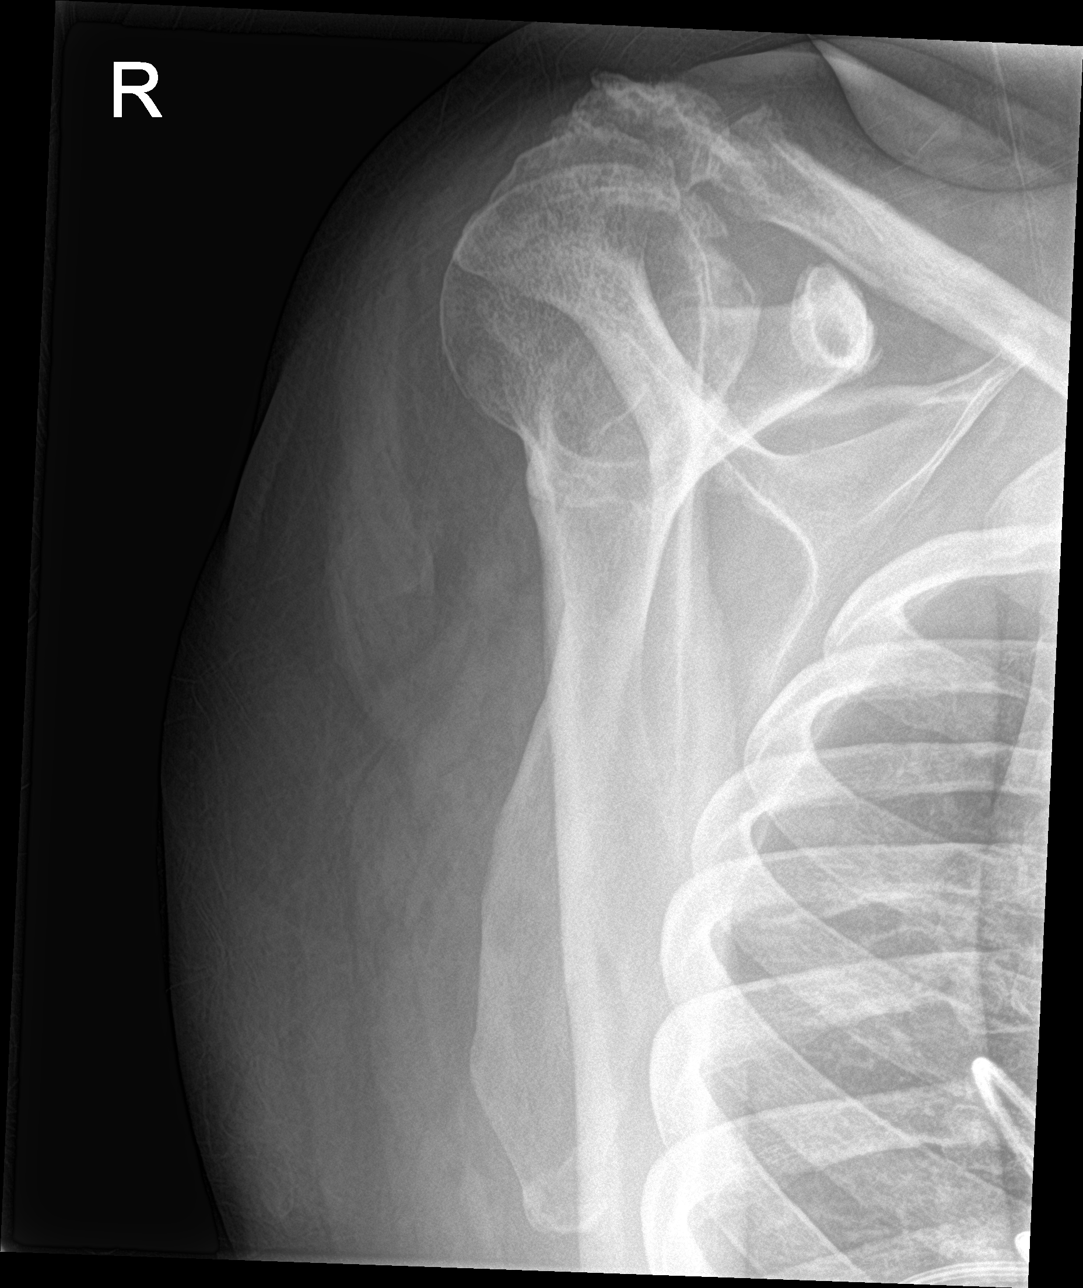
[im 2/2]
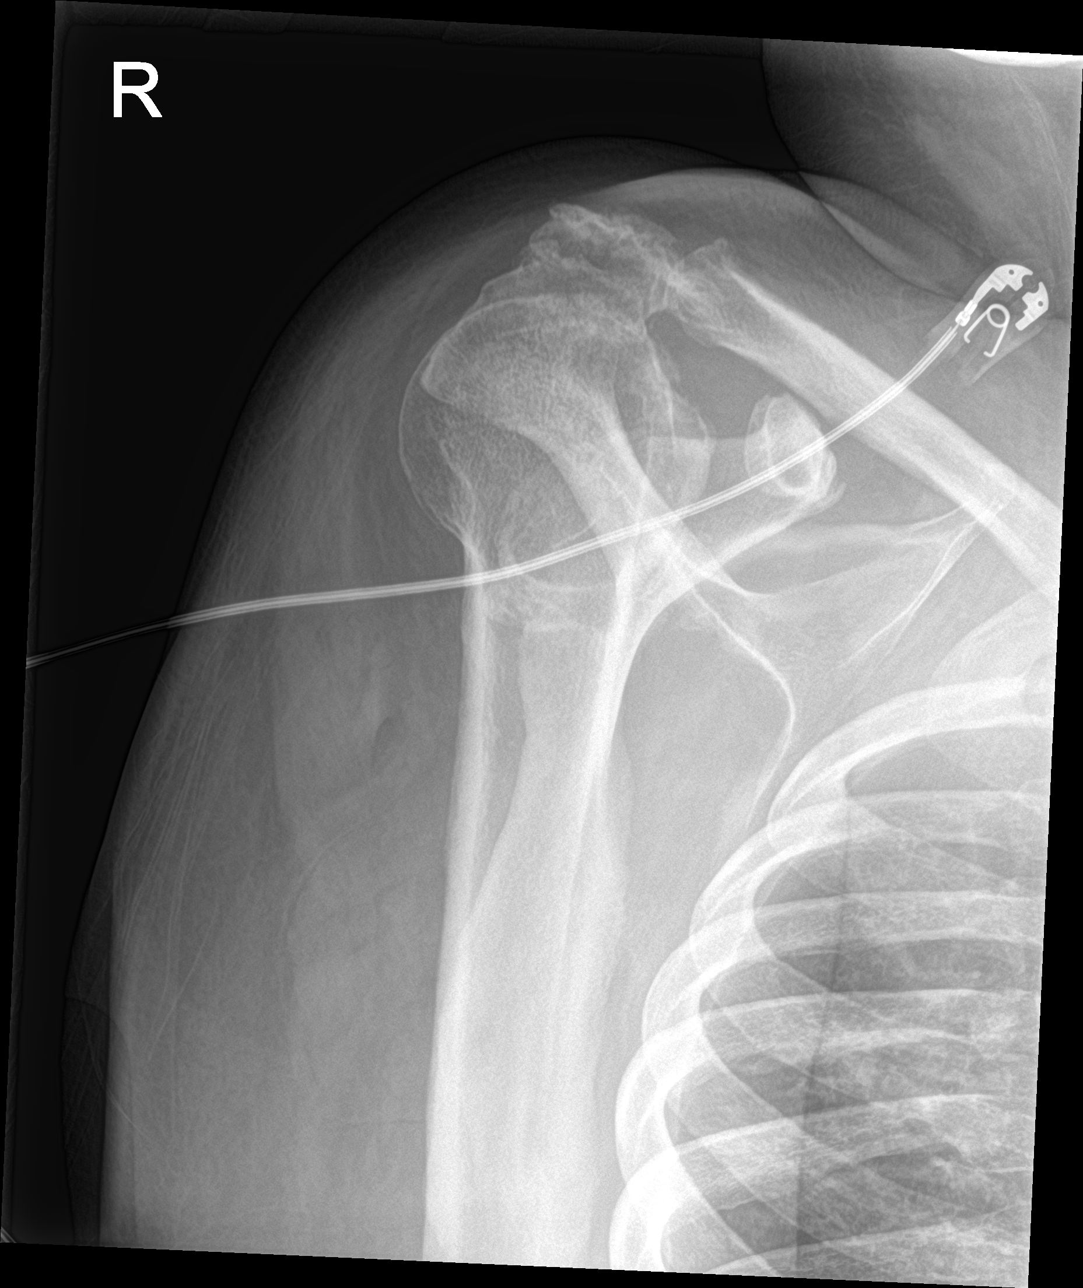

[shoulder ap neutral]
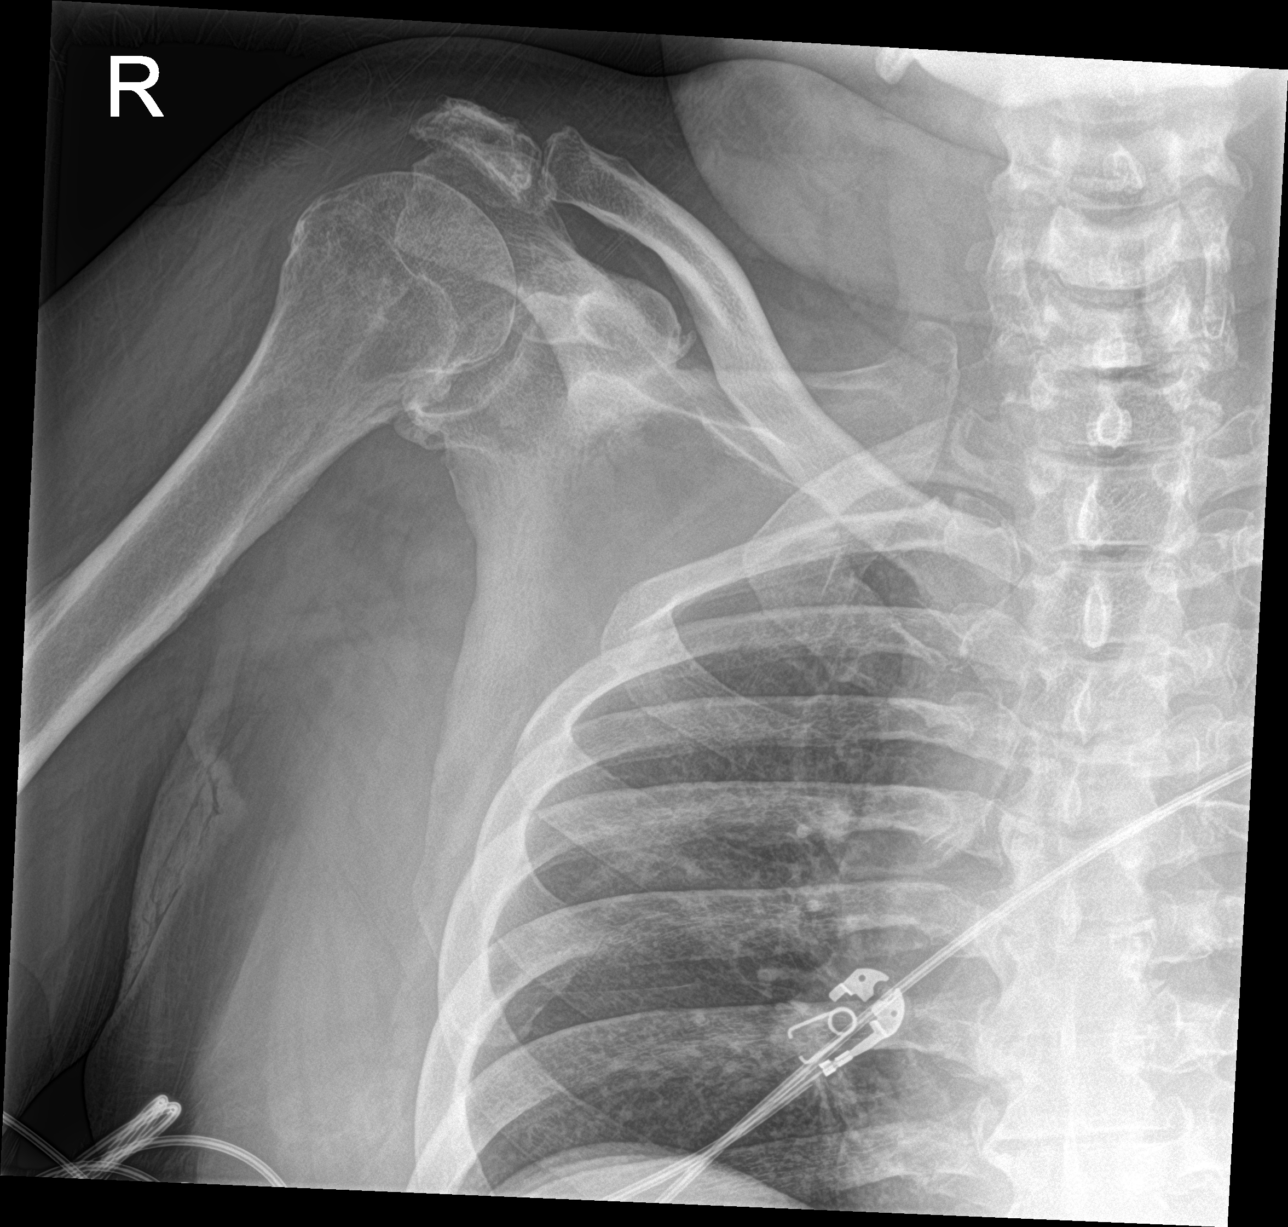

[4 of 4 positions shown; findings below may reference images not displayed]

FINDINGS: No glenohumeral joint dislocation. Proximal right humerus intact.
Bulky degenerative spurring at the glenoid, and also the acromion.
Possible os acromiale. Right clavicle and scapula appear intact. No
acute osseous abnormality identified. Stable and negative visible
right chest.
IMPRESSION: Degenerative changes with no acute osseous abnormality identified
about the right shoulder.

## 2021-01-08 IMAGING — CR DG FOREARM 2V*R*
2 series · 2 of 2 positions shown · non-contrast
Comparison: Right humerus and elbow series today.

CLINICAL DATA: 64-year-old male with pain.

EXAM:
RIGHT FOREARM - 2 VIEW

[forearm ap]
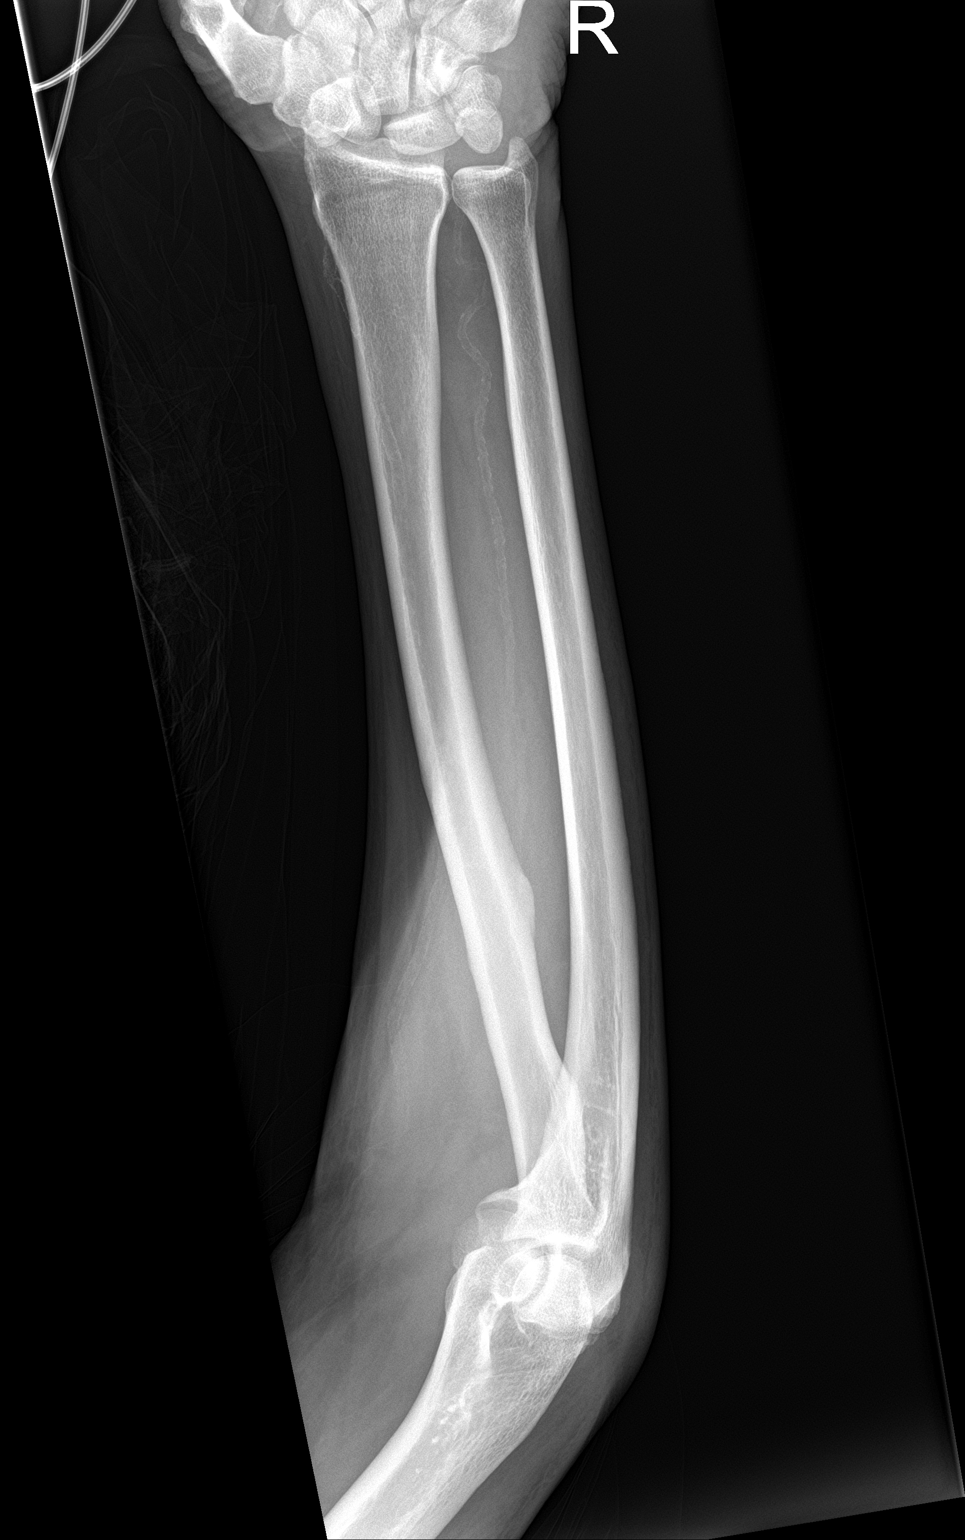

[forearm lat]
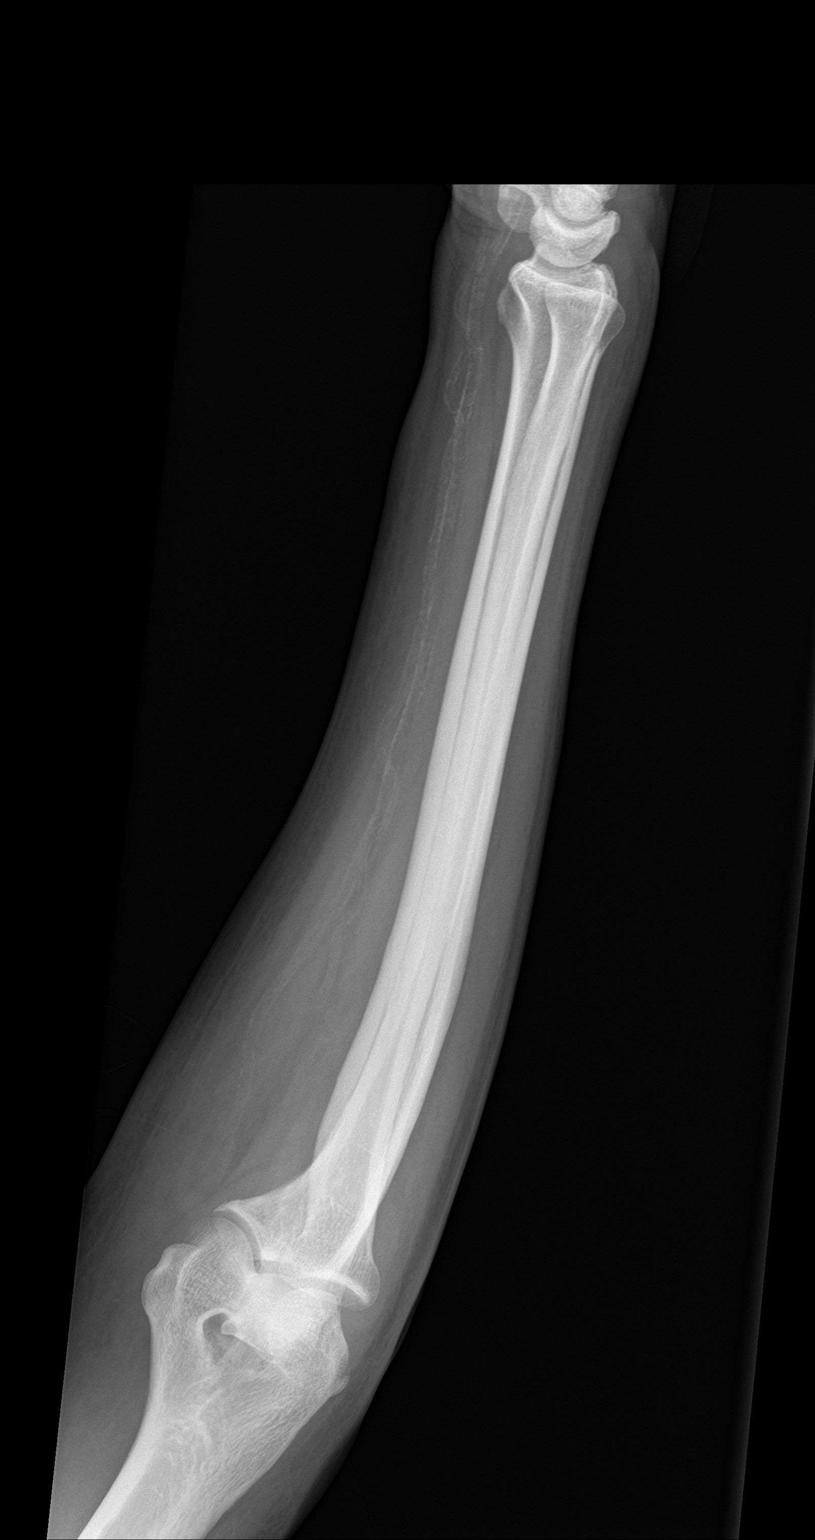

[2 of 2 positions shown; findings below may reference images not displayed]

FINDINGS: Bone mineralization is within normal limits. Right radius and ulna
appear intact. Alignment at the right wrist appears grossly
preserved. No acute osseous abnormality identified. Calcified
peripheral vascular disease in the forearm and wrist.
IMPRESSION: 1. No acute osseous abnormality identified in the right forearm.
2. Calcified peripheral vascular disease.

## 2021-01-08 IMAGING — CR DG CHEST 1V
1 series · 1 of 1 positions shown · non-contrast
Comparison: Portable chest [DATE] and earlier.

CLINICAL DATA: 64-year-old male is bed bound following prior
stroke. Pain.

EXAM:
CHEST  1 VIEW

[chest ap]
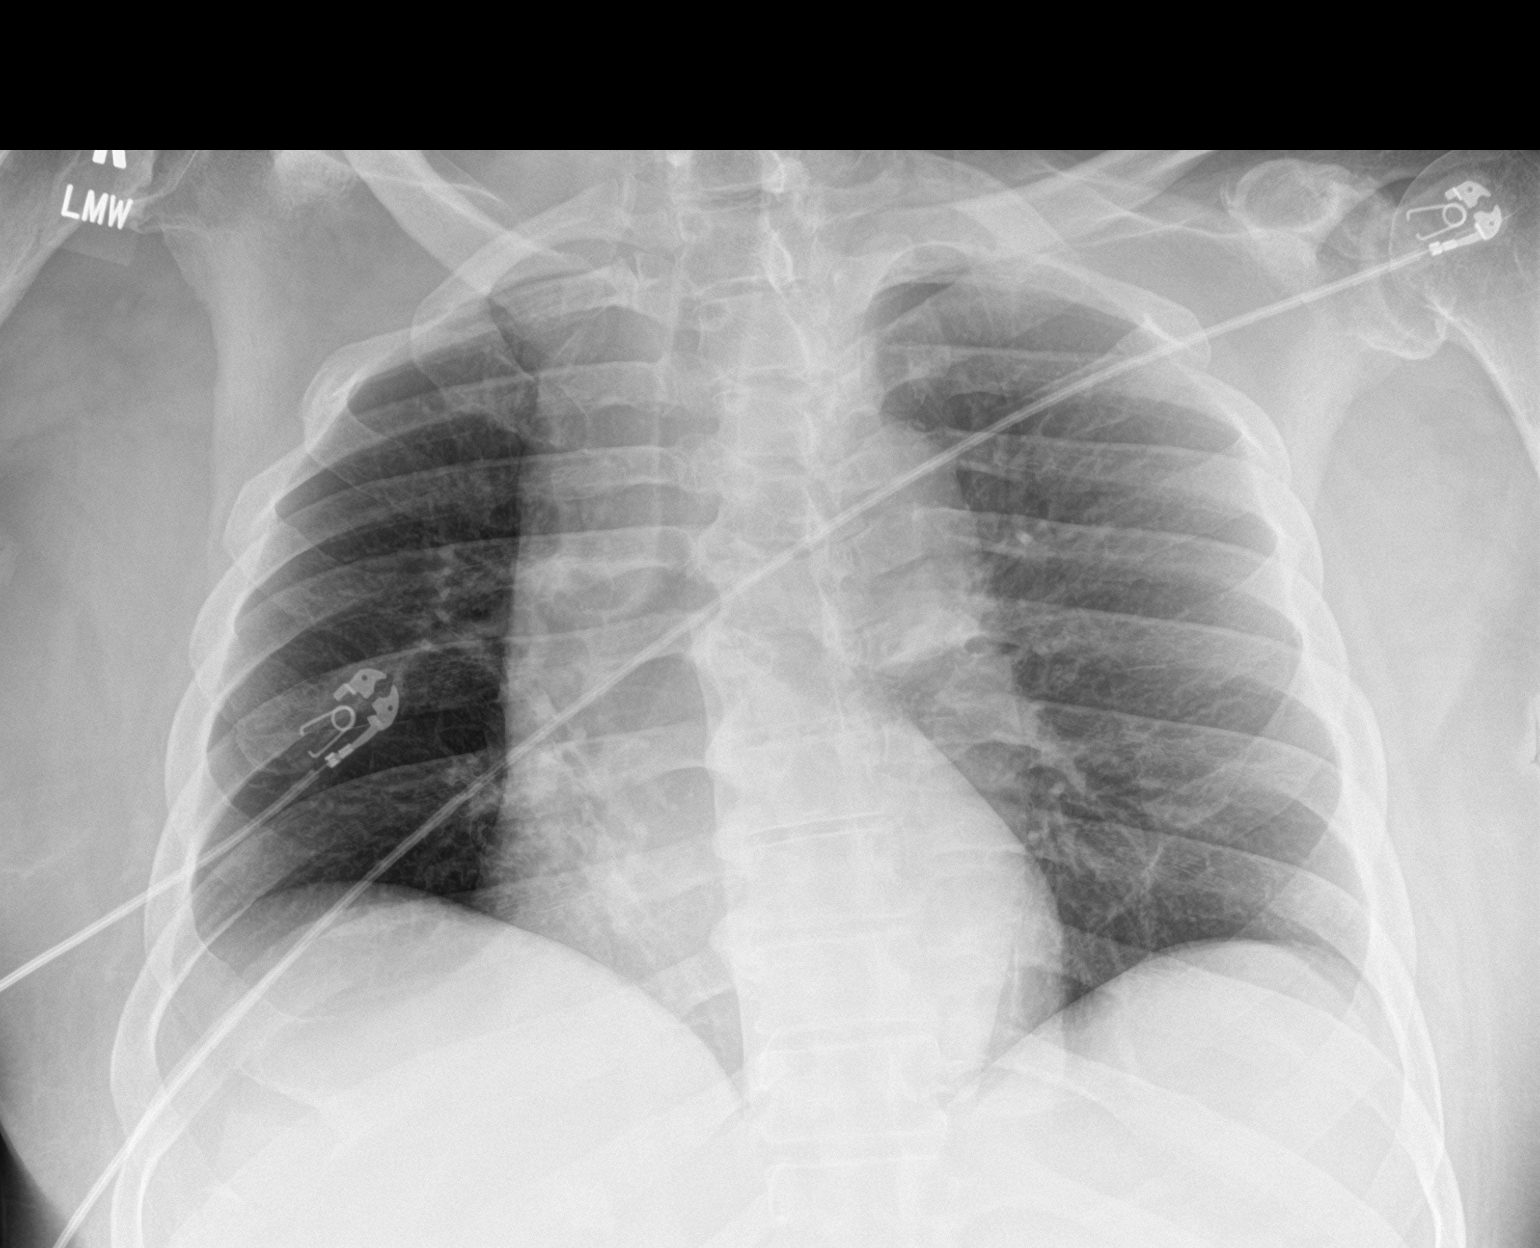

[1 of 1 positions shown; findings below may reference images not displayed]

FINDINGS: Portable AP supine view at [FX] hours. Rotated to the right. Stable
somewhat low lung volumes. Stable cardiac size and mediastinal
contours. Allowing for portable technique the lungs are clear.
Visualized tracheal air column is within normal limits. No acute
osseous abnormality identified.
IMPRESSION: No acute cardiopulmonary abnormality.

## 2021-01-08 IMAGING — CR DG HIP (WITH OR WITHOUT PELVIS) 2-3V*R*
3 series · 4 of 4 positions shown · non-contrast
Comparison: CT Abdomen and Pelvis [DATE].

CLINICAL DATA: 64-year-old male with pain.

EXAM:
DG HIP (WITH OR WITHOUT PELVIS) 2-3V RIGHT

[Series 1: pelvis ap · 0.14mm/px · 2 of 2 slices shown]
[im 1/2]
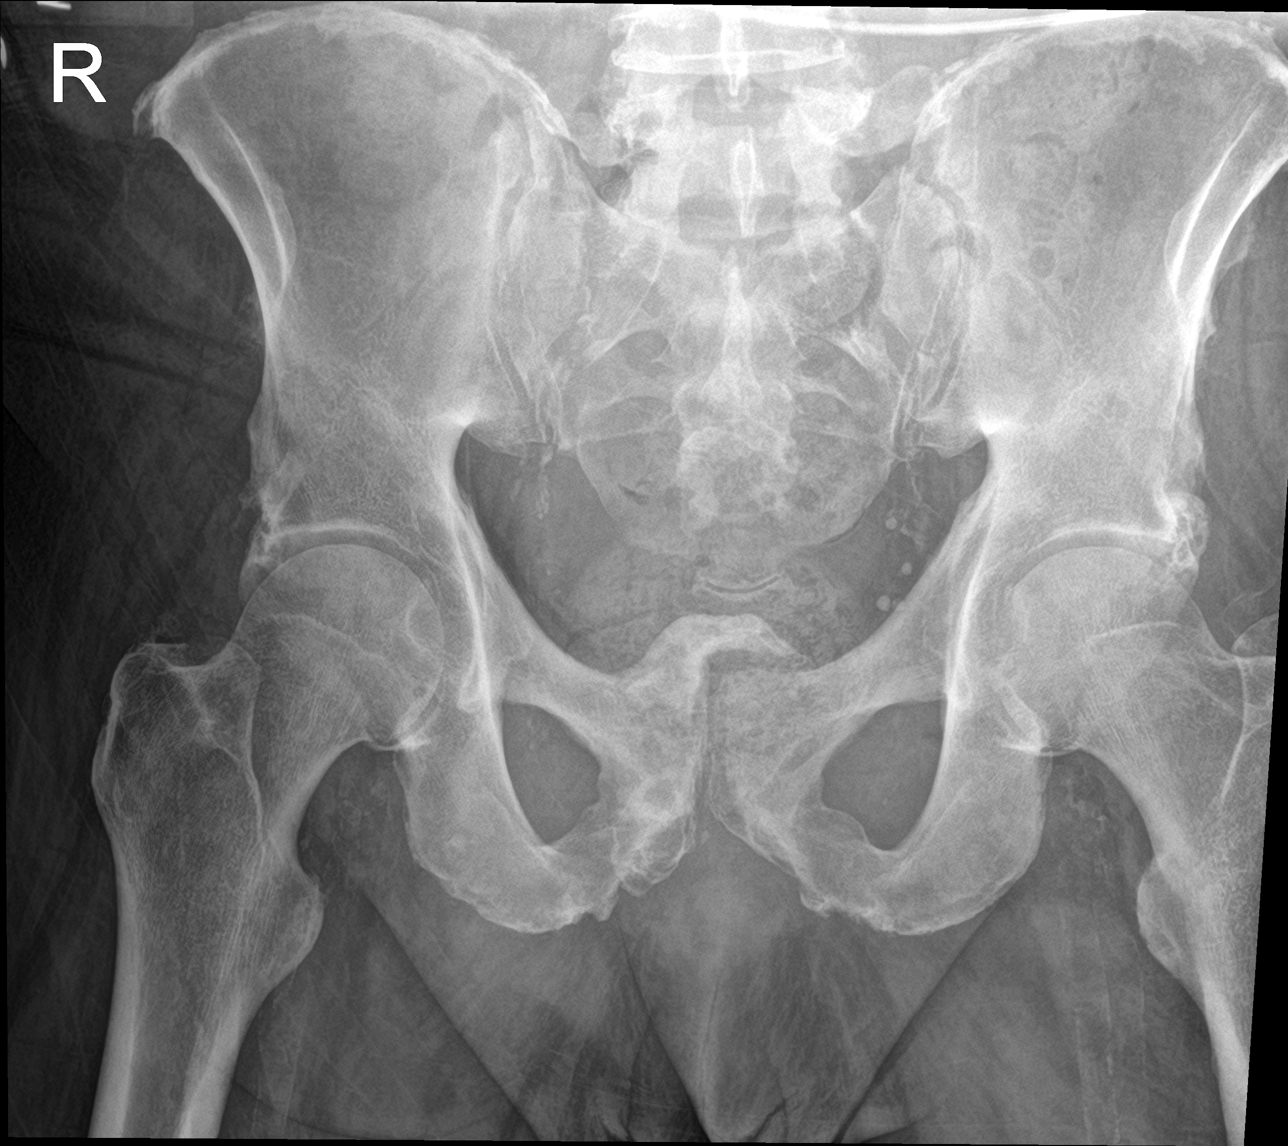
[im 2/2]
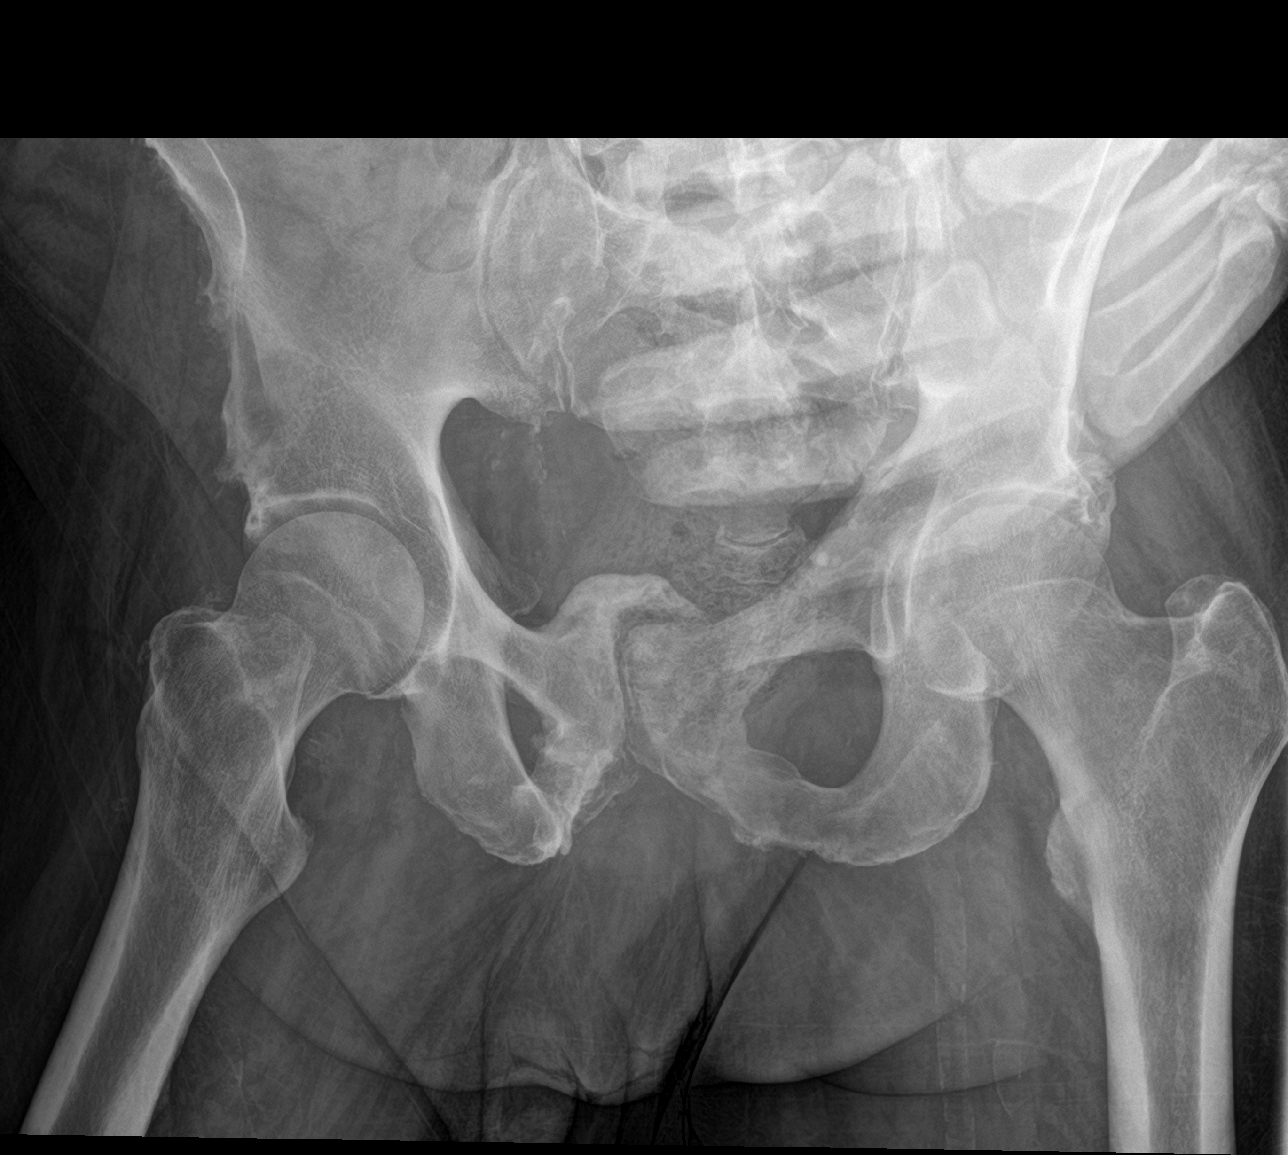

[hip ap]
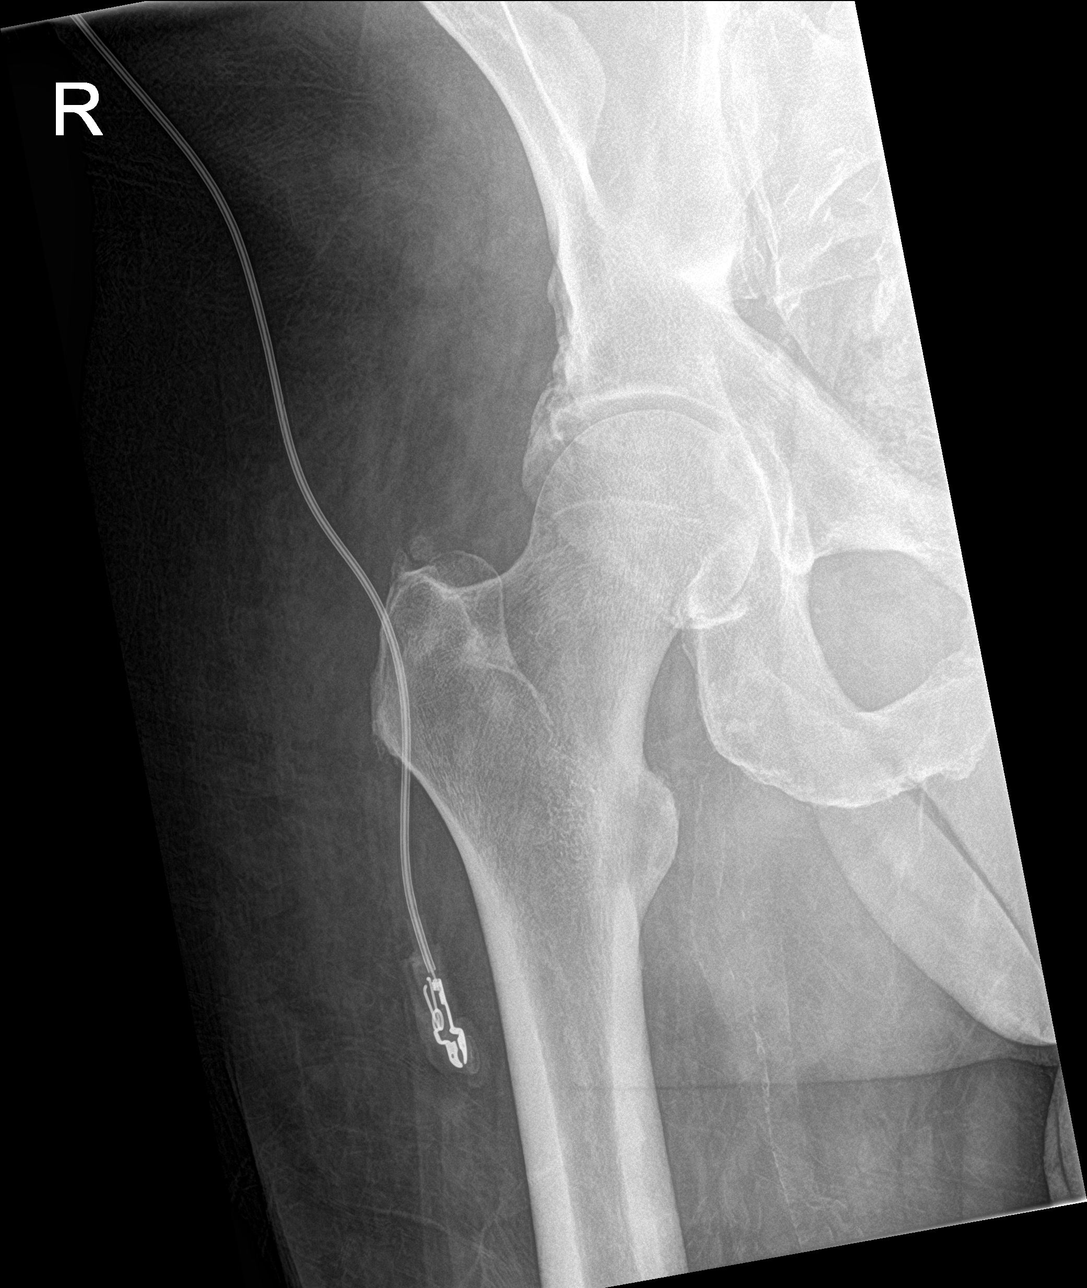

[hip lat]
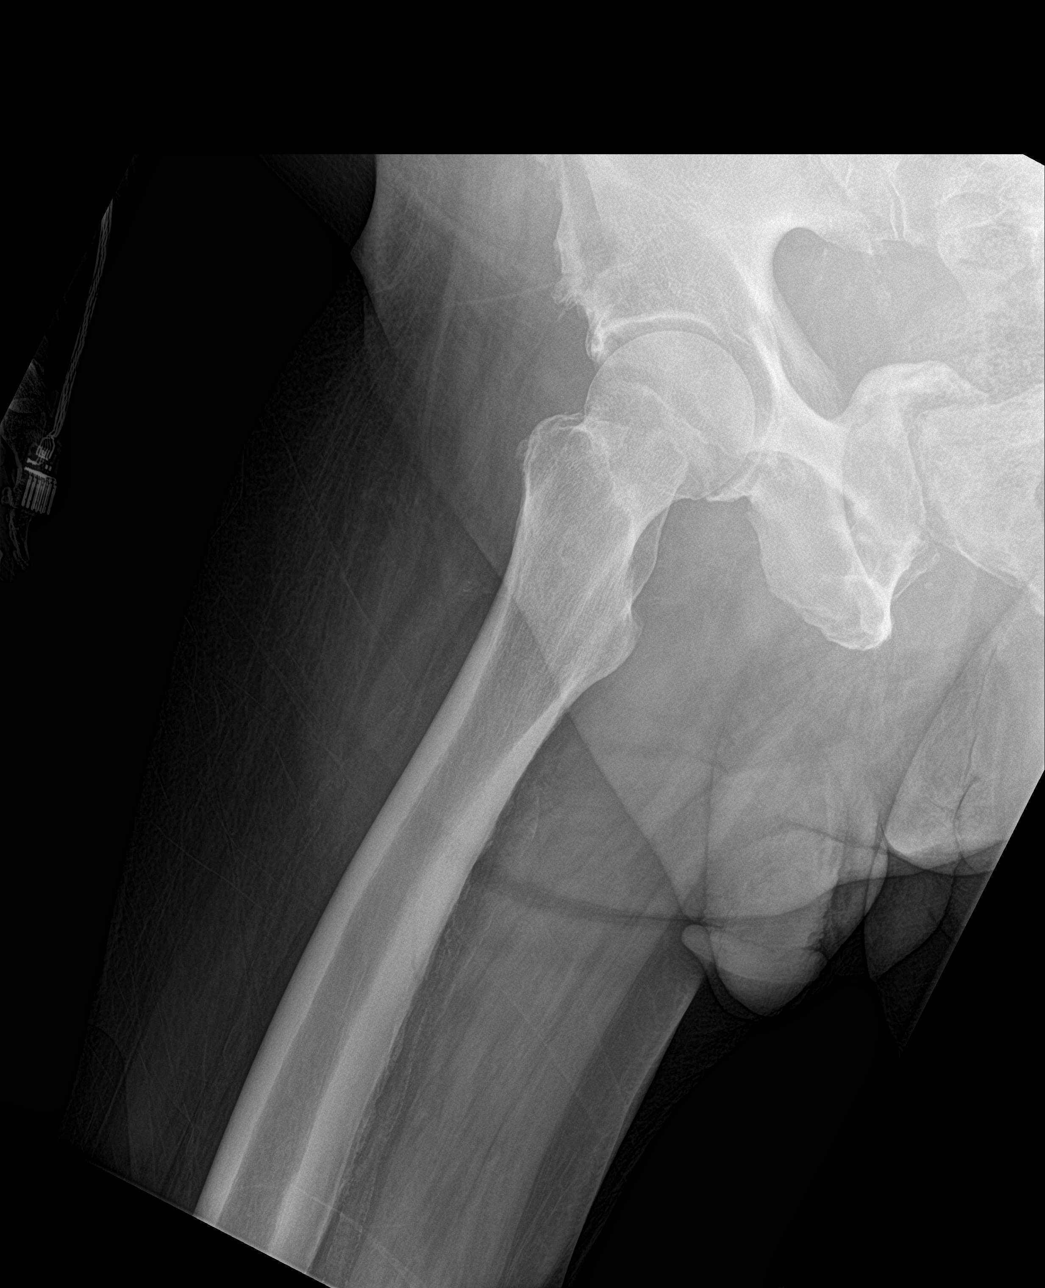

[4 of 4 positions shown; findings below may reference images not displayed]

FINDINGS: Bulky chronic posttraumatic and/or degenerative osteophytosis at the
pubic symphysis, and associated irregularity of the medial right
inferior pubic ramus, stable from the CT last month. Femoral heads
remain normally located. Normal background bone mineralization.
Bilateral acetabular degenerative spurring. Grossly intact proximal
left femur. Intact proximal right femur. SI joints appear symmetric.
No acute osseous abnormality identified. Pelvic vascular
calcifications. Paucity of bowel gas.
IMPRESSION: Bulky chronic osteophytosis at the pubic symphysis. No acute osseous
abnormality identified about the right hip or pelvis.

## 2021-01-08 NOTE — ED Notes (Signed)
[name] (Daughter) called and updated on pt care and transport back home

## 2021-01-08 NOTE — ED Provider Notes (Signed)
[facility] Emergency Department Provider Note  ____________________________________________  Time seen: Approximately 3:12 AM  I have reviewed the triage vital signs and the nursing notes.   HISTORY  Chief Complaint Arm Pain and Leg Pain  Level 5 caveat:  Portions of the history and physical were unable to be obtained due to nonverbal   HPI Raymond Kerr is a 65 y.o. male with a history of diabetes, hypertension, stroke complicated by right-sided hemiparesis and aphasia who presents from home for right-sided arm pain.  Patient is nonverbal and unable to provide any history.  There is no family with him.  I have called family multiple times and left messages but unable to reach anybody.  According to EMS patient was grimacing and moaning every time they try to move his right arm and leg today.  It is unclear if this is an acute problem for the patient.   Past Medical History:  Diagnosis Date   Diabetes mellitus without complication ([place])    GERD (gastroesophageal reflux disease)    Hypertension    Stroke [facility])     Patient Active Problem List   Diagnosis Date Noted   Anorexia 12/10/2020   Failure to thrive in adult    Malnutrition of moderate degree 12/06/2020   Decreased oral intake    Elevated liver function tests    Thrush    Benign prostatic hyperplasia without lower urinary tract symptoms    Bacteremia due to Staphylococcus 12/03/2020   Aphasia as late effect of cerebrovascular accident 12/03/2020   Diverticulitis 12/03/2020   Essential hypertension    Spastic hemiparesis of right dominant side as late effect of cerebrovascular disease ([place])    Uncontrolled type 2 diabetes mellitus with hyperglycemia ([place])    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   Hypotension 10/19/2020   Hypokalemia 10/17/2020   Hypomagnesemia 10/17/2020   Hypophosphatemia 10/17/2020   Pressure injury of skin 10/16/2020   Completed stroke ([place])    Acute stroke due  to occlusion of left cerebellar artery ([place]) 10/07/2020   Paralysis of left third cranial nerve    Right sided weakness    TIA (transient ischemic attack) 10/06/2020   Acute ischemic left ACA stroke ([place]) 10/06/2020   Microhematuria 12/10/2016   Elevated PSA 12/10/2016   Type 2 diabetes mellitus with hyperlipidemia ([place]) 11/28/2016   Benign essential hypertension 10/20/2015    Past Surgical History:  Procedure Laterality Date   TEE WITHOUT CARDIOVERSION N/A 10/08/2020   Procedure: TRANSESOPHAGEAL ECHOCARDIOGRAM (TEE);  Surgeon: Corey Skains, MD;  Location: [facility] ORS;  Service: Cardiovascular;  Laterality: N/A;    Prior to Admission medications   Medication Sig Start Date End Date Taking? Authorizing Provider  amLODipine (NORVASC) 10 MG tablet Take 1 tablet (10 mg total) by mouth daily. 11/24/20   Angiulli, Lavon Paganini, PA-C  aspirin 81 MG chewable tablet Chew 1 tablet (81 mg total) by mouth daily. 10/25/20   Florencia Reasons, MD  atorvastatin (LIPITOR) 80 MG tablet Take 1 tablet (80 mg total) by mouth daily. 11/24/20   Angiulli, Lavon Paganini, PA-C  B Complex Vitamins (VITAMIN B COMPLEX) TABS Take 1 tablet by mouth daily. 11/24/20   Angiulli, Lavon Paganini, PA-C  blood glucose meter kit and supplies Dispense based on patient and insurance preference. Use up to four times daily as directed. (FOR ICD-10 E10.9, E11.9). 11/24/20   Angiulli, Lavon Paganini, PA-C  Blood Glucose Monitoring Suppl (BLOOD GLUCOSE MONITOR SYSTEM) w/Device KIT Use as directed up to 4 times  daily. 11/24/20   Angiulli, Lavon Paganini, PA-C  clopidogrel (PLAVIX) 75 MG tablet Take 1 tablet (75 mg total) by mouth daily. 11/24/20   Angiulli, Lavon Paganini, PA-C  dronabinol (MARINOL) 5 MG capsule Take 1 capsule (5 mg total) by mouth 2 (two) times daily before lunch and supper. 12/12/20   Sharen Hones, MD  glimepiride (AMARYL) 2 MG tablet Take 1 tablet (2 mg total) by mouth daily with breakfast. 11/24/20   Angiulli, Lavon Paganini, PA-C  megestrol (MEGACE) 400 MG/10ML  suspension Take 10 mLs (400 mg total) by mouth daily. 12/13/20   Sharen Hones, MD  metFORMIN (GLUCOPHAGE) 500 MG tablet Take 1 tablet (500 mg total) by mouth 2 (two) times daily with a meal. 11/24/20   Angiulli, Lavon Paganini, PA-C  methylphenidate (RITALIN) 10 MG tablet Take 1 tablet (10 mg total) by mouth 2 (two) times daily with breakfast and lunch. 11/24/20   Angiulli, Lavon Paganini, PA-C  pantoprazole (PROTONIX) 40 MG tablet Take 1 tablet (40 mg total) by mouth every evening. 12/12/20   Sharen Hones, MD  pioglitazone (ACTOS) 30 MG tablet Take 1 tablet by mouth daily. 10/01/20   [provider]  polyethylene glycol (MIRALAX / GLYCOLAX) 17 g packet Take 17 g by mouth 2 (two) times daily. 11/24/20   Angiulli, Lavon Paganini, PA-C  tamsulosin (FLOMAX) 0.4 MG CAPS capsule Take 1 capsule (0.4 mg total) by mouth daily after supper. 11/24/20   Angiulli, Lavon Paganini, PA-C  traZODone (DESYREL) 50 MG tablet Take 0.5 tablets (25 mg total) by mouth at bedtime as needed for sleep. 11/24/20   Angiulli, Lavon Paganini, PA-C  Vitamin D, Ergocalciferol, (DRISDOL) 1.25 MG (50000 UNIT) CAPS capsule Take 1 capsule (50,000 Units total) by mouth once a week. 11/28/20   Angiulli, Lavon Paganini, PA-C    Allergies Patient has no known allergies.  Family History  Problem Relation Age of Onset   Prostate cancer Neg Hx    Bladder Cancer Neg Hx    Kidney cancer Neg Hx     Social History Social History   Tobacco Use   Smoking status: Never   Smokeless tobacco: Never  Substance Use Topics   Alcohol use: Yes   Drug use: No    Review of Systems  Constitutional: Negative for fever. Respiratory: Negative for shortness of breath. Gastrointestinal: Negative for vomiting or diarrhea. Musculoskeletal: + R arm and leg pain  Level 5 caveat:  Portions of the history and physical were unable to be obtained due to nonverbal  ____________________________________________   PHYSICAL EXAM:  VITAL SIGNS: ED Triage Vitals [01/07/21 1911]   Enc Vitals Group     BP (!) 151/86     Pulse Rate (!) 105     Resp 16     Temp 98.8 F (37.1 C)     Temp Source Oral     SpO2 99 %     Weight 229 lb 4.5 oz (104 kg)     Height 6' (1.829 m)     Head Circumference      Peak Flow      Pain Score      Pain Loc      Pain Edu?      Excl. in [place]?     Constitutional: Awake, non verbal HEENT:      Head: Normocephalic and atraumatic.         Eyes: Conjunctivae are normal. Sclera is non-icteric.       Mouth/Throat: Mucous membranes are dry.  Neck: Supple with no signs of meningismus. Cardiovascular: Regular rate and rhythm. No murmurs, gallops, or rubs. 2+ symmetrical distal pulses are present in all extremities. No JVD. Respiratory: Normal respiratory effort. Lungs are clear to auscultation bilaterally.  Gastrointestinal: Soft, non tender, and non distended. Musculoskeletal:  Patient seems very distressed with any movement of the RUE and R hip, he grimaces and moans when I try to move his R arm or R thigh. There is no obvious signs of trauma, deformities, erythema or warmth. The extremities are warm and well perfused No edema, cyanosis, or erythema of extremities. Neurologic: Nonverbal, R sided hemiparesis Skin: Skin is warm, dry and intact. No rash noted. Psychiatric: Mood and affect are normal. Speech and behavior are normal.  ____________________________________________   LABS (all labs ordered are listed, but only abnormal results are displayed)  Labs Reviewed  COMPREHENSIVE METABOLIC PANEL - Abnormal; Notable for the following components:      Result Value   CO2 19 (*)    Glucose, Bld 163 (*)    Albumin 3.2 (*)    AST 84 (*)    ALT 62 (*)    All other components within normal limits  CBC - Abnormal; Notable for the following components:   Hemoglobin 12.8 (*)    HCT 38.4 (*)    All other components within normal limits   ____________________________________________  EKG  ED ECG REPORT I, Rudene Re, the  attending physician, personally viewed and interpreted this ECG.  Sinus tachycardia with a rate of 104, normal intervals, normal axis, no ST elevations or depressions, anterior Q waves, diffuse T wave flattening.  Unchanged from prior from last month ____________________________________________  RADIOLOGY  I have personally reviewed the images performed during this visit and I agree with the Radiologist's read.   Interpretation by Radiologist:  DG Chest 1 View  Result Date: 01/08/2021 CLINICAL DATA:  65 year old male is bed bound following prior stroke. Pain. EXAM: CHEST  1 VIEW COMPARISON:  Portable chest 12/02/2020 and earlier. FINDINGS: Portable AP supine view at 0333 hours. Rotated to the right. Stable somewhat low lung volumes. Stable cardiac size and mediastinal contours. Allowing for portable technique the lungs are clear. Visualized tracheal air column is within normal limits. No acute osseous abnormality identified. IMPRESSION: No acute cardiopulmonary abnormality. Electronically Signed   By: Genevie Ann M.D.   On: 01/08/2021 04:26   DG Shoulder Right  Result Date: 01/08/2021 CLINICAL DATA:  65 year old male with pain. EXAM: RIGHT SHOULDER - 2+ VIEW COMPARISON:  Chest radiograph 12/02/2020. FINDINGS: No glenohumeral joint dislocation. Proximal right humerus intact. Bulky degenerative spurring at the glenoid, and also the acromion. Possible os acromiale. Right clavicle and scapula appear intact. No acute osseous abnormality identified. Stable and negative visible right chest. IMPRESSION: Degenerative changes with no acute osseous abnormality identified about the right shoulder. Electronically Signed   By: Genevie Ann M.D.   On: 01/08/2021 04:21   DG ELBOW COMPLETE RIGHT (3+VIEW)  Result Date: 01/08/2021 CLINICAL DATA:  65 year old male with pain. EXAM: RIGHT ELBOW - COMPLETE 3+ VIEW COMPARISON:  Right humerus series today. FINDINGS: Bone mineralization is within normal limits. Preserved joint  spaces and alignment. Mild for age degenerative spurring at the lateral epicondyle and olecranon. No evidence of joint effusion. No acute osseous abnormality identified. Calcified peripheral vascular disease in the forearm. IMPRESSION: 1. No acute osseous abnormality identified about the right elbow. 2. Calcified peripheral vascular disease. Electronically Signed   By: Genevie Ann M.D.   On:  01/08/2021 04:25   DG Forearm Right  Result Date: 01/08/2021 CLINICAL DATA:  65 year old male with pain. EXAM: RIGHT FOREARM - 2 VIEW COMPARISON:  Right humerus and elbow series today. FINDINGS: Bone mineralization is within normal limits. Right radius and ulna appear intact. Alignment at the right wrist appears grossly preserved. No acute osseous abnormality identified. Calcified peripheral vascular disease in the forearm and wrist. IMPRESSION: 1. No acute osseous abnormality identified in the right forearm. 2. Calcified peripheral vascular disease. Electronically Signed   By: Genevie Ann M.D.   On: 01/08/2021 04:27   DG Humerus Right  Result Date: 01/08/2021 CLINICAL DATA:  65 year old male with pain. EXAM: RIGHT HUMERUS - 2+ VIEW COMPARISON:  Right shoulder series today. FINDINGS: Bone mineralization is within normal limits. Preserved alignment at the right shoulder and elbow. Moderately advanced degenerative changes at the right shoulder, while the elbow appears within normal limits. No acute osseous abnormality identified. Negative visible right upper extremity soft tissues. IMPRESSION: No acute osseous abnormality identified. Electronically Signed   By: Genevie Ann M.D.   On: 01/08/2021 04:22   DG HIP UNILAT WITH PELVIS 2-3 VIEWS RIGHT  Result Date: 01/08/2021 CLINICAL DATA:  65 year old male with pain. EXAM: DG HIP (WITH OR WITHOUT PELVIS) 2-3V RIGHT COMPARISON:  CT Abdomen and Pelvis 12/02/2020. FINDINGS: Bulky chronic posttraumatic and/or degenerative osteophytosis at the pubic symphysis, and associated irregularity  of the medial right inferior pubic ramus, stable from the CT last month. Femoral heads remain normally located. Normal background bone mineralization. Bilateral acetabular degenerative spurring. Grossly intact proximal left femur. Intact proximal right femur. SI joints appear symmetric. No acute osseous abnormality identified. Pelvic vascular calcifications. Paucity of bowel gas. 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 No acute osseous abnormality identified about the right hip or pelvis. Electronically Signed   By: Genevie Ann M.D.   On: 01/08/2021 04:24     ____________________________________________   PROCEDURES  Procedure(s) performed:yes .1-3 Lead EKG Interpretation Performed by: Rudene Re, MD Authorized by: Rudene Re, MD     Interpretation: abnormal     ECG rate assessment: normal     Rhythm: sinus rhythm     Ectopy: none     Conduction: normal     Critical Care performed:  None ____________________________________________   INITIAL IMPRESSION / ASSESSMENT AND PLAN / ED COURSE  65 y.o. male with a history of diabetes, hypertension, stroke complicated by right-sided hemiparesis and aphasia who presents from home for right-sided arm pain.  There was no report of any fall or trauma.  Patient is unable to provide any history due to abnormal mentation and aphasia from an old stroke.  There is no family with him here.  Multiple attempts by me to contact the family over the phone were unsuccessful.  I did leave several messages.  The story from EMS is that patient was grimacing and moaning with any attempts to move his right upper and lower extremity today.  Patient does seem very bothered by any movement of the right arm and right hip.  There is no obvious signs of trauma, no deformities, no signs of cellulitis, no signs of ischemic limb.  X-ray of the entire right upper extremity and right hip/pelvis have been done and visualized by me with no acute  traumatic injury, confirmed by radiology.  Patient looks slightly dry on exam with dry mucous membranes but blood work is normal and unchanged from baseline.  We will continue to monitor patient as he were able  to get more history from family. I did review last admission note from 12/03/20 where it seems like patient was able to answer questions by shaking his head yes or no. Today, patient will look at me when I talk to him and maintain eye contact but he is not answering to any questions.   _________________________ 9:58 AM on 01/08/2021 ----------------------------------------- Several messages left for the daughter unsuccessfully.  I was able to speak with patient's other daughter who does not live with him and she will try to get a hold of her sister. We will continue to monitor until we can get family here for further history.  Care transferred to incoming MD      _____________________________________________ Please note:  Patient was evaluated in Emergency Department today for the symptoms described in the history of present illness. Patient was evaluated in the context of the global COVID-19 pandemic, which necessitated consideration that the patient might be at risk for infection with the SARS-CoV-2 virus that causes COVID-19. Institutional protocols and algorithms that pertain to the evaluation of patients at risk for COVID-19 are in a state of rapid change based on information released by regulatory bodies including the CDC and federal and state organizations. These policies and algorithms were followed during the patient's care in the ED.  Some ED evaluations and interventions may be delayed as a result of limited staffing during the pandemic.   [place] Controlled Substance Database was reviewed by me. ____________________________________________   FINAL CLINICAL IMPRESSION(S) / ED DIAGNOSES   Final diagnoses:  Right arm pain  Right hip pain      NEW MEDICATIONS STARTED DURING THIS  VISIT:  ED Discharge Orders     None        Note:  This document was prepared using Dragon voice recognition software and may include unintentional dictation errors.    Rudene Re, MD 01/08/21 [phone]

## 2021-01-08 NOTE — ED Notes (Signed)
Pt resting at this time with eyes open, pt nonverbal and due to triage and MD notes there has been no information for why the pt is here. Pt not able to speak at this time. NAD noted. VSS.

## 2021-01-08 NOTE — Discharge Instructions (Signed)
We suspect that the pain in the right arm and leg is due to muscle spasm and contraction.  Raymond Kerr should return to the emergency department for any new or worsening pain, swelling to the extremities, redness or abnormal warmth to the skin, fever, change in mental status, or any other new or worsening symptoms that concern you.  Follow-up with the primary care doctor.

## 2021-01-08 NOTE — ED Notes (Signed)
Called [facility] for transport to [address]  [place]  [address]

## 2021-01-08 NOTE — ED Provider Notes (Signed)
-----------------------------------------   8:43 AM on 01/08/2021 -----------------------------------------  I took over care of this patient from Dr. Don Perking.  The patient's daughter Raymond Kerr called back and confirmed that the reason that he was sent to the ED is because of right arm and leg pain that made it difficult for her to change the patient.  She states that the patient's ability to answer yes or no to questions "goes in and out" and the fact that he was unable to do so last night is not unusual for him.  She confirms that she can take him back.  On reassessment, the patient is alert and is now responding yes or no to questions.  I was able to squeeze and move his right arm and leg without difficulty or evidence of significant pain, and when I asked the patient if he was having pain he shook his head "no."  At this time, given the negative ED work-up, the patient is stable for discharge home.  I counseled the daughter on the results of the work-up and on return precautions, and she expressed understanding.   [name], MD 01/08/21 [phone]

## 2021-01-09 ENCOUNTER — Telehealth: Payer: Self-pay

## 2021-01-09 NOTE — Telephone Encounter (Signed)
Spoke with patient's daughter [name] and scheduled an in-person Palliative Consult for 01/20/21 @ 9AM.   COVID screening was negative. No pets in home. Patient lives with daughter.   Consent obtained; updated Outlook/Netsmart/Team List and Epic.   Family is aware they may be receiving a call from provider the day before or day of to confirm appointment.

## 2021-01-13 ENCOUNTER — Telehealth: Payer: Self-pay

## 2021-01-13 NOTE — Telephone Encounter (Signed)
Transition Care Management Unsuccessful Follow-up Telephone Call  Date of discharge and from where:  01/12/2021 from [facility]  Attempts:  1st Attempt  Reason for unsuccessful TCM follow-up call:  Left voice message

## 2021-01-14 NOTE — Telephone Encounter (Signed)
Transition Care Management Unsuccessful Follow-up Telephone Call  Date of discharge and from where:  01/12/2021 from [facility]  Attempts:  2nd Attempt  Reason for unsuccessful TCM follow-up call:  Left voice message

## 2021-01-16 NOTE — Telephone Encounter (Signed)
Transition Care Management Unsuccessful Follow-up Telephone Call  Date of discharge and from where:  01/12/2021-[facility]  Attempts:  3rd Attempt  Reason for unsuccessful TCM follow-up call:  Left voice message

## 2021-01-20 ENCOUNTER — Encounter: Payer: Self-pay | Admitting: Primary Care

## 2021-01-20 ENCOUNTER — Encounter
Payer: Medicaid Other | Attending: Physical Medicine and Rehabilitation | Admitting: Physical Medicine and Rehabilitation

## 2021-01-20 ENCOUNTER — Other Ambulatory Visit: Payer: Medicaid Other | Admitting: Primary Care

## 2021-01-20 ENCOUNTER — Other Ambulatory Visit: Payer: Self-pay

## 2021-01-20 DIAGNOSIS — E440 Moderate protein-calorie malnutrition: Secondary | ICD-10-CM

## 2021-01-20 DIAGNOSIS — R7989 Other specified abnormal findings of blood chemistry: Secondary | ICD-10-CM

## 2021-01-20 DIAGNOSIS — R627 Adult failure to thrive: Secondary | ICD-10-CM

## 2021-01-20 DIAGNOSIS — I69320 Aphasia following cerebral infarction: Secondary | ICD-10-CM

## 2021-01-20 DIAGNOSIS — I10 Essential (primary) hypertension: Secondary | ICD-10-CM

## 2021-01-20 DIAGNOSIS — R638 Other symptoms and signs concerning food and fluid intake: Secondary | ICD-10-CM

## 2021-01-20 DIAGNOSIS — Z515 Encounter for palliative care: Secondary | ICD-10-CM

## 2021-01-20 NOTE — Progress Notes (Signed)
[facility] Pall[facility] Care Consult Note Telephone: [phone]  Fax: [phone]   Date of encounter: 01/20/21 9:50 AM PATIENT NAME: Raymond Kerr [id number]   [phone] (home)  DOB: March 23, 1955 MRN: [id number] PRIMARY CARE PROVIDER:    Tracie Harrier, MD,  [address] [place] [address] [place] PROVIDER:   Tracie Harrier, [place],  [place] [address] [phone]  RESPONSIBLE PARTY:    Contact Information     Name Relation Home Work [place] Daughter [phone]  [phone]   Peery,Jasmin Daughter   [phone]   [facility] Daughter   [phone]       I met face to face with patient and family in  home. Palliative Care was asked to follow this patient by consultation request of  Tracie Harrier, MD to address advance care planning and complex medical decision making. This is the initial visit.                                     ASSESSMENT AND PLAN / RECOMMENDATIONS:   Advance Care Planning/Goals of Care: Goals include to maximize quality of life and symptom management. Patient/health care surrogate gave his/her permission to discuss.Our advance care planning conversation included a discussion about:    The value and importance of advance care planning  Experiences with loved ones who have been seriously ill or have died  Exploration of personal, cultural or spiritual beliefs that might influence medical decisions  Exploration of goals of care in the event of a sudden injury or illness  Identification  of a healthcare agent  -daughter Jordan Hawks is legal guardian Review and updating or creation of an  advance directive document . CODE STATUS:FULL MOST reviewed, wants to discuss with her siblings. Will complete on next visit.  Symptom Management/Plan:  DM: Family reports 40 lb wt loss,  A1C = 10.1% in 8/22.,  12 months ago 7.3.% Now POC stick = 158. He is no longer on hypoglycemics. Asked [name] to spot check several times a week to see how his glucose is running. Liver enzymes have been elevated in the past.  Pain: endorses R side, perhaps allodynia. Ordered tylenol 500 mg tid and sent gabapentin 100 mg bid po to start to see if this improves pain #60 no refill to [facility].  Nutrition; Affected by dysphagia. Dgt endorses pt pulled out 2 feeding tubes. Has lost significant weight, daughter estimates 40 lbs. She tries to feed orally but he does have dysphagia. She is not aware of ST recommendations. Albumin 3.2.  Caregiver strain: victoria is POA and doing a lot of the care. Her other sisters and her 20 year old son also help. HE must be transported by stretcher to MD appts. They do have a hoyer lift and w/c. We discussed medicaid PCS as well as LTC. I have asked SW Henrene Pastor to consult with me re services.  We discussed his insurance, he has medicare to start 02/22/21, as he did not sign up when he was 44. She does not know the plan and I referred her to [facility].  Currently no home health is seeing patient.   Follow up Palliative Care Visit: Palliative care will continue to follow for complex medical decision making, advance care planning, and clarification of goals. Return 3 weeks or  prn.  I spent 75 minutes providing this consultation. More than 50% of the time in this consultation was spent in counseling and care coordination.  PPS: 30%  HOSPICE ELIGIBILITY/DIAGNOSIS: TBD  Chief Complaint: debility  HISTORY OF PRESENT ILLNESS:  Raymond Kerr is a 65 y.o. year old male  with DM, HTN, CVA in 8/22 with R hemiplegia and aphasia .   History obtained from review of EMR, discussion with primary team, and interview with family, facility staff/caregiver and/or Mr. Hochstetler.  I reviewed available labs, medications, imaging, studies and related documents from the EMR.  Records  reviewed and summarized above.   ROS  General: NAD EYES: endorses vision changes ENMT: endorses dysphagia Cardiovascular: denies chest pain, denies DOE Pulmonary: endorses cough, denies increased SOB Abdomen: endorses good to fair  appetite, denies constipation, endorses incontinence of bowel GU: denies dysuria, endorses incontinence of urine MSK:  endorses  weakness,  no falls reported Skin: denies rashes or wounds Neurological: endorses R arm  and body pain, denies insomnia, endorses somnolence Psych: Endorses flat mood Heme/lymph/immuno: denies bruises, abnormal bleeding  Physical Exam: Current and past weights: 180 lbs Constitutional:151/75  HR 95  RR18  General: frail appearing, obese  EYES: anicteric sclera, lids intact, no discharge  ENMT: intact hearing, oral mucous membranes moist CV: S1S2, RRR, no LE edema Pulmonary: LCTA, no increased work of breathing, no cough, room air Abdomen: intake 50%, normo-active BS + 4 quadrants, soft and non tender, no ascites GU: deferred MSK: no sarcopenia, R hemiplegia,  non ambulatory Skin: warm and dry, no rashes or wounds on visible skin Neuro:  ++ generalized weakness,  ++ cognitive impairment Psych: non-anxious affect, A and O x 1-2 Hem/lymph/immuno: no widespread bruising CURRENT PROBLEM LIST:  Patient Active Problem List   Diagnosis Date Noted   Anorexia 12/10/2020   Failure to thrive in adult    Malnutrition of moderate degree 12/06/2020   Decreased oral intake    Elevated liver function tests    Thrush    Benign prostatic hyperplasia without lower urinary tract symptoms    Bacteremia due to Staphylococcus 12/03/2020   Aphasia as late effect of cerebrovascular accident 12/03/2020   Diverticulitis 12/03/2020   Essential hypertension    Spastic hemiparesis of right dominant side as late effect of cerebrovascular disease ([place])    Uncontrolled type 2 diabetes mellitus with hyperglycemia (HCC)    Left thalamic infarction  ([place]) 10/24/2020   Hypotension 10/19/2020   Hypokalemia 10/17/2020   Hypomagnesemia 10/17/2020   Hypophosphatemia 10/17/2020   Pressure injury of skin 10/16/2020   Completed stroke ([place])    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 ([place]) 10/07/2020   Paralysis of left third cranial nerve    Right sided weakness    TIA (transient ischemic attack) 10/06/2020   Acute ischemic left ACA stroke ([place]) 10/06/2020   Microhematuria 12/10/2016   Elevated PSA 12/10/2016   Type 2 diabetes mellitus with hyperlipidemia ([place]) 11/28/2016   Benign essential hypertension 10/20/2015   PAST MEDICAL HISTORY:  Active Ambulatory Problems    Diagnosis Date Noted   Microhematuria 12/10/2016   Elevated PSA 12/10/2016   Type 2 diabetes mellitus with hyperlipidemia ([place]) 11/28/2016   Benign essential hypertension 10/20/2015   TIA (transient ischemic attack) 10/06/2020   Acute ischemic left ACA stroke ([place]) 10/06/2020   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 ([place]) 10/07/2020   Paralysis of left third cranial nerve    Right sided weakness  Completed stroke ([place])    Pressure injury of skin 10/16/2020   Hypokalemia 10/17/2020   Hypomagnesemia 10/17/2020   Hypophosphatemia 10/17/2020   Hypotension 10/19/2020   Left thalamic infarction ([place]) 10/24/2020   Essential hypertension    Spastic hemiparesis of right dominant side as late effect of cerebrovascular disease ([place])    Uncontrolled type 2 diabetes mellitus with hyperglycemia ([place])    Bacteremia due to Staphylococcus 12/03/2020   Aphasia as late effect of cerebrovascular accident 12/03/2020   Diverticulitis 12/03/2020   Thrush    Benign prostatic hyperplasia without lower urinary tract symptoms    Elevated liver function tests    Malnutrition of moderate degree 12/06/2020   Decreased oral intake    Failure to thrive in adult    Anorexia 12/10/2020   Resolved Ambulatory Problems    Diagnosis Date Noted   No Resolved  Ambulatory Problems   Past Medical History:  Diagnosis Date   Diabetes mellitus without complication (HCC)    GERD (gastroesophageal reflux disease)    Hypertension    Stroke ([place])    SOCIAL HX:  Social History   Tobacco Use   Smoking status: Never   Smokeless tobacco: Never  Substance Use Topics   Alcohol use: Yes   FAMILY HX:  Family History  Problem Relation Age of Onset   Diabetes Mother    Diabetes Brother    Prostate cancer Neg Hx    Bladder Cancer Neg Hx    Kidney cancer Neg Hx       ALLERGIES: No Known Allergies   PERTINENT MEDICATIONS:  Outpatient Encounter Medications as of 01/20/2021  Medication Sig   amLODipine (NORVASC) 10 MG tablet Take 1 tablet (10 mg total) by mouth daily.   aspirin 81 MG chewable tablet Chew 1 tablet (81 mg total) by mouth daily.   atorvastatin (LIPITOR) 80 MG tablet Take 1 tablet (80 mg total) by mouth daily.   B Complex Vitamins (VITAMIN B COMPLEX) TABS Take 1 tablet by mouth daily.   blood glucose meter kit and supplies Dispense based on patient and insurance preference. Use up to four times daily as directed. (FOR ICD-10 E10.9, E11.9).   Blood Glucose Monitoring Suppl (BLOOD GLUCOSE MONITOR SYSTEM) w/Device KIT Use as directed up to 4 times daily.   clopidogrel (PLAVIX) 75 MG tablet Take 1 tablet (75 mg total) by mouth daily.   dronabinol (MARINOL) 5 MG capsule Take 1 capsule (5 mg total) by mouth 2 (two) times daily before lunch and supper.   glimepiride (AMARYL) 2 MG tablet Take 1 tablet (2 mg total) by mouth daily with breakfast.   megestrol (MEGACE) 400 MG/10ML suspension Take 10 mLs (400 mg total) by mouth daily.   metFORMIN (GLUCOPHAGE) 500 MG tablet Take 1 tablet (500 mg total) by mouth 2 (two) times daily with a meal.   methylphenidate (RITALIN) 10 MG tablet Take 1 tablet (10 mg total) by mouth 2 (two) times daily with breakfast and lunch.   pantoprazole (PROTONIX) 40 MG tablet Take 1 tablet (40 mg total) by mouth every  evening.   pioglitazone (ACTOS) 30 MG tablet Take 1 tablet by mouth daily.   polyethylene glycol (MIRALAX / GLYCOLAX) 17 g packet Take 17 g by mouth 2 (two) times daily.   tamsulosin (FLOMAX) 0.4 MG CAPS capsule Take 1 capsule (0.4 mg total) by mouth daily after supper.   traZODone (DESYREL) 50 MG tablet Take 0.5 tablets (25 mg total) by mouth at bedtime as needed for sleep.  Vitamin D, Ergocalciferol, (DRISDOL) 1.25 MG (50000 UNIT) CAPS capsule Take 1 capsule (50,000 Units total) by mouth once a week.   No facility-administered encounter medications on file as of 01/20/2021.   Thank you for the opportunity to participate in the care of Mr. Loberg.  The palliative care team will continue to follow. Please call our office at [phone] if we can be of additional assistance.   Jason Coop, NP , DNP, AGPCNP-BC  COVID-19 PATIENT SCREENING TOOL Asked and negative response unless otherwise noted:  Have you had symptoms of covid, tested positive or been in contact with someone with symptoms/positive test in the past 5-10 days?

## 2021-01-21 ENCOUNTER — Telehealth: Payer: Self-pay | Admitting: Primary Care

## 2021-01-21 NOTE — Telephone Encounter (Signed)
Spoke with [facility] social worker [employer] Technical sales engineer and outlined social needs. Patient does have for Medicaid so I will proceed with PCS application per family request. Meanwhile she will call POA and guardian [name] and discuss her goals for care for her father. We will follow up regarding these decisions in any additional applications for services.

## 2021-01-26 ENCOUNTER — Telehealth: Payer: Self-pay

## 2021-01-26 NOTE — Telephone Encounter (Signed)
01/26/21 @1 :40 PM: PC SW outreached patiens daughter, , to discuss and review patient needs.  Call unsuccessful. SW left HIPPA compliant VM with SW contact info. Awaiting return call.

## 2021-01-27 DIAGNOSIS — Z515 Encounter for palliative care: Secondary | ICD-10-CM

## 2021-01-27 NOTE — Progress Notes (Signed)
01/27/21 @ 0945 AM: PC SW outreached patients daughter, Raymond Kerr, to discuss patient needs and caregiver support resources.  Daughter shared that she is providing all care needs to patient at this time. Patient is bed bound. Daughter provides incontinence care, hygiene, bathing and feeding. Daughter stated that patient sleeps a lot during the day so patients needs are met during the small window he is awake. Patients appetite is poor. Daughter share that patient drinks ensure supplement drinks.   SW and daughter discussed PCS services for caregiver assistance, daughter declined the services at this time stating that she would like to think it about more first, due to daughter feeling as though she is meting patient basic needs at this time. SW explained PCS services in detail and the advantage of caregiver support. Daughter stated understanding.  SW and daughter discussed patients insurance and medicare taking effect Jan 1st. Patient has Medicaid. Daughter shared that patient has pain in R side. PC NP, K Tamala Julian is addressing pain with gabapentin, but daughter states that she does not think it is effective. Daughter also inquired about patient starting a blood thinner and OTC vitamins. SW will make NP aware and will review at next visit. Daughter shared that patient has neuro appt on 02/02/21 and wants to see if a referral can be made to a neuro closer to [place]. Daughter continues to be optimistic about patients recovery from his CVA as he now has feeling in his R side.  Daughter had not other concerns or questions for SW at this time. Daughter has SW contact info if needed.

## 2021-01-28 ENCOUNTER — Telehealth: Payer: Self-pay

## 2021-01-28 NOTE — Telephone Encounter (Signed)
01/28/21 @ 12 PM: PC SW outreached patients daughter, [name], to discuss home care providers.   Daughter shared she would like to have patient set up with a home care provider as it can be difficult to transport patient to doctors offices. SW and daughter talked about [facility]. Daughter is open to this service. SW provided [facility] contact info.  SW and daughter also broached the topic of Hospice. SW provided education on Hospice services and goal for patient. Daughter is not open to Hospice at this time, but appreciative of conversation.

## 2021-02-02 ENCOUNTER — Encounter: Payer: Self-pay | Admitting: Neurology

## 2021-02-02 ENCOUNTER — Ambulatory Visit: Payer: Self-pay | Admitting: Neurology

## 2021-02-03 DIAGNOSIS — Z515 Encounter for palliative care: Secondary | ICD-10-CM
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
## 2021-02-04 ENCOUNTER — Other Ambulatory Visit: Payer: Medicaid Other | Admitting: Primary Care

## 2021-02-04 ENCOUNTER — Other Ambulatory Visit: Payer: Self-pay

## 2021-02-04 DIAGNOSIS — L89892 Pressure ulcer of other site, stage 2: Secondary | ICD-10-CM

## 2021-02-04 DIAGNOSIS — R638 Other symptoms and signs concerning food and fluid intake: Secondary | ICD-10-CM

## 2021-02-04 DIAGNOSIS — R627 Adult failure to thrive: Secondary | ICD-10-CM

## 2021-02-04 DIAGNOSIS — E440 Moderate protein-calorie malnutrition: Secondary | ICD-10-CM

## 2021-02-04 DIAGNOSIS — Z515 Encounter for palliative care: Secondary | ICD-10-CM
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
## 2021-02-09 ENCOUNTER — Other Ambulatory Visit: Payer: Medicaid Other | Admitting: Primary Care

## 2021-02-19 ENCOUNTER — Encounter
Payer: Medicaid Other | Attending: Physical Medicine and Rehabilitation | Admitting: Physical Medicine and Rehabilitation

## 2021-03-25 DEATH — deceased

## 2021-04-06 ENCOUNTER — Ambulatory Visit: Payer: Medicaid Other | Admitting: Neurology
